# Patient Record
Sex: Female | Born: 1935 | Race: White | Hispanic: No | Smoking: Never smoker
Health system: Southern US, Community
[De-identification: ages and names within clinical notes are randomized; demographics above are authoritative.]

## PROBLEM LIST (undated history)

## (undated) DIAGNOSIS — K5790 Diverticulosis of intestine, part unspecified, without perforation or abscess without bleeding: Secondary | ICD-10-CM

## (undated) DIAGNOSIS — H269 Unspecified cataract: Secondary | ICD-10-CM

## (undated) DIAGNOSIS — E039 Hypothyroidism, unspecified: Secondary | ICD-10-CM

## (undated) DIAGNOSIS — J449 Chronic obstructive pulmonary disease, unspecified: Secondary | ICD-10-CM

## (undated) DIAGNOSIS — J849 Interstitial pulmonary disease, unspecified: Secondary | ICD-10-CM

## (undated) DIAGNOSIS — K219 Gastro-esophageal reflux disease without esophagitis: Secondary | ICD-10-CM

## (undated) DIAGNOSIS — M1990 Unspecified osteoarthritis, unspecified site: Secondary | ICD-10-CM

## (undated) DIAGNOSIS — R05 Cough: Secondary | ICD-10-CM

## (undated) DIAGNOSIS — K449 Diaphragmatic hernia without obstruction or gangrene: Secondary | ICD-10-CM

## (undated) DIAGNOSIS — K5792 Diverticulitis of intestine, part unspecified, without perforation or abscess without bleeding: Secondary | ICD-10-CM

## (undated) DIAGNOSIS — R053 Chronic cough: Secondary | ICD-10-CM

## (undated) DIAGNOSIS — K760 Fatty (change of) liver, not elsewhere classified: Secondary | ICD-10-CM

## (undated) DIAGNOSIS — E119 Type 2 diabetes mellitus without complications: Secondary | ICD-10-CM

## (undated) DIAGNOSIS — J8410 Pulmonary fibrosis, unspecified: Secondary | ICD-10-CM

## (undated) DIAGNOSIS — E079 Disorder of thyroid, unspecified: Secondary | ICD-10-CM

## (undated) DIAGNOSIS — R42 Dizziness and giddiness: Secondary | ICD-10-CM

## (undated) HISTORY — DX: Dizziness and giddiness: R42

## (undated) HISTORY — PX: REPLACEMENT TOTAL KNEE BILATERAL: SUR1225

## (undated) HISTORY — DX: Chronic obstructive pulmonary disease, unspecified: J44.9

## (undated) HISTORY — PX: SHOULDER SURGERY: SHX246

## (undated) HISTORY — PX: APPENDECTOMY: SHX54

## (undated) HISTORY — DX: Diverticulosis of intestine, part unspecified, without perforation or abscess without bleeding: K57.90

## (undated) HISTORY — DX: Fatty (change of) liver, not elsewhere classified: K76.0

## (undated) HISTORY — DX: Diverticulitis of intestine, part unspecified, without perforation or abscess without bleeding: K57.92

## (undated) HISTORY — DX: Diaphragmatic hernia without obstruction or gangrene: K44.9

---

## 1995-02-05 HISTORY — PX: INTRAOCULAR LENS INSERTION: SHX110

## 2004-05-01 ENCOUNTER — Emergency Department (HOSPITAL_COMMUNITY): Admission: EM | Admit: 2004-05-01 | Discharge: 2004-05-02 | Payer: Self-pay | Admitting: Emergency Medicine

## 2005-05-28 IMAGING — CR DG FOREARM 2V*R*
2 series · 2 of 2 positions shown · non-contrast
Comparison: none

CLINICAL DATA: fall
 RIGHT [id number] VIEWS:

[view not recorded (1 of 2)]
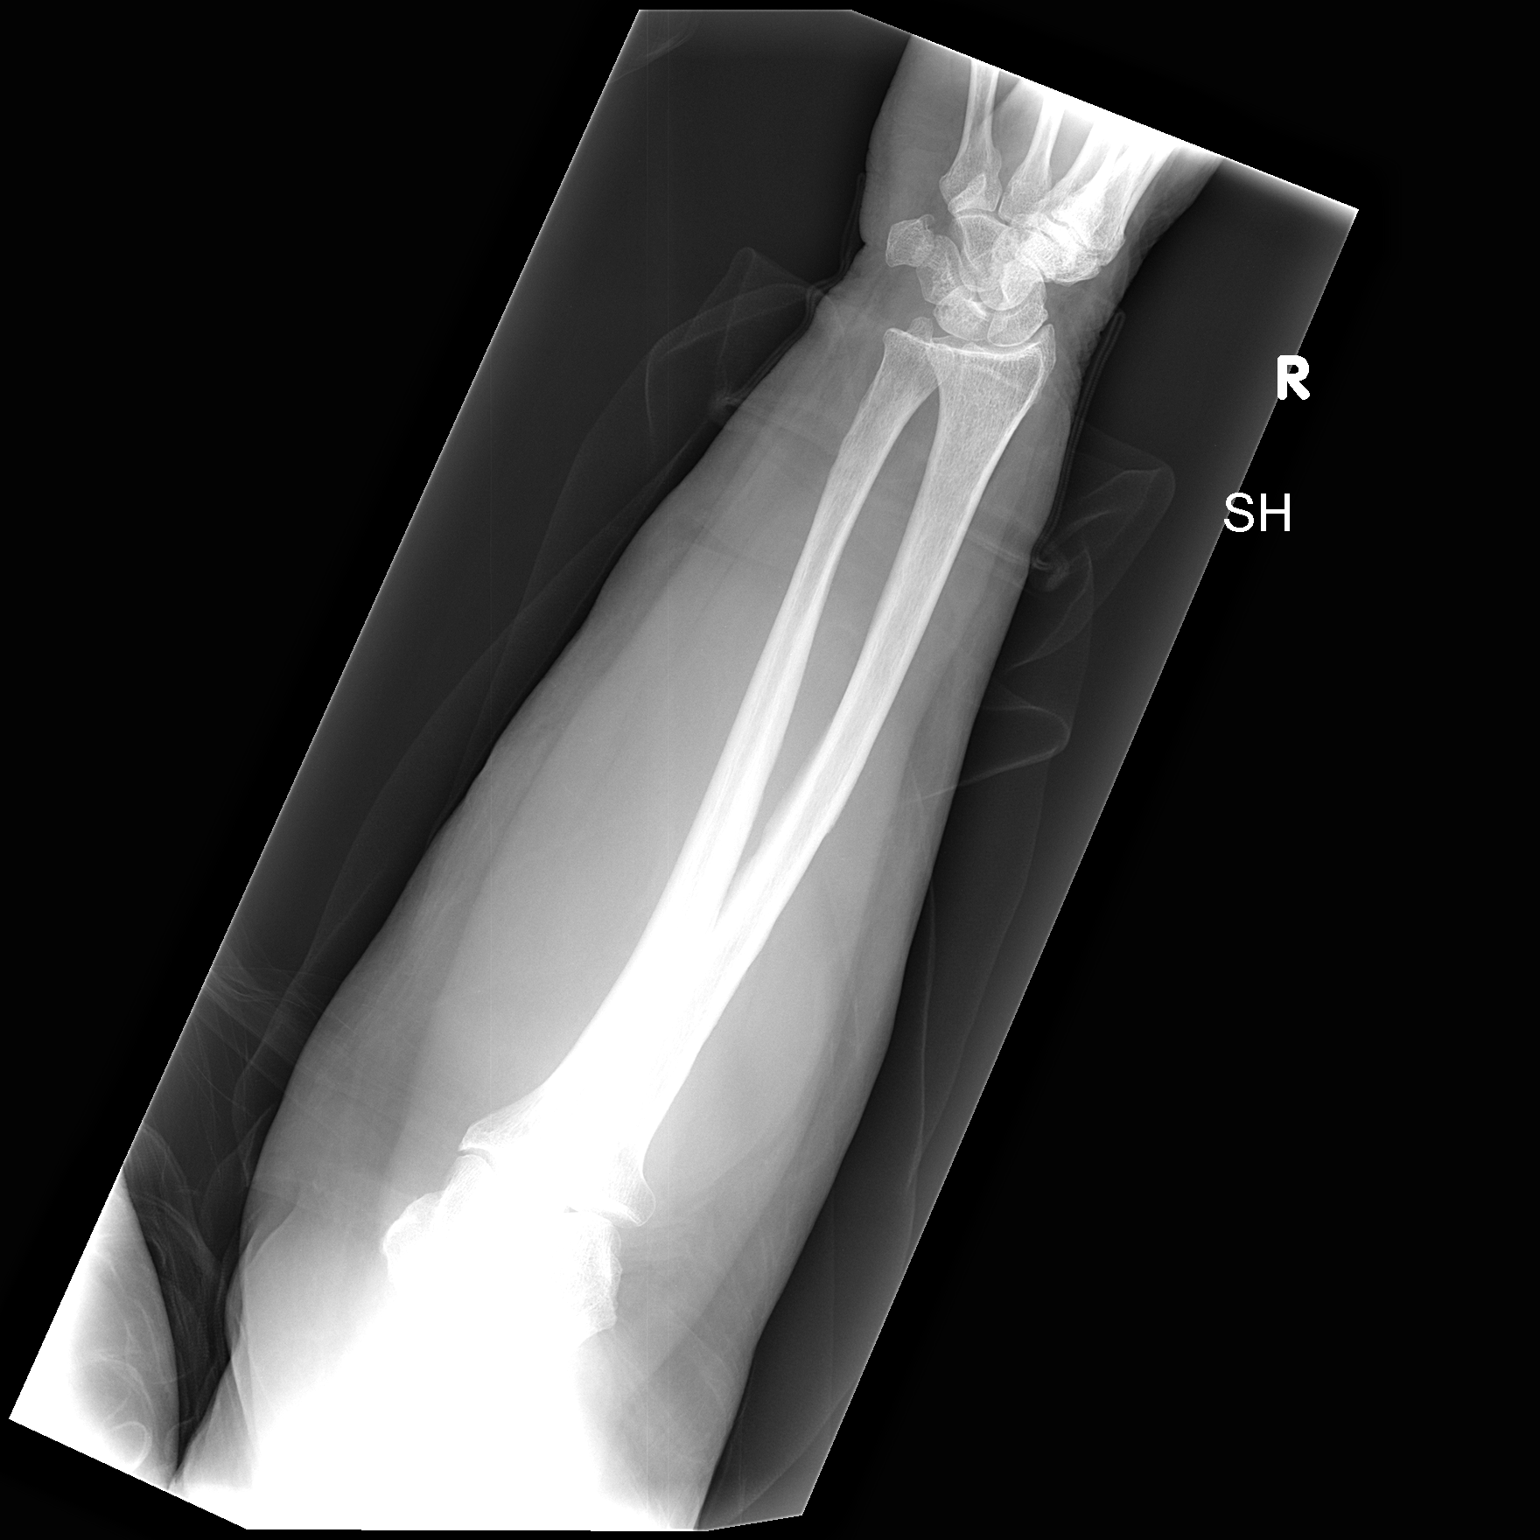

[view not recorded (2 of 2)]
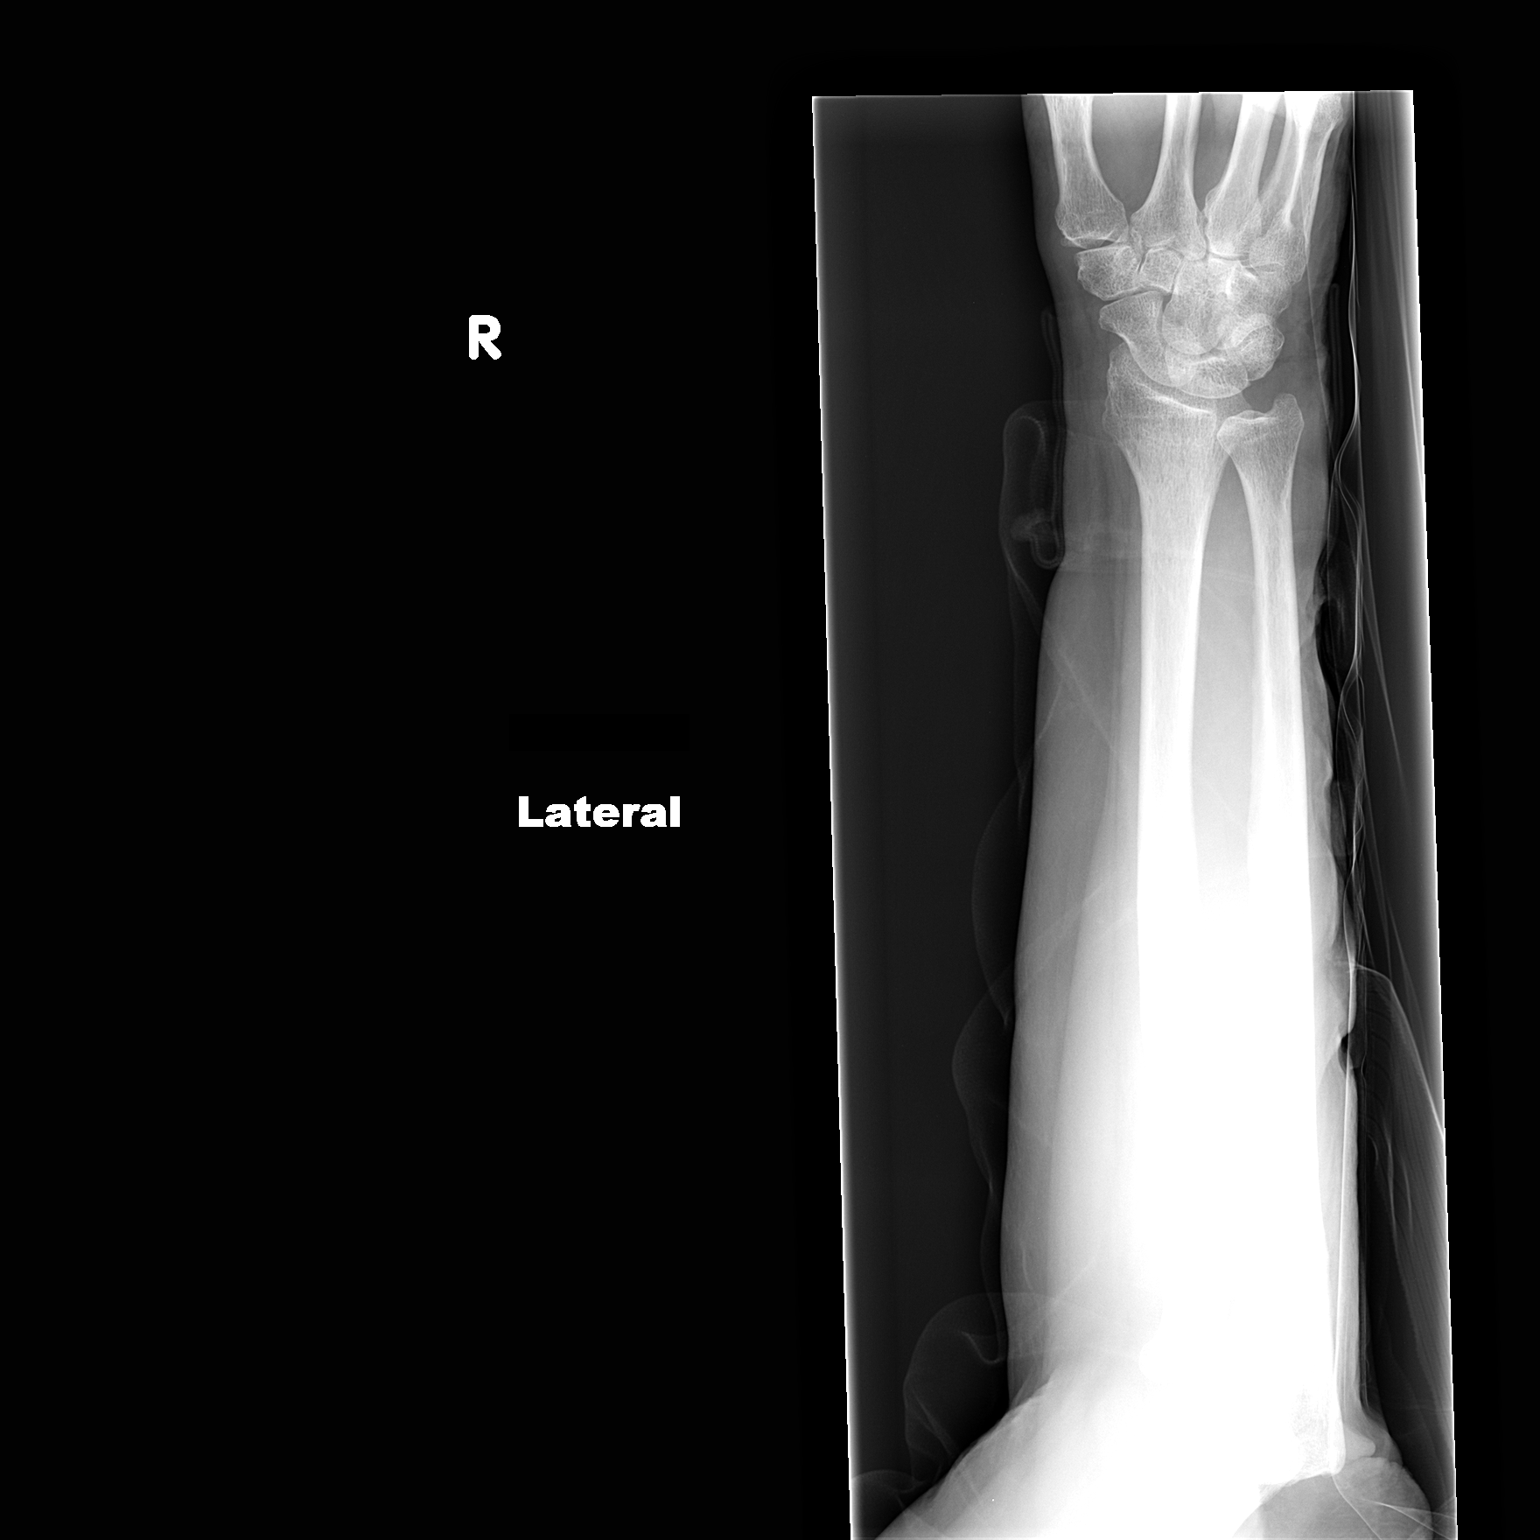

[2 of 2 positions shown; findings below may reference images not displayed]

FINDINGS: No acute fractures or dislocations are seen.
IMPRESSION: No acute fractures or dislocations are seen.

## 2005-05-28 IMAGING — CR DG HUMERUS 2V *R*
2 series · 2 of 2 positions shown · non-contrast
Comparison: none

CLINICAL DATA: Fall.  
 RIGHT HUMERUS ? 2 VIEW ([DATE] HOURS):

[view not recorded (1 of 2)]
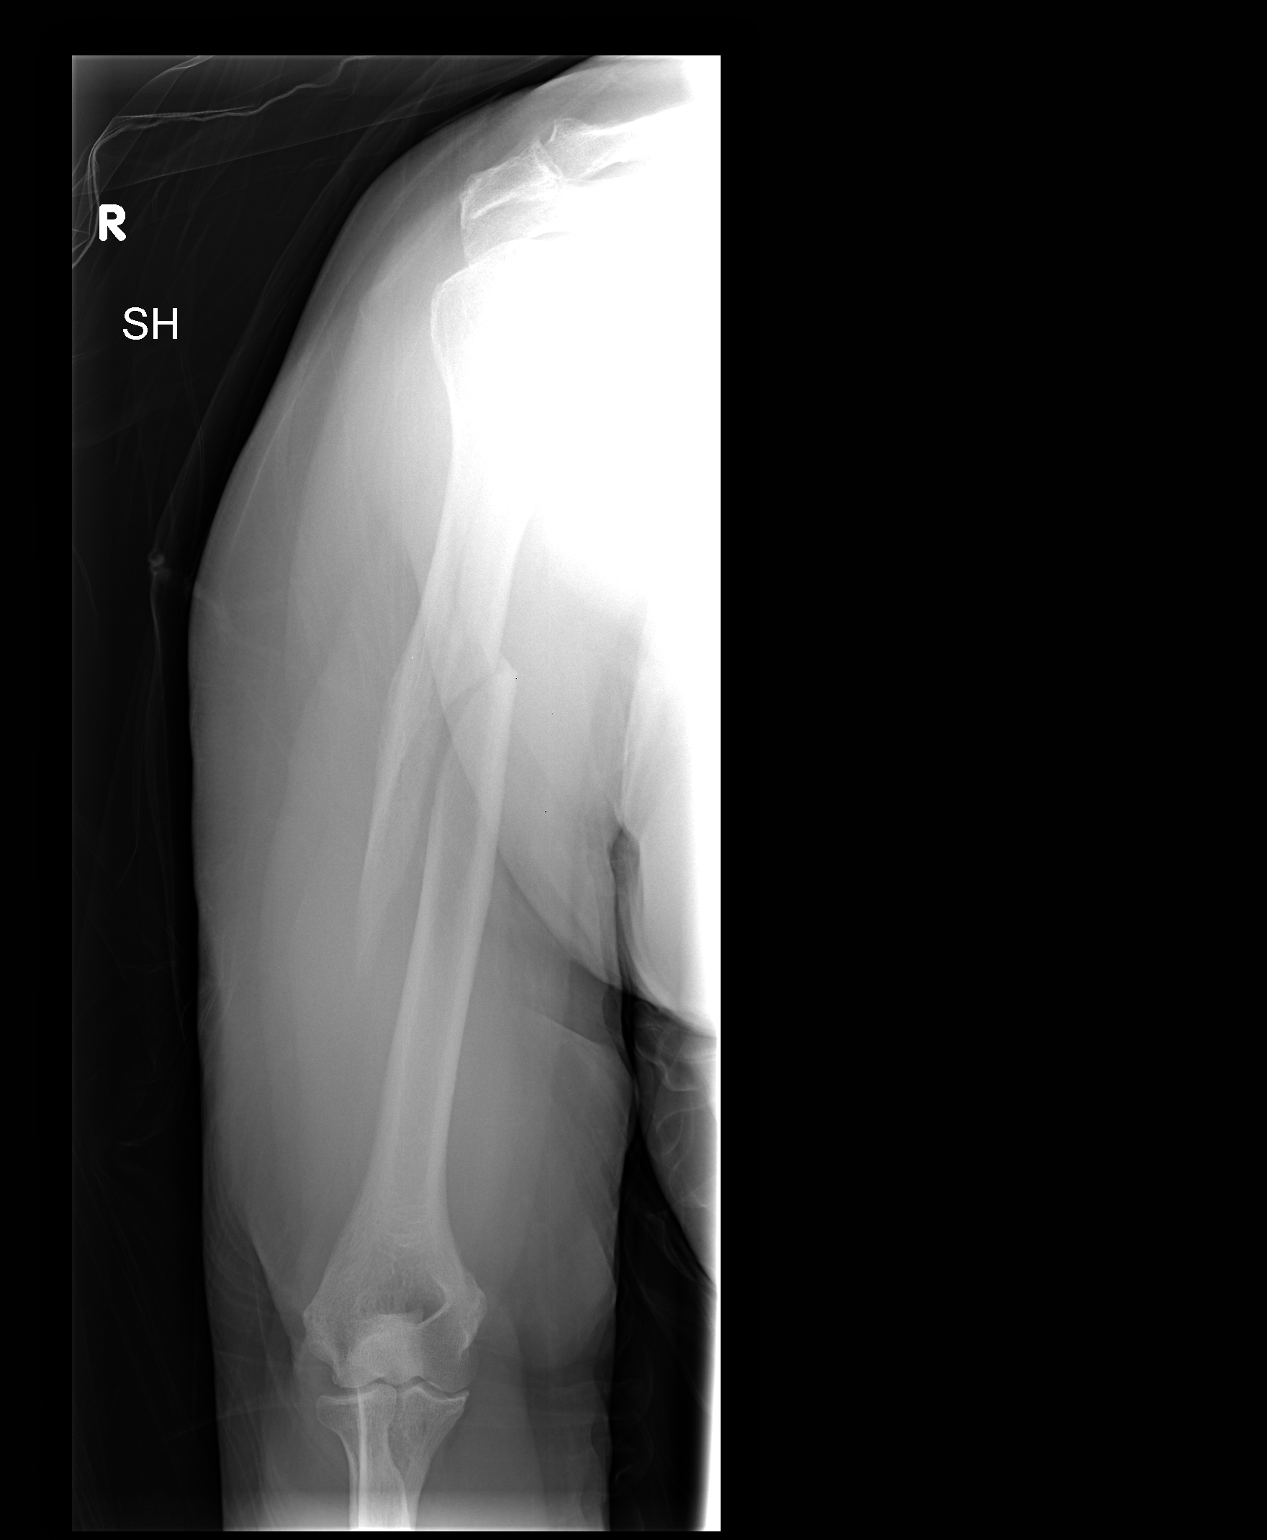

[view not recorded (2 of 2)]
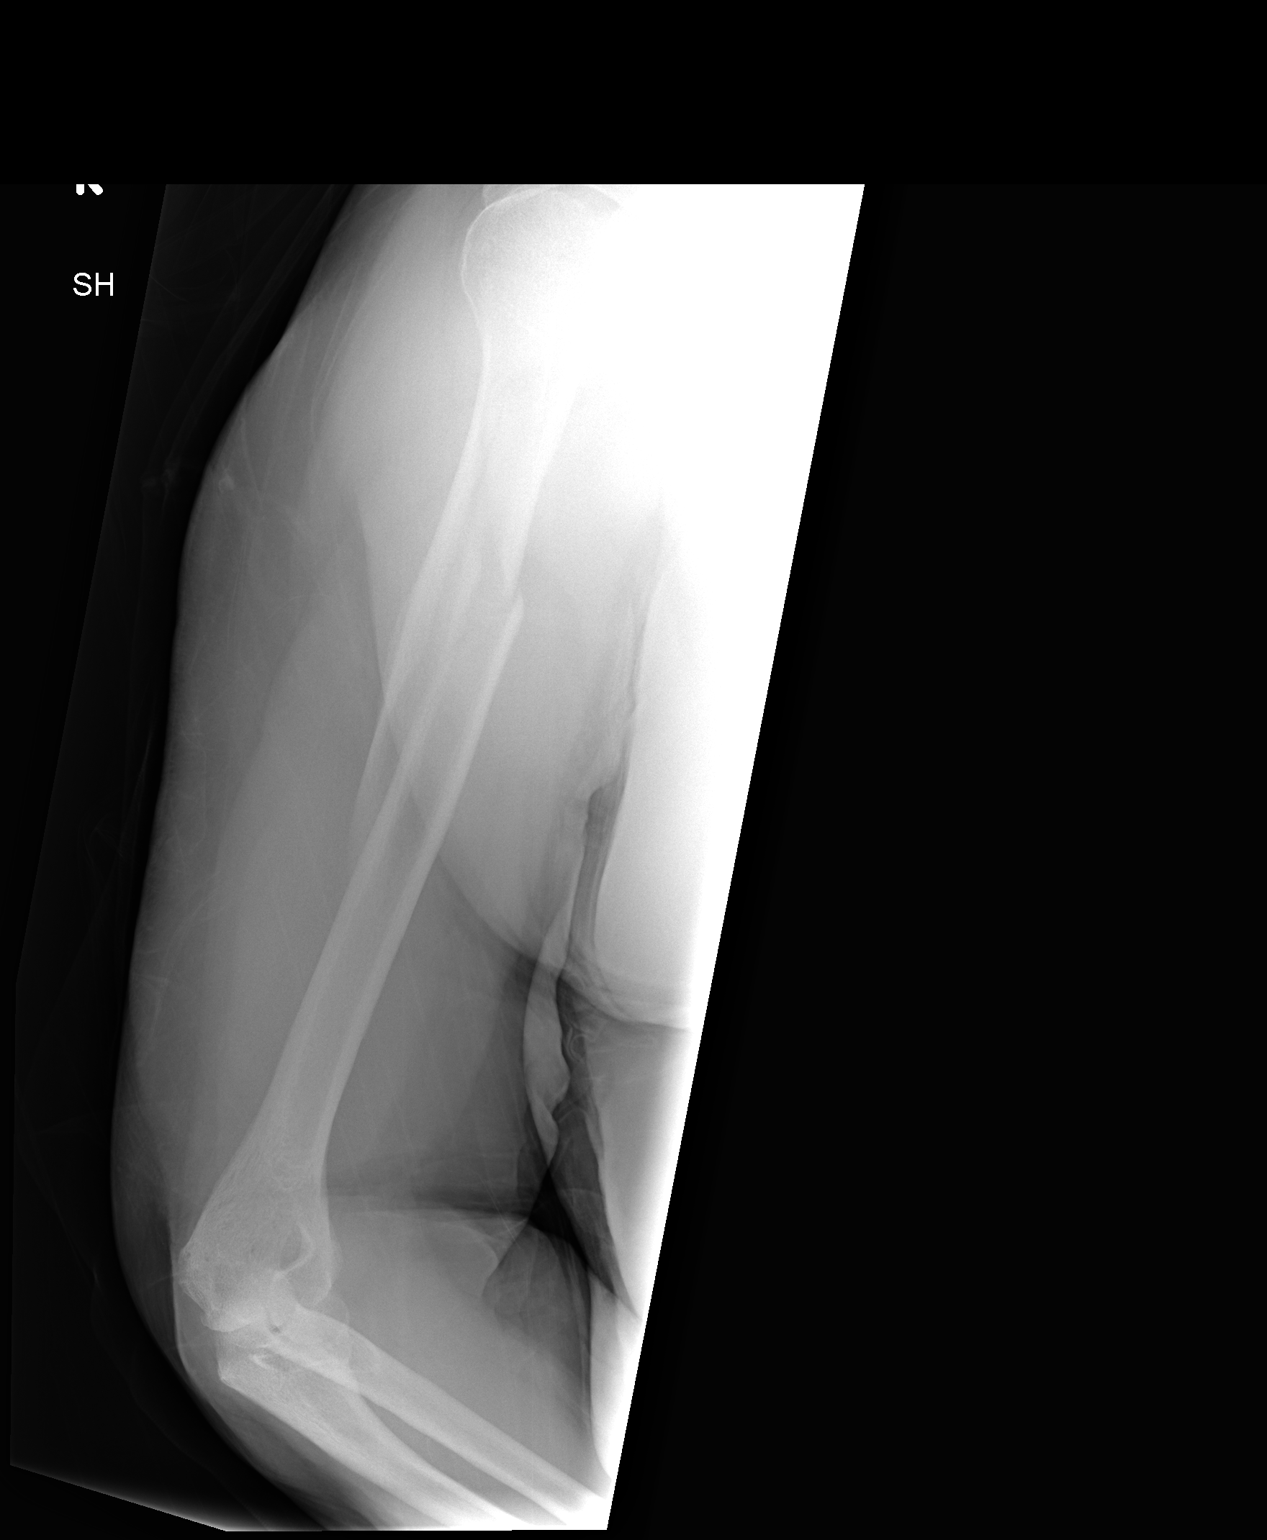

[2 of 2 positions shown; findings below may reference images not displayed]

FINDINGS: There is a spiral fracture of the midshaft of the humerus.  There is marked displacement of the fracture fragments and slight lateral angulation.
IMPRESSION: Spiral fracture of the midshaft of the humerus with displacement.

## 2014-04-29 ENCOUNTER — Emergency Department (HOSPITAL_COMMUNITY): Payer: Medicare Other

## 2014-04-29 ENCOUNTER — Inpatient Hospital Stay (HOSPITAL_COMMUNITY): Payer: Medicare Other

## 2014-04-29 ENCOUNTER — Inpatient Hospital Stay (HOSPITAL_COMMUNITY)
Admission: EM | Admit: 2014-04-29 | Discharge: 2014-05-01 | DRG: 391 | Disposition: A | Discharge status: Home / Self Care | Payer: Medicare Other | Attending: Internal Medicine | Admitting: Internal Medicine

## 2014-04-29 ENCOUNTER — Other Ambulatory Visit (HOSPITAL_COMMUNITY): Payer: Self-pay

## 2014-04-29 ENCOUNTER — Encounter (HOSPITAL_COMMUNITY): Payer: Self-pay | Admitting: Emergency Medicine

## 2014-04-29 DIAGNOSIS — Z9049 Acquired absence of other specified parts of digestive tract: Secondary | ICD-10-CM | POA: Diagnosis present

## 2014-04-29 DIAGNOSIS — E039 Hypothyroidism, unspecified: Secondary | ICD-10-CM | POA: Diagnosis present

## 2014-04-29 DIAGNOSIS — J849 Interstitial pulmonary disease, unspecified: Secondary | ICD-10-CM | POA: Diagnosis present

## 2014-04-29 DIAGNOSIS — E875 Hyperkalemia: Secondary | ICD-10-CM | POA: Diagnosis present

## 2014-04-29 DIAGNOSIS — N390 Urinary tract infection, site not specified: Secondary | ICD-10-CM | POA: Diagnosis present

## 2014-04-29 DIAGNOSIS — E119 Type 2 diabetes mellitus without complications: Secondary | ICD-10-CM | POA: Diagnosis present

## 2014-04-29 DIAGNOSIS — N179 Acute kidney failure, unspecified: Secondary | ICD-10-CM | POA: Diagnosis present

## 2014-04-29 DIAGNOSIS — Z79899 Other long term (current) drug therapy: Secondary | ICD-10-CM

## 2014-04-29 DIAGNOSIS — J9601 Acute respiratory failure with hypoxia: Secondary | ICD-10-CM | POA: Diagnosis present

## 2014-04-29 DIAGNOSIS — K5733 Diverticulitis of large intestine without perforation or abscess with bleeding: Secondary | ICD-10-CM | POA: Diagnosis present

## 2014-04-29 DIAGNOSIS — K5792 Diverticulitis of intestine, part unspecified, without perforation or abscess without bleeding: Secondary | ICD-10-CM

## 2014-04-29 DIAGNOSIS — N3090 Cystitis, unspecified without hematuria: Secondary | ICD-10-CM | POA: Diagnosis not present

## 2014-04-29 DIAGNOSIS — Z96653 Presence of artificial knee joint, bilateral: Secondary | ICD-10-CM | POA: Diagnosis present

## 2014-04-29 DIAGNOSIS — R112 Nausea with vomiting, unspecified: Secondary | ICD-10-CM | POA: Diagnosis present

## 2014-04-29 DIAGNOSIS — K5732 Diverticulitis of large intestine without perforation or abscess without bleeding: Principal | ICD-10-CM | POA: Diagnosis present

## 2014-04-29 DIAGNOSIS — R0902 Hypoxemia: Secondary | ICD-10-CM

## 2014-04-29 DIAGNOSIS — Z8249 Family history of ischemic heart disease and other diseases of the circulatory system: Secondary | ICD-10-CM | POA: Diagnosis not present

## 2014-04-29 HISTORY — DX: Unspecified osteoarthritis, unspecified site: M19.90

## 2014-04-29 HISTORY — DX: Disorder of thyroid, unspecified: E07.9

## 2014-04-29 HISTORY — DX: Chronic cough: R05.3

## 2014-04-29 HISTORY — DX: Unspecified cataract: H26.9

## 2014-04-29 HISTORY — DX: Type 2 diabetes mellitus without complications: E11.9

## 2014-04-29 HISTORY — DX: Gastro-esophageal reflux disease without esophagitis: K21.9

## 2014-04-29 HISTORY — DX: Cough: R05

## 2014-04-29 HISTORY — DX: Hypothyroidism, unspecified: E03.9

## 2014-04-29 LAB — CBC WITH DIFFERENTIAL/PLATELET
Basophils Absolute: 0 10*3/uL (ref 0.0–0.1)
Basophils Relative: 0 % (ref 0–1)
EOS ABS: 0.3 10*3/uL (ref 0.0–0.7)
EOS PCT: 3 % (ref 0–5)
HCT: 40 % (ref 36.0–46.0)
HEMOGLOBIN: 13.6 g/dL (ref 12.0–15.0)
LYMPHS ABS: 1.9 10*3/uL (ref 0.7–4.0)
Lymphocytes Relative: 19 % (ref 12–46)
MCH: 32.3 pg (ref 26.0–34.0)
MCHC: 34 g/dL (ref 30.0–36.0)
MCV: 95 fL (ref 78.0–100.0)
MONOS PCT: 9 % (ref 3–12)
Monocytes Absolute: 0.9 10*3/uL (ref 0.1–1.0)
Neutro Abs: 6.9 10*3/uL (ref 1.7–7.7)
Neutrophils Relative %: 69 % (ref 43–77)
Platelets: 288 10*3/uL (ref 150–400)
RBC: 4.21 MIL/uL (ref 3.87–5.11)
RDW: 12.6 % (ref 11.5–15.5)
WBC: 10 10*3/uL (ref 4.0–10.5)

## 2014-04-29 LAB — COMPREHENSIVE METABOLIC PANEL
ALBUMIN: 3.8 g/dL (ref 3.5–5.2)
ALK PHOS: 57 U/L (ref 39–117)
ALT: 17 U/L (ref 0–35)
ALT: 19 U/L (ref 0–35)
ANION GAP: 12 (ref 5–15)
AST: 35 U/L (ref 0–37)
AST: 24 U/L (ref 0–37)
Albumin: 3.5 g/dL (ref 3.5–5.2)
Alkaline Phosphatase: 54 U/L (ref 39–117)
Anion gap: 10 (ref 5–15)
BILIRUBIN TOTAL: 0.9 mg/dL (ref 0.3–1.2)
BILIRUBIN TOTAL: 0.3 mg/dL (ref 0.3–1.2)
BUN: 18 mg/dL (ref 6–23)
BUN: 16 mg/dL (ref 6–23)
CALCIUM: 8.5 mg/dL (ref 8.4–10.5)
CHLORIDE: 104 mmol/L (ref 96–112)
CHLORIDE: 107 mmol/L (ref 96–112)
CO2: 18 mmol/L — AB (ref 19–32)
CO2: 20 mmol/L (ref 19–32)
CREATININE: 1.12 mg/dL — AB (ref 0.50–1.10)
Calcium: 8.9 mg/dL (ref 8.4–10.5)
Creatinine, Ser: 1.36 mg/dL — ABNORMAL HIGH (ref 0.50–1.10)
GFR calc Af Amer: 38 mL/min — ABNORMAL LOW (ref >90)
GFR calc Af Amer: 49 mL/min — ABNORMAL LOW (ref >90)
GFR calc non Af Amer: 42 mL/min — ABNORMAL LOW (ref >90)
GFR, EST NON AFRICAN AMERICAN: 33 mL/min — AB (ref >90)
GLUCOSE: 156 mg/dL — AB (ref 70–99)
Glucose, Bld: 176 mg/dL — ABNORMAL HIGH (ref 70–99)
POTASSIUM: 5.4 mmol/L — AB (ref 3.5–5.1)
Potassium: 4.6 mmol/L (ref 3.5–5.1)
SODIUM: 134 mmol/L — AB (ref 135–145)
Sodium: 137 mmol/L (ref 135–145)
Total Protein: 7.4 g/dL (ref 6.0–8.3)
Total Protein: 6.7 g/dL (ref 6.0–8.3)

## 2014-04-29 LAB — URINALYSIS, ROUTINE W REFLEX MICROSCOPIC
BILIRUBIN URINE: NEGATIVE
GLUCOSE, UA: NEGATIVE mg/dL
Hgb urine dipstick: NEGATIVE
KETONES UR: NEGATIVE mg/dL
Nitrite: NEGATIVE
PH: 6 (ref 5.0–8.0)
Protein, ur: NEGATIVE mg/dL
SPECIFIC GRAVITY, URINE: 1.017 (ref 1.005–1.030)
Urobilinogen, UA: 0.2 mg/dL (ref 0.0–1.0)

## 2014-04-29 LAB — GLUCOSE, CAPILLARY
Glucose-Capillary: 140 mg/dL — ABNORMAL HIGH (ref 70–99)
Glucose-Capillary: 95 mg/dL (ref 70–99)

## 2014-04-29 LAB — TSH: TSH: 0.031 u[IU]/mL — AB (ref 0.350–4.500)

## 2014-04-29 LAB — URINE MICROSCOPIC-ADD ON

## 2014-04-29 LAB — MAGNESIUM: MAGNESIUM: 1.7 mg/dL (ref 1.5–2.5)

## 2014-04-29 LAB — PHOSPHORUS: PHOSPHORUS: 2.8 mg/dL (ref 2.3–4.6)

## 2014-04-29 LAB — LIPASE, BLOOD: LIPASE: 17 U/L (ref 11–59)

## 2014-04-29 LAB — TROPONIN I: Troponin I: <0.03 ng/mL (ref <0.031)

## 2014-04-29 MED ORDER — FENTANYL CITRATE 0.05 MG/ML IJ SOLN
50.0000 ug | Freq: Once | INTRAMUSCULAR | Status: AC
  Administered 2014-04-29: 50 ug via INTRAVENOUS
  Filled 2014-04-29: qty 2

## 2014-04-29 MED ORDER — PROMETHAZINE HCL 25 MG/ML IJ SOLN
12.5000 mg | Freq: Once | INTRAMUSCULAR | Status: AC
  Administered 2014-04-29: 25 mg via INTRAVENOUS
  Filled 2014-04-29: qty 1

## 2014-04-29 MED ORDER — ONDANSETRON HCL 4 MG/2ML IJ SOLN
4.0000 mg | Freq: Once | INTRAMUSCULAR | Status: AC
  Administered 2014-04-29: 4 mg via INTRAVENOUS
  Filled 2014-04-29: qty 2

## 2014-04-29 MED ORDER — METRONIDAZOLE IN NACL 5-0.79 MG/ML-% IV SOLN
500.0000 mg | Freq: Once | INTRAVENOUS | Status: AC
  Administered 2014-04-29: 500 mg via INTRAVENOUS
  Filled 2014-04-29: qty 100

## 2014-04-29 MED ORDER — SODIUM CHLORIDE 0.9 % IV BOLUS (SEPSIS)
1000.0000 mL | Freq: Once | INTRAVENOUS | Status: AC
  Administered 2014-04-29: 1000 mL via INTRAVENOUS

## 2014-04-29 MED ORDER — MORPHINE SULFATE 2 MG/ML IJ SOLN
2.0000 mg | Freq: Once | INTRAMUSCULAR | Status: AC
  Administered 2014-04-29: 2 mg via INTRAVENOUS
  Filled 2014-04-29: qty 1

## 2014-04-29 MED ORDER — IPRATROPIUM-ALBUTEROL 0.5-2.5 (3) MG/3ML IN SOLN: 3.0000 mL | RESPIRATORY_TRACT | Status: DC | PRN

## 2014-04-29 MED ORDER — ZOLPIDEM TARTRATE 5 MG PO TABS: 2.5000 mg | ORAL_TABLET | Freq: Every evening | ORAL | Status: DC | PRN

## 2014-04-29 MED ORDER — METRONIDAZOLE IN NACL 5-0.79 MG/ML-% IV SOLN
500.0000 mg | Freq: Three times a day (TID) | INTRAVENOUS | Status: DC
  Administered 2014-04-29 – 2014-04-30 (×2): 500 mg via INTRAVENOUS
  Filled 2014-04-29 (×3): qty 100

## 2014-04-29 MED ORDER — PROMETHAZINE HCL 25 MG PO TABS: 12.5000 mg | ORAL_TABLET | Freq: Four times a day (QID) | ORAL | Status: DC | PRN

## 2014-04-29 MED ORDER — SODIUM CHLORIDE 0.9 % IV SOLN
INTRAVENOUS | Status: DC
  Administered 2014-04-29: 19:00:00 via INTRAVENOUS

## 2014-04-29 MED ORDER — FENTANYL CITRATE 0.05 MG/ML IJ SOLN: 50.0000 ug | INTRAMUSCULAR | Status: DC | PRN

## 2014-04-29 MED ORDER — LEVOTHYROXINE SODIUM 125 MCG PO TABS
125.0000 ug | ORAL_TABLET | Freq: Every day | ORAL | Status: DC
  Administered 2014-04-30: 125 ug via ORAL
  Filled 2014-04-29 (×2): qty 1

## 2014-04-29 MED ORDER — CIPROFLOXACIN IN D5W 400 MG/200ML IV SOLN
400.0000 mg | Freq: Once | INTRAVENOUS | Status: AC
  Administered 2014-04-29: 400 mg via INTRAVENOUS
  Filled 2014-04-29: qty 200

## 2014-04-29 MED ORDER — CIPROFLOXACIN IN D5W 400 MG/200ML IV SOLN
400.0000 mg | Freq: Two times a day (BID) | INTRAVENOUS | Status: DC
  Administered 2014-04-29 – 2014-04-30 (×2): 400 mg via INTRAVENOUS
  Filled 2014-04-29 (×2): qty 200

## 2014-04-29 MED ORDER — PROMETHAZINE HCL 25 MG/ML IJ SOLN
12.5000 mg | Freq: Once | INTRAMUSCULAR | Status: DC
  Filled 2014-04-29: qty 1

## 2014-04-29 MED ORDER — MORPHINE SULFATE 2 MG/ML IJ SOLN
2.0000 mg | Freq: Once | INTRAMUSCULAR | Status: DC
  Filled 2014-04-29: qty 1

## 2014-04-29 NOTE — ED Notes (Signed)
Daughter at bedside reports pt intermittent abdominal pain for two weeks.

## 2014-04-29 NOTE — ED Notes (Signed)
Bed: WA20 Expected date:  Expected time:  Means of arrival:  Comments: EMS-N/V 

## 2014-04-29 NOTE — ED Provider Notes (Signed)
CSN: [id number]     Arrival date & time 04/29/14  0819 History   First MD Initiated Contact with Patient 04/29/14 0820     Chief Complaint  Patient presents with  . Emesis     The history is provided by the patient and a relative. No language interpreter was used.   Shelia Black presents for evaluation of abdominal pain and vomiting.  She started bactrim for UTI a few days ago (self prescribed).  She has diet controled DM and hypothyroidism.    History is provided by patient and her daughter. She's had 1 week of intermittent upper abdominal pain described as a discomfort. Around 3 this morning she developed profuse vomiting. Emesis is nonbloody. She denies any diarrhea or constipation last bowel movement was yesterday. She denies any fevers, cough, chest pain, shortness of breath, dysuria. She has a history of appendectomy.  Past Medical History  Diagnosis Date  . Thyroid disease   . Diabetes mellitus without complication    History reviewed. No pertinent past surgical history. No family history on file. History  Substance Use Topics  . Smoking status: Not on file  . Smokeless tobacco: Not on file  . Alcohol Use: No   OB History    No data available     Review of Systems  All other systems reviewed and are negative.     Allergies  Penicillins and Percocet  Home Medications   Prior to Admission medications   Not on File   BP 118/52 mmHg  Pulse 72  Temp(Src) 97.6 F (36.4 C) (Oral)  Resp 20  SpO2 94% Physical Exam  Constitutional: She is oriented to person, place, and time. She appears well-developed and well-nourished.  Mild distress  HENT:  Head: Normocephalic and atraumatic.  Cardiovascular: Normal rate and regular rhythm.   No murmur heard. Pulmonary/Chest: Effort normal and breath sounds normal. No respiratory distress.  Abdominal: Soft. There is no tenderness. There is no rebound and no guarding.  Musculoskeletal: She exhibits no edema or tenderness.   Neurological: She is alert and oriented to person, place, and time.  Skin: Skin is warm and dry.  Psychiatric: She has a normal mood and affect. Her behavior is normal.  Nursing note and vitals reviewed.   ED Course  Procedures (including critical care time) Labs Review Labs Reviewed  COMPREHENSIVE METABOLIC PANEL - Abnormal; Notable for the following:    Sodium 134 (*)    Potassium 5.4 (*)    CO2 18 (*)    Glucose, Bld 156 (*)    Creatinine, Ser 1.36 (*)    GFR calc non Af Amer 33 (*)    GFR calc Af Amer 38 (*)    All other components within normal limits  URINALYSIS, ROUTINE W REFLEX MICROSCOPIC - Abnormal; Notable for the following:    Leukocytes, UA TRACE (*)    All other components within normal limits  URINE MICROSCOPIC-ADD ON - Abnormal; Notable for the following:    Squamous Epithelial / LPF FEW (*)    All other components within normal limits  COMPREHENSIVE METABOLIC PANEL - Abnormal; Notable for the following:    Glucose, Bld 176 (*)    Creatinine, Ser 1.12 (*)    GFR calc non Af Amer 42 (*)    GFR calc Af Amer 49 (*)    All other components within normal limits  GLUCOSE, CAPILLARY - Abnormal; Notable for the following:    Glucose-Capillary 140 (*)    All other components  within normal limits  URINE CULTURE  CBC WITH DIFFERENTIAL/PLATELET  LIPASE, BLOOD  TROPONIN I  MAGNESIUM  PHOSPHORUS  TSH    Imaging Review Ct Abdomen Pelvis Wo Contrast  04/29/2014   CLINICAL DATA:  Abdominal pain for 2 weeks  EXAM: CT ABDOMEN AND PELVIS WITHOUT CONTRAST  TECHNIQUE: Multidetector CT imaging of the abdomen and pelvis was performed following the standard protocol without IV contrast.  COMPARISON:  None.  FINDINGS: Lung bases demonstrate some fibrotic changes in the lower lobes bilaterally. No focal confluent infiltrate or sizable effusion is seen. Tiny 3 mm nodule is noted in the right middle lobe best seen on the first image of series 4.  The liver, gallbladder, spleen,  adrenal glands and pancreas are within normal limits. A sliding-type hiatal hernia is noted. Kidneys are well visualized and demonstrate no renal calculi or obstructive change. The bladder is well distended. A few small hypodensities are noted within the kidneys but incompletely characterized. These may simply represent cysts.  The appendix has been surgically removed. Colonic diverticulosis is noted. There are changes consistent with mild diverticulitis in the mid sigmoid. No perforation or abscess is seen. No free pelvic fluid is noted. Degenerative changes of the lumbar spine  IMPRESSION: Diverticulitis in the mid sigmoid without complicating factors.  Chronic changes as described above.   Electronically Signed   By: Inez Catalina M.D.   On: 04/29/2014 10:52   Dg Chest Port 1 View  04/29/2014   CLINICAL DATA:  Hypoxia.  EXAM: PORTABLE CHEST - 1 VIEW  COMPARISON:  CT scan abdomen dated 04/29/2014  FINDINGS: Heart size and pulmonary vascularity are normal. There is diffuse accentuation of the interstitial markings consistent with chronic interstitial lung disease. The appearance on CT scan suggests interstitial fibrosis.  No infiltrates or effusions.  No acute osseous abnormality.  IMPRESSION: Chronic interstitial lung disease, probably interstitial fibrosis.   Electronically Signed   By: Lorriane Shire M.D.   On: 04/29/2014 13:59     EKG Interpretation None      MDM   Final diagnoses:  Diverticulitis of large intestine without perforation or abscess without bleeding    Patient here for evaluation of a week of abdominal pain, vomiting started today. Patient with recurrent vomiting in the emergency department. CT abdomen is consistent with acute diverticulitis. Providing IV antibiotics with admission to medicine given persistent pain and vomiting.  Quintella Reichert, MD 04/29/14 1504

## 2014-04-29 NOTE — ED Notes (Signed)
Pt continues to be drowsy but alert and oriented x4. Pt denies nausea or pain at present time.

## 2014-04-29 NOTE — H&P (Addendum)
[facility] History and Physical  [name] VOZ:[id number]:[id number] DOB: 04/11/1921 DOA: 04/29/2014  Referring physician: ER physician PCP: No primary care provider on file.   Chief Complaint: lower abdominal pain, nausea and vomiting  HPI:  81 year old female with past medical history of hypothyroidism who [facility] with worsening abdominal pain in lower abdominal area associated with ongoing nausea and non bloody vomiting. Abdominal pain is 7/10 in intensity, non radiating, constant and not relieved with morphine given in ED. She has had this pain for past few days but getting worse in last 24 hours with severe nausea. No fevers or chills. No diarrhea or constipation. No chest pain, shortness of breath, palpitations. No lightheadedness or loss of consciousness.   In ED, BP was 118/52, HR 75, T max 98 F and oxygen saturation was 87% on room air but has improved to 98% with [place] oxygen support. CT abdomen showed diverticulitis in the mid sigmoid region without complicating factor. UA showed trace leukocytes. She was started on cipro and flagyl.    Assessment & Plan    Principal Problem:   Diverticulitis / abdominal pain, nausea and vomiting - Pt admitted with nausea and vomiting, abdominal pain. - CT abdomen showed sigmoid diverticulitis without complicating factors - Started cipro and flagyl - Continue supportive care with IV fluids, analgesia and antiemetics as needed   Active Problems:   UTI (urinary tract infection) - UA showed trace leukocytes - She is already on cipor and flagyl for diverticulitis which would cover for UTI  Hypothyroidism - Continue synthroid - Check TSH    Acute respiratory failure with hypoxia - Unclear etiology - Obtain CXR - Respiratory status stable - Use duoneb every 4 hours as needed for shortness of breath     Acute renal failure - no other labs available for comparison - Creatinine was 1.12 on admission - Continue IV fluids -  Follow up BMP tomorrow am    Hyperkalemia - unclear etiology - repeat BMP revealed normal potassium    DVT prophylaxis:  - SCD's bilaterally   Radiological Exams on Admission: Ct Abdomen Pelvis Wo Contrast 04/29/2014  Diverticulitis in the mid sigmoid without complicating factors.  Chronic changes as described above.     Code Status: Full Family Communication: Plan of care discussed with the patient  Disposition Plan: Admit for further evaluation  Shelia PasseyEVINE, Shelia Helling, MD  [facility] Pager [phone]  Review of Systems:  Constitutional: Negative for fever, chills and malaise/fatigue. Negative for diaphoresis.  HENT: Negative for hearing loss, ear pain, nosebleeds, congestion, sore throat, neck pain, tinnitus and ear discharge.   Eyes: Negative for blurred vision, double vision, photophobia, pain, discharge and redness.  Respiratory: Negative for cough, hemoptysis, sputum production, shortness of breath, wheezing and stridor.   Cardiovascular: Negative for chest pain, palpitations, orthopnea, claudication and leg swelling.  Gastrointestinal: per HPI  Genitourinary: Negative for dysuria, urgency, frequency, hematuria and flank pain.  Musculoskeletal: Negative for myalgias, back pain, joint pain and falls.  Skin: Negative for itching and rash.  Neurological: Negative for dizziness and weakness. Negative for tingling, tremors, sensory change, speech change, focal weakness, loss of consciousness and headaches.  Endo/Heme/Allergies: Negative for environmental allergies and polydipsia. Does not bruise/bleed easily.  Psychiatric/Behavioral: Negative for suicidal ideas. The patient is not nervous/anxious.      Past Medical History  Diagnosis Date  . Thyroid disease   . Diabetes mellitus without complication   . Cataract   . Chronic cough   .  Diverticular disease    Past Surgical History  Procedure Laterality Date  . Appendectomy    . Replacement total knee bilateral     Social  History:  reports that she does not drink alcohol or use illicit drugs. Her tobacco history is not on file.  Allergies  Allergen Reactions  . Penicillins   . Percocet [Oxycodone-Acetaminophen]     Family History: HTN in parents    Prior to Admission medications   Medication Sig Start Date End Date Taking? Authorizing Provider  ibuprofen (ADVIL,MOTRIN) 200 MG tablet Take 800 mg by mouth every 4 (four) hours as needed for moderate pain.   Yes Historical Provider, MD  levothyroxine (SYNTHROID, LEVOTHROID) 125 MCG tablet Take 125 mcg by mouth daily before breakfast.   Yes Historical Provider, MD  Probiotic Product (ALIGN PO) Take 1 tablet by mouth daily.   Yes Historical Provider, MD  sulfamethoxazole-trimethoprim (BACTRIM DS,SEPTRA DS) 800-160 MG per tablet Take 1 tablet by mouth 2 (two) times daily as needed (for UTI).   Yes Historical Provider, MD  zolpidem (AMBIEN) 5 MG tablet Take 2.5 mg by mouth at bedtime as needed for sleep.   Yes Historical Provider, MD   Physical Exam: Filed Vitals:   04/29/14 1053 04/29/14 1200 04/29/14 1201 04/29/14 1254  BP: 125/55  131/52 140/65  Pulse: 67  75 66  Temp:    98 F (36.7 C)  TempSrc:    Oral  Resp: SpO2: 97% 87% 98% 98%    Physical Exam  Constitutional: Appears well-developed and well-nourished. No distress.  HENT: Normocephalic. No tonsillar erythema or exudates Eyes: Conjunctivae and EOM are normal. PERRLA, no scleral icterus.  Neck: Normal ROM. Neck supple. No JVD. No tracheal deviation. No thyromegaly.  CVS: RRR, S1/S2 +, no murmurs, no gallops, no carotid bruit.  Pulmonary: Effort and breath sounds normal, no stridor, rhonchi, wheezes, rales.  Abdominal: Soft. BS +,  no distension, tenderness, rebound or guarding.  Musculoskeletal: Normal range of motion. No edema and no tenderness.  Lymphadenopathy: No lymphadenopathy noted, cervical, inguinal. Neuro: Alert. Normal reflexes, muscle tone coordination. No focal  neurologic deficits. Skin: Skin is warm and dry. No rash noted.  No erythema. No pallor.  Psychiatric: Normal mood and affect. Behavior, judgment, thought content normal.   Labs on Admission:  Basic Metabolic Panel:  Recent Labs Lab 04/29/14 0832  NA 134*  K 5.4*  CL 104  CO2 18*  GLUCOSE 156*  BUN 18  CREATININE 1.36*  CALCIUM 8.9   Liver Function Tests:  Recent Labs Lab 04/29/14 0832  AST 35  ALT 17  ALKPHOS 57  BILITOT 0.9  PROT 7.4  ALBUMIN 3.8    Recent Labs Lab 04/29/14 0832  LIPASE 17   No results for input(s): AMMONIA in the last 168 hours. CBC:  Recent Labs Lab 04/29/14 0832  WBC 10.0  NEUTROABS 6.9  HGB 13.6  HCT 40.0  MCV 95.0  PLT 288   Cardiac Enzymes:  Recent Labs Lab 04/29/14 0832  TROPONINI <0.03   BNP: Invalid input(s): POCBNP CBG: No results for input(s): GLUCAP in the last 168 hours.  If 7PM-7AM, please contact night-coverage [email or web address] Password [facility] 04/29/2014, 1:07 PM

## 2014-04-29 NOTE — Progress Notes (Signed)
Utilization Review completed.  Joandy Burget RN CM  

## 2014-04-29 NOTE — ED Notes (Addendum)
Pt drowsy post medication administration. Pt alert answering all questions approximately. Pt denies pain at present time. Nurse tech handing EKG to EDP at present time.

## 2014-04-29 NOTE — ED Notes (Addendum)
Per EMS pt new onset n/v since 0200 today; denies diarrhea . Pt given 4 mg of zofran en route with EMS. Pt denies pain.

## 2014-04-30 DIAGNOSIS — N3090 Cystitis, unspecified without hematuria: Secondary | ICD-10-CM

## 2014-04-30 LAB — COMPREHENSIVE METABOLIC PANEL
ALK PHOS: 49 U/L (ref 39–117)
ALT: 16 U/L (ref 0–35)
AST: 19 U/L (ref 0–37)
Albumin: 3.2 g/dL — ABNORMAL LOW (ref 3.5–5.2)
Anion gap: 8 (ref 5–15)
BUN: 14 mg/dL (ref 6–23)
CALCIUM: 8.7 mg/dL (ref 8.4–10.5)
CHLORIDE: 108 mmol/L (ref 96–112)
CO2: 23 mmol/L (ref 19–32)
Creatinine, Ser: 1.06 mg/dL (ref 0.50–1.10)
GFR calc Af Amer: 52 mL/min — ABNORMAL LOW (ref >90)
GFR, EST NON AFRICAN AMERICAN: 45 mL/min — AB (ref >90)
GLUCOSE: 104 mg/dL — AB (ref 70–99)
POTASSIUM: 4 mmol/L (ref 3.5–5.1)
Sodium: 139 mmol/L (ref 135–145)
Total Bilirubin: 0.3 mg/dL (ref 0.3–1.2)
Total Protein: 6.2 g/dL (ref 6.0–8.3)

## 2014-04-30 LAB — CBC
HCT: 36.4 % (ref 36.0–46.0)
Hemoglobin: 11.8 g/dL — ABNORMAL LOW (ref 12.0–15.0)
MCH: 31.6 pg (ref 26.0–34.0)
MCHC: 32.4 g/dL (ref 30.0–36.0)
MCV: 97.3 fL (ref 78.0–100.0)
PLATELETS: 265 10*3/uL (ref 150–400)
RBC: 3.74 MIL/uL — ABNORMAL LOW (ref 3.87–5.11)
RDW: 12.9 % (ref 11.5–15.5)
WBC: 7.1 10*3/uL (ref 4.0–10.5)

## 2014-04-30 LAB — URINE CULTURE
CULTURE: NO GROWTH
Colony Count: NO GROWTH

## 2014-04-30 LAB — GLUCOSE, CAPILLARY: GLUCOSE-CAPILLARY: 109 mg/dL — AB (ref 70–99)

## 2014-04-30 MED ORDER — METRONIDAZOLE 500 MG PO TABS: 500.0000 mg | ORAL_TABLET | Freq: Three times a day (TID) | ORAL | Status: DC

## 2014-04-30 MED ORDER — ACETAMINOPHEN 325 MG PO TABS
650.0000 mg | ORAL_TABLET | Freq: Four times a day (QID) | ORAL | Status: DC | PRN
  Administered 2014-04-30: 650 mg via ORAL
  Filled 2014-04-30: qty 2

## 2014-04-30 MED ORDER — METRONIDAZOLE 500 MG PO TABS
500.0000 mg | ORAL_TABLET | Freq: Three times a day (TID) | ORAL | Status: DC
  Administered 2014-04-30 – 2014-05-01 (×3): 500 mg via ORAL
  Filled 2014-04-30 (×6): qty 1

## 2014-04-30 MED ORDER — LEVOTHYROXINE SODIUM 88 MCG PO TABS
88.0000 ug | ORAL_TABLET | Freq: Every day | ORAL | Status: DC
  Administered 2014-05-01: 88 ug via ORAL
  Filled 2014-04-30 (×2): qty 1

## 2014-04-30 MED ORDER — CIPROFLOXACIN HCL 500 MG PO TABS: 500.0000 mg | ORAL_TABLET | Freq: Two times a day (BID) | ORAL | Status: DC

## 2014-04-30 MED ORDER — LEVOTHYROXINE SODIUM 88 MCG PO TABS: 88.0000 ug | ORAL_TABLET | Freq: Every day | ORAL | Status: DC

## 2014-04-30 MED ORDER — CIPROFLOXACIN HCL 500 MG PO TABS
500.0000 mg | ORAL_TABLET | Freq: Two times a day (BID) | ORAL | Status: DC
  Administered 2014-04-30 – 2014-05-01 (×2): 500 mg via ORAL
  Filled 2014-04-30 (×5): qty 1

## 2014-04-30 NOTE — Progress Notes (Signed)
PROGRESS NOTE  Shelia Black JOA:[id number] DOB: [birth year] DOA: 04/29/2014 PCP is Shelia Black at [place]  HPI: 81 year old female with past medical history of hypothyroidism who presented to [facility] ED with worsening abdominal pain in lower abdominal area associated with ongoing nausea and non bloody vomiting, found to have diverticulitis on CT scan. She is from [place] visiting her daughter here in [place].   Subjective / 24 H Interval events - feeling better this morning, nausea improved, she is very hungry and wants to eat  Assessment/Plan: Principal Problem:   Diverticulitis Active Problems:   UTI (urinary tract infection)   Acute respiratory failure with hypoxia   Acute renal failure   Hyperkalemia   Nausea and vomiting  Diverticulitis / abdominal pain, nausea and vomiting  - Pt admitted with nausea and vomiting, abdominal pain.  - CT abdomen showed sigmoid diverticulitis without complicating factors  - Started cipro and flagyl, continue - Continue supportive care with IV fluids, analgesia and antiemetics as needed  - pain and nausea improved, will allow clear liquid diet, if tolerates good for breakfast and lunch will allow full liquids tonight  UTI (urinary tract infection)  - UA showed trace leukocytes  - She is already on cipor and flagyl for diverticulitis which would cover for UTI   Hypothyroidism  - Continue synthroid  - TSH low, decrease synthroid dose  - repeat TSH in 3-5 weeks as outpatient  Acute respiratory failure with hypoxia in the setting of ILD - known problem in [place] per clinic notes.   Acute renal failure  - improved with hydration  Hyperkalemia  - likely in the setting of renal failure, improved   Diet: Diet clear liquid Room service appropriate?: Yes; Fluid consistency:: Thin Fluids: NS DVT Prophylaxis: SCD  Code Status: Full Code Family Communication: none bedside  Disposition Plan: home when ready, likely 1-2  days   Consultants:  None   Procedures:  None    Antibiotics Ciprofloxacin 3/25 >> Metronidazole 3/25 >>   Studies  Ct Abdomen Pelvis Wo Contrast  04/29/2014   CLINICAL DATA:  Abdominal pain for 2 weeks  EXAM: CT ABDOMEN AND PELVIS WITHOUT CONTRAST  TECHNIQUE: Multidetector CT imaging of the abdomen and pelvis was performed following the standard protocol without IV contrast.  COMPARISON:  None.  FINDINGS: Lung bases demonstrate some fibrotic changes in the lower lobes bilaterally. No focal confluent infiltrate or sizable effusion is seen. Tiny 3 mm nodule is noted in the right middle lobe best seen on the first image of series 4.  The liver, gallbladder, spleen, adrenal glands and pancreas are within normal limits. A sliding-type hiatal hernia is noted. Kidneys are well visualized and demonstrate no renal calculi or obstructive change. The bladder is well distended. A few small hypodensities are noted within the kidneys but incompletely characterized. These may simply represent cysts.  The appendix has been surgically removed. Colonic diverticulosis is noted. There are changes consistent with mild diverticulitis in the mid sigmoid. No perforation or abscess is seen. No free pelvic fluid is noted. Degenerative changes of the lumbar spine  IMPRESSION: Diverticulitis in the mid sigmoid without complicating factors.  Chronic changes as described above.   Electronically Signed   By: Shelia Black M.D.   On: 04/29/2014 10:52   Dg Chest Port 1 View  04/29/2014   CLINICAL DATA:  Hypoxia.  EXAM: PORTABLE CHEST - 1 VIEW  COMPARISON:  CT scan abdomen dated 04/29/2014  FINDINGS: Heart size and pulmonary  vascularity are normal. There is diffuse accentuation of the interstitial markings consistent with chronic interstitial lung disease. The appearance on CT scan suggests interstitial fibrosis.  No infiltrates or effusions.  No acute osseous abnormality.  IMPRESSION: Chronic interstitial lung disease,  probably interstitial fibrosis.   Electronically Signed   By: Shelia Black M.D.   On: 04/29/2014 13:59    Objective  Filed Vitals:   04/29/14 2150 04/30/14 0515 04/30/14 0533 04/30/14 0915  BP: 114/51  117/41 106/45  Pulse: 66  76 59  Temp: 98.1 F (36.7 C)  98.1 F (36.7 C) 98.3 F (36.8 C)  TempSrc: Oral  Oral Oral  Resp: 16  16 18   Height:      Weight:  71.169 kg (156 lb 14.4 oz)    SpO2: 90%  96% 95%    Intake/Output Summary (Last 24 hours) at 04/30/14 1013 Last data filed at 04/30/14 0915  Gross per 24 hour  Intake   1225 ml  Output   1400 ml  Net   -175 ml   Filed Weights   04/29/14 1345 04/30/14 0515  Weight: 81.647 kg (180 lb) 71.169 kg (156 lb 14.4 oz)   Exam:  General:  NAD, HOH  HEENT: no scleral icterus  Cardiovascular: RRR without MRG, 2+ peripheral pulses, no edema  Respiratory: CTA biL, good air movement, no wheezing, no crackles, no rales  Abdomen: soft, non tender, BS +, no guarding  MSK/Extremities: no clubbing/cyanosis, no joint swelling  Skin: no rashes  Neuro: non focal  Data Reviewed: Basic Metabolic Panel:  Recent Labs Lab 04/29/14 0832 04/29/14 1222 04/30/14 0455  NA 134* 137 139  K 5.4* 4.6 4.0  CL 104 107 108  CO2 18* 20 23  GLUCOSE 156* 176* 104*  BUN 18 16 14   CREATININE 1.36* 1.12* 1.06  CALCIUM 8.9 8.5 8.7  MG  --  1.7  --   PHOS  --  2.8  --    Liver Function Tests:  Recent Labs Lab 04/29/14 0832 04/29/14 1222 04/30/14 0455  AST 35 24 19  ALT 17 19 16   ALKPHOS 57 54 49  BILITOT 0.9 0.3 0.3  PROT 7.4 6.7 6.2  ALBUMIN 3.8 3.5 3.2*    Recent Labs Lab 04/29/14 0832  LIPASE 17   CBC:  Recent Labs Lab 04/29/14 0832 04/30/14 0455  WBC 10.0 7.1  NEUTROABS 6.9  --   HGB 13.6 11.8*  HCT 40.0 36.4  MCV 95.0 97.3  PLT 288 265   Cardiac Enzymes:  Recent Labs Lab 04/29/14 0832  TROPONINI <0.03   CBG:  Recent Labs Lab 04/29/14 1432 04/29/14 1735 04/30/14 0742  GLUCAP 140* 95 109*    Scheduled Meds: . ciprofloxacin  400 mg Intravenous Q12H  . [START ON 05/01/2014] levothyroxine  88 mcg Oral QAC breakfast  . metronidazole  500 mg Intravenous Q8H  .  morphine injection  2 mg Intravenous Once   Continuous Infusions: . sodium chloride 75 mL/hr at 04/29/14 [place], MD [facility] Pager [phone]. If 7 PM - 7 AM, please contact night-coverage at [email or web address], password [email or web address] 04/30/2014, 10:13 AM  LOS: 1 day

## 2014-05-01 DIAGNOSIS — E039 Hypothyroidism, unspecified: Secondary | ICD-10-CM

## 2014-05-01 DIAGNOSIS — K5732 Diverticulitis of large intestine without perforation or abscess without bleeding: Principal | ICD-10-CM

## 2014-05-01 LAB — GLUCOSE, CAPILLARY: GLUCOSE-CAPILLARY: 106 mg/dL — AB (ref 70–99)

## 2014-05-01 NOTE — Discharge Summary (Signed)
Physician Discharge Summary  Maecy Podgurski DQQ:[id number] DOB: 1921-07-24 DOA: 04/29/2014  PCP: Waymon Budge at [facility] date: 04/29/2014 Discharge date: 05/01/2014  Time spent: > 30 minutes  Recommendations for Outpatient Follow-up:  1. Follow up with outpatient MD in 2-4 weeks. Patient from [place] but she is looking for a local MD while she is here for the next 2 months. Referrals made per case manager.  2. Repeat TSH in 4 weeks  Discharge Diagnoses:  Principal Problem:   Diverticulitis Active Problems:   UTI (urinary tract infection)   Acute respiratory failure with hypoxia   Acute renal failure   Hyperkalemia   Nausea and vomiting  Discharge Condition: stable  Filed Weights   04/29/14 1345 04/30/14 0515 05/01/14 0620  Weight: 81.647 kg (180 lb) 71.169 kg (156 lb 14.4 oz) 71.6 kg (157 lb 13.6 oz)   History of present illness:  81 year old female with past medical history of hypothyroidism who presented to [facility] ED with worsening abdominal pain in lower abdominal area associated with ongoing nausea and non bloody vomiting. Abdominal pain is 7/10 in intensity, non radiating, constant and not relieved with morphine given in ED. She has had this pain for past few days but getting worse in last 24 hours with severe nausea. No fevers or chills. No diarrhea or constipation. No chest pain, shortness of breath, palpitations. No lightheadedness or loss of consciousness. In ED, BP was 118/52, HR 75, T max 98 F and oxygen saturation was 87% on room air but has improved to 98% with [place] oxygen support. CT abdomen showed diverticulitis in the mid sigmoid region without complicating factor. UA showed trace leukocytes. She was started on cipro and flagyl.   Hospital Course:  Diverticulitis / abdominal pain, nausea and vomiting - Pt admitted with nausea and vomiting, abdominal pain in the setting of diverticulitis. CT abdomen showed sigmoid diverticulitis without  complicating factors. She was maintained NPO for 24 hours and IV antibiotics were started, improved, her diet was slowly advanced and was tolerating a soft diet well without nausea/vomiting and abdominal pain. She will be discharged home in stable condition with Ciprofloxacin and Metronidazole to complete a 10 day total course.  UTI (urinary tract infection) - UA showed trace leukocytes, final culture without growth. Hypothyroidism - Continue synthroid, TSH was found to be 0.03, I decreased her synthroid dose, she will need a repeat TSH as an outpatient in ~4 weeks. Acute respiratory failure with hypoxia in the setting of ILD, known problem in [place] per clinic notes.  Acute renal failure - improved with hydration  Procedures:  None    Consultations:  None   Discharge Exam: Filed Vitals:   04/30/14 0533 04/30/14 0915 04/30/14 2136 05/01/14 0620  BP: 117/41 106/45 110/56 108/62  Pulse: 76 59 68 70  Temp: 98.1 F (36.7 C) 98.3 F (36.8 C) 98.8 F (37.1 C) 98 F (36.7 C)  TempSrc: Oral Oral Oral Oral  Resp: 16 18 18 18   Height:      Weight:    71.6 kg (157 lb 13.6 oz)  SpO2: 96% 95% 96% 98%    General: NAD Cardiovascular: RRR Respiratory: no wheezing Abdomen: no tenderness, BS +  Discharge Instructions     Medication List    STOP taking these medications        sulfamethoxazole-trimethoprim 800-160 MG per tablet  Commonly known as:  BACTRIM DS,SEPTRA DS      TAKE these medications  ALIGN PO  Take 1 tablet by mouth daily.     ciprofloxacin 500 MG tablet  Commonly known as:  CIPRO  Take 1 tablet (500 mg total) by mouth 2 (two) times daily.     ibuprofen 200 MG tablet  Commonly known as:  ADVIL,MOTRIN  Take 800 mg by mouth every 4 (four) hours as needed for moderate pain.     levothyroxine 88 MCG tablet  Commonly known as:  SYNTHROID, LEVOTHROID  Take 1 tablet (88 mcg total) by mouth daily before breakfast.     metroNIDAZOLE 500 MG tablet  Commonly  known as:  FLAGYL  Take 1 tablet (500 mg total) by mouth every 8 (eight) hours.     zolpidem 5 MG tablet  Commonly known as:  AMBIEN  Take 2.5 mg by mouth at bedtime as needed for sleep.           Follow-up Information    Follow up with [facility].   Why:  call this number on Monday morning to secure a primary care physician   Contact information:   [phone]      The results of significant diagnostics from this hospitalization (including imaging, microbiology, ancillary and laboratory) are listed below for reference.    Significant Diagnostic Studies: Ct Abdomen Pelvis Wo Contrast  04/29/2014   CLINICAL DATA:  Abdominal pain for 2 weeks  EXAM: CT ABDOMEN AND PELVIS WITHOUT CONTRAST  TECHNIQUE: Multidetector CT imaging of the abdomen and pelvis was performed following the standard protocol without IV contrast.  COMPARISON:  None.  FINDINGS: Lung bases demonstrate some fibrotic changes in the lower lobes bilaterally. No focal confluent infiltrate or sizable effusion is seen. Tiny 3 mm nodule is noted in the right middle lobe best seen on the first image of series 4.  The liver, gallbladder, spleen, adrenal glands and pancreas are within normal limits. A sliding-type hiatal hernia is noted. Kidneys are well visualized and demonstrate no renal calculi or obstructive change. The bladder is well distended. A few small hypodensities are noted within the kidneys but incompletely characterized. These may simply represent cysts.  The appendix has been surgically removed. Colonic diverticulosis is noted. There are changes consistent with mild diverticulitis in the mid sigmoid. No perforation or abscess is seen. No free pelvic fluid is noted. Degenerative changes of the lumbar spine  IMPRESSION: Diverticulitis in the mid sigmoid without complicating factors.  Chronic changes as described above.   Electronically Signed   By: Inez Catalina M.D.   On: 04/29/2014 10:52   Dg Chest Port 1  View  04/29/2014   CLINICAL DATA:  Hypoxia.  EXAM: PORTABLE CHEST - 1 VIEW  COMPARISON:  CT scan abdomen dated 04/29/2014  FINDINGS: Heart size and pulmonary vascularity are normal. There is diffuse accentuation of the interstitial markings consistent with chronic interstitial lung disease. The appearance on CT scan suggests interstitial fibrosis.  No infiltrates or effusions.  No acute osseous abnormality.  IMPRESSION: Chronic interstitial lung disease, probably interstitial fibrosis.   Electronically Signed   By: Lorriane Shire M.D.   On: 04/29/2014 13:59    Microbiology: Recent Results (from the past 240 hour(s))  Urine culture     Status: None   Collection Time: 04/29/14  8:56 AM  Result Value Ref Range Status   Specimen Description URINE, CLEAN CATCH  Final   Special Requests NONE  Final   Colony Count NO GROWTH Performed at [facility]   Final   Culture NO GROWTH  Performed at [facility]   Final   Report Status 04/30/2014 FINAL  Final     Labs: Basic Metabolic Panel:  Recent Labs Lab 04/29/14 0832 04/29/14 1222 04/30/14 0455  NA 134* 137 139  K 5.4* 4.6 4.0  CL 104 107 108  CO2 18* 20 23  GLUCOSE 156* 176* 104*  BUN 18 16 14   CREATININE 1.36* 1.12* 1.06  CALCIUM 8.9 8.5 8.7  MG  --  1.7  --   PHOS  --  2.8  --    Liver Function Tests:  Recent Labs Lab 04/29/14 0832 04/29/14 1222 04/30/14 0455  AST 35 24 19  ALT 17 19 16   ALKPHOS 57 54 49  BILITOT 0.9 0.3 0.3  PROT 7.4 6.7 6.2  ALBUMIN 3.8 3.5 3.2*    Recent Labs Lab 04/29/14 0832  LIPASE 17   CBC:  Recent Labs Lab 04/29/14 0832 04/30/14 0455  WBC 10.0 7.1  NEUTROABS 6.9  --   HGB 13.6 11.8*  HCT 40.0 36.4  MCV 95.0 97.3  PLT 288 265   Cardiac Enzymes:  Recent Labs Lab 04/29/14 0832  TROPONINI <0.03   CBG:  Recent Labs Lab 04/29/14 1432 04/29/14 1735 04/30/14 0742 05/01/14 0806  GLUCAP 140* 95 109* 106*       Signed:  Astou Lada  [facility] 05/01/2014, 2:06 PM

## 2014-05-01 NOTE — Care Management Note (Signed)
    Page 1 of 1   05/01/2014     12:29:03 PM CARE MANAGEMENT NOTE 05/01/2014  Patient:  Shelia Black,Shelia Black   Account Number:  [id number]  Date Initiated:  05/01/2014  Documentation initiated by:  [facility]Wiley Magan  Subjective/Objective Assessment:   adm: diverticulitis     Action/Plan:   discharge planning   Anticipated DC Date:  05/01/2014   Anticipated DC Plan:  HOME/SELF CARE      DC Planning Services  CM consult      Choice offered to / List presented to:             Status of service:  Completed, signed off Medicare Important Message given?   (If response is "NO", the following Medicare IM given date fields will be blank) Date Medicare IM given:   Medicare IM given by:   Date Additional Medicare IM given:   Additional Medicare IM given by:    Discharge Disposition:  HOME/SELF CARE  Per UR Regulation:    If discussed at Long Length of Stay Meetings, dates discussed:    Comments:  05/01/14 12:26 Cm received  call from RN requesting PCP for pt.  CM placed [facility] [phone] number on pt's discharge instructions to call to secure a PCP.  No other CM needs were communicated.  Freddy JakschSarah Shloma Roggenkamp, BSN, CM [phone].

## 2014-05-01 NOTE — Discharge Instructions (Signed)
Follow with PCP in 5-7 days  Please get a complete blood count and chemistry panel checked by your Primary MD at your next visit, and again as instructed by your Primary MD. Please get your medications reviewed and adjusted by your Primary MD.  Please request your Primary MD to go over all Hospital Tests and Procedure/Radiological results at the follow up, please get all Hospital records sent to your Prim MD by signing hospital release before you go home.  If you had Pneumonia of Lung problems at the Hospital: Please get a 2 view Chest X ray done in 6-8 weeks after hospital discharge or sooner if instructed by your Primary MD.  If you have Congestive Heart Failure: Please call your Cardiologist or Primary MD anytime you have any of the following symptoms:  1) 3 pound weight gain in 24 hours or 5 pounds in 1 week  2) shortness of breath, with or without a dry hacking cough  3) swelling in the hands, feet or stomach  4) if you have to sleep on extra pillows at night in order to breathe  Follow cardiac low salt diet and 1.5 lit/day fluid restriction.  If you have diabetes Accuchecks 4 times/day, Once in AM empty stomach and then before each meal. Log in all results and show them to your primary doctor at your next visit. If any glucose reading is under 80 or above 300 call your primary MD immediately.  If you have Seizure/Convulsions/Epilepsy: Please do not drive, operate heavy machinery, participate in activities at heights or participate in high speed sports until you have seen by Primary MD or a Neurologist and advised to do so again.  If you had Gastrointestinal Bleeding: Please ask your Primary MD to check a complete blood count within one week of discharge or at your next visit. Your endoscopic/colonoscopic biopsies that are pending at the time of discharge, will also need to followed by your Primary MD.  Get Medicines reviewed and adjusted. Please take all your medications with you  for your next visit with your Primary MD  Please request your Primary MD to go over all hospital tests and procedure/radiological results at the follow up, please ask your Primary MD to get all Hospital records sent to his/her office.  If you experience worsening of your admission symptoms, develop shortness of breath, life threatening emergency, suicidal or homicidal thoughts you must seek medical attention immediately by calling 911 or calling your MD immediately  if symptoms less severe.  You must read complete instructions/literature along with all the possible adverse reactions/side effects for all the Medicines you take and that have been prescribed to you. Take any new Medicines after you have completely understood and accpet all the possible adverse reactions/side effects.   Do not drive or operate heavy machinery when taking Pain medications.   Do not take more than prescribed Pain, Sleep and Anxiety Medications  Special Instructions: If you have smoked or chewed Tobacco  in the last 2 yrs please stop smoking, stop any regular Alcohol  and or any Recreational drug use.  Wear Seat belts while driving.  Please note You were cared for by a hospitalist during your hospital stay. If you have any questions about your discharge medications or the care you received while you were in the hospital after you are discharged, you can call the unit and asked to speak with the hospitalist on call if the hospitalist that took care of you is not available. Once you are  discharged, your primary care physician will handle any further medical issues. Please note that NO REFILLS for any discharge medications will be authorized once you are discharged, as it is imperative that you return to your primary care physician (or establish a relationship with a primary care physician if you do not have one) for your aftercare needs so that they can reassess your need for medications and monitor your lab values.  You  can reach the hospitalist office at phone [phone] or fax [phone]   If you do not have a primary care physician, you can call [phone] for a physician referral.  Activity: As tolerated with Full fall precautions use walker/cane & assistance as needed  Diet: as tolerated  Disposition Home

## 2015-02-20 ENCOUNTER — Inpatient Hospital Stay (HOSPITAL_COMMUNITY)
Admission: EM | Admit: 2015-02-20 | Discharge: 2015-02-23 | DRG: 378 | Disposition: A | Discharge status: Home / Self Care | Payer: Medicare Other | Attending: Internal Medicine | Admitting: Internal Medicine

## 2015-02-20 ENCOUNTER — Encounter (HOSPITAL_COMMUNITY): Payer: Self-pay | Admitting: Emergency Medicine

## 2015-02-20 ENCOUNTER — Emergency Department (HOSPITAL_COMMUNITY): Payer: Medicare Other

## 2015-02-20 DIAGNOSIS — K921 Melena: Secondary | ICD-10-CM | POA: Diagnosis present

## 2015-02-20 DIAGNOSIS — K5733 Diverticulitis of large intestine without perforation or abscess with bleeding: Secondary | ICD-10-CM | POA: Diagnosis present

## 2015-02-20 DIAGNOSIS — N183 Chronic kidney disease, stage 3 unspecified: Secondary | ICD-10-CM | POA: Diagnosis present

## 2015-02-20 DIAGNOSIS — E11649 Type 2 diabetes mellitus with hypoglycemia without coma: Secondary | ICD-10-CM | POA: Diagnosis present

## 2015-02-20 DIAGNOSIS — K5732 Diverticulitis of large intestine without perforation or abscess without bleeding: Secondary | ICD-10-CM

## 2015-02-20 DIAGNOSIS — Z880 Allergy status to penicillin: Secondary | ICD-10-CM | POA: Diagnosis not present

## 2015-02-20 DIAGNOSIS — E039 Hypothyroidism, unspecified: Secondary | ICD-10-CM | POA: Diagnosis present

## 2015-02-20 DIAGNOSIS — Z8249 Family history of ischemic heart disease and other diseases of the circulatory system: Secondary | ICD-10-CM | POA: Diagnosis not present

## 2015-02-20 DIAGNOSIS — Z885 Allergy status to narcotic agent status: Secondary | ICD-10-CM | POA: Diagnosis not present

## 2015-02-20 DIAGNOSIS — N390 Urinary tract infection, site not specified: Secondary | ICD-10-CM | POA: Diagnosis not present

## 2015-02-20 DIAGNOSIS — E1165 Type 2 diabetes mellitus with hyperglycemia: Secondary | ICD-10-CM | POA: Diagnosis present

## 2015-02-20 DIAGNOSIS — E669 Obesity, unspecified: Secondary | ICD-10-CM | POA: Diagnosis present

## 2015-02-20 DIAGNOSIS — J849 Interstitial pulmonary disease, unspecified: Secondary | ICD-10-CM | POA: Diagnosis present

## 2015-02-20 DIAGNOSIS — R1030 Lower abdominal pain, unspecified: Secondary | ICD-10-CM | POA: Diagnosis present

## 2015-02-20 DIAGNOSIS — E860 Dehydration: Secondary | ICD-10-CM | POA: Diagnosis present

## 2015-02-20 DIAGNOSIS — Z79899 Other long term (current) drug therapy: Secondary | ICD-10-CM

## 2015-02-20 DIAGNOSIS — E1122 Type 2 diabetes mellitus with diabetic chronic kidney disease: Secondary | ICD-10-CM | POA: Diagnosis present

## 2015-02-20 DIAGNOSIS — E871 Hypo-osmolality and hyponatremia: Secondary | ICD-10-CM | POA: Diagnosis present

## 2015-02-20 DIAGNOSIS — E1169 Type 2 diabetes mellitus with other specified complication: Secondary | ICD-10-CM

## 2015-02-20 DIAGNOSIS — E119 Type 2 diabetes mellitus without complications: Secondary | ICD-10-CM | POA: Diagnosis not present

## 2015-02-20 DIAGNOSIS — K219 Gastro-esophageal reflux disease without esophagitis: Secondary | ICD-10-CM | POA: Diagnosis present

## 2015-02-20 DIAGNOSIS — M1990 Unspecified osteoarthritis, unspecified site: Secondary | ICD-10-CM | POA: Diagnosis present

## 2015-02-20 DIAGNOSIS — R0600 Dyspnea, unspecified: Secondary | ICD-10-CM

## 2015-02-20 DIAGNOSIS — R710 Precipitous drop in hematocrit: Secondary | ICD-10-CM | POA: Diagnosis not present

## 2015-02-20 DIAGNOSIS — R739 Hyperglycemia, unspecified: Secondary | ICD-10-CM | POA: Diagnosis not present

## 2015-02-20 LAB — LIPASE, BLOOD: LIPASE: 18 U/L (ref 11–51)

## 2015-02-20 LAB — COMPREHENSIVE METABOLIC PANEL
ALBUMIN: 3.4 g/dL — AB (ref 3.5–5.0)
ALT: 17 U/L (ref 14–54)
ANION GAP: 11 (ref 5–15)
AST: 17 U/L (ref 15–41)
Alkaline Phosphatase: 55 U/L (ref 38–126)
BILIRUBIN TOTAL: 0.9 mg/dL (ref 0.3–1.2)
BUN: 17 mg/dL (ref 6–20)
CHLORIDE: 100 mmol/L — AB (ref 101–111)
CO2: 23 mmol/L (ref 22–32)
Calcium: 9 mg/dL (ref 8.9–10.3)
Creatinine, Ser: 1.1 mg/dL — ABNORMAL HIGH (ref 0.44–1.00)
GFR calc Af Amer: 49 mL/min — ABNORMAL LOW (ref >60)
GFR, EST NON AFRICAN AMERICAN: 43 mL/min — AB (ref >60)
Glucose, Bld: 144 mg/dL — ABNORMAL HIGH (ref 65–99)
POTASSIUM: 4.5 mmol/L (ref 3.5–5.1)
Sodium: 134 mmol/L — ABNORMAL LOW (ref 135–145)
TOTAL PROTEIN: 7.4 g/dL (ref 6.5–8.1)

## 2015-02-20 LAB — CBC WITH DIFFERENTIAL/PLATELET
BASOS PCT: 0 %
Basophils Absolute: 0 10*3/uL (ref 0.0–0.1)
Eosinophils Absolute: 0.2 10*3/uL (ref 0.0–0.7)
Eosinophils Relative: 2 %
HEMATOCRIT: 38.9 % (ref 36.0–46.0)
HEMOGLOBIN: 12.7 g/dL (ref 12.0–15.0)
LYMPHS ABS: 2.6 10*3/uL (ref 0.7–4.0)
LYMPHS PCT: 23 %
MCH: 30.9 pg (ref 26.0–34.0)
MCHC: 32.6 g/dL (ref 30.0–36.0)
MCV: 94.6 fL (ref 78.0–100.0)
MONOS PCT: 12 %
Monocytes Absolute: 1.4 10*3/uL — ABNORMAL HIGH (ref 0.1–1.0)
NEUTROS ABS: 7.1 10*3/uL (ref 1.7–7.7)
NEUTROS PCT: 63 %
Platelets: 226 10*3/uL (ref 150–400)
RBC: 4.11 MIL/uL (ref 3.87–5.11)
RDW: 13.3 % (ref 11.5–15.5)
WBC: 11.3 10*3/uL — ABNORMAL HIGH (ref 4.0–10.5)

## 2015-02-20 LAB — CBC
HEMATOCRIT: 40.6 % (ref 36.0–46.0)
HEMOGLOBIN: 13.3 g/dL (ref 12.0–15.0)
MCH: 31.4 pg (ref 26.0–34.0)
MCHC: 32.8 g/dL (ref 30.0–36.0)
MCV: 95.8 fL (ref 78.0–100.0)
Platelets: 231 10*3/uL (ref 150–400)
RBC: 4.24 MIL/uL (ref 3.87–5.11)
RDW: 13.4 % (ref 11.5–15.5)
WBC: 12.7 10*3/uL — AB (ref 4.0–10.5)

## 2015-02-20 LAB — URINALYSIS, ROUTINE W REFLEX MICROSCOPIC
Glucose, UA: NEGATIVE mg/dL
Hgb urine dipstick: NEGATIVE
KETONES UR: NEGATIVE mg/dL
NITRITE: POSITIVE — AB
PH: 5.5 (ref 5.0–8.0)
Protein, ur: NEGATIVE mg/dL
SPECIFIC GRAVITY, URINE: 1.024 (ref 1.005–1.030)

## 2015-02-20 LAB — I-STAT CG4 LACTIC ACID, ED
Lactic Acid, Venous: 1.05 mmol/L (ref 0.5–2.0)
Lactic Acid, Venous: 1.18 mmol/L (ref 0.5–2.0)

## 2015-02-20 LAB — URINE MICROSCOPIC-ADD ON

## 2015-02-20 LAB — POC OCCULT BLOOD, ED: FECAL OCCULT BLD: NEGATIVE

## 2015-02-20 MED ORDER — FENTANYL CITRATE (PF) 100 MCG/2ML IJ SOLN
12.5000 ug | Freq: Once | INTRAMUSCULAR | Status: AC
  Administered 2015-02-20: 12.5 ug via INTRAVENOUS
  Filled 2015-02-20: qty 2

## 2015-02-20 MED ORDER — HYDROCODONE-ACETAMINOPHEN 5-325 MG PO TABS: 1.0000 | ORAL_TABLET | ORAL | Status: DC | PRN

## 2015-02-20 MED ORDER — METRONIDAZOLE IN NACL 5-0.79 MG/ML-% IV SOLN: 500.0000 mg | Freq: Once | INTRAVENOUS | Status: DC

## 2015-02-20 MED ORDER — ALBUTEROL SULFATE HFA 108 (90 BASE) MCG/ACT IN AERS: 1.0000 | INHALATION_SPRAY | Freq: Four times a day (QID) | RESPIRATORY_TRACT | Status: DC | PRN

## 2015-02-20 MED ORDER — ACETAMINOPHEN 325 MG PO TABS
650.0000 mg | ORAL_TABLET | Freq: Four times a day (QID) | ORAL | Status: DC | PRN
Start: 2015-02-20 — End: 2015-02-23

## 2015-02-20 MED ORDER — SODIUM CHLORIDE 0.9 % IV SOLN
INTRAVENOUS | Status: AC
  Administered 2015-02-21: 01:00:00 via INTRAVENOUS

## 2015-02-20 MED ORDER — HYDROCOD POLST-CPM POLST ER 10-8 MG/5ML PO SUER: 2.5000 mL | Freq: Two times a day (BID) | ORAL | Status: DC | PRN

## 2015-02-20 MED ORDER — IOHEXOL 300 MG/ML  SOLN: 25.0000 mL | Freq: Once | INTRAMUSCULAR | Status: DC | PRN

## 2015-02-20 MED ORDER — MORPHINE SULFATE (PF) 2 MG/ML IV SOLN: 1.0000 mg | INTRAVENOUS | Status: DC | PRN

## 2015-02-20 MED ORDER — ONDANSETRON HCL 4 MG/2ML IJ SOLN
4.0000 mg | Freq: Four times a day (QID) | INTRAMUSCULAR | Status: DC | PRN
Start: 2015-02-20 — End: 2015-02-23

## 2015-02-20 MED ORDER — SODIUM CHLORIDE 0.9 % IV BOLUS (SEPSIS)
500.0000 mL | Freq: Once | INTRAVENOUS | Status: AC
  Administered 2015-02-20: 500 mL via INTRAVENOUS

## 2015-02-20 MED ORDER — CIPROFLOXACIN IN D5W 400 MG/200ML IV SOLN
400.0000 mg | Freq: Once | INTRAVENOUS | Status: AC
  Administered 2015-02-20: 400 mg via INTRAVENOUS
  Filled 2015-02-20: qty 200

## 2015-02-20 MED ORDER — CIPROFLOXACIN IN D5W 400 MG/200ML IV SOLN
400.0000 mg | Freq: Two times a day (BID) | INTRAVENOUS | Status: DC
  Administered 2015-02-21 – 2015-02-22 (×3): 400 mg via INTRAVENOUS
  Filled 2015-02-20 (×4): qty 200

## 2015-02-20 MED ORDER — RISAQUAD PO CAPS
1.0000 | ORAL_CAPSULE | Freq: Every day | ORAL | Status: DC
  Administered 2015-02-21 – 2015-02-23 (×3): 1 via ORAL
  Filled 2015-02-20 (×4): qty 1

## 2015-02-20 MED ORDER — ENOXAPARIN SODIUM 40 MG/0.4ML ~~LOC~~ SOLN: 40.0000 mg | SUBCUTANEOUS | Status: DC

## 2015-02-20 MED ORDER — ACETAMINOPHEN 650 MG RE SUPP: 650.0000 mg | Freq: Four times a day (QID) | RECTAL | Status: DC | PRN

## 2015-02-20 MED ORDER — IOHEXOL 300 MG/ML  SOLN
80.0000 mL | Freq: Once | INTRAMUSCULAR | Status: AC | PRN
  Administered 2015-02-20: 80 mL via INTRAVENOUS

## 2015-02-20 MED ORDER — ONDANSETRON HCL 4 MG PO TABS: 4.0000 mg | ORAL_TABLET | Freq: Four times a day (QID) | ORAL | Status: DC | PRN

## 2015-02-20 MED ORDER — ZOLPIDEM TARTRATE 5 MG PO TABS
2.5000 mg | ORAL_TABLET | Freq: Every evening | ORAL | Status: DC | PRN
Start: 2015-02-20 — End: 2015-02-23

## 2015-02-20 MED ORDER — INSULIN ASPART 100 UNIT/ML ~~LOC~~ SOLN
0.0000 [IU] | Freq: Three times a day (TID) | SUBCUTANEOUS | Status: DC
  Administered 2015-02-21: 2 [IU] via SUBCUTANEOUS
  Administered 2015-02-22: 3 [IU] via SUBCUTANEOUS
  Administered 2015-02-23: 2 [IU] via SUBCUTANEOUS

## 2015-02-20 MED ORDER — LEVOTHYROXINE SODIUM 25 MCG PO TABS
125.0000 ug | ORAL_TABLET | Freq: Every day | ORAL | Status: DC
  Administered 2015-02-21 – 2015-02-23 (×3): 125 ug via ORAL
  Filled 2015-02-20 (×4): qty 1

## 2015-02-20 MED ORDER — PANTOPRAZOLE SODIUM 40 MG PO TBEC
40.0000 mg | DELAYED_RELEASE_TABLET | Freq: Every day | ORAL | Status: DC
  Administered 2015-02-21 – 2015-02-23 (×3): 40 mg via ORAL
  Filled 2015-02-20 (×3): qty 1

## 2015-02-20 MED ORDER — METRONIDAZOLE IN NACL 5-0.79 MG/ML-% IV SOLN
500.0000 mg | Freq: Three times a day (TID) | INTRAVENOUS | Status: DC
  Administered 2015-02-21 – 2015-02-22 (×5): 500 mg via INTRAVENOUS
  Filled 2015-02-20 (×5): qty 100

## 2015-02-20 NOTE — ED Notes (Signed)
[facility]

## 2015-02-20 NOTE — Progress Notes (Signed)
Pt from [place] States she comes to stay with dtr, Gavin Poundeborah during the winters Has a son remaining in [place] PCP in [place] is Dierdre HarnessJonathan Hayden Pt has been seen by [facility], [employer] [facility] Delena ServeWharton (dtr, Gavin Poundeborah Dr she is trying to establish care with while in [place])  Pt is only visiting  EPIC updated

## 2015-02-20 NOTE — ED Notes (Signed)
Pt refusing an IV at this time. Pt requesting medications be changed to PO

## 2015-02-20 NOTE — H&P (Signed)
[facility] History and Physical  Shelia Black EUM:[id number] DOB: [birth year] DOA: 02/20/2015  Referring physician: ED physician PCP: [name], PA-C  Specialists:   Chief Complaint:  Abdominal pain, bloody BM    HPI: Shelia Black is a 82 y.o. female with PMH of type 2 diabetes and hypothyroidism who presents to the ED with 3 days of abdominal pain with 1 bloody stool on the day of her presentation. She had been in her usual state of health up until 02/18/2015 when she developed lower abdominal pain. She described the pain as sharp, constant, severe, and localized to the lower abdomen. Pain is worse with certain movements and no alleviating factors been identified. Patient denies any associated fever, chills, vomiting, or diarrhea. She denies chest pain, palpitations, or headache. She notes that earlier today she had 1 bowel movement that was frankly bloody. She saw her outpatient physician for this earlier today and was directed to the ED.  In ED, patient was found to be afebrile, saturating well on room air, and with vital signs stable. Initial blood work revealed a leukocytosis to 12,700 and mild hyponatremia and mild renal insufficiency. A CT of the abdomen and pelvis was obtained and demonstrated inflammation adjacent to a large sigmoid diverticulum suspicious for diverticulitis. There is no free air or associated abscess on the imaging. Patient was given a 500 cc NS bolus in the emergency department and treated with empiric Flagyl and ciprofloxacin. She'll be admitted to the hospital for ongoing evaluation and management of uncomplicated diverticulitis.   Where does patient live?   At home     Can patient participate in ADLs?  Yes        Review of Systems:   General: no fevers, chills, sweats, weight change, poor appetite, or fatigue HEENT: no blurry vision, hearing changes or sore throat Pulm: no dyspnea, cough, or wheeze CV: no chest pain or palpitations Abd:   Abdominal pain and BRBPR x1, no vomiting, diarrhea, or constipation GU: no dysuria, hematuria, increased urinary frequency, or urgency  Ext: no leg edema Neuro: no focal weakness, numbness, or tingling, no vision change. Chronic hearing loss   Skin: no rash, no wounds MSK: No muscle spasm, no deformity, no red, hot, or swollen joint Heme: No easy bruising or bleeding Travel history: No recent long distant travel. Recently in [place]    Allergy:  Allergies  Allergen Reactions  . Codeine     Nausea and vomiting  . Percocet [Oxycodone-Acetaminophen] Nausea And Vomiting  . Penicillins Hives and Rash    Has patient had a PCN reaction causing immediate rash, facial/tongue/throat swelling, SOB or lightheadedness with hypotension: no Has patient had a PCN reaction causing severe rash involving mucus membranes or skin necrosis: unknown Has patient had a PCN reaction that required hospitalization : unknown Has patient had a PCN reaction occurring within the last 10 years: no If all of the above answers are "NO", then may proceed with Cephalosporin use.     Past Medical History  Diagnosis Date  . Thyroid disease   . Diabetes mellitus without complication ([place])   . Cataract   . Chronic cough   . Diverticular disease   . Hypothyroidism   . GERD (gastroesophageal reflux disease)   . Arthritis     Past Surgical History  Procedure Laterality Date  . Appendectomy    . Replacement total knee bilateral    . Joint replacement      Social History:  reports that she has never smoked.  She has never used smokeless tobacco. She reports that she does not drink alcohol or use illicit drugs.  Family History:  Family History  Problem Relation Age of Onset  . Hypertension Child      Prior to Admission medications   Medication Sig Start Date End Date Taking? Authorizing Provider  albuterol (PROVENTIL HFA;VENTOLIN HFA) 108 (90 Base) MCG/ACT inhaler Inhale 1 puff into the lungs every 6 (six) hours as  needed for wheezing or shortness of breath.   Yes Historical Provider, MD  diphenoxylate-atropine (LOMOTIL) 2.5-0.025 MG tablet Take 1 tablet by mouth 4 (four) times daily as needed for diarrhea or loose stools. diarrhea 11/11/12  Yes Historical Provider, MD  Hydrocodone-Chlorpheniramine 5-4 MG/5ML SOLN Take 5 mLs by mouth 2 (two) times daily as needed (cough).    Yes Historical Provider, MD  ibuprofen (ADVIL,MOTRIN) 200 MG tablet Take 800 mg by mouth every 4 (four) hours as needed for moderate pain.   Yes Historical Provider, MD  levothyroxine (SYNTHROID, LEVOTHROID) 125 MCG tablet Take 125 mcg by mouth daily before breakfast.   Yes Historical Provider, MD  omeprazole (PRILOSEC) 20 MG capsule Take 20 mg by mouth daily.   Yes Historical Provider, MD  Probiotic Product (ALIGN PO) Take 1 tablet by mouth daily.   Yes Historical Provider, MD  zolpidem (AMBIEN) 5 MG tablet Take 2.5 mg by mouth at bedtime as needed for sleep.   Yes Historical Provider, MD    Physical Exam: Filed Vitals:   02/20/15 1235 02/20/15 1437 02/20/15 1813 02/20/15 2046  BP: 101/50 114/95 98/76 111/76  Pulse: 68 61 79 69  Temp: 97.9 F (36.6 C)  97.5 F (36.4 C)   TempSrc: Oral     Resp:   14 21  SpO2: 97% 95% 98% 96%   General: Not in acute distress, but obvious discomfort HEENT:       Eyes: PERRL, EOMI, no scleral icterus or conjunctival pallor.       ENT: No discharge from the ears or nose, no pharyngeal ulcers, petechiae or exudate, no tonsillar enlargement.        Neck: No JVD, no bruit, no appreciable mass Heme: No cervical adenopathy, no pallor Cardiac: S1/S2, RRR, No murmurs, No gallops or rubs. Pulm: Good air movement bilaterally. No rales, wheezing, rhonchi or rubs. Abd: Soft, nondistended, tender throughout, mainly lower quadrants, no rebound pain or gaurding, no mass or organomegaly, BS present. Ext: No LE edema bilaterally. 2+DP/PT pulse bilaterally. Musculoskeletal: No gross deformity, no red, hot,  swollen joints, no limitation in ROM  Skin: No rashes or wounds on exposed surfaces  Neuro: Alert, oriented X3, cranial nerves II-XII grossly intact. No focal findings Psych: Patient is not overtly psychotic, appropriate mood and affect.  Labs on Admission:  Basic Metabolic Panel:  Recent Labs Lab 02/20/15 1328  NA 134*  K 4.5  CL 100*  CO2 23  GLUCOSE 144*  BUN 17  CREATININE 1.10*  CALCIUM 9.0   Liver Function Tests:  Recent Labs Lab 02/20/15 1328  AST 17  ALT 17  ALKPHOS 55  BILITOT 0.9  PROT 7.4  ALBUMIN 3.4*    Recent Labs Lab 02/20/15 1328  LIPASE 18   No results for input(s): AMMONIA in the last 168 hours. CBC:  Recent Labs Lab 02/20/15 1328 02/20/15 1833  WBC 12.7* 11.3*  NEUTROABS  --  7.1  HGB 13.3 12.7  HCT 40.6 38.9  MCV 95.8 94.6  PLT 231 226   Cardiac Enzymes: No results  for input(s): CKTOTAL, CKMB, CKMBINDEX, TROPONINI in the last 168 hours.  BNP (last 3 results) No results for input(s): BNP in the last 8760 hours.  ProBNP (last 3 results) No results for input(s): PROBNP in the last 8760 hours.  CBG: No results for input(s): GLUCAP in the last 168 hours.  Radiological Exams on Admission: Ct Abdomen Pelvis W Contrast  02/20/2015  CLINICAL DATA:  Worsening lower abdominal pain and leukocytosis. Clinical concern for diverticulitis. EXAM: CT ABDOMEN AND PELVIS WITH CONTRAST TECHNIQUE: Multidetector CT imaging of the abdomen and pelvis was performed using the standard protocol following bolus administration of intravenous contrast. CONTRAST:  65m OMNIPAQUE IOHEXOL 300 MG/ML  SOLN COMPARISON:  04/29/2014 FINDINGS: Lower chest: No acute findings. Chronic interstitial lung changes with paraseptal bullous formation are seen. The heart size is mildly enlarged. There is a small hiatal hernia. Hepatobiliary: No masses or other significant abnormality. The gallbladder is normal. Pancreas: No mass, inflammatory changes, or other significant  abnormality. The pancreas is atrophic. Spleen: Within normal limits in size and appearance. Adrenals/Urinary Tract: No masses identified. No evidence of hydronephrosis. 21 mm partially exophytic left renal cyst is noted. Stomach/Bowel: No evidence of obstruction, or abnormal fluid collections. There are multiple sigmoid diverticula with evidence of pericolonic inflammatory changes in the distal sigmoid colon centered around enlarged diverticulum, suggestive of associated diverticulitis, image 73, series 2. There is no evidence of free gas to suggest perforation or enhancing fluid collections to abscess formation. Vascular/Lymphatic: No pathologically enlarged lymph nodes. No evidence of abdominal aortic aneurysm. Atherosclerotic calcifications of the aorta and its main branches are seen. Reproductive: No mass or other significant abnormality. Other: None. Musculoskeletal: No suspicious bone lesions identified. Multilevel osteoarthritic changes worse at L5-S1 noted. IMPRESSION: Extensive sigmoid diverticulosis, with evidence of diverticulitis in the distal sigmoid colon, without perforation or abscess formation. Small hiatal hernia. Chronic interstitial lung changes in the lung bases. Benign-appearing left renal cyst. Electronically Signed   By: DFidela SalisburyM.D.   On: 02/20/2015 20:55    EKG:   Not done in ED, will obtain as appropriate   Assessment/Plan  1. Acute uncomplicated diverticulitis - Sigmoid diverticulitis without free air or associated abscess; no systemic s/s  - Treating empirically with IV cipro 400 mg q12h, and IV Flagyl 500 mg q8h  - Will allow clear liquids as tolerated for now  - Gentle IVF hydration  - Reported a grossly bloody stool PTA; Hgb okay now, will re-check prior to pharmacologic VTE ppx, SCD only for now  2. UTI  - UA with leukocytes and pos nitrite, culture pending  - Cipro empirically as above; f/u culture result and modify accordingly prn   3. Type 2 DM  -  Serum glucose elevated on presentation  - On clear liquid diet currently  - CBGs QID  - Sensitive SSI correctional, no basal for now, adjust prn  - A1c pending   4. Hyponatremia  - Mild, d/t dehydration, anticipate resolution with IVF  - Repeat chem panel tomorrow    5. CKD stage 3 - Appears stable at this time  - Continue gentle IVF hydration, particularly after contrast agent given in ED  - Will avoid nephrotoxins  - Repeat chem panel tomorrow   DVT ppx:  SCD    Code Status: Full code Family Communication:  Yes, patient's daughter at bed side Disposition Plan: Admit to inpatient   Date of Service 02/20/2015    TVianne Bulls MD [facility] Pager [phone] If 7PM-7AM,  please contact night-coverage [email or web address] Password [facility] 02/20/2015, 11:21 PM

## 2015-02-20 NOTE — ED Provider Notes (Signed)
CSN: [id number]     Arrival date & time 02/20/15  1213 History   First MD Initiated Contact with Patient 02/20/15 1738     Chief Complaint  Patient presents with  . Abdominal Pain     (Consider location/radiation/quality/duration/timing/severity/associated sxs/prior Treatment) HPI   Shelia Black is a 82 y.o. female with PMH significant for DM, chronic cough, COPD, diverticulosis, GERD, arthritis who presents from [facility] for concern of diverticulitis with increased WBC.  She complains of 3 day history of gradual onset, constant, moderate, sharp lower abdominal pain.  No aggravating factors.  No relieving factors.  She has had 1 tablet of metronidazole and 2 tablets of SMT-TMP.  Associated symptoms include hematochezia (noticed today) and diarrhea.  Denies fever, CP, SOB, melena, painful BMs, constipation, urinary symptoms, or N/V.  Last BM yesterday.  Previous abdominal surgeries include appendectomy.  She reports recent hospitalization for COPD exacerbation and being discharged 02/05/15.   Past Medical History  Diagnosis Date  . Thyroid disease   . Diabetes mellitus without complication ([facility])   . Cataract   . Chronic cough   . Diverticular disease   . Hypothyroidism   . GERD (gastroesophageal reflux disease)   . Arthritis    Past Surgical History  Procedure Laterality Date  . Appendectomy    . Replacement total knee bilateral    . Joint replacement     No family history on file. Social History  Substance Use Topics  . Smoking status: Never Smoker   . Smokeless tobacco: Never Used  . Alcohol Use: No   OB History    No data available     Review of Systems All other systems negative unless otherwise stated in HPI    Allergies  Codeine; Percocet; and Penicillins  Home Medications   Prior to Admission medications   Medication Sig Start Date End Date Taking? Authorizing Provider  albuterol (PROVENTIL HFA;VENTOLIN HFA) 108 (90 Base) MCG/ACT inhaler  Inhale 1 puff into the lungs every 6 (six) hours as needed for wheezing or shortness of breath.   Yes Historical Provider, MD  diphenoxylate-atropine (LOMOTIL) 2.5-0.025 MG tablet Take 1 tablet by mouth 4 (four) times daily as needed for diarrhea or loose stools. diarrhea 11/11/12  Yes Historical Provider, MD  Hydrocodone-Chlorpheniramine 5-4 MG/5ML SOLN Take 5 mLs by mouth 2 (two) times daily as needed (cough).    Yes Historical Provider, MD  ibuprofen (ADVIL,MOTRIN) 200 MG tablet Take 800 mg by mouth every 4 (four) hours as needed for moderate pain.   Yes Historical Provider, MD  levothyroxine (SYNTHROID, LEVOTHROID) 125 MCG tablet Take 125 mcg by mouth daily before breakfast.   Yes Historical Provider, MD  omeprazole (PRILOSEC) 20 MG capsule Take 20 mg by mouth daily.   Yes Historical Provider, MD  Probiotic Product (ALIGN PO) Take 1 tablet by mouth daily.   Yes Historical Provider, MD  zolpidem (AMBIEN) 5 MG tablet Take 2.5 mg by mouth at bedtime as needed for sleep.   Yes Historical Provider, MD  levothyroxine (SYNTHROID, LEVOTHROID) 88 MCG tablet Take 1 tablet (88 mcg total) by mouth daily before breakfast. Patient not taking: Reported on 02/20/2015 04/30/14   Leatha Gilding, MD  predniSONE (DELTASONE) 10 MG tablet Take 10-30 mg by mouth as directed. Reported on 02/20/2015    Historical Provider, MD   BP 111/76 mmHg  Pulse 69  Temp(Src) 97.5 F (36.4 C) (Oral)  Resp 21  SpO2 96%  LMP  (LMP Unknown) Physical Exam  Constitutional:  She is oriented to person, place, and time. She appears well-developed and well-nourished.  HENT:  Head: Normocephalic and atraumatic.  Mouth/Throat: Oropharynx is clear and moist.  Eyes: Conjunctivae are normal. Pupils are equal, round, and reactive to light.  Neck: Normal range of motion. Neck supple.  Cardiovascular: Normal rate, regular rhythm and normal heart sounds.   No murmur heard. Pulmonary/Chest: Effort normal and breath sounds normal. No accessory  muscle usage or stridor. No respiratory distress. She has no wheezes. She has no rhonchi. She has no rales.  Abdominal: Soft. Bowel sounds are normal. She exhibits no distension. There is generalized tenderness (Generalized tenderness with more tenderness in RLQ/suprapubic/LLQ). There is no rigidity, no rebound and no guarding (voluntary).  Musculoskeletal: Normal range of motion.  Lymphadenopathy:    She has no cervical adenopathy.  Neurological: She is alert and oriented to person, place, and time.  Speech clear without dysarthria.  Skin: Skin is warm and dry.  Psychiatric: She has a normal mood and affect. Her behavior is normal.    ED Course  Procedures (including critical care time) Labs Review Labs Reviewed  COMPREHENSIVE METABOLIC PANEL - Abnormal; Notable for the following:    Sodium 134 (*)    Chloride 100 (*)    Glucose, Bld 144 (*)    Creatinine, Ser 1.10 (*)    Albumin 3.4 (*)    GFR calc non Af Amer 43 (*)    GFR calc Af Amer 49 (*)    All other components within normal limits  CBC - Abnormal; Notable for the following:    WBC 12.7 (*)    All other components within normal limits  URINALYSIS, ROUTINE W REFLEX MICROSCOPIC (NOT AT [facility]) - Abnormal; Notable for the following:    Color, Urine AMBER (*)    APPearance CLOUDY (*)    Bilirubin Urine SMALL (*)    Nitrite POSITIVE (*)    Leukocytes, UA SMALL (*)    All other components within normal limits  CBC WITH DIFFERENTIAL/PLATELET - Abnormal; Notable for the following:    WBC 11.3 (*)    Monocytes Absolute 1.4 (*)    All other components within normal limits  URINE MICROSCOPIC-ADD ON - Abnormal; Notable for the following:    Squamous Epithelial / LPF 6-30 (*)    Bacteria, UA RARE (*)    All other components within normal limits  URINE CULTURE  LIPASE, BLOOD  OCCULT BLOOD X 1 CARD TO LAB, STOOL  I-STAT CG4 LACTIC ACID, ED  POC OCCULT BLOOD, ED  I-STAT CG4 LACTIC ACID, ED    Imaging Review Ct Abdomen Pelvis  W Contrast  02/20/2015  CLINICAL DATA:  Worsening lower abdominal pain and leukocytosis. Clinical concern for diverticulitis. EXAM: CT ABDOMEN AND PELVIS WITH CONTRAST TECHNIQUE: Multidetector CT imaging of the abdomen and pelvis was performed using the standard protocol following bolus administration of intravenous contrast. CONTRAST:  80mL OMNIPAQUE IOHEXOL 300 MG/ML  SOLN COMPARISON:  04/29/2014 FINDINGS: Lower chest: No acute findings. Chronic interstitial lung changes with paraseptal bullous formation are seen. The heart size is mildly enlarged. There is a small hiatal hernia. Hepatobiliary: No masses or other significant abnormality. The gallbladder is normal. Pancreas: No mass, inflammatory changes, or other significant abnormality. The pancreas is atrophic. Spleen: Within normal limits in size and appearance. Adrenals/Urinary Tract: No masses identified. No evidence of hydronephrosis. 21 mm partially exophytic left renal cyst is noted. Stomach/Bowel: No evidence of obstruction, or abnormal fluid collections. There are multiple sigmoid diverticula  with evidence of pericolonic inflammatory changes in the distal sigmoid colon centered around enlarged diverticulum, suggestive of associated diverticulitis, image 73, series 2. There is no evidence of free gas to suggest perforation or enhancing fluid collections to abscess formation. Vascular/Lymphatic: No pathologically enlarged lymph nodes. No evidence of abdominal aortic aneurysm. Atherosclerotic calcifications of the aorta and its main branches are seen. Reproductive: No mass or other significant abnormality. Other: None. Musculoskeletal: No suspicious bone lesions identified. Multilevel osteoarthritic changes worse at L5-S1 noted. IMPRESSION: Extensive sigmoid diverticulosis, with evidence of diverticulitis in the distal sigmoid colon, without perforation or abscess formation. Small hiatal hernia. Chronic interstitial lung changes in the lung bases.  Benign-appearing left renal cyst. Electronically Signed   By: Ted Mcalpine M.D.   On: 02/20/2015 20:55   I have personally reviewed and evaluated these images and lab results as part of my medical decision-making.   EKG Interpretation   Date/Time:  Monday February 20 2015 18:20:01 EST Ventricular Rate:  66 PR Interval:  122 QRS Duration: 95 QT Interval:  405 QTC Calculation: 424 R Axis:   9 Text Interpretation:  Sinus rhythm Multiple ventricular premature  complexes besides PVCs, no significant change since Mar 2016 Confirmed by  GOLDSTON  MD, SCOTT [phone]) on 02/20/2015 7:40:03 PM      MDM   Final diagnoses:  Diverticulitis of large intestine without perforation or abscess without bleeding  UTI (lower urinary tract infection)    Patient presents with 3 day history of lower abdominal pain.  Hx of diverticulitis.  Assoc bloody stools.  VSS, NAD.  On exam, heart RRR, lungs CTAB, abdomen soft with generalized tenderness.  Will obtain labs, give fluids, and manage pain.    CBC remarkable for HGB 12.7.  Repeat WBC 11.3. CMP remarkable for Cr 1.1, baseline Lactic 1.05.  Repeat lactic acid 1.18. Fecal occult negative.  CT remarkable for extensive diverticulosis, with evidence of diverticulitis in the distal sigmoid colon, without perforation or abscess formation.  Small hiatal hernia.  Chronic interstitial lung changes in lung bases.  Benign appearing left renal cyst. Will give IV metronidazole and cipro.  Admit to medicine for IV Cipro and Flagyl for diverticulitis and UTI.  Cheri Fowler, PA-C 02/20/15 9604  Pricilla Loveless, MD 02/22/15 [phone]

## 2015-02-20 NOTE — ED Notes (Signed)
Pt reports worsening lower abdominal pain onset Saturday; [facility] for concern of diverticulitis with increased WBC.

## 2015-02-21 ENCOUNTER — Inpatient Hospital Stay (HOSPITAL_COMMUNITY): Payer: Medicare Other

## 2015-02-21 DIAGNOSIS — N390 Urinary tract infection, site not specified: Secondary | ICD-10-CM | POA: Insufficient documentation

## 2015-02-21 LAB — GLUCOSE, CAPILLARY
GLUCOSE-CAPILLARY: 79 mg/dL (ref 65–99)
GLUCOSE-CAPILLARY: 194 mg/dL — AB (ref 65–99)
Glucose-Capillary: 164 mg/dL — ABNORMAL HIGH (ref 65–99)

## 2015-02-21 LAB — CBC WITH DIFFERENTIAL/PLATELET
BASOS ABS: 0 10*3/uL (ref 0.0–0.1)
Basophils Relative: 0 %
EOS ABS: 0.3 10*3/uL (ref 0.0–0.7)
EOS PCT: 3 %
HCT: 36.3 % (ref 36.0–46.0)
Hemoglobin: 11.7 g/dL — ABNORMAL LOW (ref 12.0–15.0)
Lymphocytes Relative: 24 %
Lymphs Abs: 2.4 10*3/uL (ref 0.7–4.0)
MCH: 31.1 pg (ref 26.0–34.0)
MCHC: 32.2 g/dL (ref 30.0–36.0)
MCV: 96.5 fL (ref 78.0–100.0)
Monocytes Absolute: 1.2 10*3/uL — ABNORMAL HIGH (ref 0.1–1.0)
Monocytes Relative: 12 %
Neutro Abs: 6 10*3/uL (ref 1.7–7.7)
Neutrophils Relative %: 61 %
PLATELETS: 222 10*3/uL (ref 150–400)
RBC: 3.76 MIL/uL — AB (ref 3.87–5.11)
RDW: 13.3 % (ref 11.5–15.5)
WBC: 9.9 10*3/uL (ref 4.0–10.5)

## 2015-02-21 LAB — COMPREHENSIVE METABOLIC PANEL
ALBUMIN: 2.9 g/dL — AB (ref 3.5–5.0)
ALT: 17 U/L (ref 14–54)
AST: 20 U/L (ref 15–41)
Alkaline Phosphatase: 48 U/L (ref 38–126)
Anion gap: 7 (ref 5–15)
BILIRUBIN TOTAL: 0.9 mg/dL (ref 0.3–1.2)
BUN: 16 mg/dL (ref 6–20)
CO2: 21 mmol/L — ABNORMAL LOW (ref 22–32)
CREATININE: 0.97 mg/dL (ref 0.44–1.00)
Calcium: 8.1 mg/dL — ABNORMAL LOW (ref 8.9–10.3)
Chloride: 107 mmol/L (ref 101–111)
GFR calc Af Amer: 57 mL/min — ABNORMAL LOW (ref >60)
GFR, EST NON AFRICAN AMERICAN: 50 mL/min — AB (ref >60)
Glucose, Bld: 119 mg/dL — ABNORMAL HIGH (ref 65–99)
Potassium: 3.7 mmol/L (ref 3.5–5.1)
SODIUM: 135 mmol/L (ref 135–145)
TOTAL PROTEIN: 6.3 g/dL — AB (ref 6.5–8.1)

## 2015-02-21 LAB — LACTIC ACID, PLASMA
LACTIC ACID, VENOUS: 1.1 mmol/L (ref 0.5–2.0)
LACTIC ACID, VENOUS: 1.1 mmol/L (ref 0.5–2.0)

## 2015-02-21 LAB — HEMOGLOBIN AND HEMATOCRIT, BLOOD
HCT: 35.6 % — ABNORMAL LOW (ref 36.0–46.0)
HEMOGLOBIN: 11.9 g/dL — AB (ref 12.0–15.0)

## 2015-02-21 MED ORDER — ALBUTEROL SULFATE (2.5 MG/3ML) 0.083% IN NEBU
2.5000 mg | INHALATION_SOLUTION | Freq: Four times a day (QID) | RESPIRATORY_TRACT | Status: DC | PRN
Start: 2015-02-21 — End: 2015-02-23

## 2015-02-21 NOTE — Progress Notes (Signed)
[facility] PROGRESS NOTE  Shelia Black [id number]:[id number] DOB: 1922-01-24 DOA: 02/20/2015  PCP: [name], PA-C  Brief HPI: 82 year old Caucasian female with a past medical history of type 2 diabetes, hypothyroidism, presented with complaints of abdominal pain. CT scan of the abdomen and pelvis revealed findings suspicious for diverticulitis. No free air or abscess was noted. Patient was hospitalized for further management.  Past medical history:  Past Medical History  Diagnosis Date  . Thyroid disease   . Diabetes mellitus without complication ([place])   . Cataract   . Chronic cough   . Diverticular disease   . Hypothyroidism   . GERD (gastroesophageal reflux disease)   . Arthritis     Consultants: None  Procedures: None  Antibiotics: Ciprofloxacin and Flagyl  Subjective: Patient had a bowel movement this morning with which she had a lot of cramping pain. No feels better. She is noted to be dyspneic. She mentions that she gets short of breath with exertion even at home. Denies any chest pain. No nausea, vomiting.  Objective: Vital Signs  Filed Vitals:   02/20/15 2046 02/21/15 0007 02/21/15 0017 02/21/15 0417  BP: 111/76 112/61 104/44 128/30  Pulse: 69 65 70 76  Temp:   97.5 F (36.4 C) 98 F (36.7 C)  TempSrc:   Oral Oral  Resp: 21 17 18 19   Height:   5' 4"  (1.626 m)   Weight:   69.99 kg (154 lb 4.8 oz)   SpO2: 96% 95% 97% 95%    Intake/Output Summary (Last 24 hours) at 02/21/15 1304 Last data filed at 02/21/15 0600  Gross per 24 hour  Intake 803.33 ml  Output      0 ml  Net 803.33 ml   Filed Weights   02/21/15 0017  Weight: 69.99 kg (154 lb 4.8 oz)    General appearance: alert, cooperative, appears stated age and no distress Resp: Crackles noted bilateral lung fields at the bases. No wheezing. No rhonchi. Cardio: regular rate and rhythm, S1, S2 normal, no murmur, click, rub or gallop GI: Abdomen is soft, nondistended. No tenderness is  elicited at this time. No masses or organomegaly. Bowel sounds are present. Extremities: extremities normal, atraumatic, no cyanosis or edema Neurologic: Alert and oriented 3. No focal neurological deficits are noted.  Lab Results:  Basic Metabolic Panel:  Recent Labs Lab 02/20/15 1328 02/21/15 0156  NA 134* 135  K 4.5 3.7  CL 100* 107  CO2 23 21*  GLUCOSE 144* 119*  BUN 17 16  CREATININE 1.10* 0.97  CALCIUM 9.0 8.1*   Liver Function Tests:  Recent Labs Lab 02/20/15 1328 02/21/15 0156  AST 17 20  ALT 17 17  ALKPHOS 55 48  BILITOT 0.9 0.9  PROT 7.4 6.3*  ALBUMIN 3.4* 2.9*    Recent Labs Lab 02/20/15 1328  LIPASE 18   CBC:  Recent Labs Lab 02/20/15 1328 02/20/15 1833 02/21/15 0001 02/21/15 0156  WBC 12.7* 11.3*  --  9.9  NEUTROABS  --  7.1  --  6.0  HGB 13.3 12.7 11.9* 11.7*  HCT 40.6 38.9 35.6* 36.3  MCV 95.8 94.6  --  96.5  PLT 231 226  --  222   CBG:  Recent Labs Lab 02/21/15 1150  GLUCAP 164*    No results found for this or any previous visit (from the past 240 hour(s)).    Studies/Results: Dg Chest 2 View  02/21/2015  CLINICAL DATA:  Chronic cough. EXAM: CHEST  2 VIEW COMPARISON:  04/29/2014. FINDINGS: The cardiac silhouette remains mildly enlarged. Mildly prominent pulmonary vasculature and interstitial markings. Stable scarring at the lung bases. Mild central peribronchial thickening. Mild thoracic spine degenerative changes. IMPRESSION: 1. Interval mild chronic bronchitic changes. 2. Interval mild pulmonary vascular congestion. 3. Stable interstitial fibrosis and bibasilar scarring. Electronically Signed   By: Claudie Revering M.D.   On: 02/21/2015 12:19   Ct Abdomen Pelvis W Contrast  02/20/2015  CLINICAL DATA:  Worsening lower abdominal pain and leukocytosis. Clinical concern for diverticulitis. EXAM: CT ABDOMEN AND PELVIS WITH CONTRAST TECHNIQUE: Multidetector CT imaging of the abdomen and pelvis was performed using the standard protocol  following bolus administration of intravenous contrast. CONTRAST:  77m OMNIPAQUE IOHEXOL 300 MG/ML  SOLN COMPARISON:  04/29/2014 FINDINGS: Lower chest: No acute findings. Chronic interstitial lung changes with paraseptal bullous formation are seen. The heart size is mildly enlarged. There is a small hiatal hernia. Hepatobiliary: No masses or other significant abnormality. The gallbladder is normal. Pancreas: No mass, inflammatory changes, or other significant abnormality. The pancreas is atrophic. Spleen: Within normal limits in size and appearance. Adrenals/Urinary Tract: No masses identified. No evidence of hydronephrosis. 21 mm partially exophytic left renal cyst is noted. Stomach/Bowel: No evidence of obstruction, or abnormal fluid collections. There are multiple sigmoid diverticula with evidence of pericolonic inflammatory changes in the distal sigmoid colon centered around enlarged diverticulum, suggestive of associated diverticulitis, image 73, series 2. There is no evidence of free gas to suggest perforation or enhancing fluid collections to abscess formation. Vascular/Lymphatic: No pathologically enlarged lymph nodes. No evidence of abdominal aortic aneurysm. Atherosclerotic calcifications of the aorta and its main branches are seen. Reproductive: No mass or other significant abnormality. Other: None. Musculoskeletal: No suspicious bone lesions identified. Multilevel osteoarthritic changes worse at L5-S1 noted. IMPRESSION: Extensive sigmoid diverticulosis, with evidence of diverticulitis in the distal sigmoid colon, without perforation or abscess formation. Small hiatal hernia. Chronic interstitial lung changes in the lung bases. Benign-appearing left renal cyst. Electronically Signed   By: DFidela SalisburyM.D.   On: 02/20/2015 20:55    Medications:  Scheduled: . acidophilus  1 capsule Oral Daily  . ciprofloxacin  400 mg Intravenous Q12H  . insulin aspart  0-9 Units Subcutaneous TID WC  .  levothyroxine  125 mcg Oral QAC breakfast  . metronidazole  500 mg Intravenous Once  . metronidazole  500 mg Intravenous Q8H  . pantoprazole  40 mg Oral Daily   Continuous:  PDGL:OVFIEPPIRJJOA**OR** acetaminophen, albuterol, chlorpheniramine-HYDROcodone, HYDROcodone-acetaminophen, morphine injection, ondansetron **OR** ondansetron (ZOFRAN) IV, zolpidem  Assessment/Plan:  Principal Problem:   Diverticulitis Active Problems:   UTI (urinary tract infection)   Hyponatremia   Hyperglycemia   Diabetes mellitus type 2 in obese (HCC)   CKD (chronic kidney disease) stage 3, GFR 30-59 ml/min   UTI (lower urinary tract infection)    Acute uncomplicated diverticulitis Sigmoid diverticulitis without free air or associated abscess. Continue ciprofloxacin and Flagyl. Continue clear liquids. Cut back on IV fluids. Apparently she did have an episode of hematochezia. None in the hospital. Hemoglobin is stable.  Dyspnea Crackles noted on examination. CT scan report was reviewed. Lung images do show evidence for interstitial lung disease. Chest x-ray was done, which suggest the same. This is the most likely reason for her dyspnea. In any case, we'll cut back on IV fluid. Hold off on Lasix for now.  UTI  UA with leukocytes and pos nitrite, culture pending. Cipro empirically as above; f/u culture result and modify accordingly prn  Hyperglycemia Serum glucose elevated on presentation. It doesn't appear that she has known history of diabetes. We will check HbA1c. Continue to monitor CBGs.  Hyponatremia  Improved.  CKD stage 3 Appears stable at this time. Monitor urine output. Avoid nephrotoxins.  DVT Prophylaxis: SCDs    Code Status: Full code  Family Communication: Discussed with the patient  Disposition Plan: Continue management as outlined above.    LOS: 1 day   [facility] Pager [phone] 02/21/2015, 1:04 PM  If 7PM-7AM, please contact night-coverage at  [email or web address], password [email or web address]

## 2015-02-21 NOTE — Evaluation (Signed)
Physical Therapy Evaluation Patient Details Name: Shelia Black MRN: [id number] DOB: [birth year] Today's Date: 02/21/2015   History of Present Illness  82 yo patient admitted 02/20/15 with bloody stool and abdominal pain./ admitted for w/u of diverticulitis.  Clinical Impression  Very pleasant  Female who is mobilizing well in the room. Patient will benefit from PT to address problems listed in the note below.  May not need HHPT by DC.    Follow Up Recommendations Home health PT;Supervision - Intermittent (may not require)    Equipment Recommendations  None recommended by PT    Recommendations for Other Services       Precautions / Restrictions Precautions Precautions: Fall      Mobility  Bed Mobility Overal bed mobility: Needs Assistance Bed Mobility: Supine to Sit     Supine to sit: Supervision        Transfers Overall transfer level: Needs assistance Equipment used: 1 person hand held assist Transfers: Sit to/from Stand;Stand Pivot Transfers Sit to Stand: Min assist         General transfer comment: steady assist to stand up.   Ambulation/Gait Ambulation/Gait assistance: Min assist Ambulation Distance (Feet): 5 Feet Assistive device: 1 person hand held assist Gait Pattern/deviations: Step-through pattern     General Gait Details: mildly unsteady gait, lunch present .did not ambulate any further.  Stairs            Wheelchair Mobility    Modified Rankin (Stroke Patients Only)       Balance Overall balance assessment: Needs assistance Sitting-balance support: No upper extremity supported;Feet supported Sitting balance-Leahy Scale: Good     Standing balance support: No upper extremity supported;During functional activity Standing balance-Leahy Scale: Fair                               Pertinent Vitals/Pain Pain Assessment: No/denies pain    Home Living Family/patient expects to be discharged to:: Private  residence Living Arrangements: Children Available Help at Discharge: Family;Available 24 hours/day Type of Home: House Home Access: Stairs to enter Entrance Stairs-Rails: Doctor, general practice of Steps: 3 Home Layout: One level Home Equipment: None      Prior Function Level of Independence: Independent               Hand Dominance        Extremity/Trunk Assessment   Upper Extremity Assessment: Generalized weakness           Lower Extremity Assessment: Generalized weakness      Cervical / Trunk Assessment: Normal  Communication   Communication: HOH  Cognition Arousal/Alertness: Awake/alert Behavior During Therapy: WFL for tasks assessed/performed Overall Cognitive Status: Within Functional Limits for tasks assessed                      General Comments      Exercises        Assessment/Plan    PT Assessment Patient needs continued PT services  PT Diagnosis Generalized weakness;Difficulty walking   PT Problem List Decreased strength;Decreased activity tolerance;Decreased mobility  PT Treatment Interventions DME instruction;Gait training;Functional mobility training;Therapeutic activities;Therapeutic exercise;Patient/family education   PT Goals (Current goals can be found in the Care Plan section) Acute Rehab PT Goals Patient Stated Goal: to go home soon. PT Goal Formulation: With patient Time For Goal Achievement: 03/07/15 Potential to Achieve Goals: Good    Frequency Min 3X/week   Barriers to discharge  Co-evaluation               End of Session   Activity Tolerance: Patient tolerated treatment well Patient left: in chair;with call bell/phone within reach;with chair alarm set Nurse Communication: Mobility status         Time: 1478-2956 PT Time Calculation (min) (ACUTE ONLY): 10 min   Charges:   PT Evaluation $PT Eval Low Complexity: 1 Procedure     PT G CodesRada Hay 02/21/2015, 12:36 PM  Blanchard Kelch PT [phone]

## 2015-02-22 DIAGNOSIS — N183 Chronic kidney disease, stage 3 (moderate): Secondary | ICD-10-CM

## 2015-02-22 DIAGNOSIS — E119 Type 2 diabetes mellitus without complications: Secondary | ICD-10-CM

## 2015-02-22 DIAGNOSIS — E669 Obesity, unspecified: Secondary | ICD-10-CM

## 2015-02-22 DIAGNOSIS — R739 Hyperglycemia, unspecified: Secondary | ICD-10-CM

## 2015-02-22 DIAGNOSIS — E871 Hypo-osmolality and hyponatremia: Secondary | ICD-10-CM

## 2015-02-22 DIAGNOSIS — K5733 Diverticulitis of large intestine without perforation or abscess with bleeding: Principal | ICD-10-CM

## 2015-02-22 LAB — CBC WITH DIFFERENTIAL/PLATELET
Basophils Absolute: 0 10*3/uL (ref 0.0–0.1)
Basophils Relative: 0 %
EOS ABS: 0.4 10*3/uL (ref 0.0–0.7)
Eosinophils Relative: 7 %
HEMATOCRIT: 32.6 % — AB (ref 36.0–46.0)
HEMOGLOBIN: 10.6 g/dL — AB (ref 12.0–15.0)
LYMPHS ABS: 2 10*3/uL (ref 0.7–4.0)
LYMPHS PCT: 30 %
MCH: 31.5 pg (ref 26.0–34.0)
MCHC: 32.5 g/dL (ref 30.0–36.0)
MCV: 97 fL (ref 78.0–100.0)
MONOS PCT: 11 %
Monocytes Absolute: 0.7 10*3/uL (ref 0.1–1.0)
NEUTROS PCT: 52 %
Neutro Abs: 3.3 10*3/uL (ref 1.7–7.7)
Platelets: 228 10*3/uL (ref 150–400)
RBC: 3.36 MIL/uL — AB (ref 3.87–5.11)
RDW: 13.6 % (ref 11.5–15.5)
WBC: 6.5 10*3/uL (ref 4.0–10.5)

## 2015-02-22 LAB — GLUCOSE, CAPILLARY
GLUCOSE-CAPILLARY: 120 mg/dL — AB (ref 65–99)
GLUCOSE-CAPILLARY: 223 mg/dL — AB (ref 65–99)
GLUCOSE-CAPILLARY: 113 mg/dL — AB (ref 65–99)
GLUCOSE-CAPILLARY: 112 mg/dL — AB (ref 65–99)

## 2015-02-22 LAB — BASIC METABOLIC PANEL
Anion gap: 10 (ref 5–15)
BUN: 8 mg/dL (ref 6–20)
CHLORIDE: 107 mmol/L (ref 101–111)
CO2: 23 mmol/L (ref 22–32)
CREATININE: 0.86 mg/dL (ref 0.44–1.00)
Calcium: 8.3 mg/dL — ABNORMAL LOW (ref 8.9–10.3)
GFR calc Af Amer: >60 mL/min (ref >60)
GFR calc non Af Amer: 57 mL/min — ABNORMAL LOW (ref >60)
GLUCOSE: 121 mg/dL — AB (ref 65–99)
POTASSIUM: 3.7 mmol/L (ref 3.5–5.1)
SODIUM: 140 mmol/L (ref 135–145)

## 2015-02-22 LAB — HEMOGLOBIN A1C
Hgb A1c MFr Bld: 7.7 % — ABNORMAL HIGH (ref 4.8–5.6)
Mean Plasma Glucose: 174 mg/dL

## 2015-02-22 MED ORDER — METRONIDAZOLE 500 MG PO TABS
500.0000 mg | ORAL_TABLET | Freq: Three times a day (TID) | ORAL | Status: DC
  Administered 2015-02-22 – 2015-02-23 (×3): 500 mg via ORAL
  Filled 2015-02-22 (×3): qty 1

## 2015-02-22 MED ORDER — CIPROFLOXACIN HCL 500 MG PO TABS
500.0000 mg | ORAL_TABLET | Freq: Two times a day (BID) | ORAL | Status: DC
  Filled 2015-02-22: qty 1

## 2015-02-22 MED ORDER — CIPROFLOXACIN HCL 500 MG PO TABS
500.0000 mg | ORAL_TABLET | Freq: Two times a day (BID) | ORAL | Status: DC
  Administered 2015-02-22 – 2015-02-23 (×2): 500 mg via ORAL
  Filled 2015-02-22 (×2): qty 1

## 2015-02-22 NOTE — Progress Notes (Signed)
[facility] PROGRESS NOTE    Progress Note   Lillie Portner RAX:[id number] DOB: Jul 13, 1921 DOA: 02/20/2015 PCP: [name], PA-C   Brief Narrative:   Shelia Black is an 82 y.o. female past medical history of type 2 diabetes presents with abdominal pain CT scan of the abdomen pelvis revealed suspicion for diverticulitis  Assessment/Plan:   Acute uncomplicated  Diverticulitis: Will change antibiotic regimen to oral ciprofloxacin and Flagyl. She had a bowel movement this morning there was no blood. There was a mild drop in hemoglobin likely hemodilution we'll recheck CBC in the morning.  Dyspnea: No resolved KVO fluids.  UTI: Cipro should cover any bacteria in the urine.  Hyponatremia: Resolved with IV hydration.  CKD stage III: Appeared to be stable at this time.   DVT Prophylaxis - Lovenox ordered.  Family Communication: none Disposition Plan: Home when stable. Code Status:     Code Status Orders        Start     Ordered   02/20/15 2315  Full code   Continuous     02/20/15 2320    Code Status History    Date Active Date Inactive Code Status Order ID Comments User Context   04/29/2014 11:57 AM 05/01/2014  5:00 PM Full Code [id number]  Robbie Lis, MD ED        IV Access:    Peripheral IV   Procedures and diagnostic studies:   Dg Chest 2 View  02/21/2015  CLINICAL DATA:  Chronic cough. EXAM: CHEST  2 VIEW COMPARISON:  04/29/2014. FINDINGS: The cardiac silhouette remains mildly enlarged. Mildly prominent pulmonary vasculature and interstitial markings. Stable scarring at the lung bases. Mild central peribronchial thickening. Mild thoracic spine degenerative changes. IMPRESSION: 1. Interval mild chronic bronchitic changes. 2. Interval mild pulmonary vascular congestion. 3. Stable interstitial fibrosis and bibasilar scarring. Electronically Signed   By: Claudie Revering M.D.   On: 02/21/2015 12:19   Ct Abdomen Pelvis W  Contrast  02/20/2015  CLINICAL DATA:  Worsening lower abdominal pain and leukocytosis. Clinical concern for diverticulitis. EXAM: CT ABDOMEN AND PELVIS WITH CONTRAST TECHNIQUE: Multidetector CT imaging of the abdomen and pelvis was performed using the standard protocol following bolus administration of intravenous contrast. CONTRAST:  64m OMNIPAQUE IOHEXOL 300 MG/ML  SOLN COMPARISON:  04/29/2014 FINDINGS: Lower chest: No acute findings. Chronic interstitial lung changes with paraseptal bullous formation are seen. The heart size is mildly enlarged. There is a small hiatal hernia. Hepatobiliary: No masses or other significant abnormality. The gallbladder is normal. Pancreas: No mass, inflammatory changes, or other significant abnormality. The pancreas is atrophic. Spleen: Within normal limits in size and appearance. Adrenals/Urinary Tract: No masses identified. No evidence of hydronephrosis. 21 mm partially exophytic left renal cyst is noted. Stomach/Bowel: No evidence of obstruction, or abnormal fluid collections. There are multiple sigmoid diverticula with evidence of pericolonic inflammatory changes in the distal sigmoid colon centered around enlarged diverticulum, suggestive of associated diverticulitis, image 73, series 2. There is no evidence of free gas to suggest perforation or enhancing fluid collections to abscess formation. Vascular/Lymphatic: No pathologically enlarged lymph nodes. No evidence of abdominal aortic aneurysm. Atherosclerotic calcifications of the aorta and its main branches are seen. Reproductive: No mass or other significant abnormality. Other: None. Musculoskeletal: No suspicious bone lesions identified. Multilevel osteoarthritic changes worse at L5-S1 noted. IMPRESSION: Extensive sigmoid diverticulosis, with evidence of diverticulitis in the distal sigmoid colon, without perforation or abscess formation. Small hiatal hernia. Chronic interstitial lung changes in the lung bases.  Benign-appearing left renal cyst. Electronically Signed   By: Fidela Salisbury M.D.   On: 02/20/2015 20:55     Medical Consultants:    None.  Anti-Infectives:   Anti-infectives    Start     Dose/Rate Route Frequency Ordered Stop   02/20/15 2330  ciprofloxacin (CIPRO) IVPB 400 mg     400 mg 200 mL/hr over 60 Minutes Intravenous Every 12 hours 02/20/15 2320     02/20/15 2330  metroNIDAZOLE (FLAGYL) IVPB 500 mg     500 mg 100 mL/hr over 60 Minutes Intravenous Every 8 hours 02/20/15 2320     02/20/15 2145  metroNIDAZOLE (FLAGYL) IVPB 500 mg     500 mg 100 mL/hr over 60 Minutes Intravenous  Once 02/20/15 2132     02/20/15 2145  ciprofloxacin (CIPRO) IVPB 400 mg     400 mg 200 mL/hr over 60 Minutes Intravenous  Once 02/20/15 2132 02/20/15 2323      Subjective:    [name] no complaints tolerating diet will like to advance to solid diet.  Objective:    Filed Vitals:   02/21/15 0417 02/21/15 1454 02/21/15 2108 02/22/15 0602  BP: 128/30 103/46 93/42 108/52  Pulse: 76 72 69 69  Temp: 98 F (36.7 C) 98 F (36.7 C) 98.3 F (36.8 C) 98.1 F (36.7 C)  TempSrc: Oral Oral Oral Oral  Resp: 19 18 20 16   Height:      Weight:      SpO2: 95% 100% 97% 94%    Intake/Output Summary (Last 24 hours) at 02/22/15 1148 Last data filed at 02/22/15 0928  Gross per 24 hour  Intake    960 ml  Output    675 ml  Net    285 ml   Filed Weights   02/21/15 0017  Weight: 69.99 kg (154 lb 4.8 oz)    Exam: Gen:  NAD Cardiovascular:  RRR, No M/R/G Chest and lungs:   CTAB Abdomen:  Abdomen soft, NT/ND, + BS Extremities:  No C/E/C   Data Reviewed:    Labs: Basic Metabolic Panel:  Recent Labs Lab 02/20/15 1328 02/21/15 0156 02/22/15 0413  NA 134* 135 140  K 4.5 3.7 3.7  CL 100* 107 107  CO2 23 21* 23  GLUCOSE 144* 119* 121*  BUN 17 16 8   CREATININE 1.10* 0.97 0.86  CALCIUM 9.0 8.1* 8.3*   GFR Estimated Creatinine Clearance: 40.9 mL/min (by C-G formula  based on Cr of 0.86). Liver Function Tests:  Recent Labs Lab 02/20/15 1328 02/21/15 0156  AST 17 20  ALT 17 17  ALKPHOS 55 48  BILITOT 0.9 0.9  PROT 7.4 6.3*  ALBUMIN 3.4* 2.9*    Recent Labs Lab 02/20/15 1328  LIPASE 18   No results for input(s): AMMONIA in the last 168 hours. Coagulation profile No results for input(s): INR, PROTIME in the last 168 hours.  CBC:  Recent Labs Lab 02/20/15 1328 02/20/15 1833 02/21/15 0001 02/21/15 0156 02/22/15 0413  WBC 12.7* 11.3*  --  9.9 6.5  NEUTROABS  --  7.1  --  6.0 3.3  HGB 13.3 12.7 11.9* 11.7* 10.6*  HCT 40.6 38.9 35.6* 36.3 32.6*  MCV 95.8 94.6  --  96.5 97.0  PLT 231 226  --  222 228   Cardiac Enzymes: No results for input(s): CKTOTAL, CKMB, CKMBINDEX, TROPONINI in the last 168 hours. BNP (last 3 results) No results for input(s): PROBNP in the last 8760 hours. CBG:  Recent Labs Lab  02/21/15 1150 02/21/15 1756 02/21/15 2102  GLUCAP 164* 79 194*   D-Dimer: No results for input(s): DDIMER in the last 72 hours. Hgb A1c:  Recent Labs  02/20/15 2359  HGBA1C 7.7*   Lipid Profile: No results for input(s): CHOL, HDL, LDLCALC, TRIG, CHOLHDL, LDLDIRECT in the last 72 hours. Thyroid function studies: No results for input(s): TSH, T4TOTAL, T3FREE, THYROIDAB in the last 72 hours.  Invalid input(s): FREET3 Anemia work up: No results for input(s): VITAMINB12, FOLATE, FERRITIN, TIBC, IRON, RETICCTPCT in the last 72 hours. Sepsis Labs:  Recent Labs Lab 02/20/15 1328 02/20/15 1833 02/20/15 1846 02/20/15 2125 02/21/15 0001 02/21/15 0156 02/22/15 0413  WBC 12.7* 11.3*  --   --   --  9.9 6.5  LATICACIDVEN  --   --  1.05 1.18 1.1 1.1  --    Microbiology Recent Results (from the past 240 hour(s))  Urine culture     Status: None (Preliminary result)   Collection Time: 02/20/15  8:26 PM  Result Value Ref Range Status   Specimen Description URINE, RANDOM  Final   Special Requests NONE  Final   Culture    Final    40,000 COLONIES/ml GRAM NEGATIVE RODS 50,000 COLONIES/mL GRAM POSITIVE COCCI Performed at [facility]    Report Status PENDING  Incomplete     Medications:   . acidophilus  1 capsule Oral Daily  . ciprofloxacin  400 mg Intravenous Q12H  . insulin aspart  0-9 Units Subcutaneous TID WC  . levothyroxine  125 mcg Oral QAC breakfast  . metronidazole  500 mg Intravenous Once  . metronidazole  500 mg Intravenous Q8H  . pantoprazole  40 mg Oral Daily   Continuous Infusions:   Time spent: 15 min   LOS: 2 days   Charlynne Cousins  [facility] Pager [phone]  *Please refer to [email or web address], password [email or web address] to get updated schedule on who will round on this patient, as hospitalists switch teams weekly. If 7PM-7AM, please contact night-coverage at [email or web address], password [email or web address] for any overnight needs.  02/22/2015, 11:49 AM

## 2015-02-22 NOTE — Care Management Note (Signed)
Case Management Note  Patient Details  Name: Tashera Montalvo MRN: [id number] Date of Birth: 1922/01/07  Subjective/Objective:         82 yo admitted with Diverticulitis.           Action/Plan: Pt is from [place] but is here staying with her daughter until May. Plan is to go back to daughter's home.  Expected Discharge Date:  02/24/15               Expected Discharge Plan:  Home/Self Care  In-House Referral:     Discharge planning Services  CM Consult  Post Acute Care Choice:    Choice offered to:     DME Arranged:    DME Agency:     HH Arranged:    HH Agency:     Status of Service:  In process, will continue to follow  Medicare Important Message Given:    Date Medicare IM Given:    Medicare IM give by:    Date Additional Medicare IM Given:    Additional Medicare Important Message give by:     If discussed at [place] of Stay Meetings, dates discussed:    Additional Comments: This CM met with pt at bedside to discuss DC needs. Pt given PT recommendations and asked if she would like to receive HHPT at home when she discharges. Pt politely declines [facility] services at this time. [facility] provider list left with pt and pt encouraged to have RN contact CM if she changes her mind. No other DC needs communicated. CM will continue to follow. Lynnell Catalan, RN 02/22/2015, 11:52 AM

## 2015-02-23 LAB — CBC WITH DIFFERENTIAL/PLATELET
BASOS ABS: 0 10*3/uL (ref 0.0–0.1)
Basophils Relative: 0 %
Eosinophils Absolute: 0.6 10*3/uL (ref 0.0–0.7)
Eosinophils Relative: 7 %
HCT: 35.1 % — ABNORMAL LOW (ref 36.0–46.0)
HEMOGLOBIN: 11.5 g/dL — AB (ref 12.0–15.0)
LYMPHS ABS: 2.3 10*3/uL (ref 0.7–4.0)
LYMPHS PCT: 31 %
MCH: 31.3 pg (ref 26.0–34.0)
MCHC: 32.8 g/dL (ref 30.0–36.0)
MCV: 95.6 fL (ref 78.0–100.0)
Monocytes Absolute: 0.9 10*3/uL (ref 0.1–1.0)
Monocytes Relative: 12 %
NEUTROS ABS: 3.8 10*3/uL (ref 1.7–7.7)
NEUTROS PCT: 50 %
Platelets: 236 10*3/uL (ref 150–400)
RBC: 3.67 MIL/uL — AB (ref 3.87–5.11)
RDW: 13.3 % (ref 11.5–15.5)
WBC: 7.6 10*3/uL (ref 4.0–10.5)

## 2015-02-23 LAB — GLUCOSE, CAPILLARY: GLUCOSE-CAPILLARY: 162 mg/dL — AB (ref 65–99)

## 2015-02-23 MED ORDER — METRONIDAZOLE 500 MG PO TABS: 500.0000 mg | ORAL_TABLET | Freq: Three times a day (TID) | ORAL | Status: DC

## 2015-02-23 MED ORDER — CIPROFLOXACIN HCL 500 MG PO TABS: 500.0000 mg | ORAL_TABLET | Freq: Two times a day (BID) | ORAL | Status: DC

## 2015-02-23 NOTE — Discharge Summary (Signed)
Physician Discharge Summary  Shelia Black PPI:[id number] DOB: [birth year] DOA: 02/20/2015  PCP: [name], PA-C  Admit date: 02/20/2015 Discharge date: 02/23/2015  Time spent: 35 minutes  Recommendations for Outpatient Follow-up:  1. Primary care doctor in 2 weeks. 2. She will go home with physical therapy. 3. Follow-up with PCP will need a recheck in her A1c and fasting blood glucoses an outpatient. They need to be started on oral hypoglycemic agents as an outpatient.   Discharge Diagnoses:  Principal Problem:   Diverticulitis of colon with bleeding Active Problems:   UTI (urinary tract infection)   Hyponatremia   Hyperglycemia   Diabetes mellitus type 2 in obese (HCC)   CKD (chronic kidney disease) stage 3, GFR 30-59 ml/min   UTI (lower urinary tract infection)   Discharge Condition: stable  Diet recommendation: regular  Filed Weights   02/21/15 0017  Weight: 69.99 kg (154 lb 4.8 oz)    History of present illness:  82 y.o. female with PMH of type 2 diabetes and hypothyroidism who presents to the ED with 3 days of abdominal pain with 1 bloody stool on the day of her presentation. She had been in her usual state of health up until 02/18/2015 when she developed lower abdominal pain. She described the pain as sharp, constant, severe, and localized to the lower abdomen. Pain is worse with certain movements and no alleviating factors been identified. Patient denies any associated fever, chills, vomiting, or diarrhea. She denies chest pain, palpitations, or headache. She notes that earlier today she had 1 bowel movement that was frankly bloody. She saw her outpatient physician for this earlier today and was directed to the ED.   Hospital Course:  Acute uncomplicated diverticulitis: She started empirically on ciprofloxacin and Flagyl place nothing by mouth.  Once her pain improved she was allowed clear liquid diet and ciprofloxacin were changed to orally she will  continue for a total of 10 days.  Dyspnea: Crackles were noted on exam, chest x-ray dated showed mild interstitial infiltrate. IV fluids were held she was given a single dose of Lasix and chest x-ray was repeated which showed some improvement.  Possible UTI: Her UA was positive for leukocytes and nitrates, urine culture grew listed 100,000 colonies.  Hypoglycemia: Her A1c was 7.7. She was on prednisone at home. This was DC'd on admission.   Hyponatremia: Likely prerenal does resolve with IV fluid hydration.  CKD stage III: Remained stable.  Procedures:  CT abd and pelvis CXR Consultations:  none  Discharge Exam: Filed Vitals:   02/22/15 2219 02/23/15 0522  BP: 92/72 109/51  Pulse: 62 62  Temp: 98 F (36.7 C) 98 F (36.7 C)  Resp: 18 20    General: A&O x3 Cardiovascular: RRR Respiratory: good air movement CTA B/L  Discharge Instructions   Discharge Instructions    Diet - low sodium heart healthy    Complete by:  As directed      Increase activity slowly    Complete by:  As directed           Current Discharge Medication List    START taking these medications   Details  ciprofloxacin (CIPRO) 500 MG tablet Take 1 tablet (500 mg total) by mouth 2 (two) times daily. Qty: 22 tablet, Refills: 0    metroNIDAZOLE (FLAGYL) 500 MG tablet Take 1 tablet (500 mg total) by mouth every 8 (eight) hours. Qty: 33 tablet, Refills: 0      CONTINUE these medications which have NOT CHANGED  Details  albuterol (PROVENTIL HFA;VENTOLIN HFA) 108 (90 Base) MCG/ACT inhaler Inhale 1 puff into the lungs every 6 (six) hours as needed for wheezing or shortness of breath.    diphenoxylate-atropine (LOMOTIL) 2.5-0.025 MG tablet Take 1 tablet by mouth 4 (four) times daily as needed for diarrhea or loose stools. diarrhea    Hydrocodone-Chlorpheniramine 5-4 MG/5ML SOLN Take 5 mLs by mouth 2 (two) times daily as needed (cough).     ibuprofen (ADVIL,MOTRIN) 200 MG tablet Take 800 mg  by mouth every 4 (four) hours as needed for moderate pain.    levothyroxine (SYNTHROID, LEVOTHROID) 125 MCG tablet Take 125 mcg by mouth daily before breakfast.    omeprazole (PRILOSEC) 20 MG capsule Take 20 mg by mouth daily.    Probiotic Product (ALIGN PO) Take 1 tablet by mouth daily.    zolpidem (AMBIEN) 5 MG tablet Take 2.5 mg by mouth at bedtime as needed for sleep.       Allergies  Allergen Reactions  . Codeine     Nausea and vomiting  . Percocet [Oxycodone-Acetaminophen] Nausea And Vomiting  . Penicillins Hives and Rash    Has patient had a PCN reaction causing immediate rash, facial/tongue/throat swelling, SOB or lightheadedness with hypotension: no Has patient had a PCN reaction causing severe rash involving mucus membranes or skin necrosis: unknown Has patient had a PCN reaction that required hospitalization : unknown Has patient had a PCN reaction occurring within the last 10 years: no If all of the above answers are "NO", then may proceed with Cephalosporin use.    Follow-up Information    Schedule an appointment as soon as possible for a visit with [name], PA-C.   Specialty:  Family Medicine   Why:  As needed   Contact information:   [place] [address] [phone]       Schedule an appointment as soon as possible for a visit with Dr Waymon Budge in [place] .   Why:  As needed       The results of significant diagnostics from this hospitalization (including imaging, microbiology, ancillary and laboratory) are listed below for reference.    Significant Diagnostic Studies: Dg Chest 2 View  02/21/2015  CLINICAL DATA:  Chronic cough. EXAM: CHEST  2 VIEW COMPARISON:  04/29/2014. FINDINGS: The cardiac silhouette remains mildly enlarged. Mildly prominent pulmonary vasculature and interstitial markings. Stable scarring at the lung bases. Mild central peribronchial thickening. Mild thoracic spine degenerative changes. IMPRESSION:  1. Interval mild chronic bronchitic changes. 2. Interval mild pulmonary vascular congestion. 3. Stable interstitial fibrosis and bibasilar scarring. Electronically Signed   By: Claudie Revering M.D.   On: 02/21/2015 12:19   Ct Abdomen Pelvis W Contrast  02/20/2015  CLINICAL DATA:  Worsening lower abdominal pain and leukocytosis. Clinical concern for diverticulitis. EXAM: CT ABDOMEN AND PELVIS WITH CONTRAST TECHNIQUE: Multidetector CT imaging of the abdomen and pelvis was performed using the standard protocol following bolus administration of intravenous contrast. CONTRAST:  63m OMNIPAQUE IOHEXOL 300 MG/ML  SOLN COMPARISON:  04/29/2014 FINDINGS: Lower chest: No acute findings. Chronic interstitial lung changes with paraseptal bullous formation are seen. The heart size is mildly enlarged. There is a small hiatal hernia. Hepatobiliary: No masses or other significant abnormality. The gallbladder is normal. Pancreas: No mass, inflammatory changes, or other significant abnormality. The pancreas is atrophic. Spleen: Within normal limits in size and appearance. Adrenals/Urinary Tract: No masses identified. No evidence of hydronephrosis. 21 mm partially exophytic left renal cyst is noted.  Stomach/Bowel: No evidence of obstruction, or abnormal fluid collections. There are multiple sigmoid diverticula with evidence of pericolonic inflammatory changes in the distal sigmoid colon centered around enlarged diverticulum, suggestive of associated diverticulitis, image 73, series 2. There is no evidence of free gas to suggest perforation or enhancing fluid collections to abscess formation. Vascular/Lymphatic: No pathologically enlarged lymph nodes. No evidence of abdominal aortic aneurysm. Atherosclerotic calcifications of the aorta and its main branches are seen. Reproductive: No mass or other significant abnormality. Other: None. Musculoskeletal: No suspicious bone lesions identified. Multilevel osteoarthritic changes worse at  L5-S1 noted. IMPRESSION: Extensive sigmoid diverticulosis, with evidence of diverticulitis in the distal sigmoid colon, without perforation or abscess formation. Small hiatal hernia. Chronic interstitial lung changes in the lung bases. Benign-appearing left renal cyst. Electronically Signed   By: Fidela Salisbury M.D.   On: 02/20/2015 20:55    Microbiology: Recent Results (from the past 240 hour(s))  Urine culture     Status: None (Preliminary result)   Collection Time: 02/20/15  8:26 PM  Result Value Ref Range Status   Specimen Description URINE, RANDOM  Final   Special Requests NONE  Final   Culture   Final    40,000 COLONIES/ml GRAM NEGATIVE RODS 50,000 COLONIES/mL GRAM POSITIVE COCCI Performed at [facility]    Report Status PENDING  Incomplete     Labs: Basic Metabolic Panel:  Recent Labs Lab 02/20/15 1328 02/21/15 0156 02/22/15 0413  NA 134* 135 140  K 4.5 3.7 3.7  CL 100* 107 107  CO2 23 21* 23  GLUCOSE 144* 119* 121*  BUN 17 16 8   CREATININE 1.10* 0.97 0.86  CALCIUM 9.0 8.1* 8.3*   Liver Function Tests:  Recent Labs Lab 02/20/15 1328 02/21/15 0156  AST 17 20  ALT 17 17  ALKPHOS 55 48  BILITOT 0.9 0.9  PROT 7.4 6.3*  ALBUMIN 3.4* 2.9*    Recent Labs Lab 02/20/15 1328  LIPASE 18   No results for input(s): AMMONIA in the last 168 hours. CBC:  Recent Labs Lab 02/20/15 1328 02/20/15 1833 02/21/15 0001 02/21/15 0156 02/22/15 0413 02/23/15 0405  WBC 12.7* 11.3*  --  9.9 6.5 7.6  NEUTROABS  --  7.1  --  6.0 3.3 3.8  HGB 13.3 12.7 11.9* 11.7* 10.6* 11.5*  HCT 40.6 38.9 35.6* 36.3 32.6* 35.1*  MCV 95.8 94.6  --  96.5 97.0 95.6  PLT 231 226  --  222 228 236   Cardiac Enzymes: No results for input(s): CKTOTAL, CKMB, CKMBINDEX, TROPONINI in the last 168 hours. BNP: BNP (last 3 results) No results for input(s): BNP in the last 8760 hours.  ProBNP (last 3 results) No results for input(s): PROBNP in the last 8760  hours.  CBG:  Recent Labs Lab 02/21/15 2102 02/22/15 0814 02/22/15 1201 02/22/15 1744 02/22/15 2218  GLUCAP 194* 120* 223* 113* 112*     Signed:  Charlynne Cousins MD.  [facility] 02/23/2015, 8:27 AM

## 2015-02-23 NOTE — Care Management Important Message (Signed)
Important Message  Patient Details  Name: Shelia Black MRN: [id number] Date of Birth: 02-13-1921   Medicare Important Message Given:  Yes    Haskell Flirt 02/23/2015, 10:32 AMImportant Message  Patient Details  Name: Shelia Black MRN: [id number] Date of Birth: [birth year]   Medicare Important Message Given:  Yes    Haskell Flirt 02/23/2015, 10:32 AM

## 2015-02-24 LAB — URINE CULTURE: Culture: 40000

## 2015-03-24 ENCOUNTER — Emergency Department (HOSPITAL_COMMUNITY): Payer: Medicare Other

## 2015-03-24 ENCOUNTER — Encounter (HOSPITAL_COMMUNITY): Payer: Self-pay

## 2015-03-24 ENCOUNTER — Emergency Department (HOSPITAL_COMMUNITY)
Admission: EM | Admit: 2015-03-24 | Discharge: 2015-03-24 | Disposition: A | Discharge status: Home / Self Care | Payer: Medicare Other | Attending: Emergency Medicine | Admitting: Emergency Medicine

## 2015-03-24 DIAGNOSIS — H269 Unspecified cataract: Secondary | ICD-10-CM | POA: Insufficient documentation

## 2015-03-24 DIAGNOSIS — M461 Sacroiliitis, not elsewhere classified: Secondary | ICD-10-CM | POA: Insufficient documentation

## 2015-03-24 DIAGNOSIS — E039 Hypothyroidism, unspecified: Secondary | ICD-10-CM | POA: Diagnosis not present

## 2015-03-24 DIAGNOSIS — Z79899 Other long term (current) drug therapy: Secondary | ICD-10-CM | POA: Diagnosis not present

## 2015-03-24 DIAGNOSIS — E119 Type 2 diabetes mellitus without complications: Secondary | ICD-10-CM | POA: Insufficient documentation

## 2015-03-24 DIAGNOSIS — Z792 Long term (current) use of antibiotics: Secondary | ICD-10-CM | POA: Diagnosis not present

## 2015-03-24 DIAGNOSIS — M1990 Unspecified osteoarthritis, unspecified site: Secondary | ICD-10-CM | POA: Insufficient documentation

## 2015-03-24 DIAGNOSIS — M545 Low back pain: Secondary | ICD-10-CM | POA: Diagnosis present

## 2015-03-24 DIAGNOSIS — K219 Gastro-esophageal reflux disease without esophagitis: Secondary | ICD-10-CM | POA: Diagnosis not present

## 2015-03-24 DIAGNOSIS — Z880 Allergy status to penicillin: Secondary | ICD-10-CM | POA: Diagnosis not present

## 2015-03-24 MED ORDER — KETOROLAC TROMETHAMINE 60 MG/2ML IM SOLN
30.0000 mg | Freq: Once | INTRAMUSCULAR | Status: AC
  Administered 2015-03-24: 30 mg via INTRAMUSCULAR
  Filled 2015-03-24: qty 2

## 2015-03-24 MED ORDER — TRAMADOL HCL 50 MG PO TABS
100.0000 mg | ORAL_TABLET | Freq: Once | ORAL | Status: AC
  Administered 2015-03-24: 100 mg via ORAL
  Filled 2015-03-24: qty 2

## 2015-03-24 MED ORDER — TRAMADOL-ACETAMINOPHEN 37.5-325 MG PO TABS: 2.0000 | ORAL_TABLET | Freq: Four times a day (QID) | ORAL | Status: DC | PRN

## 2015-03-24 MED ORDER — NAPROXEN 500 MG PO TABS: 500.0000 mg | ORAL_TABLET | Freq: Two times a day (BID) | ORAL | Status: DC

## 2015-03-24 MED ORDER — ORPHENADRINE CITRATE ER 100 MG PO TB12: 100.0000 mg | ORAL_TABLET | Freq: Two times a day (BID) | ORAL | Status: DC

## 2015-03-24 NOTE — ED Provider Notes (Signed)
CSN: [id number]     Arrival date & time 03/24/15  9379 History   First MD Initiated Contact with Patient 03/24/15 [phone]     Chief Complaint  Patient presents with  . Back Pain     (Consider location/radiation/quality/duration/timing/severity/associated sxs/prior Treatment) HPI Patient has prior history of back pain and problems. She has a history of spinal stenosis and degenerative joint disease. Patient lives part time in [place] with her daughter. She has had prior lower back injections which was successfully treated pain for her. This pain started approximately 3 weeks ago. Has been getting progressively worse. He concentrates in her lower right back. She reports is worse with twisting or bending movements. Sometimes it radiates into her buttock and the back of her leg. There is no weakness and numbness to the legs. No bowel or bladder dysfunction. Walking is limited by pain in her back but not by weakness. Patient had been treated for diverticulitis approximately a month ago. She reports the symptoms have completely resolved. She is not having any abdominal pain. No nausea vomiting or diarrhea. Her physician gave her some tizanidine to try for pain control. She reports it has done nothing to improve her pain. Past Medical History  Diagnosis Date  . Thyroid disease   . Diabetes mellitus without complication ([place])   . Cataract   . Chronic cough   . Diverticular disease   . Hypothyroidism   . GERD (gastroesophageal reflux disease)   . Arthritis    Past Surgical History  Procedure Laterality Date  . Appendectomy    . Replacement total knee bilateral    . Joint replacement     Family History  Problem Relation Age of Onset  . Hypertension Child    Social History  Substance Use Topics  . Smoking status: Never Smoker   . Smokeless tobacco: Never Used  . Alcohol Use: No   OB History    No data available     Review of Systems 10 Systems reviewed and are negative for acute  change except as noted in the HPI.    Allergies  Codeine; Percocet; and Penicillins  Home Medications   Prior to Admission medications   Medication Sig Start Date End Date Taking? Authorizing Provider  albuterol (PROVENTIL HFA;VENTOLIN HFA) 108 (90 Base) MCG/ACT inhaler Inhale 1 puff into the lungs every 6 (six) hours as needed for wheezing or shortness of breath.    Historical Provider, MD  ciprofloxacin (CIPRO) 500 MG tablet Take 1 tablet (500 mg total) by mouth 2 (two) times daily. 02/23/15   Charlynne Cousins, MD  diphenoxylate-atropine (LOMOTIL) 2.5-0.025 MG tablet Take 1 tablet by mouth 4 (four) times daily as needed for diarrhea or loose stools. diarrhea 11/11/12   Historical Provider, MD  Hydrocodone-Chlorpheniramine 5-4 MG/5ML SOLN Take 5 mLs by mouth 2 (two) times daily as needed (cough).     Historical Provider, MD  ibuprofen (ADVIL,MOTRIN) 200 MG tablet Take 800 mg by mouth every 4 (four) hours as needed for moderate pain.    Historical Provider, MD  levothyroxine (SYNTHROID, LEVOTHROID) 125 MCG tablet Take 125 mcg by mouth daily before breakfast.    Historical Provider, MD  metroNIDAZOLE (FLAGYL) 500 MG tablet Take 1 tablet (500 mg total) by mouth every 8 (eight) hours. 02/23/15   Charlynne Cousins, MD  naproxen (NAPROSYN) 500 MG tablet Take 1 tablet (500 mg total) by mouth 2 (two) times daily. 03/24/15   Charlesetta Shanks, MD  omeprazole (PRILOSEC) 20 MG capsule Take  20 mg by mouth daily.    Historical Provider, MD  orphenadrine (NORFLEX) 100 MG tablet Take 1 tablet (100 mg total) by mouth 2 (two) times daily. 03/24/15   Charlesetta Shanks, MD  Probiotic Product (ALIGN PO) Take 1 tablet by mouth daily.    Historical Provider, MD  traMADol-acetaminophen (ULTRACET) 37.5-325 MG tablet Take 2 tablets by mouth every 6 (six) hours as needed. 03/24/15   Charlesetta Shanks, MD  zolpidem (AMBIEN) 5 MG tablet Take 2.5 mg by mouth at bedtime as needed for sleep.    Historical Provider, MD   BP 99/87  mmHg  Pulse 62  Temp(Src) 97.6 F (36.4 C) (Oral)  Resp 18  SpO2 95%  LMP  (LMP Unknown) Physical Exam  Constitutional: She is oriented to person, place, and time.  Patient is alert and well-nourished and well-developed. Well appearance for age. Patient does appear to have pain with movements.  HENT:  Head: Normocephalic and atraumatic.  Eyes: EOM are normal.  Cardiovascular: Normal rate, regular rhythm, normal heart sounds and intact distal pulses.   Pulmonary/Chest: Effort normal and breath sounds normal. No respiratory distress. She exhibits no tenderness.  Abdominal: Soft. Bowel sounds are normal. She exhibits no distension. There is no tenderness.  Musculoskeletal:  Pain is exacerbated by forward flexion. Pain localizes to the SI joint on the right. There is also some reproducible pain to palpation over this region. No lower extremity edema or calf tenderness.  Neurological: She is alert and oriented to person, place, and time. No cranial nerve deficit. She exhibits normal muscle tone. Coordination normal.  Strength testing patient has 5 out of 5 flexion and extension with plantar and dorsiflexion. 5 out of 5 extension and flexion at the knee. Sensation intact to light touch bilateral lower extremity is.  Skin: Skin is warm and dry.  Psychiatric: She has a normal mood and affect.    ED Course  Procedures (including critical care time) Labs Review Labs Reviewed - No data to display  Imaging Review Dg Lumbar Spine 2-3 Views  03/24/2015  CLINICAL DATA:  Worsening mid and low back pain for 3 weeks. No known injury. EXAM: LUMBAR SPINE - 2-3 VIEW COMPARISON:  CT 02/20/2015 FINDINGS: Mild levoscoliosis of the lower lumbar spine at L4-L5. The vertebral body heights are maintained. Again noted is disc space narrowing at L4-L5 and L5-S1. Atherosclerotic calcifications in the abdominal aorta. IMPRESSION: Mild scoliosis with degenerative changes in the lumbar spine. No acute abnormality.  Electronically Signed   By: Markus Daft M.D.   On: 03/24/2015 08:46   I have personally reviewed and evaluated these images and lab results as part of my medical decision-making.   EKG Interpretation None      MDM   Final diagnoses:  Sacroiliitis ([place])   Patient does have significant degenerative disease of the lower back. This was seen on CT scan done last month and also x-rays today. No acute findings of acute fracture identified. Patient does not have neurologic dysfunction. Pain is localized over the SI joint with some radiation consistent with radiculopathy. At this time there is no neurologic dysfunction. Symptoms from her diverticulitis appear to be resolved with no complaints of abdominal pain and nontender examination. The patient's only medication for pain control have been the tizanidine. He reports severe nausea and vomiting with Percocet. At this time, patient will be given naproxen, Norflex and Ultracet to try over the weekend for pain control. She is counseled to follow up with her family physician  and [facility]. She was hoping to get low back injections done today however advised for initiating oral medications and the scheduling her follow-up with [facility].    Charlesetta Shanks, MD 03/24/15 1002

## 2015-03-24 NOTE — ED Notes (Signed)
Patient transported to X-ray 

## 2015-03-24 NOTE — ED Notes (Signed)
Went to update patient's vitals before discharge, patient visitor realized this was the first time vitals were done. I [facility]) asked triage tech who brought patient back if vitals were done when they brought them back and they did not obtain vitals when they brought patient back to room 13. However, the triage tech never made myself or the nurse in this section aware that vitals were not completed.

## 2015-03-24 NOTE — Discharge Instructions (Signed)
Sacroiliac Joint Dysfunction Sacroiliac joint dysfunction is a condition that causes inflammation on one or both sides of the sacroiliac (SI) joint. The SI joint connects the lower part of the spine (sacrum) with the two upper portions of the pelvis (ilium). This condition causes deep aching or burning pain in the low back. In some cases, the pain may also spread into one or both buttocks or hips or spread down the legs. CAUSES This condition may be caused by:  Pregnancy. During pregnancy, extra stress is put on the SI joints because the pelvis widens.  Injury, such as:  Car accidents.  Sport-related injuries.  Work-related injuries.  Having one leg that is shorter than the other.  Conditions that affect the joints, such as:  Rheumatoid arthritis.  Gout.  Psoriatic arthritis.  Joint infection (septic arthritis). Sometimes, the cause of SI joint dysfunction is not known. SYMPTOMS Symptoms of this condition include:  Aching or burning pain in the lower back. The pain may also spread to other areas, such as:  Buttocks.  Groin.  Thighs and legs.  Muscle spasms in or around the painful areas.  Increased pain when standing, walking, running, stair climbing, bending, or lifting. DIAGNOSIS Your health care provider will do a physical exam and take your medical history. During the exam, the health care provider may move one or both of your legs to different positions to check for pain. Various tests may be done to help verify the diagnosis, including:  Imaging tests to look for other causes of pain. These may include:  MRI.  CT scan.  Bone scan.  Diagnostic injection. A numbing medicine is injected into the SI joint using a needle. If the pain is temporarily improved or stopped after the injection, this can indicate that SI joint dysfunction is the problem. TREATMENT Treatment may vary depending on the cause and severity of your condition. Treatment options may  include:  Applying ice or heat to the lower back area. This can help to reduce pain and muscle spasms.  Medicines to relieve pain or inflammation or to relax the muscles.  Wearing a back brace (sacroiliac brace) to help support the joint while your back is healing.  Physical therapy to increase muscle strength around the joint and flexibility at the joint. This may also involve learning proper body positions and ways of moving to relieve stress on the joint.  Direct manipulation of the SI joint.  Injections of steroid medicine into the joint in order to reduce pain and swelling.  Radiofrequency ablation to burn away nerves that are carrying pain messages from the joint.  Use of a device that provides electrical stimulation in order to reduce pain at the joint.  Surgery to put in screws and plates that limit or prevent joint motion. This is rare. HOME CARE INSTRUCTIONS  Rest as needed. Limit your activities as directed by your health care provider.  Take medicines only as directed by your health care provider.  If directed, apply ice to the affected area:  Put ice in a plastic bag.  Place a towel between your skin and the bag.  Leave the ice on for 20 minutes, 2-3 times per day.  Use a heating pad or a moist heat pack as directed by your health care provider.  Exercise as directed by your health care provider or physical therapist.  Keep all follow-up visits as directed by your health care provider. This is important. SEEK MEDICAL CARE IF:  Your pain is not controlled   with medicine.  You have a fever.  You have increasingly severe pain. SEEK IMMEDIATE MEDICAL CARE IF:  You have weakness, numbness, or tingling in your legs or feet.  You lose control of your bladder or bowel.   This information is not intended to replace advice given to you by your health care provider. Make sure you discuss any questions you have with your health care provider.   Document Released:  04/19/2008 Document Revised: 06/07/2014 Document Reviewed: 09/28/2013 Elsevier Interactive Patient Education 2016 [employer]  

## 2015-03-24 NOTE — ED Notes (Signed)
Patient c/o lower back pain x 3 weeks that has gotten progressively worse. Patient denies any injury

## 2015-05-26 IMAGING — DX DG CHEST 1V PORT
1 series · 1 of 1 positions shown · non-contrast
Comparison: CT scan abdomen dated 04/29/2014

CLINICAL DATA: Hypoxia.

EXAM:
PORTABLE CHEST - 1 VIEW

[chest ap]
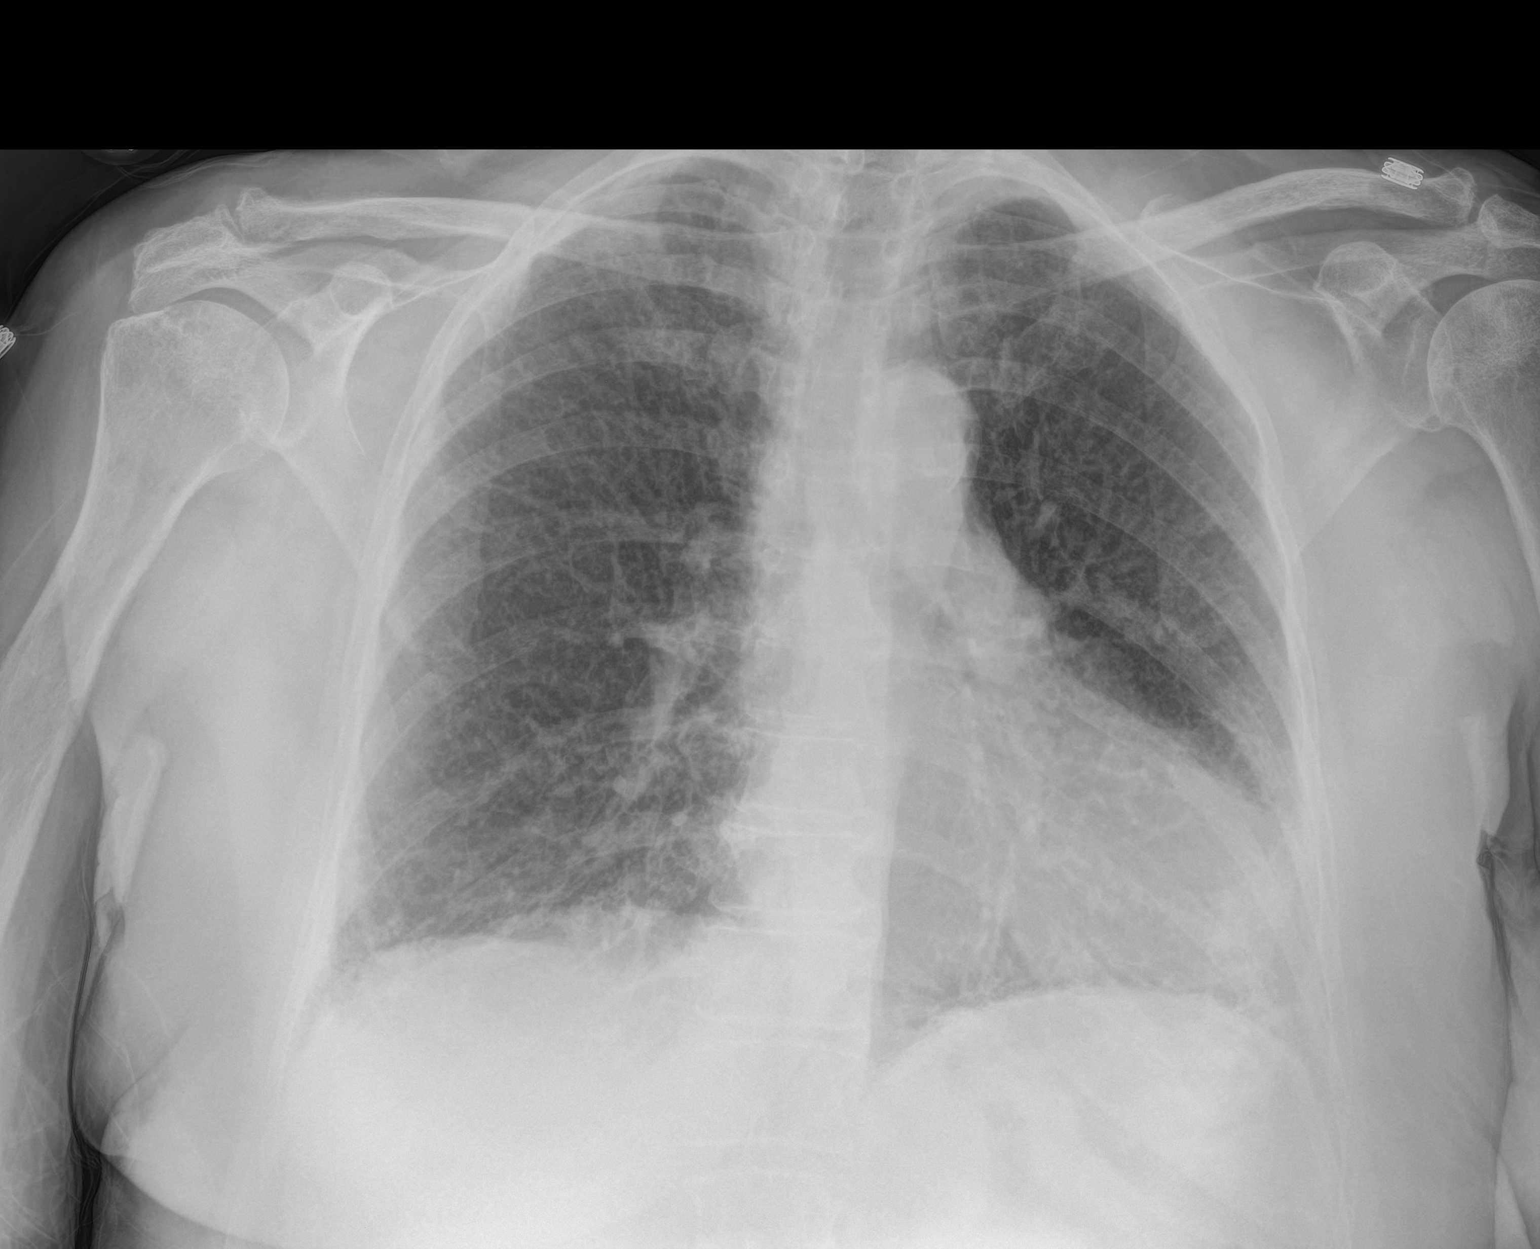

[1 of 1 positions shown; findings below may reference images not displayed]

FINDINGS: Heart size and pulmonary vascularity are normal. There is diffuse
accentuation of the interstitial markings consistent with chronic
interstitial lung disease. The appearance on CT scan suggests
interstitial fibrosis.

No infiltrates or effusions.  No acute osseous abnormality.
IMPRESSION: Chronic interstitial lung disease, probably interstitial fibrosis.

## 2015-05-26 IMAGING — CT CT ABD-PELV W/O CM
1 of 2 series · 15 of 32 positions shown, 19 images · non-contrast
Comparison: None.

CLINICAL DATA: Abdominal pain for 2 weeks

EXAM:
CT ABDOMEN AND PELVIS WITHOUT CONTRAST
TECHNIQUE: Multidetector CT imaging of the abdomen and pelvis was performed
following the standard protocol without IV contrast.

[Series 2: abd/pel w/o · axial · non-contrast · 0.79mm/px · z∈[+1032,+1472]mm · 15 of 96 slices shown, 19 images]
[im 4/96  soft-tissue]
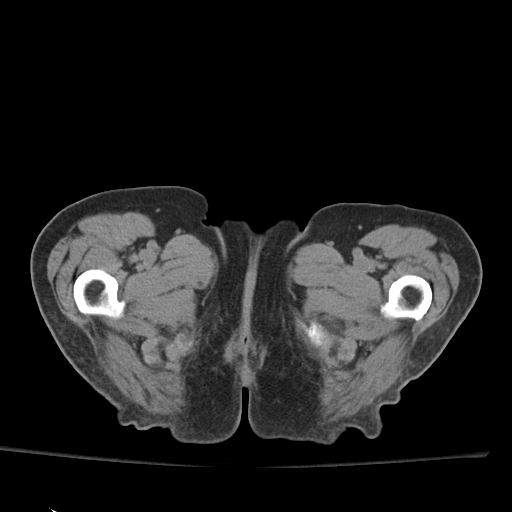
[im 4/96  bone]
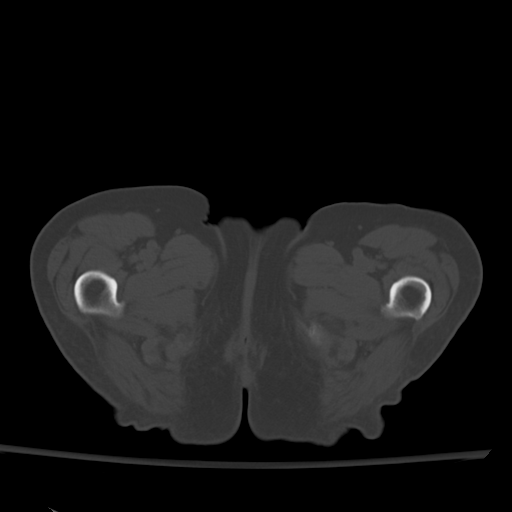
[im 12/96  soft-tissue]
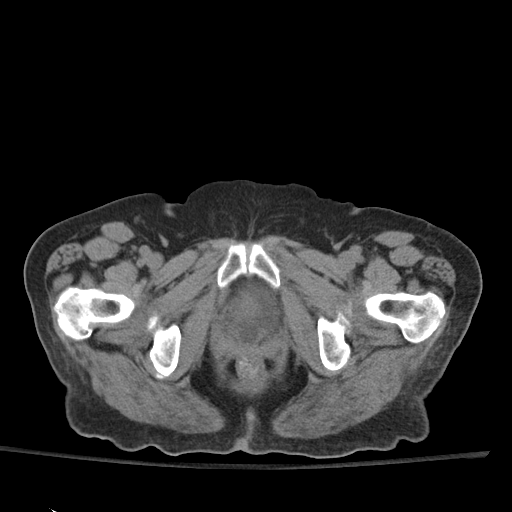
[im 20/96  soft-tissue]
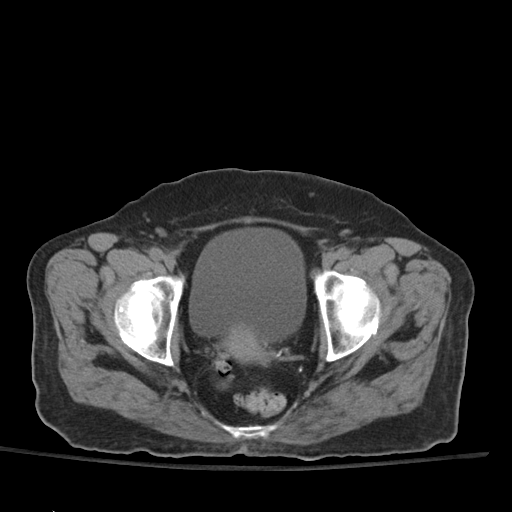
[im 27/96  soft-tissue]
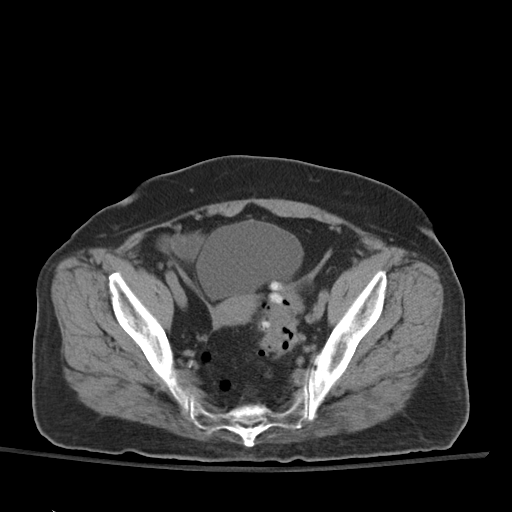
[im 35/96  soft-tissue]
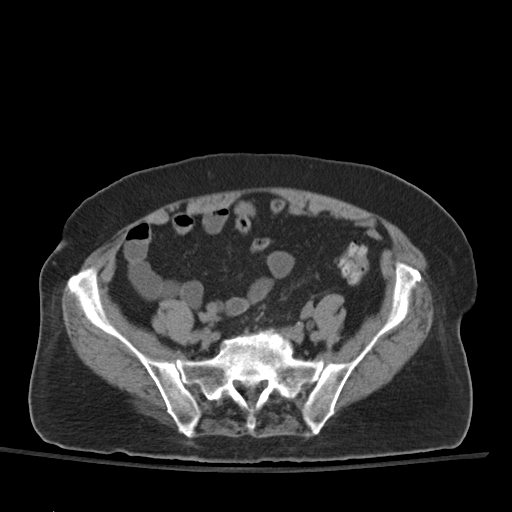
[im 42/96  soft-tissue]
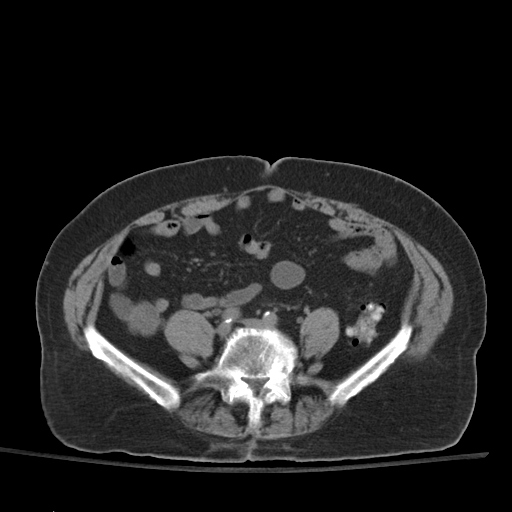
[im 50/96  soft-tissue]
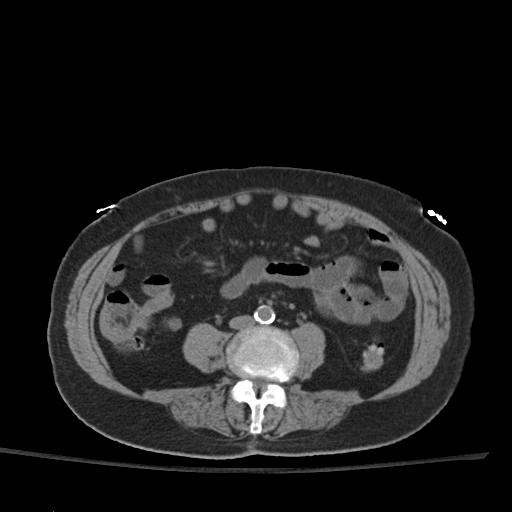
[im 54/96  soft-tissue]
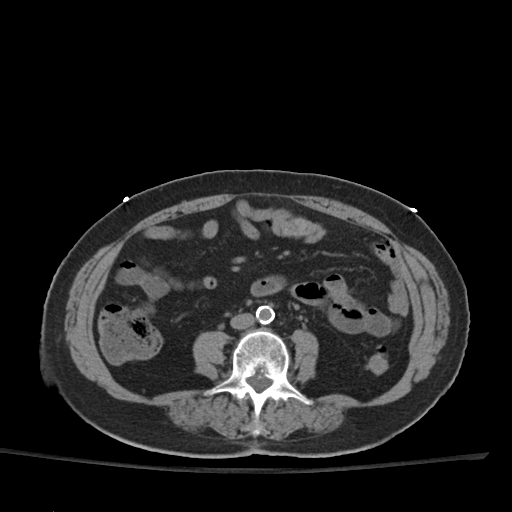
[im 61/96  soft-tissue]
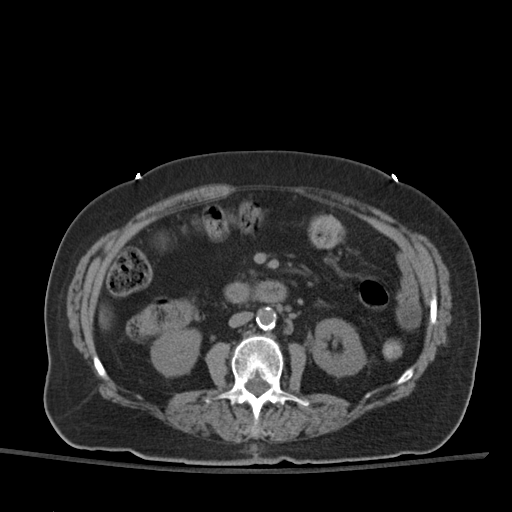
[im 61/96  bone]
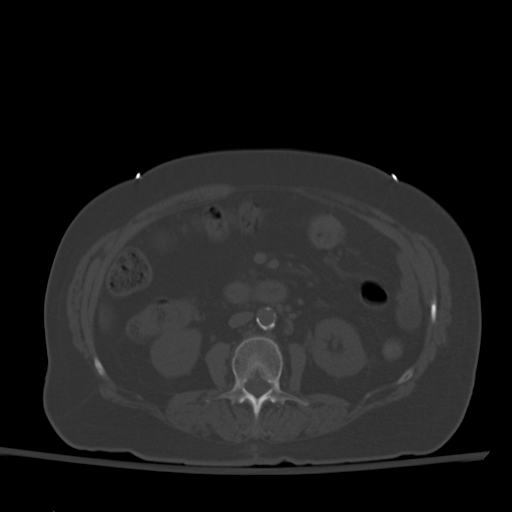
[im 69/96  soft-tissue]
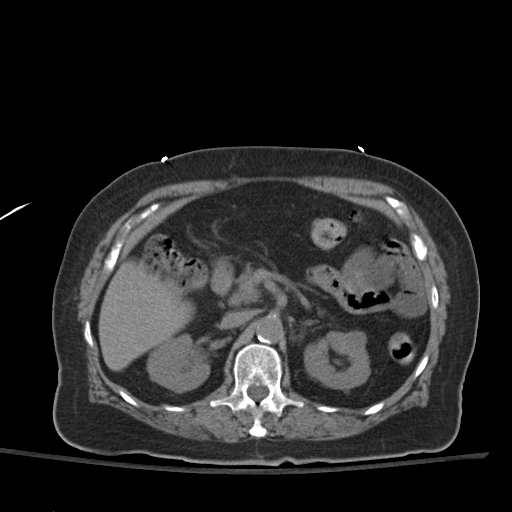
[im 77/96  soft-tissue]
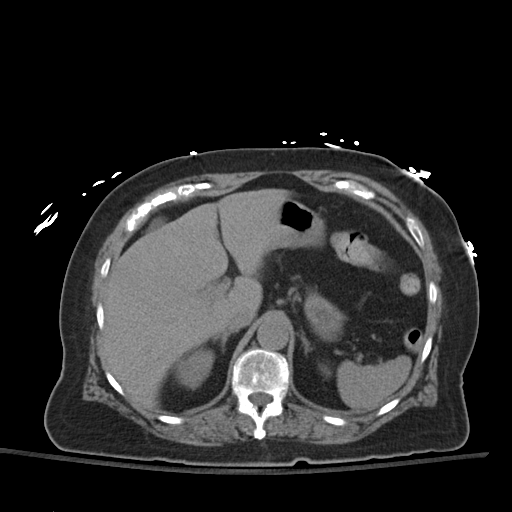
[im 80/96  lung]
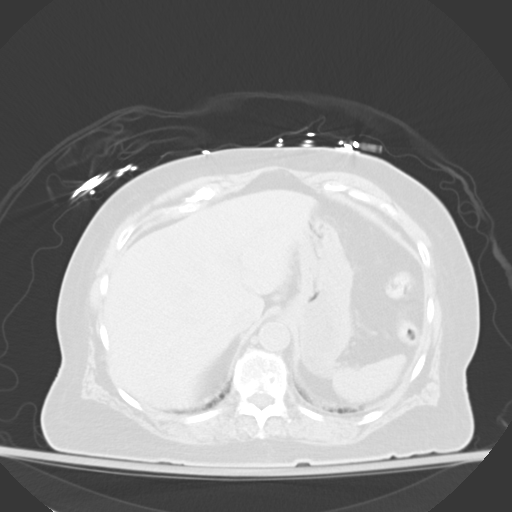
[im 84/96  soft-tissue]
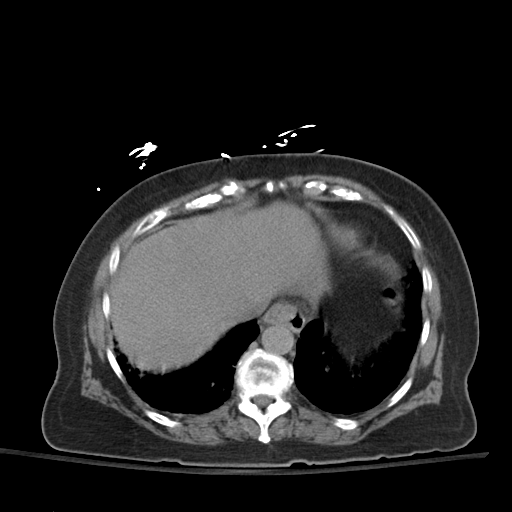
[im 84/96  lung]
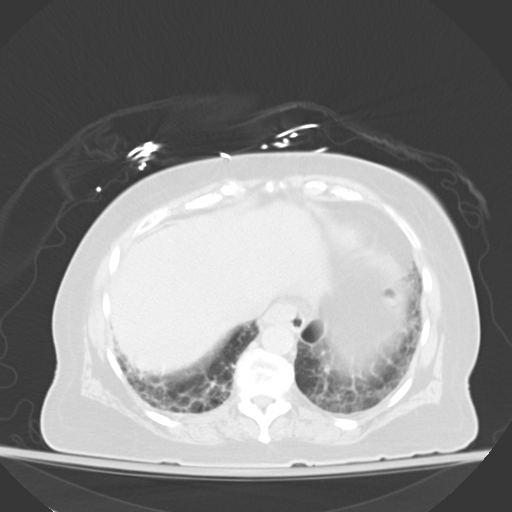
[im 88/96  lung]
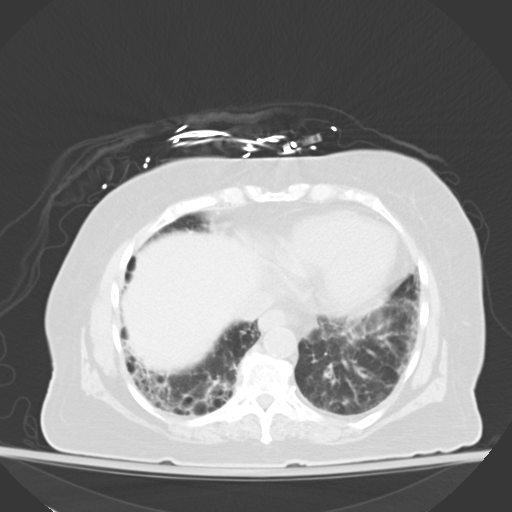
[im 92/96  soft-tissue]
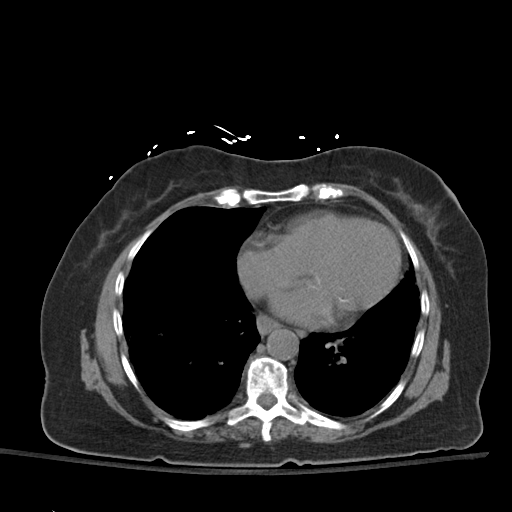
[im 92/96  lung]
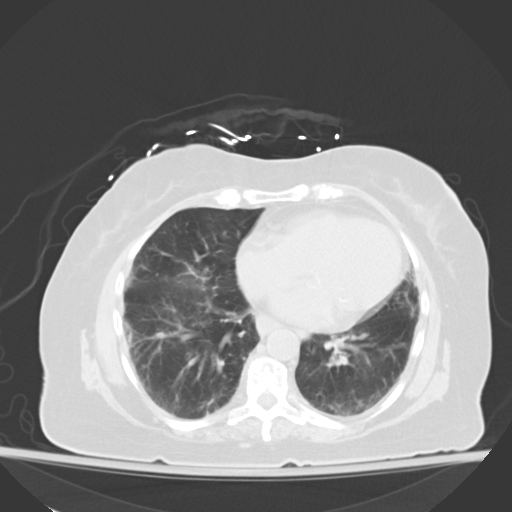

[15 of 32 positions shown; findings below may reference images not displayed]

FINDINGS: Lung bases demonstrate some fibrotic changes in the lower lobes
bilaterally. No focal confluent infiltrate or sizable effusion is
seen. Tiny 3 mm nodule is noted in the right middle lobe best seen
on the first image of series 4.

The liver, gallbladder, spleen, adrenal glands and pancreas are
within normal limits. A sliding-type hiatal hernia is noted. Kidneys
are well visualized and demonstrate no renal calculi or obstructive
change. The bladder is well distended. A few small hypodensities are
noted within the kidneys but incompletely characterized. These may
simply represent cysts.

The appendix has been surgically removed. Colonic diverticulosis is
noted. There are changes consistent with mild diverticulitis in the
mid sigmoid. No perforation or abscess is seen. No free pelvic fluid
is noted. Degenerative changes of the lumbar spine
IMPRESSION: Diverticulitis in the mid sigmoid without complicating factors.

Chronic changes as described above.

## 2016-03-18 IMAGING — CT CT ABD-PELV W/ CM
2 of 5 series · 16 of 46 positions shown, 18 images · IV contrast (omnipaque)
Comparison: 04/29/2014

CLINICAL DATA: Worsening lower abdominal pain and leukocytosis.
Clinical concern for diverticulitis.

EXAM:
CT ABDOMEN AND PELVIS WITH CONTRAST
TECHNIQUE: Multidetector CT imaging of the abdomen and pelvis was performed
using the standard protocol following bolus administration of
intravenous contrast.
CONTRAST:  80mL OMNIPAQUE IOHEXOL 300 MG/ML  SOLN

[Series 2: abd/pel with · axial · 0.77mm/px · z∈[+1342,+1752]mm · 13 of 92 slices shown, 15 images]
[im 5/92  soft-tissue]
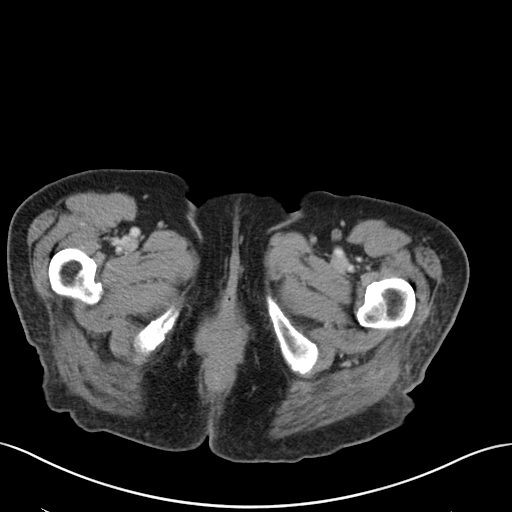
[im 5/92  bone]
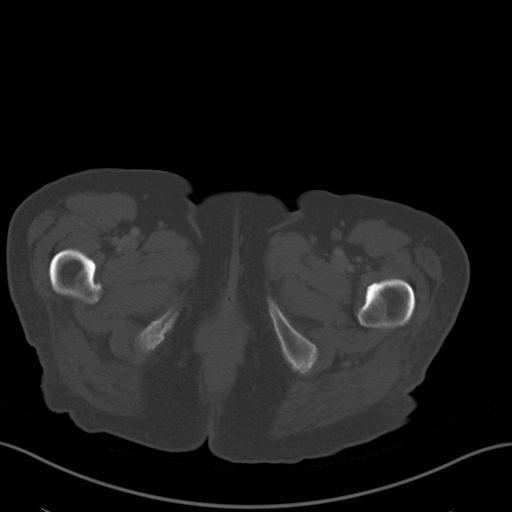
[im 14/92  soft-tissue]
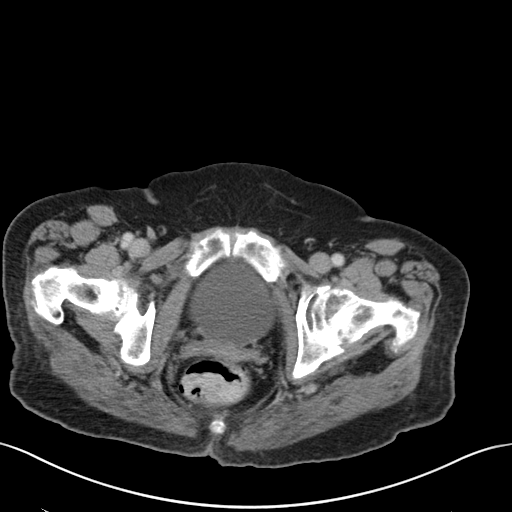
[im 19/92  soft-tissue]
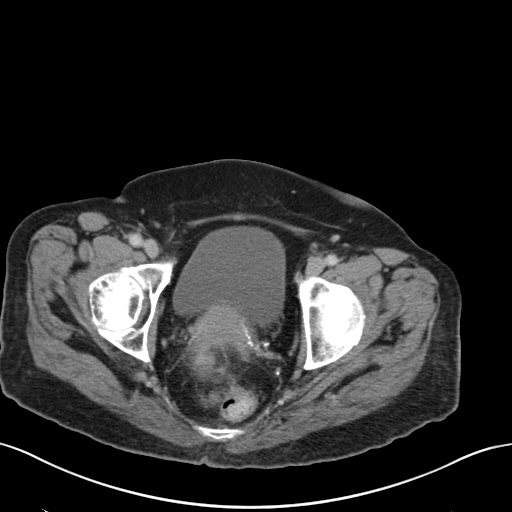
[im 28/92  soft-tissue]
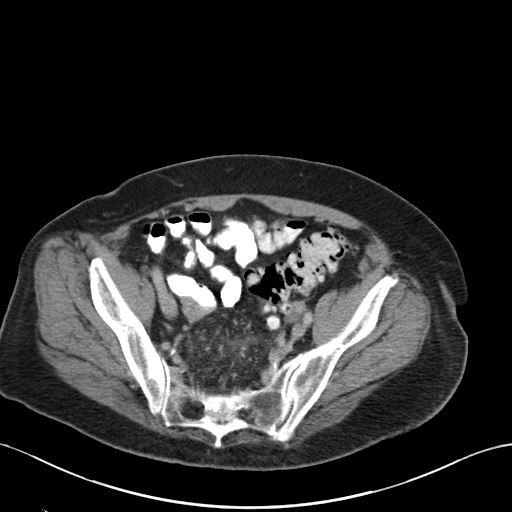
[im 32/92  soft-tissue]
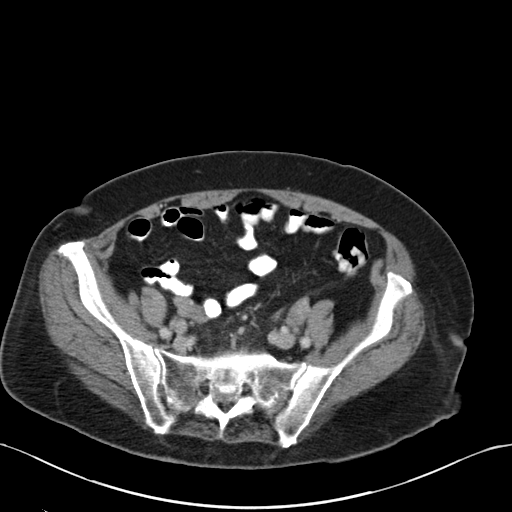
[im 41/92  soft-tissue]
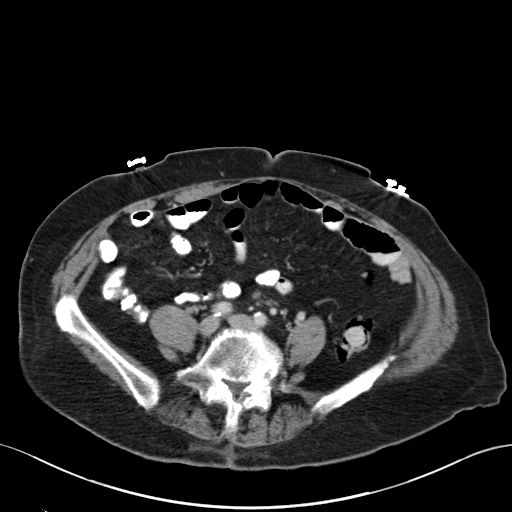
[im 46/92  soft-tissue]
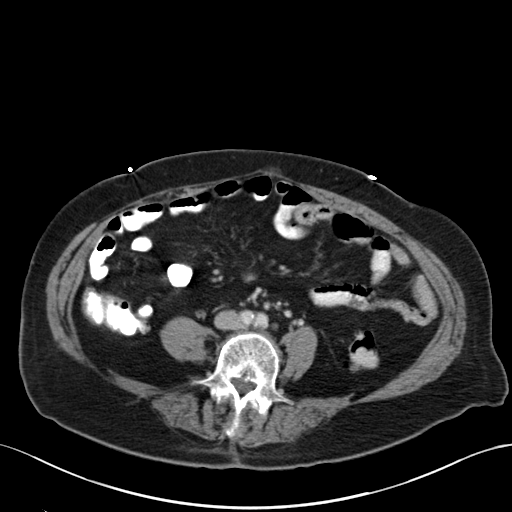
[im 51/92  soft-tissue]
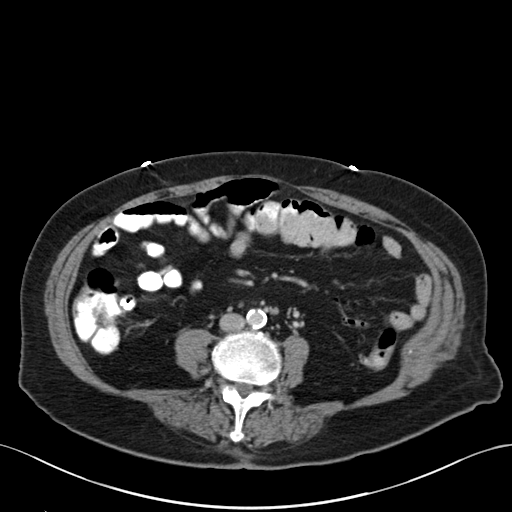
[im 60/92  soft-tissue]
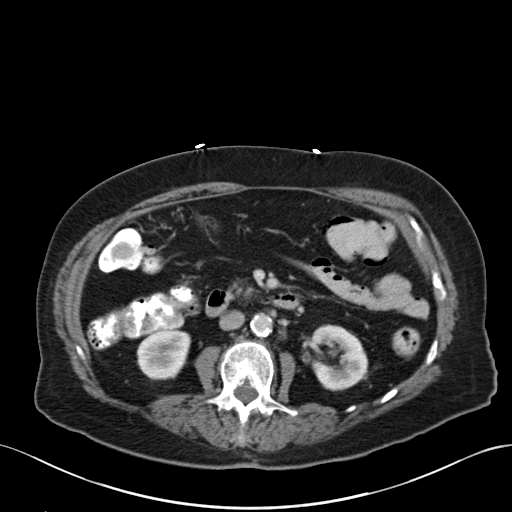
[im 60/92  bone]
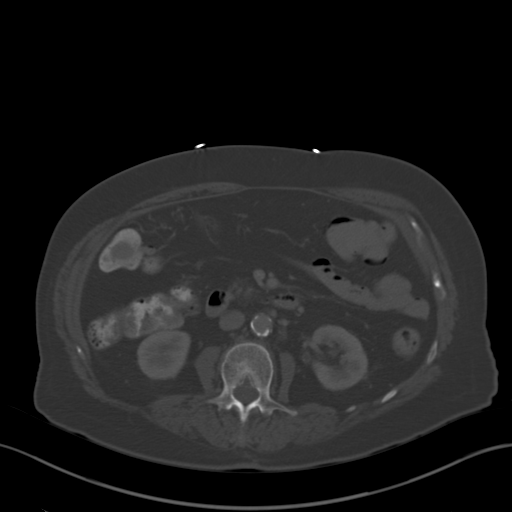
[im 64/92  soft-tissue]
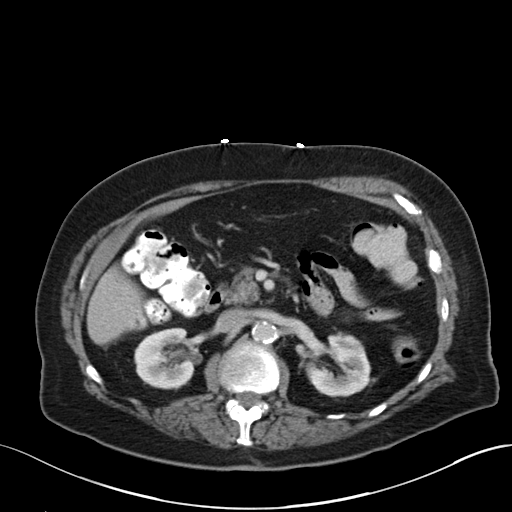
[im 73/92  soft-tissue]
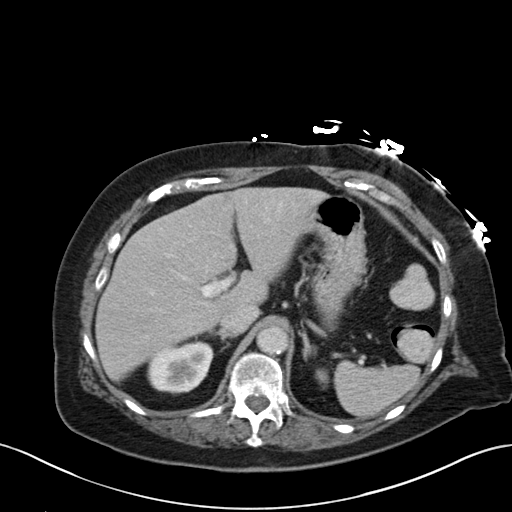
[im 78/92  soft-tissue]
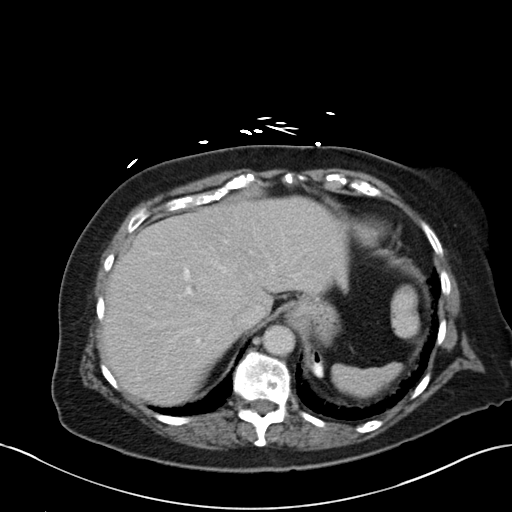
[im 87/92  soft-tissue]
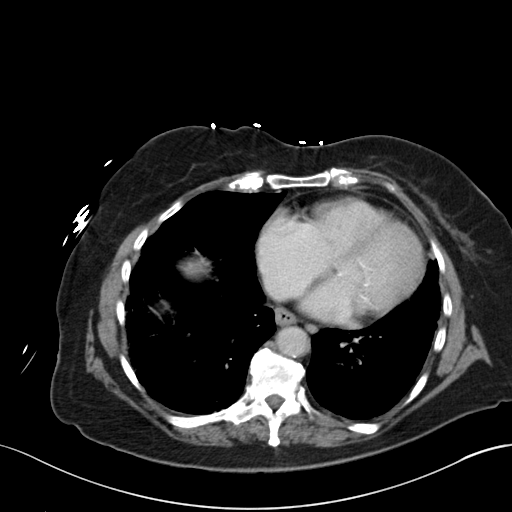

[Series 4: coronal a/|p · coronal · 0.74mm/px · 3 of 94 slices shown]
[im 32/94  soft-tissue]
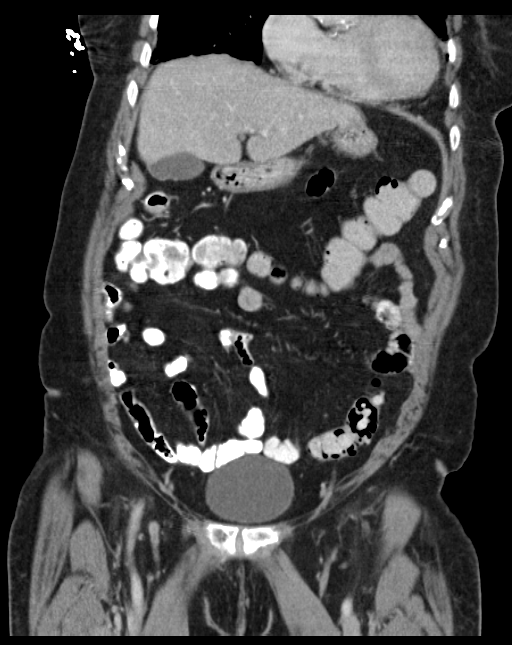
[im 42/94  soft-tissue]
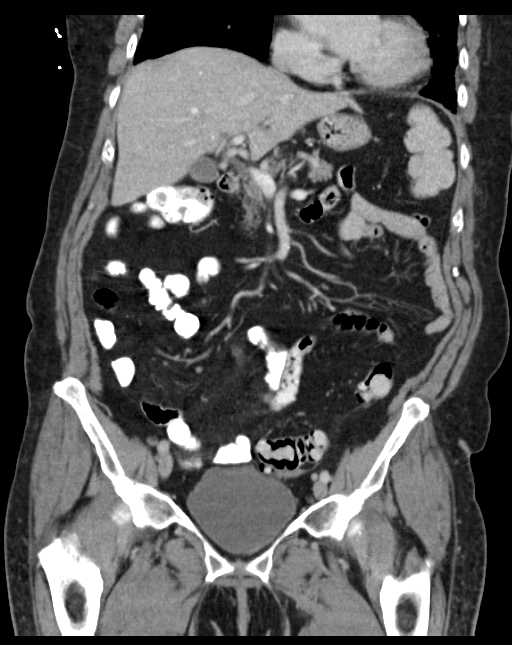
[im 52/94  soft-tissue]
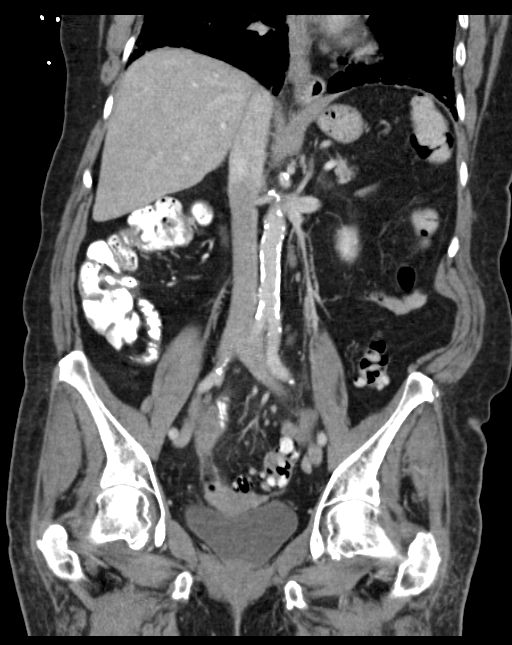

[16 of 46 positions shown; findings below may reference images not displayed]

FINDINGS: Lower chest: No acute findings. Chronic interstitial lung changes
with paraseptal bullous formation are seen. The heart size is mildly
enlarged. There is a small hiatal hernia.

Hepatobiliary: No masses or other significant abnormality. The
gallbladder is normal.

Pancreas: No mass, inflammatory changes, or other significant
abnormality. The pancreas is atrophic.

Spleen: Within normal limits in size and appearance.

Adrenals/Urinary Tract: No masses identified. No evidence of
hydronephrosis. 21 mm partially exophytic left renal cyst is noted.

Stomach/Bowel: No evidence of obstruction, or abnormal fluid
collections. There are multiple sigmoid diverticula with evidence of
pericolonic inflammatory changes in the distal sigmoid colon
centered around enlarged diverticulum, suggestive of associated
diverticulitis, image 73, series 2. There is no evidence of free gas
to suggest perforation or enhancing fluid collections to abscess
formation.

Vascular/Lymphatic: No pathologically enlarged lymph nodes. No
evidence of abdominal aortic aneurysm. Atherosclerotic
calcifications of the aorta and its main branches are seen.

Reproductive: No mass or other significant abnormality.

Other: None.

Musculoskeletal: No suspicious bone lesions identified. Multilevel
osteoarthritic changes worse at L5-S1 noted.
IMPRESSION: Extensive sigmoid diverticulosis, with evidence of diverticulitis in
the distal sigmoid colon, without perforation or abscess formation.

Small hiatal hernia.

Chronic interstitial lung changes in the lung bases.

Benign-appearing left renal cyst.

## 2016-03-19 IMAGING — CR DG CHEST 2V
1 series · 1 of 1 positions shown · non-contrast
Comparison: 04/29/2014.

CLINICAL DATA: Chronic cough.

EXAM:
CHEST  2 VIEW

[w chest lat]
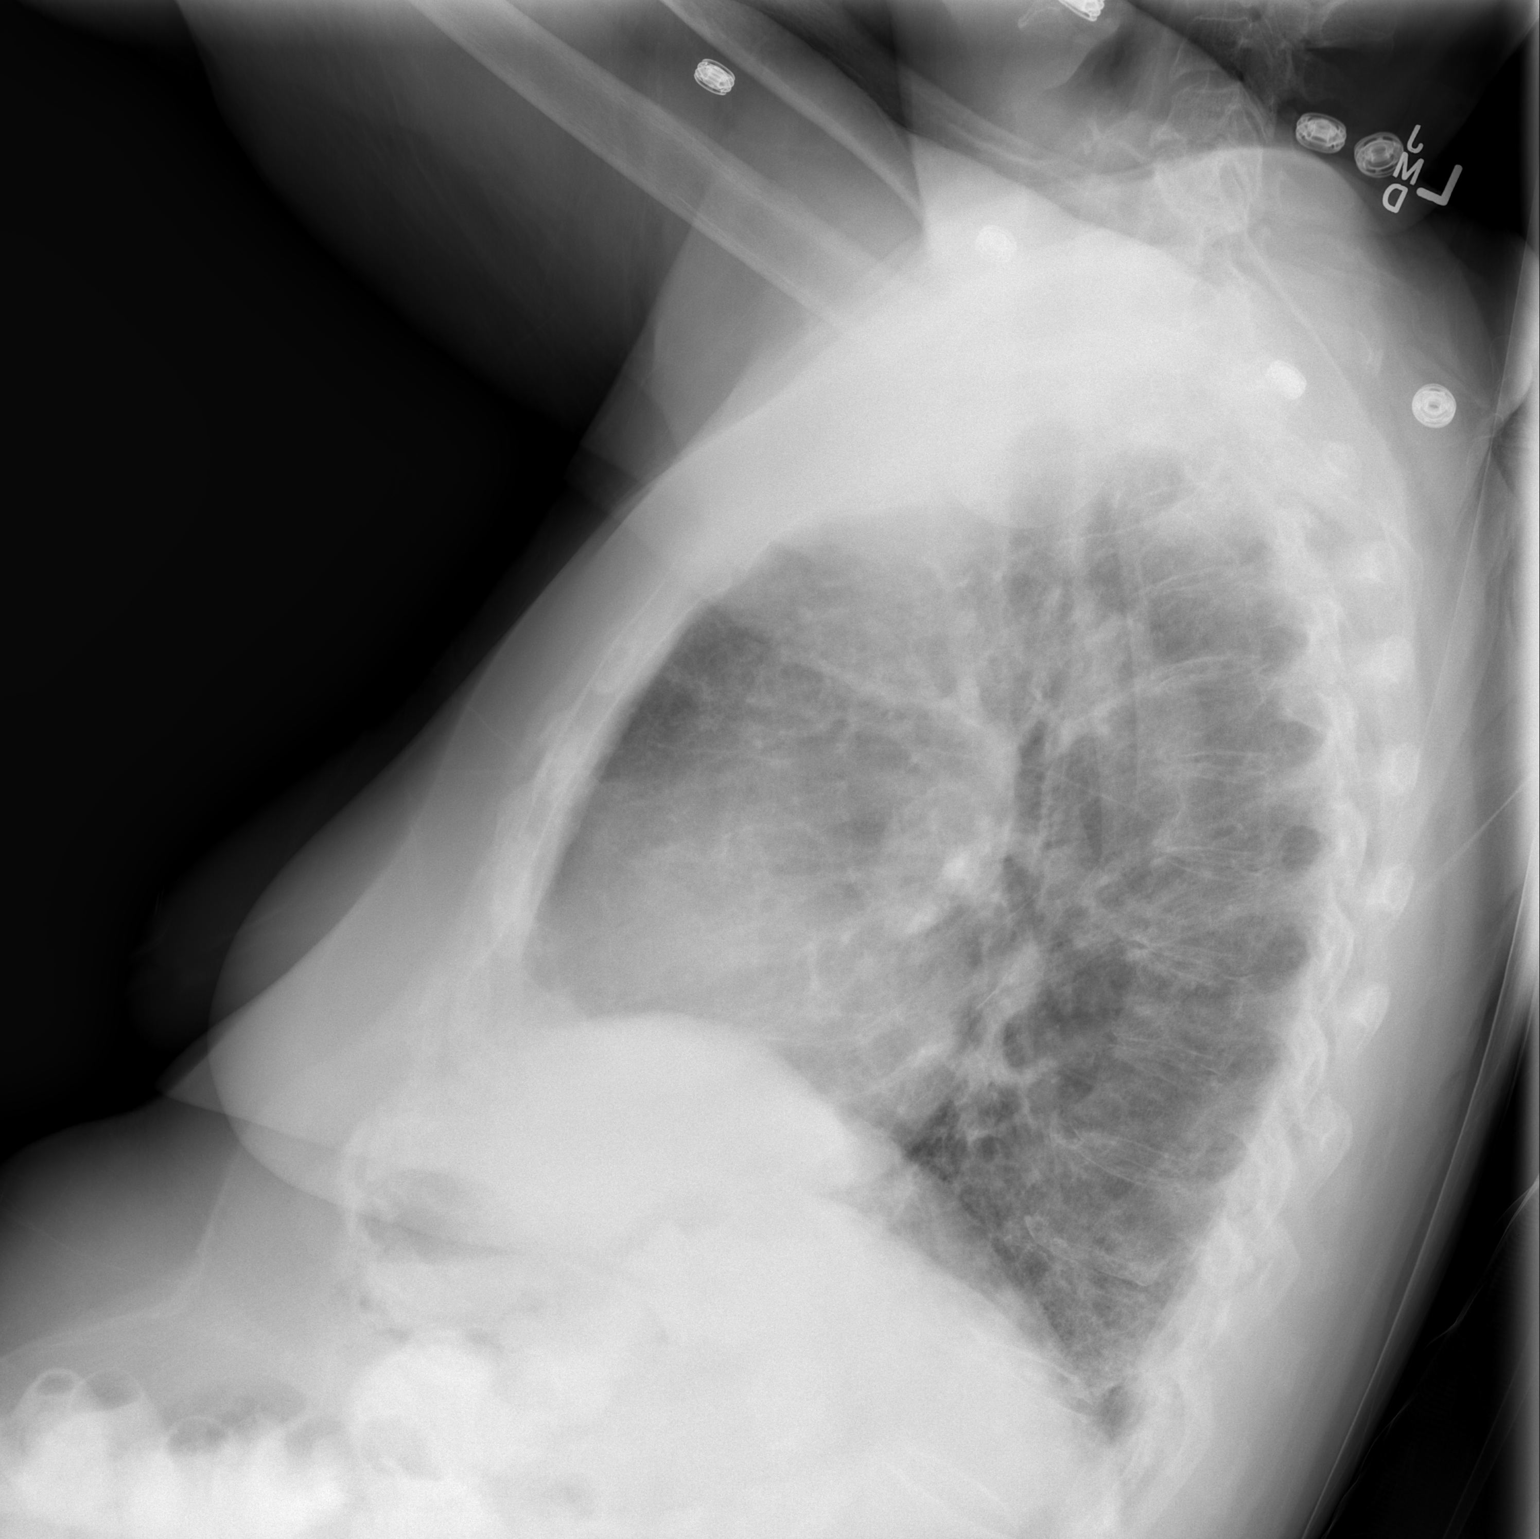

[1 of 1 positions shown; findings below may reference images not displayed]

FINDINGS: The cardiac silhouette remains mildly enlarged. Mildly prominent
pulmonary vasculature and interstitial markings. Stable scarring at
the lung bases. Mild central peribronchial thickening. Mild thoracic
spine degenerative changes.
IMPRESSION: 1. Interval mild chronic bronchitic changes.
2. Interval mild pulmonary vascular congestion.
3. Stable interstitial fibrosis and bibasilar scarring.

## 2016-05-22 ENCOUNTER — Encounter (HOSPITAL_COMMUNITY): Payer: Self-pay

## 2016-05-22 ENCOUNTER — Inpatient Hospital Stay (HOSPITAL_COMMUNITY)
Admission: EM | Admit: 2016-05-22 | Discharge: 2016-05-27 | DRG: 871 | Disposition: A | Discharge status: Home Health | Payer: Medicare Other | Attending: Family Medicine | Admitting: Family Medicine

## 2016-05-22 ENCOUNTER — Emergency Department (HOSPITAL_COMMUNITY): Payer: Medicare Other

## 2016-05-22 DIAGNOSIS — E669 Obesity, unspecified: Secondary | ICD-10-CM | POA: Diagnosis present

## 2016-05-22 DIAGNOSIS — K5732 Diverticulitis of large intestine without perforation or abscess without bleeding: Secondary | ICD-10-CM

## 2016-05-22 DIAGNOSIS — B965 Pseudomonas (aeruginosa) (mallei) (pseudomallei) as the cause of diseases classified elsewhere: Secondary | ICD-10-CM | POA: Diagnosis present

## 2016-05-22 DIAGNOSIS — E119 Type 2 diabetes mellitus without complications: Secondary | ICD-10-CM | POA: Diagnosis present

## 2016-05-22 DIAGNOSIS — R519 Headache, unspecified: Secondary | ICD-10-CM

## 2016-05-22 DIAGNOSIS — E039 Hypothyroidism, unspecified: Secondary | ICD-10-CM | POA: Diagnosis present

## 2016-05-22 DIAGNOSIS — R0602 Shortness of breath: Secondary | ICD-10-CM

## 2016-05-22 DIAGNOSIS — F32A Depression, unspecified: Secondary | ICD-10-CM | POA: Diagnosis present

## 2016-05-22 DIAGNOSIS — G43909 Migraine, unspecified, not intractable, without status migrainosus: Secondary | ICD-10-CM | POA: Diagnosis present

## 2016-05-22 DIAGNOSIS — K5733 Diverticulitis of large intestine without perforation or abscess with bleeding: Secondary | ICD-10-CM | POA: Diagnosis present

## 2016-05-22 DIAGNOSIS — N390 Urinary tract infection, site not specified: Secondary | ICD-10-CM | POA: Diagnosis present

## 2016-05-22 DIAGNOSIS — F329 Major depressive disorder, single episode, unspecified: Secondary | ICD-10-CM | POA: Diagnosis present

## 2016-05-22 DIAGNOSIS — K219 Gastro-esophageal reflux disease without esophagitis: Secondary | ICD-10-CM | POA: Diagnosis present

## 2016-05-22 DIAGNOSIS — J449 Chronic obstructive pulmonary disease, unspecified: Secondary | ICD-10-CM | POA: Diagnosis present

## 2016-05-22 DIAGNOSIS — J849 Interstitial pulmonary disease, unspecified: Secondary | ICD-10-CM

## 2016-05-22 DIAGNOSIS — E1169 Type 2 diabetes mellitus with other specified complication: Secondary | ICD-10-CM | POA: Diagnosis present

## 2016-05-22 DIAGNOSIS — Z96653 Presence of artificial knee joint, bilateral: Secondary | ICD-10-CM | POA: Diagnosis present

## 2016-05-22 DIAGNOSIS — A419 Sepsis, unspecified organism: Secondary | ICD-10-CM | POA: Diagnosis present

## 2016-05-22 DIAGNOSIS — R300 Dysuria: Secondary | ICD-10-CM | POA: Diagnosis not present

## 2016-05-22 DIAGNOSIS — E8770 Fluid overload, unspecified: Secondary | ICD-10-CM | POA: Diagnosis present

## 2016-05-22 DIAGNOSIS — B9620 Unspecified Escherichia coli [E. coli] as the cause of diseases classified elsewhere: Secondary | ICD-10-CM | POA: Diagnosis present

## 2016-05-22 DIAGNOSIS — K5792 Diverticulitis of intestine, part unspecified, without perforation or abscess without bleeding: Secondary | ICD-10-CM | POA: Diagnosis present

## 2016-05-22 DIAGNOSIS — J8410 Pulmonary fibrosis, unspecified: Secondary | ICD-10-CM | POA: Diagnosis present

## 2016-05-22 DIAGNOSIS — R51 Headache: Secondary | ICD-10-CM

## 2016-05-22 DIAGNOSIS — Z8249 Family history of ischemic heart disease and other diseases of the circulatory system: Secondary | ICD-10-CM

## 2016-05-22 HISTORY — DX: Pulmonary fibrosis, unspecified: J84.10

## 2016-05-22 HISTORY — DX: Interstitial pulmonary disease, unspecified: J84.9

## 2016-05-22 LAB — COMPREHENSIVE METABOLIC PANEL
ALK PHOS: 61 U/L (ref 38–126)
ALT: 10 U/L — AB (ref 14–54)
AST: 18 U/L (ref 15–41)
Albumin: 3.9 g/dL (ref 3.5–5.0)
Anion gap: 8 (ref 5–15)
BILIRUBIN TOTAL: 1.1 mg/dL (ref 0.3–1.2)
BUN: 11 mg/dL (ref 6–20)
CALCIUM: 9.2 mg/dL (ref 8.9–10.3)
CO2: 26 mmol/L (ref 22–32)
CREATININE: 0.97 mg/dL (ref 0.44–1.00)
Chloride: 104 mmol/L (ref 101–111)
GFR calc Af Amer: 57 mL/min — ABNORMAL LOW (ref >60)
GFR, EST NON AFRICAN AMERICAN: 49 mL/min — AB (ref >60)
GLUCOSE: 152 mg/dL — AB (ref 65–99)
Potassium: 3.8 mmol/L (ref 3.5–5.1)
Sodium: 138 mmol/L (ref 135–145)
TOTAL PROTEIN: 7.6 g/dL (ref 6.5–8.1)

## 2016-05-22 LAB — CBC
HCT: 42.4 % (ref 36.0–46.0)
Hemoglobin: 14.4 g/dL (ref 12.0–15.0)
MCH: 33.4 pg (ref 26.0–34.0)
MCHC: 34 g/dL (ref 30.0–36.0)
MCV: 98.4 fL (ref 78.0–100.0)
PLATELETS: 218 10*3/uL (ref 150–400)
RBC: 4.31 MIL/uL (ref 3.87–5.11)
RDW: 12.8 % (ref 11.5–15.5)
WBC: 11.8 10*3/uL — AB (ref 4.0–10.5)

## 2016-05-22 LAB — URINALYSIS, ROUTINE W REFLEX MICROSCOPIC
BILIRUBIN URINE: NEGATIVE
Glucose, UA: NEGATIVE mg/dL
Ketones, ur: 5 mg/dL — AB
NITRITE: NEGATIVE
PH: 6 (ref 5.0–8.0)
Protein, ur: 30 mg/dL — AB
SPECIFIC GRAVITY, URINE: 1.015 (ref 1.005–1.030)

## 2016-05-22 LAB — LIPASE, BLOOD: Lipase: 20 U/L (ref 11–51)

## 2016-05-22 MED ORDER — FENTANYL CITRATE (PF) 100 MCG/2ML IJ SOLN: 50.0000 ug | Freq: Once | INTRAMUSCULAR | Status: DC

## 2016-05-22 MED ORDER — IOPAMIDOL (ISOVUE-300) INJECTION 61%
INTRAVENOUS | Status: AC
  Filled 2016-05-22: qty 100

## 2016-05-22 MED ORDER — IOPAMIDOL (ISOVUE-300) INJECTION 61%
100.0000 mL | Freq: Once | INTRAVENOUS | Status: AC | PRN
  Administered 2016-05-23: 100 mL via INTRAVENOUS

## 2016-05-22 MED ORDER — SODIUM CHLORIDE 0.9 % IV BOLUS (SEPSIS)
1000.0000 mL | Freq: Once | INTRAVENOUS | Status: AC
  Administered 2016-05-22: 1000 mL via INTRAVENOUS

## 2016-05-22 MED ORDER — ONDANSETRON HCL 4 MG/2ML IJ SOLN: 4.0000 mg | Freq: Once | INTRAMUSCULAR | Status: DC

## 2016-05-22 MED ORDER — ACETAMINOPHEN 650 MG RE SUPP
650.0000 mg | Freq: Once | RECTAL | Status: AC
  Administered 2016-05-23: 650 mg via RECTAL
  Filled 2016-05-22: qty 1

## 2016-05-22 MED ORDER — CIPROFLOXACIN IN D5W 400 MG/200ML IV SOLN
400.0000 mg | Freq: Once | INTRAVENOUS | Status: AC
  Administered 2016-05-23: 400 mg via INTRAVENOUS
  Filled 2016-05-22: qty 200

## 2016-05-22 MED ORDER — IOPAMIDOL (ISOVUE-300) INJECTION 61%
INTRAVENOUS | Status: AC
  Administered 2016-05-23: 100 mL via INTRAVENOUS
  Filled 2016-05-22: qty 30

## 2016-05-22 NOTE — ED Provider Notes (Signed)
[facility]-EMERGENCY DEPT Provider Note   CSN: [id number] Arrival date & time: 05/22/16  2126 By signing my name below, I, Shelia Black, attest that this documentation has been prepared under the direction and in the presence of non-physician practitioner, Antony Madura, PA-C.Marland Kitchen Electronically Signed: Levon Black, Scribe. 05/22/2016. 11:13 PM.    History   Chief Complaint Chief Complaint  Patient presents with  . Abdominal Pain   HPI Shelia Black is a 83 y.o. female with a history of CKD, DM, diverticulitis, hypothyroidism, who presents to the Emergency Department complaining of diffuse, gradually improving abdominal pain onset this morning. Pt reports associated nausea and hematochezia. Per pt, she had bright red blood in her stool today. Pt is febrile upon exam, but is unsure of any fever at home today.  She expresses generalized malaise yesterday and states "I just didn't feel good". She was seen at [facility] today where she has a WBC of 12,300 and was sent to the ED for further evaluation. No medications taken PTA. No alleviating or modifying factors noted. She reports abdominal SHx of appendectomy, and denies any history of any other surgeries. She denies any vomiting, dysuria, or diarrhea.   The history is provided by the patient and medical records. No language interpreter was used.    Past Medical History:  Diagnosis Date  . Arthritis   . Cataract   . Chronic cough   . Diabetes mellitus without complication (HCC)   . Diverticular disease   . GERD (gastroesophageal reflux disease)   . Hypothyroidism   . Thyroid disease     Patient Active Problem List   Diagnosis Date Noted  . Sepsis (HCC) 05/23/2016  . Diverticulitis 05/23/2016  . UTI (lower urinary tract infection)   . Hyponatremia 02/20/2015  . Hyperglycemia 02/20/2015  . Diabetes mellitus type 2 in obese (HCC) 02/20/2015  . CKD (chronic kidney disease) stage 3, GFR 30-59 ml/min 02/20/2015  . UTI (urinary  tract infection) 04/29/2014  . Diverticulitis of colon with bleeding 04/29/2014  . Acute respiratory failure with hypoxia (HCC) 04/29/2014  . Acute renal failure (HCC) 04/29/2014  . Hyperkalemia 04/29/2014  . Nausea and vomiting 04/29/2014    Past Surgical History:  Procedure Laterality Date  . APPENDECTOMY    . INTRAOCULAR LENS INSERTION  1997  . JOINT REPLACEMENT    . REPLACEMENT TOTAL KNEE BILATERAL      OB History    No data available       Home Medications    Prior to Admission medications   Medication Sig Start Date End Date Taking? Authorizing Provider  acetaminophen (TYLENOL) 500 MG tablet Take 1,000 mg by mouth 2 (two) times daily.   Yes Historical Provider, MD  albuterol (PROVENTIL HFA;VENTOLIN HFA) 108 (90 Base) MCG/ACT inhaler Inhale 1 puff into the lungs every 6 (six) hours as needed for wheezing or shortness of breath.   Yes Historical Provider, MD  cholecalciferol (VITAMIN D) 1000 units tablet Take 1,000 Units by mouth daily.   Yes Historical Provider, MD  diphenoxylate-atropine (LOMOTIL) 2.5-0.025 MG tablet Take 1 tablet by mouth 4 (four) times daily as needed for diarrhea or loose stools. diarrhea 11/11/12  Yes Historical Provider, MD  Hydrocodone-Chlorpheniramine 5-4 MG/5ML SOLN Take 5 mLs by mouth 2 (two) times daily as needed (cough).    Yes Historical Provider, MD  ibuprofen (ADVIL,MOTRIN) 200 MG tablet Take 800 mg by mouth every 4 (four) hours as needed for moderate pain.   Yes Historical Provider, MD  levothyroxine (SYNTHROID, LEVOTHROID)  125 MCG tablet Take 125 mcg by mouth daily before breakfast.   Yes Historical Provider, MD  mirtazapine (REMERON) 15 MG tablet Take 15 mg by mouth at bedtime.   Yes Historical Provider, MD  omeprazole (PRILOSEC) 20 MG capsule Take 20 mg by mouth daily.   Yes Historical Provider, MD  Probiotic Product (ALIGN PO) Take 1 tablet by mouth daily.   Yes Historical Provider, MD  vitamin C (ASCORBIC ACID) 500 MG tablet Take 500 mg by  mouth daily.   Yes Historical Provider, MD  ciprofloxacin (CIPRO) 500 MG tablet Take 1 tablet (500 mg total) by mouth 2 (two) times daily. Patient not taking: Reported on 05/22/2016 02/23/15   Marinda Elk, MD  metroNIDAZOLE (FLAGYL) 500 MG tablet Take 1 tablet (500 mg total) by mouth every 8 (eight) hours. Patient not taking: Reported on 05/22/2016 02/23/15   Marinda Elk, MD  naproxen (NAPROSYN) 500 MG tablet Take 1 tablet (500 mg total) by mouth 2 (two) times daily. Patient not taking: Reported on 05/22/2016 03/24/15   Arby Barrette, MD  orphenadrine (NORFLEX) 100 MG tablet Take 1 tablet (100 mg total) by mouth 2 (two) times daily. Patient not taking: Reported on 05/22/2016 03/24/15   Arby Barrette, MD  traMADol-acetaminophen (ULTRACET) 37.5-325 MG tablet Take 2 tablets by mouth every 6 (six) hours as needed. Patient not taking: Reported on 05/22/2016 03/24/15   Arby Barrette, MD    Family History Family History  Problem Relation Age of Onset  . Hypertension Child     Social History Social History  Substance Use Topics  . Smoking status: Never Smoker  . Smokeless tobacco: Never Used  . Alcohol use No     Allergies   Codeine; Percocet [oxycodone-acetaminophen]; and Penicillins   Review of Systems Review of Systems All systems reviewed and are negative for acute change except as noted in the HPI.   Physical Exam Updated Vital Signs BP (!) 151/94   Pulse 90   Temp (!) 100.8 F (38.2 C) (Rectal)   Resp 17   Ht  (1.626 m)   Wt 77.1 kg   LMP  (LMP Unknown)   SpO2 91%   BMI 29.18 kg/m   Physical Exam  Constitutional: She is oriented to person, place, and time. She appears well-developed and well-nourished. No distress.  Appears fatigued, but nontoxic. Pleasant.  HENT:  Head: Normocephalic and atraumatic.  Eyes: Conjunctivae and EOM are normal. No scleral icterus.  Neck: Normal range of motion.  Cardiovascular: Normal rate, regular rhythm and intact  distal pulses.   Pulmonary/Chest: Effort normal. No respiratory distress. She has no wheezes.  Respirations even and unlabored  Abdominal: Soft. She exhibits no mass. There is tenderness. There is guarding.  Soft abdomen with focal tenderness in the left lower quadrant and right lower quadrant. There is mild voluntary guarding in the right lower quadrant. No rigidity, distention, or peritoneal signs.  Musculoskeletal: Normal range of motion.  Neurological: She is alert and oriented to person, place, and time. She exhibits normal muscle tone. Coordination normal.  Skin: Skin is warm and dry. No rash noted. She is not diaphoretic. No erythema. No pallor.  Psychiatric: She has a normal mood and affect. Her behavior is normal.  Nursing note and vitals reviewed.    ED Treatments / Results  DIAGNOSTIC STUDIES:  Oxygen Saturation is 92% on RA, low by my interpretation.    COORDINATION OF CARE:  11:11 PM Discussed treatment plan which includes CT with pt  at bedside and pt agreed to plan.   Labs (all labs ordered are listed, but only abnormal results are displayed) Labs Reviewed  COMPREHENSIVE METABOLIC PANEL - Abnormal; Notable for the following:       Result Value   Glucose, Bld 152 (*)    ALT 10 (*)    GFR calc non Af Amer 49 (*)    GFR calc Af Amer 57 (*)    All other components within normal limits  CBC - Abnormal; Notable for the following:    WBC 11.8 (*)    All other components within normal limits  URINALYSIS, ROUTINE W REFLEX MICROSCOPIC - Abnormal; Notable for the following:    Color, Urine AMBER (*)    APPearance CLOUDY (*)    Hgb urine dipstick LARGE (*)    Ketones, ur 5 (*)    Protein, ur 30 (*)    Leukocytes, UA LARGE (*)    Bacteria, UA MANY (*)    Squamous Epithelial / LPF 0-5 (*)    All other components within normal limits  CULTURE, BLOOD (ROUTINE X 2)  CULTURE, BLOOD (ROUTINE X 2)  URINE CULTURE  LIPASE, BLOOD    EKG  EKG Interpretation None        Radiology Ct Abdomen Pelvis W Contrast  Result Date: 05/23/2016 CLINICAL DATA:  Initial evaluation for acute abdominal pain. Blood in stool. History of diverticulitis. EXAM: CT ABDOMEN AND PELVIS WITH CONTRAST TECHNIQUE: Multidetector CT imaging of the abdomen and pelvis was performed using the standard protocol following bolus administration of intravenous contrast. CONTRAST:  100 cc of Isovue-300. COMPARISON:  None available. FINDINGS: Lower chest: Scattered fibrotic lung changes present within the visualized lung bases. Visualized lung bases are otherwise clear. Hepatobiliary: Mild diffuse hepatic steatosis. Liver otherwise unremarkable. Gallbladder within normal limits. No biliary dilatation. Pancreas: Pancreas somewhat atrophic but without acute inflammatory changes or mass lesion. Spleen: Spleen within normal limits. Adrenals/Urinary Tract: Adrenal glands are normal. Kidneys equal in size with symmetric enhancement. 2.3 cm cysts noted within left kidney. Additional subcentimeter hypodensities too small the characterize, but statistically likely reflects small cysts as well. No nephrolithiasis, hydronephrosis, or focal enhancing renal mass. No hydroureter. Bladder within normal limits. Stomach/Bowel: Small hiatal hernia noted. Diverticulum present at the gastric fundus without associated inflammation. Stomach otherwise unremarkable. No evidence for bowel obstruction. Extensive diverticulosis seen involving the descending and sigmoid colon. Hazy inflammatory stranding about a few diverticula within the sigmoid colon at the lower mid pelvis, compatible with acute sigmoid diverticulitis (series 2, image 65). No evidence for perforation or other complication. No other acute inflammatory changes seen about the bowels. Vascular/Lymphatic: Advanced atheromatous plaque within the intra-abdominal aorta. No aneurysm. No adenopathy. Reproductive: Uterus and ovaries within normal limits. Other: No free air or  fluid. Small fat containing paraumbilical hernia noted. Musculoskeletal: No acute osseous abnormality. No worrisome lytic or blastic osseous lesions. IMPRESSION: 1. Findings consistent with acute sigmoid diverticulitis. No complication identified. 2. Additional chronic findings as above. No other acute abnormality within the abdomen and pelvis. Electronically Signed   By: Rise Mu M.D.   On: 05/23/2016 01:47    Procedures Procedures (including critical care time)  Medications Ordered in ED Medications  fentaNYL (SUBLIMAZE) injection 50 mcg (0 mcg Intravenous Hold 05/22/16 2319)  ondansetron (ZOFRAN) injection 4 mg (0 mg Intravenous Hold 05/22/16 2319)  metroNIDAZOLE (FLAGYL) IVPB 500 mg (not administered)  sodium chloride 0.9 % bolus 1,000 mL (0 mLs Intravenous Stopped 05/23/16 0142)  ciprofloxacin (CIPRO) IVPB  400 mg (0 mg Intravenous Stopped 05/23/16 0142)  acetaminophen (TYLENOL) suppository 650 mg (650 mg Rectal Given 05/23/16 0015)  iopamidol (ISOVUE-300) 61 % injection 100 mL (100 mLs Intravenous Contrast Given 05/23/16 0115)     Initial Impression / Assessment and Plan / ED Course  I have reviewed the triage vital signs and the nursing notes.  Pertinent labs & imaging results that were available during my care of the patient were reviewed by me and considered in my medical decision making (see chart for details).    83 year old female presents to the emergency department for evaluation of abdominal pain. Patient noted to be febrile on arrival. She was given Tylenol as well as fluids. Initially, abdominal discomfort thought secondary to urinary tract infection given results of urinalysis. Urine sent for culture. Patient did, however, reveal onset of hematochezia today. CT scan was performed which shows evidence of acute sigmoid diverticulitis. No evidence of perforation or abscess.  Patient has been covered with ciprofloxacin and Flagyl for management of UTI and diverticulitis.  I have discussed further management with family. Daughter states that she is not comfortable with outpatient management at this time. Case discussed with Dr. Clyde Lundborg for overnight observation and additional antibiotics. Dr. Clyde Lundborg to evaluate in the ED for admission.   Final Clinical Impressions(s) / ED Diagnoses   Final diagnoses:  Diverticulitis of sigmoid colon  Acute UTI    New Prescriptions New Prescriptions   No medications on file    I personally performed the services described in this documentation, which was scribed in my presence. The recorded information has been reviewed and is accurate.      Antony Madura, PA-C 05/23/16 0250    Mancel Bale, MD 05/24/16 [phone]

## 2016-05-22 NOTE — ED Triage Notes (Signed)
Since yesterday abdominal pain with fever and nausea and went to [facility] today and sent here for wbc 12,300 sent her for more testing.

## 2016-05-22 NOTE — ED Provider Notes (Signed)
  Face-to-face evaluation   History: Elderly female presents for evaluation of vague abdominal discomfort, with decreased oral intake.  She has had low-grade fever and nausea.  She went to her PCP and was diagnosed with leukocytosis.  Physical exam: Alert elderly female.  Abdomen mild right mid abdominal tenderness without rebound tenderness.  Medical screening examination/treatment/procedure(s) were conducted as a shared visit with non-physician practitioner(s) and myself.  I personally evaluated the patient during the encounter    Mancel Bale, MD 05/24/16 [phone]

## 2016-05-23 ENCOUNTER — Emergency Department (HOSPITAL_COMMUNITY): Payer: Medicare Other

## 2016-05-23 ENCOUNTER — Encounter (HOSPITAL_COMMUNITY): Payer: Self-pay | Admitting: Emergency Medicine

## 2016-05-23 DIAGNOSIS — F329 Major depressive disorder, single episode, unspecified: Secondary | ICD-10-CM | POA: Diagnosis present

## 2016-05-23 DIAGNOSIS — J8410 Pulmonary fibrosis, unspecified: Secondary | ICD-10-CM | POA: Diagnosis present

## 2016-05-23 DIAGNOSIS — J849 Interstitial pulmonary disease, unspecified: Secondary | ICD-10-CM

## 2016-05-23 DIAGNOSIS — A419 Sepsis, unspecified organism: Secondary | ICD-10-CM | POA: Diagnosis present

## 2016-05-23 DIAGNOSIS — R51 Headache: Secondary | ICD-10-CM | POA: Diagnosis not present

## 2016-05-23 DIAGNOSIS — K5733 Diverticulitis of large intestine without perforation or abscess with bleeding: Secondary | ICD-10-CM | POA: Diagnosis present

## 2016-05-23 DIAGNOSIS — B9620 Unspecified Escherichia coli [E. coli] as the cause of diseases classified elsewhere: Secondary | ICD-10-CM | POA: Diagnosis present

## 2016-05-23 DIAGNOSIS — J449 Chronic obstructive pulmonary disease, unspecified: Secondary | ICD-10-CM | POA: Diagnosis present

## 2016-05-23 DIAGNOSIS — K219 Gastro-esophageal reflux disease without esophagitis: Secondary | ICD-10-CM | POA: Diagnosis present

## 2016-05-23 DIAGNOSIS — K5792 Diverticulitis of intestine, part unspecified, without perforation or abscess without bleeding: Secondary | ICD-10-CM | POA: Diagnosis not present

## 2016-05-23 DIAGNOSIS — N390 Urinary tract infection, site not specified: Secondary | ICD-10-CM | POA: Insufficient documentation

## 2016-05-23 DIAGNOSIS — G43809 Other migraine, not intractable, without status migrainosus: Secondary | ICD-10-CM | POA: Diagnosis not present

## 2016-05-23 DIAGNOSIS — R300 Dysuria: Secondary | ICD-10-CM | POA: Diagnosis present

## 2016-05-23 DIAGNOSIS — E669 Obesity, unspecified: Secondary | ICD-10-CM | POA: Diagnosis present

## 2016-05-23 DIAGNOSIS — E039 Hypothyroidism, unspecified: Secondary | ICD-10-CM | POA: Diagnosis present

## 2016-05-23 DIAGNOSIS — K5732 Diverticulitis of large intestine without perforation or abscess without bleeding: Secondary | ICD-10-CM | POA: Diagnosis not present

## 2016-05-23 DIAGNOSIS — E1169 Type 2 diabetes mellitus with other specified complication: Secondary | ICD-10-CM | POA: Diagnosis not present

## 2016-05-23 DIAGNOSIS — R0602 Shortness of breath: Secondary | ICD-10-CM | POA: Diagnosis not present

## 2016-05-23 DIAGNOSIS — E119 Type 2 diabetes mellitus without complications: Secondary | ICD-10-CM | POA: Diagnosis present

## 2016-05-23 DIAGNOSIS — Z8249 Family history of ischemic heart disease and other diseases of the circulatory system: Secondary | ICD-10-CM | POA: Diagnosis not present

## 2016-05-23 DIAGNOSIS — F32A Depression, unspecified: Secondary | ICD-10-CM | POA: Diagnosis present

## 2016-05-23 DIAGNOSIS — Z96653 Presence of artificial knee joint, bilateral: Secondary | ICD-10-CM | POA: Diagnosis present

## 2016-05-23 DIAGNOSIS — G43909 Migraine, unspecified, not intractable, without status migrainosus: Secondary | ICD-10-CM | POA: Diagnosis present

## 2016-05-23 DIAGNOSIS — E8770 Fluid overload, unspecified: Secondary | ICD-10-CM | POA: Diagnosis present

## 2016-05-23 DIAGNOSIS — B965 Pseudomonas (aeruginosa) (mallei) (pseudomallei) as the cause of diseases classified elsewhere: Secondary | ICD-10-CM | POA: Diagnosis present

## 2016-05-23 LAB — CBC
HCT: 38.5 % (ref 36.0–46.0)
Hemoglobin: 12.7 g/dL (ref 12.0–15.0)
MCH: 32.7 pg (ref 26.0–34.0)
MCHC: 33 g/dL (ref 30.0–36.0)
MCV: 99.2 fL (ref 78.0–100.0)
PLATELETS: 193 10*3/uL (ref 150–400)
RBC: 3.88 MIL/uL (ref 3.87–5.11)
RDW: 12.8 % (ref 11.5–15.5)
WBC: 12.6 10*3/uL — ABNORMAL HIGH (ref 4.0–10.5)

## 2016-05-23 LAB — T4, FREE: Free T4: 1.85 ng/dL — ABNORMAL HIGH (ref 0.61–1.12)

## 2016-05-23 LAB — APTT: aPTT: 32 seconds (ref 24–36)

## 2016-05-23 LAB — LACTIC ACID, PLASMA: LACTIC ACID, VENOUS: 1.5 mmol/L (ref 0.5–1.9)

## 2016-05-23 LAB — BASIC METABOLIC PANEL
Anion gap: 11 (ref 5–15)
BUN: 9 mg/dL (ref 6–20)
CALCIUM: 8.4 mg/dL — AB (ref 8.9–10.3)
CO2: 21 mmol/L — AB (ref 22–32)
CREATININE: 0.81 mg/dL (ref 0.44–1.00)
Chloride: 108 mmol/L (ref 101–111)
GFR calc Af Amer: >60 mL/min (ref >60)
GFR calc non Af Amer: >60 mL/min (ref >60)
GLUCOSE: 120 mg/dL — AB (ref 65–99)
Potassium: 3.6 mmol/L (ref 3.5–5.1)
Sodium: 140 mmol/L (ref 135–145)

## 2016-05-23 LAB — GLUCOSE, CAPILLARY
Glucose-Capillary: 138 mg/dL — ABNORMAL HIGH (ref 65–99)
Glucose-Capillary: 150 mg/dL — ABNORMAL HIGH (ref 65–99)
Glucose-Capillary: 120 mg/dL — ABNORMAL HIGH (ref 65–99)
Glucose-Capillary: 103 mg/dL — ABNORMAL HIGH (ref 65–99)

## 2016-05-23 LAB — TSH: TSH: <0.01 u[IU]/mL — ABNORMAL LOW (ref 0.350–4.500)

## 2016-05-23 LAB — PROTIME-INR
INR: 1.16
PROTHROMBIN TIME: 14.8 s (ref 11.4–15.2)

## 2016-05-23 LAB — PROCALCITONIN: Procalcitonin: 0.2 ng/mL

## 2016-05-23 MED ORDER — RISAQUAD PO CAPS
1.0000 | ORAL_CAPSULE | Freq: Every day | ORAL | Status: DC
  Administered 2016-05-23 – 2016-05-27 (×5): 1 via ORAL
  Filled 2016-05-23 (×5): qty 1

## 2016-05-23 MED ORDER — ONDANSETRON HCL 4 MG/2ML IJ SOLN
4.0000 mg | Freq: Three times a day (TID) | INTRAMUSCULAR | Status: DC | PRN
  Administered 2016-05-24: 4 mg via INTRAVENOUS
  Filled 2016-05-23: qty 2

## 2016-05-23 MED ORDER — PANTOPRAZOLE SODIUM 40 MG PO TBEC
40.0000 mg | DELAYED_RELEASE_TABLET | Freq: Every day | ORAL | Status: DC
Start: 2016-05-23 — End: 2016-05-27
  Administered 2016-05-23 – 2016-05-27 (×5): 40 mg via ORAL
  Filled 2016-05-23 (×5): qty 1

## 2016-05-23 MED ORDER — PHENAZOPYRIDINE HCL 200 MG PO TABS
200.0000 mg | ORAL_TABLET | Freq: Three times a day (TID) | ORAL | Status: AC
  Administered 2016-05-23 – 2016-05-25 (×6): 200 mg via ORAL
  Filled 2016-05-23 (×6): qty 1

## 2016-05-23 MED ORDER — SODIUM CHLORIDE 0.9 % IV BOLUS (SEPSIS)
1500.0000 mL | Freq: Once | INTRAVENOUS | Status: AC
  Administered 2016-05-23: 1500 mL via INTRAVENOUS

## 2016-05-23 MED ORDER — DIPHENOXYLATE-ATROPINE 2.5-0.025 MG PO TABS
1.0000 | ORAL_TABLET | Freq: Four times a day (QID) | ORAL | Status: DC | PRN
  Administered 2016-05-23: 1 via ORAL
  Filled 2016-05-23: qty 1

## 2016-05-23 MED ORDER — ZOLPIDEM TARTRATE 5 MG PO TABS: 5.0000 mg | ORAL_TABLET | Freq: Every evening | ORAL | Status: DC | PRN

## 2016-05-23 MED ORDER — MORPHINE SULFATE (PF) 4 MG/ML IV SOLN: 1.0000 mg | INTRAVENOUS | Status: DC | PRN

## 2016-05-23 MED ORDER — INSULIN ASPART 100 UNIT/ML ~~LOC~~ SOLN
0.0000 [IU] | Freq: Three times a day (TID) | SUBCUTANEOUS | Status: DC
  Administered 2016-05-23 – 2016-05-24 (×3): 1 [IU] via SUBCUTANEOUS
  Administered 2016-05-25: 2 [IU] via SUBCUTANEOUS
  Administered 2016-05-26 (×2): 1 [IU] via SUBCUTANEOUS
  Administered 2016-05-27: 2 [IU] via SUBCUTANEOUS

## 2016-05-23 MED ORDER — LEVOTHYROXINE SODIUM 125 MCG PO TABS
125.0000 ug | ORAL_TABLET | Freq: Every day | ORAL | Status: DC
Start: 2016-05-23 — End: 2016-05-24
  Administered 2016-05-23 – 2016-05-24 (×2): 125 ug via ORAL
  Filled 2016-05-23 (×5): qty 1

## 2016-05-23 MED ORDER — METRONIDAZOLE IN NACL 5-0.79 MG/ML-% IV SOLN
500.0000 mg | Freq: Three times a day (TID) | INTRAVENOUS | Status: DC
  Administered 2016-05-23 – 2016-05-25 (×7): 500 mg via INTRAVENOUS
  Filled 2016-05-23 (×8): qty 100

## 2016-05-23 MED ORDER — SODIUM CHLORIDE 0.9% FLUSH
3.0000 mL | Freq: Two times a day (BID) | INTRAVENOUS | Status: DC
Start: 2016-05-23 — End: 2016-05-27
  Administered 2016-05-23 – 2016-05-27 (×6): 3 mL via INTRAVENOUS

## 2016-05-23 MED ORDER — MIRTAZAPINE 30 MG PO TABS
15.0000 mg | ORAL_TABLET | Freq: Every day | ORAL | Status: DC
  Administered 2016-05-23 – 2016-05-26 (×4): 15 mg via ORAL
  Filled 2016-05-23: qty 1
  Filled 2016-05-23: qty 0.5
  Filled 2016-05-23: qty 1
  Filled 2016-05-23 (×2): qty 0.5
  Filled 2016-05-23: qty 1
  Filled 2016-05-23: qty 0.5
  Filled 2016-05-23: qty 1

## 2016-05-23 MED ORDER — ALBUTEROL SULFATE (2.5 MG/3ML) 0.083% IN NEBU: 2.5000 mg | INHALATION_SOLUTION | RESPIRATORY_TRACT | Status: DC | PRN

## 2016-05-23 MED ORDER — CIPROFLOXACIN IN D5W 400 MG/200ML IV SOLN
400.0000 mg | Freq: Two times a day (BID) | INTRAVENOUS | Status: DC
  Administered 2016-05-23 – 2016-05-25 (×5): 400 mg via INTRAVENOUS
  Filled 2016-05-23 (×5): qty 200

## 2016-05-23 MED ORDER — METRONIDAZOLE IN NACL 5-0.79 MG/ML-% IV SOLN
500.0000 mg | Freq: Once | INTRAVENOUS | Status: AC
  Administered 2016-05-23: 500 mg via INTRAVENOUS
  Filled 2016-05-23: qty 100

## 2016-05-23 MED ORDER — ACETAMINOPHEN 325 MG PO TABS
650.0000 mg | ORAL_TABLET | Freq: Four times a day (QID) | ORAL | Status: DC | PRN
  Administered 2016-05-24 – 2016-05-25 (×5): 650 mg via ORAL
  Filled 2016-05-23 (×5): qty 2

## 2016-05-23 MED ORDER — SODIUM CHLORIDE 0.9 % IV SOLN
INTRAVENOUS | Status: DC
  Administered 2016-05-23 (×2): via INTRAVENOUS

## 2016-05-23 MED ORDER — VITAMIN C 500 MG PO TABS
500.0000 mg | ORAL_TABLET | Freq: Every day | ORAL | Status: DC
  Administered 2016-05-23 – 2016-05-27 (×5): 500 mg via ORAL
  Filled 2016-05-23 (×5): qty 1

## 2016-05-23 MED ORDER — HYDROCOD POLST-CPM POLST ER 10-8 MG/5ML PO SUER
5.0000 mL | Freq: Two times a day (BID) | ORAL | Status: DC | PRN
Start: 2016-05-23 — End: 2016-05-26

## 2016-05-23 MED ORDER — INSULIN ASPART 100 UNIT/ML ~~LOC~~ SOLN: 0.0000 [IU] | Freq: Every day | SUBCUTANEOUS | Status: DC

## 2016-05-23 MED ORDER — VITAMIN D3 25 MCG (1000 UNIT) PO TABS
1000.0000 [IU] | ORAL_TABLET | Freq: Every day | ORAL | Status: DC
  Administered 2016-05-23 – 2016-05-27 (×5): 1000 [IU] via ORAL
  Filled 2016-05-23 (×5): qty 1

## 2016-05-23 NOTE — ED Notes (Signed)
ED Provider at bedside. 

## 2016-05-23 NOTE — H&P (Signed)
History and Physical    [name] JME:[id number] DOB: [birth year] DOA: 05/22/2016  Referring MD/NP/PA:   PCP: [name], PA-C   Patient coming from:  The patient is coming from home.  At baseline, pt is independent for most of ADL.   Chief Complaint: Abdominal pain, dysuria  HPI: Shelia Black is a 83 y.o. female with medical history significant of diet-controlled diabetes, COPD, poor hearing. diverticulitis, pulmonary fibrosis, interstitial lung disease, GERD, hypothyroidism, depression, who presents with abdominal pain and dysuria.  Patient states that she started having abdominal pain since this morning. It is located in the lower abdomen, constant, sharp, 8 out of 10 in severity, nonradiating. It is associated with nausea, but no vomiting or diarrhea. He has had several episodes of hematochezia with streak amount of bright red  Blood. Patient also has fever and chills. She complains of dysuria, burning on urination, but no urinary frequency. Patient has chronic cough due to pulmonary fibrosis, which is at baseline. No chest pain or shortness of breath. No wheezing. Patient does not have unilateral weakness.  ED Course: pt was found to have WBC 11.8, positive urinalysis with large amount of leukocyte, electrolytes renal function okay, temperature 100.8, oxygen saturation 83-92% on room air, CT abdomen/pelvis showed sigmoid diverticulitis. Patient is placed on telemetry bed for observation.  Review of Systems:   General: has fevers, chills, no changes in body weight, has poor appetite, has fatigue HEENT: no blurry vision, hearing changes or sore throat Respiratory: no dyspnea, has coughing, no wheezing CV: no chest pain, no palpitations GI: has nausea, abdominal pain, no vomiting, diarrhea, constipation GU: has dysuria, burning on urination Ext: no leg edema Neuro: no unilateral weakness, numbness, or tingling, no vision change or hearing loss Skin: no rash, no  skin tear. MSK: No muscle spasm, no deformity, no limitation of range of movement in spin Heme: No easy bruising.  Travel history: No recent long distant travel.  Allergy:  Allergies  Allergen Reactions  . Codeine     Nausea and vomiting  . Percocet [Oxycodone-Acetaminophen] Nausea And Vomiting  . Penicillins Hives and Rash    Has patient had a PCN reaction causing immediate rash, facial/tongue/throat swelling, SOB or lightheadedness with hypotension: no Has patient had a PCN reaction causing severe rash involving mucus membranes or skin necrosis: unknown Has patient had a PCN reaction that required hospitalization : unknown Has patient had a PCN reaction occurring within the last 10 years: no If all of the above answers are "NO", then may proceed with Cephalosporin use.     Past Medical History:  Diagnosis Date  . Arthritis   . Cataract   . Chronic cough   . Diabetes mellitus without complication ([place])   . Diverticular disease   . GERD (gastroesophageal reflux disease)   . Hypothyroidism   . ILD (interstitial lung disease) ([place])   . Pulmonary fibrosis ([place])   . Thyroid disease     Past Surgical History:  Procedure Laterality Date  . APPENDECTOMY    . INTRAOCULAR LENS INSERTION  1997  . JOINT REPLACEMENT    . REPLACEMENT TOTAL KNEE BILATERAL      Social History:  reports that she has never smoked. She has never used smokeless tobacco. She reports that she does not drink alcohol or use drugs.  Family History:  Family History  Problem Relation Age of Onset  . Hypertension Child      Prior to Admission medications   Medication Sig Start Date End Date  Taking? Authorizing Provider  acetaminophen (TYLENOL) 500 MG tablet Take 1,000 mg by mouth 2 (two) times daily.   Yes Historical Provider, MD  albuterol (PROVENTIL HFA;VENTOLIN HFA) 108 (90 Base) MCG/ACT inhaler Inhale 1 puff into the lungs every 6 (six) hours as needed for wheezing or shortness of breath.   Yes  Historical Provider, MD  cholecalciferol (VITAMIN D) 1000 units tablet Take 1,000 Units by mouth daily.   Yes Historical Provider, MD  diphenoxylate-atropine (LOMOTIL) 2.5-0.025 MG tablet Take 1 tablet by mouth 4 (four) times daily as needed for diarrhea or loose stools. diarrhea 11/11/12  Yes Historical Provider, MD  Hydrocodone-Chlorpheniramine 5-4 MG/5ML SOLN Take 5 mLs by mouth 2 (two) times daily as needed (cough).    Yes Historical Provider, MD  ibuprofen (ADVIL,MOTRIN) 200 MG tablet Take 800 mg by mouth every 4 (four) hours as needed for moderate pain.   Yes Historical Provider, MD  levothyroxine (SYNTHROID, LEVOTHROID) 125 MCG tablet Take 125 mcg by mouth daily before breakfast.   Yes Historical Provider, MD  mirtazapine (REMERON) 15 MG tablet Take 15 mg by mouth at bedtime.   Yes Historical Provider, MD  omeprazole (PRILOSEC) 20 MG capsule Take 20 mg by mouth daily.   Yes Historical Provider, MD  Probiotic Product (ALIGN PO) Take 1 tablet by mouth daily.   Yes Historical Provider, MD  vitamin C (ASCORBIC ACID) 500 MG tablet Take 500 mg by mouth daily.   Yes Historical Provider, MD  ciprofloxacin (CIPRO) 500 MG tablet Take 1 tablet (500 mg total) by mouth 2 (two) times daily. Patient not taking: Reported on 05/22/2016 02/23/15   Charlynne Cousins, MD  metroNIDAZOLE (FLAGYL) 500 MG tablet Take 1 tablet (500 mg total) by mouth every 8 (eight) hours. Patient not taking: Reported on 05/22/2016 02/23/15   Charlynne Cousins, MD  naproxen (NAPROSYN) 500 MG tablet Take 1 tablet (500 mg total) by mouth 2 (two) times daily. Patient not taking: Reported on 05/22/2016 03/24/15   Charlesetta Shanks, MD  orphenadrine (NORFLEX) 100 MG tablet Take 1 tablet (100 mg total) by mouth 2 (two) times daily. Patient not taking: Reported on 05/22/2016 03/24/15   Charlesetta Shanks, MD  traMADol-acetaminophen (ULTRACET) 37.5-325 MG tablet Take 2 tablets by mouth every 6 (six) hours as needed. Patient not taking: Reported on  05/22/2016 03/24/15   Charlesetta Shanks, MD    Physical Exam: Vitals:   05/23/16 0006 05/23/16 0140 05/23/16 0252 05/23/16 0346  BP: 103/70 (!) 151/94  (!) 145/54  Pulse: 83 90  79  Resp: 16 17  16   Temp:   98.1 F (36.7 C) 98.3 F (36.8 C)  TempSrc:   Oral Oral  SpO2: 97% 91%  94%  Weight:    72.6 kg (160 lb 0.9 oz)  Height:    5' 4"  (1.626 m)   General: Not in acute distress HEENT:       Eyes: PERRL, EOMI, no scleral icterus.       ENT: No discharge from the ears and nose, no pharynx injection, no tonsillar enlargement.        Neck: No JVD, no bruit, no mass felt. Heme: No neck lymph node enlargement. Cardiac: S1/S2, RRR, No murmurs, No gallops or rubs. Respiratory: No rales, wheezing, rhonchi or rubs. GI: Soft, nondistended, has tenderness in lower abdomen, no rebound pain, no organomegaly, BS present. GU: No hematuria Ext: No pitting leg edema bilaterally. 2+DP/PT pulse bilaterally. Musculoskeletal: No joint deformities, No joint redness or warmth, no limitation of  ROM in spin. Skin: No rashes.  Neuro: Alert, oriented X3, cranial nerves II-XII grossly intact, moves all extremities normally.  Psych: Patient is not psychotic, no suicidal or hemocidal ideation.  Labs on Admission: I have personally reviewed following labs and imaging studies  CBC:  Recent Labs Lab 05/22/16 2218  WBC 11.8*  HGB 14.4  HCT 42.4  MCV 98.4  PLT 150   Basic Metabolic Panel:  Recent Labs Lab 05/22/16 2218  NA 138  K 3.8  CL 104  CO2 26  GLUCOSE 152*  BUN 11  CREATININE 0.97  CALCIUM 9.2   GFR: Estimated Creatinine Clearance: 36.2 mL/min (by C-G formula based on SCr of 0.97 mg/dL). Liver Function Tests:  Recent Labs Lab 05/22/16 2218  AST 18  ALT 10*  ALKPHOS 61  BILITOT 1.1  PROT 7.6  ALBUMIN 3.9    Recent Labs Lab 05/22/16 2218  LIPASE 20   No results for input(s): AMMONIA in the last 168 hours. Coagulation Profile: No results for input(s): INR, PROTIME in the  last 168 hours. Cardiac Enzymes: No results for input(s): CKTOTAL, CKMB, CKMBINDEX, TROPONINI in the last 168 hours. BNP (last 3 results) No results for input(s): PROBNP in the last 8760 hours. HbA1C: No results for input(s): HGBA1C in the last 72 hours. CBG: No results for input(s): GLUCAP in the last 168 hours. Lipid Profile: No results for input(s): CHOL, HDL, LDLCALC, TRIG, CHOLHDL, LDLDIRECT in the last 72 hours. Thyroid Function Tests: No results for input(s): TSH, T4TOTAL, FREET4, T3FREE, THYROIDAB in the last 72 hours. Anemia Panel: No results for input(s): VITAMINB12, FOLATE, FERRITIN, TIBC, IRON, RETICCTPCT in the last 72 hours. Urine analysis:    Component Value Date/Time   COLORURINE AMBER (A) 05/22/2016 2204   APPEARANCEUR CLOUDY (A) 05/22/2016 2204   LABSPEC 1.015 05/22/2016 2204   PHURINE 6.0 05/22/2016 2204   GLUCOSEU NEGATIVE 05/22/2016 2204   HGBUR LARGE (A) 05/22/2016 2204   BILIRUBINUR NEGATIVE 05/22/2016 2204   KETONESUR 5 (A) 05/22/2016 2204   PROTEINUR 30 (A) 05/22/2016 2204   UROBILINOGEN 0.2 04/29/2014 0856   NITRITE NEGATIVE 05/22/2016 2204   LEUKOCYTESUR LARGE (A) 05/22/2016 2204   Sepsis Labs: @LABRCNTIP (procalcitonin:4,lacticidven:4) )No results found for this or any previous visit (from the past 240 hour(s)).   Radiological Exams on Admission: Ct Abdomen Pelvis W Contrast  Result Date: 05/23/2016 CLINICAL DATA:  Initial evaluation for acute abdominal pain. Blood in stool. History of diverticulitis. EXAM: CT ABDOMEN AND PELVIS WITH CONTRAST TECHNIQUE: Multidetector CT imaging of the abdomen and pelvis was performed using the standard protocol following bolus administration of intravenous contrast. CONTRAST:  100 cc of Isovue-300. COMPARISON:  None available. FINDINGS: Lower chest: Scattered fibrotic lung changes present within the visualized lung bases. Visualized lung bases are otherwise clear. Hepatobiliary: Mild diffuse hepatic steatosis. Liver  otherwise unremarkable. Gallbladder within normal limits. No biliary dilatation. Pancreas: Pancreas somewhat atrophic but without acute inflammatory changes or mass lesion. Spleen: Spleen within normal limits. Adrenals/Urinary Tract: Adrenal glands are normal. Kidneys equal in size with symmetric enhancement. 2.3 cm cysts noted within left kidney. Additional subcentimeter hypodensities too small the characterize, but statistically likely reflects small cysts as well. No nephrolithiasis, hydronephrosis, or focal enhancing renal mass. No hydroureter. Bladder within normal limits. Stomach/Bowel: Small hiatal hernia noted. Diverticulum present at the gastric fundus without associated inflammation. Stomach otherwise unremarkable. No evidence for bowel obstruction. Extensive diverticulosis seen involving the descending and sigmoid colon. Hazy inflammatory stranding about a few diverticula within the sigmoid  colon at the lower mid pelvis, compatible with acute sigmoid diverticulitis (series 2, image 65). No evidence for perforation or other complication. No other acute inflammatory changes seen about the bowels. Vascular/Lymphatic: Advanced atheromatous plaque within the intra-abdominal aorta. No aneurysm. No adenopathy. Reproductive: Uterus and ovaries within normal limits. Other: No free air or fluid. Small fat containing paraumbilical hernia noted. Musculoskeletal: No acute osseous abnormality. No worrisome lytic or blastic osseous lesions. IMPRESSION: 1. Findings consistent with acute sigmoid diverticulitis. No complication identified. 2. Additional chronic findings as above. No other acute abnormality within the abdomen and pelvis. Electronically Signed   By: Jeannine Boga M.D.   On: 05/23/2016 01:47     EKG: Independently reviewed. Sinus rhythm, QTC 472, anteroseptal infarction pattern, nonspecific T-wave change.  Assessment/Plan Principal Problem:   Diverticulitis of colon with bleeding Active  Problems:   UTI (urinary tract infection)   Diabetes mellitus type 2 in obese ([facility])   Sepsis ([place])   ILD (interstitial lung disease) ([place])   Pulmonary fibrosis ([facility])   GERD (gastroesophageal reflux disease)   Hypothyroidism   Depression   Diverticulitis of colon with bleeding and sepsis: CT scan showed acute sigmoid diverticulitis. No abscess. Pt meets criteria for sepsis with leukocytosis, fever and heart rate>90. Lactate level pending. Hemodynamically stable currently.  -will place on tele bed for obs -start Cipro and Flagyl -When necessary Zofran for nausea and morphine for pain -will get Procalcitonin and trend lactic acid levels per sepsis protocol. -IVF: 2.5L of NS bolus in ED, followed by 100 cc/h  -f/u blood culture  UTI (urinary tract infection): -On Cipro and Flagyl as above -Follow-up urine culture  Diet controlled DM-II: Last A1c 7.7 02/20/15, not well controled. Patient is not taking meds at home -SSI  ILD and Pulmonary fibrosis: stable.  -When necessary albuterol nebulizers  GERD: -Protonix  Hypothyroidism: Last TSH was 0.031 on 04/30/14 -Continue home Synthroid -Check TSH  Depression: Stable, no suicidal or homicidal ideations. -Continue home medications: Remeron   DVT ppx: SCD Code Status: Full code Family Communication: Yes, patient's daughter   at bed side Disposition Plan:  Anticipate discharge back to previous home environment Consults called:  none Admission status: Obs / tele   Date of Service 05/23/2016    Ivor Costa [facility] Pager [phone]  If 7PM-7AM, please contact night-coverage [email or web address] Password [email or web address] 05/23/2016, 5:05 AM

## 2016-05-23 NOTE — Plan of Care (Signed)
Problem: Pain Managment: Goal: General experience of comfort will improve Outcome: Progressing c/o of urinary tract pain w/ urination. MD aware. Pyridium ordered.

## 2016-05-23 NOTE — Plan of Care (Signed)
Problem: Physical Regulation: Goal: Will remain free from infection Outcome: Progressing On IV abx for UTI/diverticulitis  Problem: Nutrition: Goal: Adequate nutrition will be maintained Outcome: Not Progressing Pt currently NPO w/ ice chips d/t diverticulitis.

## 2016-05-23 NOTE — ED Notes (Signed)
Patient transported to CT 

## 2016-05-23 NOTE — Evaluation (Signed)
Physical Therapy Evaluation Patient Details Name: Shelia Black MRN: [id number] DOB: 1921-05-31 Today's Date: 05/23/2016   History of Present Illness  83 yo female admitted with diverticulitis, sepsis. Hx of COPD, DM, pulm fibrosis.   Clinical Impression  On eval, pt required Min assist for mobility. She walked ~15 feet in room with a RW. Pt did not wish to ambulate in the hallway on today. Pt presents with general weakness, decreased activity tolerance, and impaired gait and balance. Spoke with daughter over phone-plan is for pt to return home with family. Recommend HHPT.     Follow Up Recommendations Home health PT;Supervision/Assistance - 24 hour    Equipment Recommendations  None recommended by PT (daughter (over phone) stated pt has a walker at home already)    Recommendations for Other Services       Precautions / Restrictions Precautions Precautions: Fall Restrictions Weight Bearing Restrictions: No      Mobility  Bed Mobility Overal bed mobility: Needs Assistance Bed Mobility: Sit to Supine       Sit to supine: Min guard;HOB elevated   General bed mobility comments: increased time.   Transfers Overall transfer level: Needs assistance Equipment used: Rolling walker (2 wheeled) Transfers: Sit to/from Stand Sit to Stand: Min assist         General transfer comment: Assist to rise, stabilize, control descent. VCs safety, hand placement.   Ambulation/Gait Ambulation/Gait assistance: Min assist Ambulation Distance (Feet): 15 Feet Assistive device: Rolling walker (2 wheeled) Gait Pattern/deviations: Step-through pattern;Decreased stride length     General Gait Details: Assist to stabilize pt and maneuver with RW (pt does not use a walker at baseline). Pt fatigues fairly quickly.   Stairs            Wheelchair Mobility    Modified Rankin (Stroke Patients Only)       Balance Overall balance assessment: Needs assistance         Standing  balance support: Bilateral upper extremity supported Standing balance-Leahy Scale: Poor                               Pertinent Vitals/Pain Pain Assessment: No/denies pain    Home Living Family/patient expects to be discharged to:: Private residence Living Arrangements: Children Available Help at Discharge: Family Type of Home: House   Entrance Stairs-Rails: Left Entrance Stairs-Number of Steps: 3 Home Layout: One level Home Equipment: Environmental consultant - 2 wheels      Prior Function Level of Independence: Independent               Hand Dominance        Extremity/Trunk Assessment   Upper Extremity Assessment Upper Extremity Assessment: Generalized weakness    Lower Extremity Assessment Lower Extremity Assessment: Generalized weakness    Cervical / Trunk Assessment Cervical / Trunk Assessment: Normal  Communication   Communication: HOH  Cognition Arousal/Alertness: Awake/alert Behavior During Therapy: WFL for tasks assessed/performed Overall Cognitive Status: Within Functional Limits for tasks assessed                                        General Comments      Exercises     Assessment/Plan    PT Assessment Patient needs continued PT services  PT Problem List Decreased strength;Decreased mobility;Decreased balance;Decreased activity tolerance;Decreased knowledge of use of DME  PT Treatment Interventions DME instruction;Gait training;Therapeutic activities;Therapeutic exercise;Patient/family education;Balance training;Functional mobility training    PT Goals (Current goals can be found in the Care Plan section)  Acute Rehab PT Goals Patient Stated Goal: none stated PT Goal Formulation: With patient Time For Goal Achievement: 06/06/16 Potential to Achieve Goals: Good    Frequency Min 3X/week   Barriers to discharge        Co-evaluation               End of Session   Activity Tolerance: Patient limited by  fatigue Patient left: in bed;with call bell/phone within reach;with bed alarm set   PT Visit Diagnosis: Muscle weakness (generalized) (M62.81);Difficulty in walking, not elsewhere classified (R26.2)    Time: 1610-9604 PT Time Calculation (min) (ACUTE ONLY): 25 min   Charges:   PT Evaluation $PT Eval Low Complexity: 1 Procedure PT Treatments $Gait Training: 8-22 mins   PT G Codes:   PT G-Codes **NOT FOR INPATIENT CLASS** Functional Assessment Tool Used: AM-PAC 6 Clicks Basic Mobility;Clinical judgement Functional Limitation: Mobility: Walking and moving around Mobility: Walking and Moving Around Current Status (V4098): At least 20 percent but less than 40 percent impaired, limited or restricted Mobility: Walking and Moving Around Goal Status [phone]): At least 1 percent but less than 20 percent impaired, limited or restricted      Rebeca Alert, MPT Pager: [phone]

## 2016-05-23 NOTE — Progress Notes (Signed)
Pharmacy Antibiotic Note  Shelia Black is a 83 y.o. female c/o abdominal pain admitted on 05/22/2016 with diverticultis and UTI.  Pharmacy has been consulted for cipro dosing.  Plan: Cipro 400 mg IV q12h Flagy 500 mg IV q8h (MD)  Height:  (162.6 cm) Weight: 170 lb (77.1 kg) IBW/kg (Calculated) : 54.7  Temp (24hrs), Avg:99.2 F (37.3 C), Min:98.1 F (36.7 C), Max:100.8 F (38.2 C)   Recent Labs Lab 05/22/16 2218  WBC 11.8*  CREATININE 0.97    Estimated Creatinine Clearance: 37.2 mL/min (by C-G formula based on SCr of 0.97 mg/dL).    Allergies  Allergen Reactions  . Codeine     Nausea and vomiting  . Percocet [Oxycodone-Acetaminophen] Nausea And Vomiting  . Penicillins Hives and Rash    Has patient had a PCN reaction causing immediate rash, facial/tongue/throat swelling, SOB or lightheadedness with hypotension: no Has patient had a PCN reaction causing severe rash involving mucus membranes or skin necrosis: unknown Has patient had a PCN reaction that required hospitalization : unknown Has patient had a PCN reaction occurring within the last 10 years: no If all of the above answers are "NO", then may proceed with Cephalosporin use.     Antimicrobials this admission: 4/19 cipro >>  4/19 flagyl >>   Dose adjustments this admission:   Microbiology results:  BCx:   UCx:    Sputum:    MRSA PCR:   Thank you for allowing pharmacy to be a part of this patient's care.  Lorenza Evangelist 05/23/2016 3:11 AM

## 2016-05-23 NOTE — Progress Notes (Signed)
[facility]   Patient admitted after midnight see H&P for full details  83 year old female admitted with diverticulitis and UTI, on IV antibiotic. She is seen in the morning with rounding team. Patient complaining of some abdominal pain.  Exam Abdomen: Soft nondistended Left lower quadrant tenderness, positive bowel sounds  Acute diverticulitis Continue IV antibiotics Keep nothing by mouth for now Supportive treatment and pain medication as needed  UTI Already on IV antibiotics Follow-up urine culture Pyridium for dysuria added   Chipper Oman, MD

## 2016-05-24 ENCOUNTER — Inpatient Hospital Stay (HOSPITAL_COMMUNITY): Payer: Medicare Other

## 2016-05-24 DIAGNOSIS — E1169 Type 2 diabetes mellitus with other specified complication: Secondary | ICD-10-CM

## 2016-05-24 DIAGNOSIS — R0602 Shortness of breath: Secondary | ICD-10-CM

## 2016-05-24 DIAGNOSIS — E039 Hypothyroidism, unspecified: Secondary | ICD-10-CM

## 2016-05-24 DIAGNOSIS — N390 Urinary tract infection, site not specified: Secondary | ICD-10-CM

## 2016-05-24 DIAGNOSIS — E669 Obesity, unspecified: Secondary | ICD-10-CM

## 2016-05-24 LAB — BASIC METABOLIC PANEL
ANION GAP: 7 (ref 5–15)
BUN: 8 mg/dL (ref 6–20)
CALCIUM: 8 mg/dL — AB (ref 8.9–10.3)
CO2: 21 mmol/L — AB (ref 22–32)
Chloride: 111 mmol/L (ref 101–111)
Creatinine, Ser: 0.8 mg/dL (ref 0.44–1.00)
GLUCOSE: 100 mg/dL — AB (ref 65–99)
POTASSIUM: 3.2 mmol/L — AB (ref 3.5–5.1)
Sodium: 139 mmol/L (ref 135–145)

## 2016-05-24 LAB — CBC
HCT: 34.2 % — ABNORMAL LOW (ref 36.0–46.0)
HEMOGLOBIN: 11.5 g/dL — AB (ref 12.0–15.0)
MCH: 33.2 pg (ref 26.0–34.0)
MCHC: 33.6 g/dL (ref 30.0–36.0)
MCV: 98.8 fL (ref 78.0–100.0)
Platelets: 185 10*3/uL (ref 150–400)
RBC: 3.46 MIL/uL — ABNORMAL LOW (ref 3.87–5.11)
RDW: 13 % (ref 11.5–15.5)
WBC: 10.9 10*3/uL — AB (ref 4.0–10.5)

## 2016-05-24 LAB — GLUCOSE, CAPILLARY
GLUCOSE-CAPILLARY: 105 mg/dL — AB (ref 65–99)
GLUCOSE-CAPILLARY: 100 mg/dL — AB (ref 65–99)
Glucose-Capillary: 146 mg/dL — ABNORMAL HIGH (ref 65–99)
Glucose-Capillary: 131 mg/dL — ABNORMAL HIGH (ref 65–99)

## 2016-05-24 MED ORDER — POTASSIUM CHLORIDE 10 MEQ/100ML IV SOLN
10.0000 meq | INTRAVENOUS | Status: AC
  Administered 2016-05-24 (×3): 10 meq via INTRAVENOUS
  Filled 2016-05-24 (×3): qty 100

## 2016-05-24 MED ORDER — LEVOTHYROXINE SODIUM 100 MCG PO TABS
100.0000 ug | ORAL_TABLET | Freq: Every day | ORAL | Status: DC
  Administered 2016-05-25 – 2016-05-27 (×3): 100 ug via ORAL
  Filled 2016-05-24 (×3): qty 1

## 2016-05-24 MED ORDER — FUROSEMIDE 10 MG/ML IJ SOLN
40.0000 mg | Freq: Once | INTRAMUSCULAR | Status: AC
  Administered 2016-05-24: 40 mg via INTRAVENOUS
  Filled 2016-05-24: qty 4

## 2016-05-24 NOTE — Progress Notes (Signed)
PROGRESS NOTE [facility]   Shelia Black   HMC:[id number] DOB: [birth year]  DOA: 05/22/2016 PCP: [name], PA-C   Brief Narrative:  83 y/o F with medical hx of DM, COPD, ILS/Pulm fibrosis, hypothyroidism, diverticulosis and GERD, presented to the ED c/o abdominal pain. Patient was found to have diverticulitis on CT and UTI. Patient admitted for further management.   Subjective: Patient seen and examined. Patient having nausea and vomiting this am. Abdominal pain and dysuria  has improved. No BM since admission. Patient denies chest pain and SOB.   Assessment & Plan: Acute diverticulitis - abdominal pain improving  Continue IV antibiotics - cipro and flagyl  Continue NPO, if patient feels better can give trials of clears  Supportive treatment and pain medication as needed  UTI Already on IV antibiotics Follow-up urine culture Pyridium for 2 days   SOB - ? Fluid overload, patient receiving high rate of IVF, UOP ok  Will obtain CXR D/c IVF for now  Will give 40 mg IV lasix   O2 as needed   DM type II - diet controlled  Monitor CBG's  Patient NPO  SSI when able to eat   GERD  PPI   Hypothyroidism  TSH on admission 0.01 T4 elevated 1.85 Synthroid dose decrease  Check TSH level in 6 weeks   DVT prophylaxis: SCD's  Code Status: FULL  Family Communication: Spoke with daughter via phone  Disposition Plan: Home when medically stable   Consultants:   None   Procedures:   None   Antimicrobials: Anti-infectives    Start     Dose/Rate Route Frequency Ordered Stop   05/23/16 1000  metroNIDAZOLE (FLAGYL) IVPB 500 mg     500 mg 100 mL/hr over 60 Minutes Intravenous Every 8 hours 05/23/16 0301     05/23/16 1000  ciprofloxacin (CIPRO) IVPB 400 mg     400 mg 200 mL/hr over 60 Minutes Intravenous Every 12 hours 05/23/16 0313     05/23/16 0230  metroNIDAZOLE (FLAGYL) IVPB 500 mg     500 mg 100 mL/hr over 60 Minutes Intravenous  Once 05/23/16 0223  05/23/16 0356   05/22/16 2345  ciprofloxacin (CIPRO) IVPB 400 mg     400 mg 200 mL/hr over 60 Minutes Intravenous  Once 05/22/16 2342 05/23/16 0142        Objective: Vitals:   05/24/16 0537 05/24/16 1017 05/24/16 1021 05/24/16 1320  BP: 134/62 (!) 142/55  (!) 119/53  Pulse: 88 95  72  Resp:  16  14  Temp: 98.7 F (37.1 C) 98.8 F (37.1 C)  98.1 F (36.7 C)  TempSrc: Oral Oral  Oral  SpO2: 92% (!) 86% 92% 96%  Weight:      Height:        Intake/Output Summary (Last 24 hours) at 05/24/16 1542 Last data filed at 05/24/16 1500  Gross per 24 hour  Intake             3670 ml  Output              800 ml  Net             2870 ml   Filed Weights   05/22/16 2135 05/23/16 0346  Weight: 77.1 kg (170 lb) 72.6 kg (160 lb 0.9 oz)    Examination:  General exam: Mild distress  Respiratory system: Good air entry, crackles at the bases R > L  Cardiovascular system: S1 & S2 heard, RRR. No JVD, murmurs, rubs  or gallops Gastrointestinal system: Abdomen is nondistended, soft and nontender.  Central nervous system: Alert and oriented. Extremities: Trace pedal edema.  Skin: No rashes, lesions or ulcers Psychiatry: Judgement and insight appear normal. Mood & affect appropriate.    Data Reviewed: I have personally reviewed following labs and imaging studies  CBC:  Recent Labs Lab 05/22/16 2218 05/23/16 0531 05/24/16 0515  WBC 11.8* 12.6* 10.9*  HGB 14.4 12.7 11.5*  HCT 42.4 38.5 34.2*  MCV 98.4 99.2 98.8  PLT 218 193 809   Basic Metabolic Panel:  Recent Labs Lab 05/22/16 2218 05/23/16 0531 05/24/16 0515  NA 138 140 139  K 3.8 3.6 3.2*  CL 104 108 111  CO2 26 21* 21*  GLUCOSE 152* 120* 100*  BUN 11 9 8   CREATININE 0.97 0.81 0.80  CALCIUM 9.2 8.4* 8.0*   GFR: Estimated Creatinine Clearance: 43.8 mL/min (by C-G formula based on SCr of 0.8 mg/dL). Liver Function Tests:  Recent Labs Lab 05/22/16 2218  AST 18  ALT 10*  ALKPHOS 61  BILITOT 1.1  PROT 7.6    ALBUMIN 3.9    Recent Labs Lab 05/22/16 2218  LIPASE 20   No results for input(s): AMMONIA in the last 168 hours. Coagulation Profile:  Recent Labs Lab 05/23/16 0531  INR 1.16   Cardiac Enzymes: No results for input(s): CKTOTAL, CKMB, CKMBINDEX, TROPONINI in the last 168 hours. BNP (last 3 results) No results for input(s): PROBNP in the last 8760 hours. HbA1C: No results for input(s): HGBA1C in the last 72 hours. CBG:  Recent Labs Lab 05/23/16 1158 05/23/16 1702 05/23/16 2112 05/24/16 0800 05/24/16 1151  GLUCAP 150* 120* 103* 105* 146*   Lipid Profile: No results for input(s): CHOL, HDL, LDLCALC, TRIG, CHOLHDL, LDLDIRECT in the last 72 hours. Thyroid Function Tests:  Recent Labs  05/23/16 0531 05/23/16 0556  TSH <0.010*  --   FREET4  --  1.85*   Anemia Panel: No results for input(s): VITAMINB12, FOLATE, FERRITIN, TIBC, IRON, RETICCTPCT in the last 72 hours. Sepsis Labs:  Recent Labs Lab 05/23/16 0531  PROCALCITON 0.20  LATICACIDVEN 1.5    Recent Results (from the past 240 hour(s))  Urine culture     Status: Abnormal (Preliminary result)   Collection Time: 05/22/16 10:04 PM  Result Value Ref Range Status   Specimen Description URINE, RANDOM  Final   Special Requests NONE  Final   Culture (A)  Final    >=100,000 COLONIES/mL ESCHERICHIA COLI SUSCEPTIBILITIES TO FOLLOW Performed at [facility], [address] [place], [place] [address]    Report Status PENDING  Incomplete     Radiology Studies: Ct Abdomen Pelvis W Contrast  Result Date: 05/23/2016 CLINICAL DATA:  Initial evaluation for acute abdominal pain. Blood in stool. History of diverticulitis. EXAM: CT ABDOMEN AND PELVIS WITH CONTRAST TECHNIQUE: Multidetector CT imaging of the abdomen and pelvis was performed using the standard protocol following bolus administration of intravenous contrast. CONTRAST:  100 cc of Isovue-300. COMPARISON:  None available. FINDINGS: Lower chest:  Scattered fibrotic lung changes present within the visualized lung bases. Visualized lung bases are otherwise clear. Hepatobiliary: Mild diffuse hepatic steatosis. Liver otherwise unremarkable. Gallbladder within normal limits. No biliary dilatation. Pancreas: Pancreas somewhat atrophic but without acute inflammatory changes or mass lesion. Spleen: Spleen within normal limits. Adrenals/Urinary Tract: Adrenal glands are normal. Kidneys equal in size with symmetric enhancement. 2.3 cm cysts noted within left kidney. Additional subcentimeter hypodensities too small the characterize, but statistically likely reflects  small cysts as well. No nephrolithiasis, hydronephrosis, or focal enhancing renal mass. No hydroureter. Bladder within normal limits. Stomach/Bowel: Small hiatal hernia noted. Diverticulum present at the gastric fundus without associated inflammation. Stomach otherwise unremarkable. No evidence for bowel obstruction. Extensive diverticulosis seen involving the descending and sigmoid colon. Hazy inflammatory stranding about a few diverticula within the sigmoid colon at the lower mid pelvis, compatible with acute sigmoid diverticulitis (series 2, image 65). No evidence for perforation or other complication. No other acute inflammatory changes seen about the bowels. Vascular/Lymphatic: Advanced atheromatous plaque within the intra-abdominal aorta. No aneurysm. No adenopathy. Reproductive: Uterus and ovaries within normal limits. Other: No free air or fluid. Small fat containing paraumbilical hernia noted. Musculoskeletal: No acute osseous abnormality. No worrisome lytic or blastic osseous lesions. IMPRESSION: 1. Findings consistent with acute sigmoid diverticulitis. No complication identified. 2. Additional chronic findings as above. No other acute abnormality within the abdomen and pelvis. Electronically Signed   By: Jeannine Boga M.D.   On: 05/23/2016 01:47    Scheduled Meds: . acidophilus  1  capsule Oral Daily  . cholecalciferol  1,000 Units Oral Daily  . insulin aspart  0-5 Units Subcutaneous QHS  . insulin aspart  0-9 Units Subcutaneous TID WC  . [START ON 05/25/2016] levothyroxine  100 mcg Oral QAC breakfast  . mirtazapine  15 mg Oral QHS  . pantoprazole  40 mg Oral Daily  . phenazopyridine  200 mg Oral TID WC  . sodium chloride flush  3 mL Intravenous Q12H  . vitamin C  500 mg Oral Daily   Continuous Infusions: . sodium chloride 100 mL/hr at 05/23/16 1846  . ciprofloxacin Stopped (05/24/16 1050)  . metronidazole Stopped (05/24/16 0944)     LOS: 1 day   Chipper Oman, MD Pager: Text Page via [email or web address]  [phone]  If 7PM-7AM, please contact night-coverage [email or web address] Password [facility] 05/24/2016, 3:42 PM

## 2016-05-24 NOTE — Progress Notes (Signed)
PT Cancellation Note  Patient Details Name: Marlana Mckowen MRN: [id number] DOB: [birth year]   Cancelled Treatment:     Pt not feeling well this morning.  RN reported same.  Will check back later as schedule permits.  Pt has been evaluated with rec for [facility] with 24/7 care   Felecia Shelling  PTA [facility]  Acute  Rehab Pager      [phone]

## 2016-05-24 NOTE — Care Management Note (Signed)
Case Management Note  Patient Details  Name: Shelia Black MRN: [id number] Date of Birth: [birth year]  Subjective/Objective:  83 y/o f admitted w/diverticulitis. From out of town but patient will stay w daughter-Shelia Black:19 Engineer, building [place]. [place] [id number], c#[phone]. pcp-Elizabeth Barnes-[address]-spoke to dtr about d/c plans-HHC-recc HHPT/RN-she will discuss w/patient whether they will accept HHC, & choose. Left HHC agency list in rm-await choice. Patient already has a RW. Noted on 02-will monitor.                  Action/Plan:d/c home w/HHC.   Expected Discharge Date:                  Expected Discharge Plan:  Home w Home Health Services  In-House Referral:     Discharge planning Services  CM Consult  Post Acute Care Choice:    Choice offered to:  Adult Children  DME Arranged:    DME Agency:     HH Arranged:    HH Agency:     Status of Service:  In process, will continue to follow  If discussed at Long Length of Stay Meetings, dates discussed:    Additional Comments:  Lanier Clam, RN 05/24/2016, 2:27 PM

## 2016-05-25 DIAGNOSIS — B9620 Unspecified Escherichia coli [E. coli] as the cause of diseases classified elsewhere: Secondary | ICD-10-CM

## 2016-05-25 LAB — BASIC METABOLIC PANEL
Anion gap: 8 (ref 5–15)
BUN: 7 mg/dL (ref 6–20)
CALCIUM: 8.3 mg/dL — AB (ref 8.9–10.3)
CHLORIDE: 107 mmol/L (ref 101–111)
CO2: 24 mmol/L (ref 22–32)
CREATININE: 0.84 mg/dL (ref 0.44–1.00)
GFR calc Af Amer: >60 mL/min (ref >60)
GFR calc non Af Amer: 59 mL/min — ABNORMAL LOW (ref >60)
GLUCOSE: 116 mg/dL — AB (ref 65–99)
Potassium: 3.2 mmol/L — ABNORMAL LOW (ref 3.5–5.1)
Sodium: 139 mmol/L (ref 135–145)

## 2016-05-25 LAB — GLUCOSE, CAPILLARY
GLUCOSE-CAPILLARY: 129 mg/dL — AB (ref 65–99)
GLUCOSE-CAPILLARY: 139 mg/dL — AB (ref 65–99)
Glucose-Capillary: 159 mg/dL — ABNORMAL HIGH (ref 65–99)
Glucose-Capillary: 111 mg/dL — ABNORMAL HIGH (ref 65–99)

## 2016-05-25 LAB — CBC
HCT: 35.6 % — ABNORMAL LOW (ref 36.0–46.0)
Hemoglobin: 11.6 g/dL — ABNORMAL LOW (ref 12.0–15.0)
MCH: 32.4 pg (ref 26.0–34.0)
MCHC: 32.6 g/dL (ref 30.0–36.0)
MCV: 99.4 fL (ref 78.0–100.0)
PLATELETS: 195 10*3/uL (ref 150–400)
RBC: 3.58 MIL/uL — AB (ref 3.87–5.11)
RDW: 12.8 % (ref 11.5–15.5)
WBC: 9.3 10*3/uL (ref 4.0–10.5)

## 2016-05-25 MED ORDER — METRONIDAZOLE 500 MG PO TABS
500.0000 mg | ORAL_TABLET | Freq: Three times a day (TID) | ORAL | Status: DC
  Administered 2016-05-25 – 2016-05-27 (×7): 500 mg via ORAL
  Filled 2016-05-25 (×7): qty 1

## 2016-05-25 MED ORDER — POTASSIUM CHLORIDE CRYS ER 20 MEQ PO TBCR
40.0000 meq | EXTENDED_RELEASE_TABLET | ORAL | Status: AC
  Administered 2016-05-25 (×2): 40 meq via ORAL
  Filled 2016-05-25 (×2): qty 2

## 2016-05-25 MED ORDER — CIPROFLOXACIN HCL 500 MG PO TABS
500.0000 mg | ORAL_TABLET | Freq: Two times a day (BID) | ORAL | Status: DC
  Administered 2016-05-25 – 2016-05-27 (×4): 500 mg via ORAL
  Filled 2016-05-25 (×4): qty 1

## 2016-05-25 MED ORDER — ONDANSETRON 4 MG PO TBDP
8.0000 mg | ORAL_TABLET | Freq: Three times a day (TID) | ORAL | Status: DC | PRN
  Administered 2016-05-26: 8 mg via ORAL
  Filled 2016-05-25: qty 2

## 2016-05-25 MED ORDER — POLYETHYLENE GLYCOL 3350 17 G PO PACK: 17.0000 g | PACK | Freq: Every day | ORAL | Status: DC | PRN

## 2016-05-25 MED ORDER — NAPROXEN SODIUM 550 MG PO TABS
550.0000 mg | ORAL_TABLET | Freq: Two times a day (BID) | ORAL | Status: DC | PRN
Start: 2016-05-25 — End: 2016-05-26
  Filled 2016-05-25 (×2): qty 1

## 2016-05-25 NOTE — Progress Notes (Signed)
PROGRESS NOTE [facility]   Shelia Black   DJS:[id number] DOB: 27-Apr-1921  DOA: 05/22/2016 PCP: [name], PA-C   Brief Narrative:  83 y/o F with medical hx of DM, COPD, ILS/Pulm fibrosis, hypothyroidism, diverticulosis and GERD, presented to the ED c/o abdominal pain. Patient was found to have diverticulitis on CT and UTI. Patient admitted for further management.   Subjective: Patient seen and examined, have no complaints this am. Patient haven't vomited since yesterday morning, tolerated clear liquids this AM. Dysuria has improved. SOB has resolved and patient is off O2, after a dose of Lasix.   Assessment & Plan: Acute diverticulitis - Improved, Continue IV antibiotics - cipro and flagyl  Tolerated clear liquids this AM, advance as tolerated if continues to improve will d/c in AM  Supportive treatment and pain medication as needed  UTI - E. Coli  E coli sensitive to cipro - will continue  Follow-up urine culture Pyridium for 2 days   SOB  Due to fluid over load from aggressive hydration, UOP ok - resolved  IVF D/ced   Lasix 40 mg OTO  O2 as needed   DM type II - diet controlled  Monitor CBG's  Patient NPO  SSI when able to eat   GERD  PPI   Hypothyroidism  TSH on admission 0.01 T4 elevated 1.85 Synthroid dose decrease  Check TSH level in 6 weeks   DVT prophylaxis: SCD's  Code Status: FULL  Family Communication: Spoke with daughter via phone  Disposition Plan: Home when medically stable   Consultants:   None   Procedures:   None   Antimicrobials: Anti-infectives    Start     Dose/Rate Route Frequency Ordered Stop   05/23/16 1000  metroNIDAZOLE (FLAGYL) IVPB 500 mg     500 mg 100 mL/hr over 60 Minutes Intravenous Every 8 hours 05/23/16 0301     05/23/16 1000  ciprofloxacin (CIPRO) IVPB 400 mg     400 mg 200 mL/hr over 60 Minutes Intravenous Every 12 hours 05/23/16 0313     05/23/16 0230  metroNIDAZOLE (FLAGYL) IVPB 500 mg     500 mg 100 mL/hr over 60 Minutes Intravenous  Once 05/23/16 0223 05/23/16 0356   05/22/16 2345  ciprofloxacin (CIPRO) IVPB 400 mg     400 mg 200 mL/hr over 60 Minutes Intravenous  Once 05/22/16 2342 05/23/16 0142      Objective: Vitals:   05/24/16 2100 05/25/16 0533 05/25/16 0941 05/25/16 1429  BP: 120/65 122/67 (!) 125/51 108/87  Pulse: 75 76  71  Resp: 18 18  18   Temp: 98.7 F (37.1 C) 97 F (36.1 C)  98 F (36.7 C)  TempSrc: Oral Oral  Oral  SpO2: 97% 96% 91% 96%  Weight:      Height:        Intake/Output Summary (Last 24 hours) at 05/25/16 1450 Last data filed at 05/25/16 0700  Gross per 24 hour  Intake             2260 ml  Output             2100 ml  Net              160 ml   Filed Weights   05/22/16 2135 05/23/16 0346  Weight: 77.1 kg (170 lb) 72.6 kg (160 lb 0.9 oz)    Examination:  General exam: NAD, calm  Respiratory system: CTA  Cardiovascular system: S1S2 no murmurs Gastrointestinal system: Abdomen soft   Central  nervous system: Non focal  Extremities: No pedal edema Skin: No lesions  Psychiatry: Mood appropriate   Data Reviewed: I have personally reviewed following labs and imaging studies  CBC:  Recent Labs Lab 05/22/16 2218 05/23/16 0531 05/24/16 0515 05/25/16 0515  WBC 11.8* 12.6* 10.9* 9.3  HGB 14.4 12.7 11.5* 11.6*  HCT 42.4 38.5 34.2* 35.6*  MCV 98.4 99.2 98.8 99.4  PLT 218 193 185 235   Basic Metabolic Panel:  Recent Labs Lab 05/22/16 2218 05/23/16 0531 05/24/16 0515 05/25/16 0515  NA 138 140 139 139  K 3.8 3.6 3.2* 3.2*  CL 104 108 111 107  CO2 26 21* 21* 24  GLUCOSE 152* 120* 100* 116*  BUN 11 9 8 7   CREATININE 0.97 0.81 0.80 0.84  CALCIUM 9.2 8.4* 8.0* 8.3*   GFR: Estimated Creatinine Clearance: 41.8 mL/min (by C-G formula based on SCr of 0.84 mg/dL). Liver Function Tests:  Recent Labs Lab 05/22/16 2218  AST 18  ALT 10*  ALKPHOS 61  BILITOT 1.1  PROT 7.6  ALBUMIN 3.9    Recent Labs Lab 05/22/16 2218   LIPASE 20   Coagulation Profile:  Recent Labs Lab 05/23/16 0531  INR 1.16   CBG:  Recent Labs Lab 05/24/16 1151 05/24/16 1647 05/24/16 2039 05/25/16 0735 05/25/16 1220  GLUCAP 146* 100* 131* 129* 159*   Thyroid Function Tests:  Recent Labs  05/23/16 0531 05/23/16 0556  TSH <0.010*  --   FREET4  --  1.85*   Sepsis Labs:  Recent Labs Lab 05/23/16 0531  PROCALCITON 0.20  LATICACIDVEN 1.5    Recent Results (from the past 240 hour(s))  Urine culture     Status: Abnormal (Preliminary result)   Collection Time: 05/22/16 10:04 PM  Result Value Ref Range Status   Specimen Description URINE, RANDOM  Final   Special Requests NONE  Final   Culture (A)  Final    >=100,000 COLONIES/mL ESCHERICHIA COLI CULTURE REINCUBATED FOR BETTER GROWTH Performed at [facility], [place] [address] [place], [place] [address]    Report Status PENDING  Incomplete   Organism ID, Bacteria ESCHERICHIA COLI (A)  Final      Susceptibility   Escherichia coli - MIC*    AMPICILLIN >=32 RESISTANT Resistant     CEFAZOLIN <=4 SENSITIVE Sensitive     CEFTRIAXONE <=1 SENSITIVE Sensitive     CIPROFLOXACIN 0.5 SENSITIVE Sensitive     GENTAMICIN <=1 SENSITIVE Sensitive     IMIPENEM <=0.25 SENSITIVE Sensitive     NITROFURANTOIN <=16 SENSITIVE Sensitive     TRIMETH/SULFA <=20 SENSITIVE Sensitive     AMPICILLIN/SULBACTAM >=32 RESISTANT Resistant     PIP/TAZO <=4 SENSITIVE Sensitive     Extended ESBL NEGATIVE Sensitive     * >=100,000 COLONIES/mL ESCHERICHIA COLI  Blood culture (routine x 2)     Status: None (Preliminary result)   Collection Time: 05/22/16 11:36 PM  Result Value Ref Range Status   Specimen Description BLOOD RIGHT ANTECUBITAL  Final   Special Requests   Final    BOTTLES DRAWN AEROBIC AND ANAEROBIC Blood Culture adequate volume   Culture   Final    NO GROWTH 2 DAYS Performed at [facility], [place] [address] [place], [place] [address]    Report Status PENDING   Incomplete  Blood culture (routine x 2)     Status: None (Preliminary result)   Collection Time: 05/22/16 11:53 PM  Result Value Ref Range Status   Specimen Description BLOOD  BLOOD RIGHT FOREARM  Final   Special Requests   Final    BOTTLES DRAWN AEROBIC AND ANAEROBIC Blood Culture adequate volume   Culture   Final    NO GROWTH 2 DAYS Performed at [facility], [address], [place], [place] [address]    Report Status PENDING  Incomplete     Radiology Studies: Dg Chest Port 1 View  Result Date: 05/24/2016 CLINICAL DATA:  Shortness of breath.  Chronic cough. EXAM: PORTABLE CHEST 1 VIEW COMPARISON:  02/21/2015. FINDINGS: Chronic cardiomegaly. Aortic atherosclerosis. Chronic abnormal interstitial lung markings consistent with fibrosis. No evidence of infiltrate, collapse or effusion presently. No acute bone finding. IMPRESSION: Cardiomegaly. Aortic atherosclerosis. Chronic fibrotic lung density. No acute process evident. Electronically Signed   By: Nelson Chimes M.D.   On: 05/24/2016 16:27    Scheduled Meds: . acidophilus  1 capsule Oral Daily  . cholecalciferol  1,000 Units Oral Daily  . insulin aspart  0-5 Units Subcutaneous QHS  . insulin aspart  0-9 Units Subcutaneous TID WC  . levothyroxine  100 mcg Oral QAC breakfast  . mirtazapine  15 mg Oral QHS  . pantoprazole  40 mg Oral Daily  . sodium chloride flush  3 mL Intravenous Q12H  . vitamin C  500 mg Oral Daily   Continuous Infusions: . ciprofloxacin Stopped (05/25/16 1038)  . metronidazole Stopped (05/25/16 1038)     LOS: 2 days   Chipper Oman, MD Pager: Text Page via [email or web address]  [phone]  If 7PM-7AM, please contact night-coverage [email or web address] Password [email or web address] 05/25/2016, 2:50 PM

## 2016-05-26 ENCOUNTER — Inpatient Hospital Stay (HOSPITAL_COMMUNITY): Payer: Medicare Other

## 2016-05-26 DIAGNOSIS — K5732 Diverticulitis of large intestine without perforation or abscess without bleeding: Secondary | ICD-10-CM

## 2016-05-26 DIAGNOSIS — R51 Headache: Secondary | ICD-10-CM

## 2016-05-26 DIAGNOSIS — K5792 Diverticulitis of intestine, part unspecified, without perforation or abscess without bleeding: Secondary | ICD-10-CM

## 2016-05-26 LAB — GLUCOSE, CAPILLARY
GLUCOSE-CAPILLARY: 130 mg/dL — AB (ref 65–99)
GLUCOSE-CAPILLARY: 150 mg/dL — AB (ref 65–99)
GLUCOSE-CAPILLARY: 142 mg/dL — AB (ref 65–99)
Glucose-Capillary: 134 mg/dL — ABNORMAL HIGH (ref 65–99)

## 2016-05-26 LAB — BASIC METABOLIC PANEL
ANION GAP: 7 (ref 5–15)
BUN: 8 mg/dL (ref 6–20)
CALCIUM: 8.3 mg/dL — AB (ref 8.9–10.3)
CO2: 25 mmol/L (ref 22–32)
Chloride: 107 mmol/L (ref 101–111)
Creatinine, Ser: 0.86 mg/dL (ref 0.44–1.00)
GFR, EST NON AFRICAN AMERICAN: 57 mL/min — AB (ref >60)
Glucose, Bld: 142 mg/dL — ABNORMAL HIGH (ref 65–99)
POTASSIUM: 4 mmol/L (ref 3.5–5.1)
SODIUM: 139 mmol/L (ref 135–145)

## 2016-05-26 MED ORDER — IBUPROFEN 200 MG PO TABS
600.0000 mg | ORAL_TABLET | Freq: Four times a day (QID) | ORAL | Status: DC | PRN
  Administered 2016-05-26: 600 mg via ORAL
  Filled 2016-05-26 (×2): qty 3

## 2016-05-26 MED ORDER — AMMONIUM LACTATE 5 % EX LOTN
TOPICAL_LOTION | CUTANEOUS | Status: DC | PRN
  Filled 2016-05-26: qty 222

## 2016-05-26 MED ORDER — AMMONIUM LACTATE 12 % EX LOTN
TOPICAL_LOTION | CUTANEOUS | Status: DC | PRN
  Administered 2016-05-26: 13:00:00 via TOPICAL
  Filled 2016-05-26: qty 400

## 2016-05-26 MED ORDER — TRAMADOL HCL 50 MG PO TABS
50.0000 mg | ORAL_TABLET | Freq: Four times a day (QID) | ORAL | Status: DC | PRN
  Administered 2016-05-26: 50 mg via ORAL
  Filled 2016-05-26: qty 1

## 2016-05-26 NOTE — Progress Notes (Signed)
PROGRESS NOTE [facility]   Virna Livengood   SKA:[id number] DOB: 1921/10/16  DOA: 05/22/2016 PCP: [name], PA-C   Brief Narrative:  83 y/o F with medical hx of DM, COPD, ILD/Pulm fibrosis, hypothyroidism, diverticulosis and GERD, presented to the ED c/o abdominal pain. Patient was found to have diverticulitis on CT and UTI. Patient admitted for further management.   Subjective: Patient seen and examined, patient today c/o of intense headache pn and off since admission, one sided radiates to the left eye and travels to the ear, associated with nausea, improve with rest, pain med give mild relief. Patient denies blurry vision and dizziness. No other complaints. No acute events overnight   Assessment & Plan: Acute diverticulitis - Improved, Continue IV antibiotics - cipro and flagyl - switched to PO abx given patient loss IV access  Tolerating diet well  Supportive treatment and pain medication as needed Diet education given and discussed with daughter   UTI - E. Coli  E coli sensitive to cipro - will continue  Follow-up urine culture Treated Pyridium x 2 days  Headaches - seem to be migraine, unilateral throbbing pain, associated with nausea, and improve with rest  Ibuprofen and tylenol PRN  CT head was performed - no acute findings   SOB  Due to fluid over load from aggressive hydration, UOP ok - resolved Patient with know hx of ILD/Pulm fibrosis  IVF D/ced   Lasix 40 mg OTO  O2 as needed   DM type II - diet controlled at home  Monitor CBG's  Continue ISS Monitor CBG's  GERD  PPI   Hypothyroidism  TSH on admission 0.01 T4 elevated 1.85 Synthroid dose decrease  Check TSH level in 6 weeks   DVT prophylaxis: SCD's  Code Status: FULL  Family Communication: Spoke with daughter via phone  Disposition Plan: Home when medically stable   Consultants:   None   Procedures:   None   Antimicrobials: Anti-infectives    Start     Dose/Rate Route  Frequency Ordered Stop   05/25/16 2000  ciprofloxacin (CIPRO) tablet 500 mg     500 mg Oral 2 times daily 05/25/16 1617     05/25/16 1615  metroNIDAZOLE (FLAGYL) tablet 500 mg     500 mg Oral 3 times daily 05/25/16 1610     05/23/16 1000  metroNIDAZOLE (FLAGYL) IVPB 500 mg  Status:  Discontinued     500 mg 100 mL/hr over 60 Minutes Intravenous Every 8 hours 05/23/16 0301 05/25/16 1610   05/23/16 1000  ciprofloxacin (CIPRO) IVPB 400 mg  Status:  Discontinued     400 mg 200 mL/hr over 60 Minutes Intravenous Every 12 hours 05/23/16 0313 05/25/16 1617   05/23/16 0230  metroNIDAZOLE (FLAGYL) IVPB 500 mg     500 mg 100 mL/hr over 60 Minutes Intravenous  Once 05/23/16 0223 05/23/16 0356   05/22/16 2345  ciprofloxacin (CIPRO) IVPB 400 mg     400 mg 200 mL/hr over 60 Minutes Intravenous  Once 05/22/16 2342 05/23/16 0142      Objective: Vitals:   05/25/16 1429 05/25/16 2218 05/26/16 0613 05/26/16 1340  BP: 108/87 (!) 124/50 (!) 114/41 127/63  Pulse: 71 86 80 83  Resp: 18 20 18 17   Temp: 98 F (36.7 C) 97.9 F (36.6 C) 98 F (36.7 C) 98.5 F (36.9 C)  TempSrc: Oral Oral Axillary Oral  SpO2: 96% 95% 99% 92%  Weight:      Height:  Intake/Output Summary (Last 24 hours) at 05/26/16 1511 Last data filed at 05/26/16 1100  Gross per 24 hour  Intake              600 ml  Output              100 ml  Net              500 ml   Filed Weights   05/22/16 2135 05/23/16 0346  Weight: 77.1 kg (170 lb) 72.6 kg (160 lb 0.9 oz)    Examination:  General exam: Mild distress due to headaches   HEENT: TM intact, non erythematous. Auditory canal with mild wax.  Respiratory system: Clear to auscultation  Cardiovascular system: RRR no m/r/g Gastrointestinal system: Abdomen soft non tender   Central nervous system: Alert orient, non focal  Extremities: No LE edema  Skin: Multiple ecchymosis, dry skin  Psychiatry: Mood sad  Data Reviewed: I have personally reviewed following labs and  imaging studies  CBC:  Recent Labs Lab 05/22/16 2218 05/23/16 0531 05/24/16 0515 05/25/16 0515  WBC 11.8* 12.6* 10.9* 9.3  HGB 14.4 12.7 11.5* 11.6*  HCT 42.4 38.5 34.2* 35.6*  MCV 98.4 99.2 98.8 99.4  PLT 218 193 185 824   Basic Metabolic Panel:  Recent Labs Lab 05/22/16 2218 05/23/16 0531 05/24/16 0515 05/25/16 0515 05/26/16 0829  NA 138 140 139 139 139  K 3.8 3.6 3.2* 3.2* 4.0  CL 104 108 111 107 107  CO2 26 21* 21* 24 25  GLUCOSE 152* 120* 100* 116* 142*  BUN 11 9 8 7 8   CREATININE 0.97 0.81 0.80 0.84 0.86  CALCIUM 9.2 8.4* 8.0* 8.3* 8.3*   GFR: Estimated Creatinine Clearance: 40.8 mL/min (by C-G formula based on SCr of 0.86 mg/dL). Liver Function Tests:  Recent Labs Lab 05/22/16 2218  AST 18  ALT 10*  ALKPHOS 61  BILITOT 1.1  PROT 7.6  ALBUMIN 3.9    Recent Labs Lab 05/22/16 2218  LIPASE 20   Coagulation Profile:  Recent Labs Lab 05/23/16 0531  INR 1.16   CBG:  Recent Labs Lab 05/25/16 1220 05/25/16 1633 05/25/16 2152 05/26/16 0738 05/26/16 1158  GLUCAP 159* 111* 139* 130* 150*   Thyroid Function Tests: No results for input(s): TSH, T4TOTAL, FREET4, T3FREE, THYROIDAB in the last 72 hours. Sepsis Labs:  Recent Labs Lab 05/23/16 0531  PROCALCITON 0.20  LATICACIDVEN 1.5    Recent Results (from the past 240 hour(s))  Urine culture     Status: Abnormal (Preliminary result)   Collection Time: 05/22/16 10:04 PM  Result Value Ref Range Status   Specimen Description URINE, RANDOM  Final   Special Requests NONE  Final   Culture (A)  Final    >=100,000 COLONIES/mL ESCHERICHIA COLI 60,000 COLONIES/mL PSEUDOMONAS AERUGINOSA SUSCEPTIBILITIES TO FOLLOW Performed at [facility], [address] [place], [place] [address]    Report Status PENDING  Incomplete   Organism ID, Bacteria ESCHERICHIA COLI (A)  Final      Susceptibility   Escherichia coli - MIC*    AMPICILLIN >=32 RESISTANT Resistant     CEFAZOLIN <=4 SENSITIVE  Sensitive     CEFTRIAXONE <=1 SENSITIVE Sensitive     CIPROFLOXACIN 0.5 SENSITIVE Sensitive     GENTAMICIN <=1 SENSITIVE Sensitive     IMIPENEM <=0.25 SENSITIVE Sensitive     NITROFURANTOIN <=16 SENSITIVE Sensitive     TRIMETH/SULFA <=20 SENSITIVE Sensitive     AMPICILLIN/SULBACTAM >=32 RESISTANT Resistant  PIP/TAZO <=4 SENSITIVE Sensitive     Extended ESBL NEGATIVE Sensitive     * >=100,000 COLONIES/mL ESCHERICHIA COLI  Blood culture (routine x 2)     Status: None (Preliminary result)   Collection Time: 05/22/16 11:36 PM  Result Value Ref Range Status   Specimen Description BLOOD RIGHT ANTECUBITAL  Final   Special Requests   Final    BOTTLES DRAWN AEROBIC AND ANAEROBIC Blood Culture adequate volume   Culture   Final    NO GROWTH 3 DAYS Performed at [facility], [place] [address] [place], [place] [address]    Report Status PENDING  Incomplete  Blood culture (routine x 2)     Status: None (Preliminary result)   Collection Time: 05/22/16 11:53 PM  Result Value Ref Range Status   Specimen Description BLOOD BLOOD RIGHT FOREARM  Final   Special Requests   Final    BOTTLES DRAWN AEROBIC AND ANAEROBIC Blood Culture adequate volume   Culture   Final    NO GROWTH 3 DAYS Performed at [facility], [place] [address], [place], [place] [address]    Report Status PENDING  Incomplete     Radiology Studies: Ct Head Wo Contrast  Result Date: 05/26/2016 CLINICAL DATA:  Headache and photophobia EXAM: CT HEAD WITHOUT CONTRAST TECHNIQUE: Contiguous axial images were obtained from the base of the skull through the vertex without intravenous contrast. COMPARISON:  None. FINDINGS: Brain: No mass lesion, intraparenchymal hemorrhage or extra-axial collection. No evidence of acute cortical infarct. Brain parenchyma and CSF-containing spaces are normal for age. Vascular: No hyperdense vessel or unexpected calcification. Skull: Normal visualized skull base, calvarium and extracranial soft  tissues. Sinuses/Orbits: No sinus fluid levels or advanced mucosal thickening. No mastoid effusion. Normal orbits. IMPRESSION: Normal head CT. Electronically Signed   By: Ulyses Jarred M.D.   On: 05/26/2016 14:46   Dg Chest Port 1 View  Result Date: 05/24/2016 CLINICAL DATA:  Shortness of breath.  Chronic cough. EXAM: PORTABLE CHEST 1 VIEW COMPARISON:  02/21/2015. FINDINGS: Chronic cardiomegaly. Aortic atherosclerosis. Chronic abnormal interstitial lung markings consistent with fibrosis. No evidence of infiltrate, collapse or effusion presently. No acute bone finding. IMPRESSION: Cardiomegaly. Aortic atherosclerosis. Chronic fibrotic lung density. No acute process evident. Electronically Signed   By: Nelson Chimes M.D.   On: 05/24/2016 16:27    Scheduled Meds: . acidophilus  1 capsule Oral Daily  . cholecalciferol  1,000 Units Oral Daily  . ciprofloxacin  500 mg Oral BID  . insulin aspart  0-5 Units Subcutaneous QHS  . insulin aspart  0-9 Units Subcutaneous TID WC  . levothyroxine  100 mcg Oral QAC breakfast  . metroNIDAZOLE  500 mg Oral TID  . mirtazapine  15 mg Oral QHS  . pantoprazole  40 mg Oral Daily  . sodium chloride flush  3 mL Intravenous Q12H  . vitamin C  500 mg Oral Daily   Continuous Infusions:    LOS: 3 days   Chipper Oman, MD Pager: Text Page via [email or web address]  [phone]  If 7PM-7AM, please contact night-coverage [email or web address] Password [email or web address] 05/26/2016, 3:11 PM

## 2016-05-27 DIAGNOSIS — A4151 Sepsis due to Escherichia coli [E. coli]: Secondary | ICD-10-CM

## 2016-05-27 DIAGNOSIS — B965 Pseudomonas (aeruginosa) (mallei) (pseudomallei) as the cause of diseases classified elsewhere: Secondary | ICD-10-CM

## 2016-05-27 DIAGNOSIS — A419 Sepsis, unspecified organism: Principal | ICD-10-CM

## 2016-05-27 DIAGNOSIS — G43809 Other migraine, not intractable, without status migrainosus: Secondary | ICD-10-CM

## 2016-05-27 DIAGNOSIS — J8410 Pulmonary fibrosis, unspecified: Secondary | ICD-10-CM

## 2016-05-27 LAB — GLUCOSE, CAPILLARY
GLUCOSE-CAPILLARY: 118 mg/dL — AB (ref 65–99)
Glucose-Capillary: 171 mg/dL — ABNORMAL HIGH (ref 65–99)

## 2016-05-27 LAB — URINE CULTURE: Culture: >=100000 — AB

## 2016-05-27 MED ORDER — MIRTAZAPINE 15 MG PO TABS: 30.0000 mg | ORAL_TABLET | Freq: Every day | ORAL | Status: DC

## 2016-05-27 MED ORDER — CIPROFLOXACIN HCL 500 MG PO TABS: 500.0000 mg | ORAL_TABLET | Freq: Two times a day (BID) | ORAL | 0 refills | Status: AC

## 2016-05-27 MED ORDER — MIRTAZAPINE 30 MG PO TABS
30.0000 mg | ORAL_TABLET | Freq: Every day | ORAL | 0 refills | Status: DC
Start: 2016-05-27 — End: 2018-03-15

## 2016-05-27 MED ORDER — METRONIDAZOLE 500 MG PO TABS
500.0000 mg | ORAL_TABLET | Freq: Three times a day (TID) | ORAL | 0 refills | Status: AC
Start: 2016-05-27 — End: 2016-05-31

## 2016-05-27 MED ORDER — IBUPROFEN 600 MG PO TABS: 600.0000 mg | ORAL_TABLET | Freq: Four times a day (QID) | ORAL | 0 refills | Status: DC | PRN

## 2016-05-27 MED ORDER — ALBUTEROL SULFATE HFA 108 (90 BASE) MCG/ACT IN AERS: 1.0000 | INHALATION_SPRAY | Freq: Four times a day (QID) | RESPIRATORY_TRACT | 0 refills | Status: DC | PRN

## 2016-05-27 NOTE — Discharge Summary (Signed)
Physician Discharge Summary  Shelia Black  KPV:[id number]  DOB: 25-Jun-1921  DOA: 05/22/2016 PCP: Shelia Stalker, PA-C  Admit date: 05/22/2016 Discharge date: 05/27/2016  Admitted From: home Disposition:  home  Recommendations for Outpatient Follow-up:  1. Follow up with PCP in 1-2 weeks 2. Please obtain BMP/CBC in one week 3. Please follow up on the following pending results: final blood cultures, so far NGTD 4. Recommending pulmonology evaluation for ILD/pulmonary fibrosis.  Home Health: Black/RN  Discharge Condition: Stable  CODE STATUS: Full  Diet recommendation: Heart Healthy    Brief/Interim Summary: 83 y/o F with medical hx of DM, COPD, ILD/Pulm fibrosis, hypothyroidism, diverticulosis and GERD, presented to the ED c/o abdominal pain. Patient was found to have diverticulitis on CT and UTI. Patient admitted fof IV abx, she was started on Cipro and Flagyl. Urine cultures showed E coli sensitive to cipro. Patient subsequently improved and was started on a diet. Patient tolerating diet well although with lack of appetite for which Remeron dose was increased. Patient developed a severe headaches during hospital stay that were associated with nausea and vomiting. Head CT was done with negative results. Patient was evaluated by Black whom recommended home Black. Patient will be discharge home to complete total of 10 of oral antibiotics. She will follow up with her PCP in 1 week   Subjective: Patient seen and examined, c/o not sleeping well last night. Headaches has resolved. Tolerating diet well. Ambulating with Black with no issues.   Discharge Diagnoses/Hospital Course:  Acute diverticulitis - Improved, Continue IV antibiotics - cipro and flagyl - switched to PO abx given patient loss IV access  Complete Cipro and Flagyl for total of 10 days Tolerating diet well  Supportive treatment and pain medication as needed Diet education given and discussed with daughter  Follow-up with  PCP in 1 week  Sepsis 2/2 UTI - E. Coli, Pseudomonas - sepsis physiology resolved.  E coli sensitive to cipro - been treated with Cipro for diverticulitis, will cover UTI as well Urine culture shows Escherichia coli and Pseudomonas Treated Pyridium x 2 days  Migraine headache, unilateral throbbing pain, associated with nausea, and improve with rest  Ibuprofen and tylenol PRN  CT head was performed - no acute findings   SOB Due to fluid over load from aggressive hydration, UOP ok - resolved Patient with know hx of ILD/Pulm fibrosis   IVF D/ced  Lasix 40 mg OTO   DM type II -diet controlled at home  Monitor CBG's goal less than 140 fasting Follow up with PCP   GERD  PPI   Hypothyroidism  TSH on admission 0.01 T4 elevated 1.85 Synthroid dose decrease  Check TSH level in 6 weeks   ILD/Pulmonary fibrosis  Stable  Albuterol refilled  Patient may benefit from pulmonologist evaluation as outpatient given daughter report wheezing and SOB on and off. Patient not on medications for Pulm fibrosis   All other chronic medical condition were stable during the hospitalization.  Patient was seen by physical therapy recommending Shelia Black  On the day of the discharge the patient's vitals were stable, and no other acute medical condition were reported by patient. Patient was felt safe to be discharge to Home   Discharge Instructions  You were cared for by a hospitalist during your hospital stay. If you have any questions about your discharge medications or the care you received while you were in the hospital after you are discharged, you can call the unit and asked to speak with the  hospitalist on call if the hospitalist that took care of you is not available. Once you are discharged, your primary care physician will handle any further medical issues. Please note that NO REFILLS for any discharge medications will be authorized once you are discharged, as it is imperative that you return  to your primary care physician (or establish a relationship with a primary care physician if you do not have one) for your aftercare needs so that they can reassess your need for medications and monitor your lab values.  Discharge Instructions    Call MD for:  difficulty breathing, headache or visual disturbances    Complete by:  As directed    Call MD for:  extreme fatigue    Complete by:  As directed    Call MD for:  hives    Complete by:  As directed    Call MD for:  persistant dizziness or light-headedness    Complete by:  As directed    Call MD for:  persistant nausea and vomiting    Complete by:  As directed    Call MD for:  redness, tenderness, or signs of infection (pain, swelling, redness, odor or green/yellow discharge around incision site)    Complete by:  As directed    Call MD for:  severe uncontrolled pain    Complete by:  As directed    Call MD for:  temperature >100.4    Complete by:  As directed    Diet - low sodium heart healthy    Complete by:  As directed    Increase activity slowly    Complete by:  As directed      Allergies as of 05/27/2016      Reactions   Codeine    Nausea and vomiting   Percocet [oxycodone-acetaminophen] Nausea And Vomiting   Penicillins Hives, Rash   Has patient had a PCN reaction causing immediate rash, facial/tongue/throat swelling, SOB or lightheadedness with hypotension: no Has patient had a PCN reaction causing severe rash involving mucus membranes or skin necrosis: unknown Has patient had a PCN reaction that required hospitalization : unknown Has patient had a PCN reaction occurring within the last 10 years: no If all of the above answers are "NO", then may proceed with Cephalosporin use.      Medication List    STOP taking these medications   naproxen 500 MG tablet Commonly known as:  NAPROSYN   orphenadrine 100 MG tablet Commonly known as:  NORFLEX   traMADol-acetaminophen 37.5-325 MG tablet Commonly known as:   ULTRACET     TAKE these medications   acetaminophen 500 MG tablet Commonly known as:  TYLENOL Take 1,000 mg by mouth 2 (two) times daily.   albuterol 108 (90 Base) MCG/ACT inhaler Commonly known as:  PROVENTIL HFA;VENTOLIN HFA Inhale 1 puff into the lungs every 6 (six) hours as needed for wheezing or shortness of breath.   ALIGN PO Take 1 tablet by mouth daily.   cholecalciferol 1000 units tablet Commonly known as:  VITAMIN D Take 1,000 Units by mouth daily.   ciprofloxacin 500 MG tablet Commonly known as:  CIPRO Take 1 tablet (500 mg total) by mouth 2 (two) times daily.   diphenoxylate-atropine 2.5-0.025 MG tablet Commonly known as:  LOMOTIL Take 1 tablet by mouth 4 (four) times daily as needed for diarrhea or loose stools. diarrhea   Hydrocodone-Chlorpheniramine 5-4 MG/5ML Soln Take 5 mLs by mouth 2 (two) times daily as needed (cough).   ibuprofen 600  MG tablet Commonly known as:  ADVIL,MOTRIN Take 1 tablet (600 mg total) by mouth every 6 (six) hours as needed for headache. What changed:  medication strength  how much to take  when to take this  reasons to take this   levothyroxine 125 MCG tablet Commonly known as:  SYNTHROID, LEVOTHROID Take 125 mcg by mouth daily before breakfast.   metroNIDAZOLE 500 MG tablet Commonly known as:  FLAGYL Take 1 tablet (500 mg total) by mouth 3 (three) times daily. What changed:  when to take this   mirtazapine 30 MG tablet Commonly known as:  REMERON Take 1 tablet (30 mg total) by mouth at bedtime. What changed:  medication strength  how much to take   omeprazole 20 MG capsule Commonly known as:  PRILOSEC Take 20 mg by mouth daily.   vitamin C 500 MG tablet Commonly known as:  ASCORBIC ACID Take 500 mg by mouth daily.      Follow-up Information    Shelia Stalker, PA-C. Schedule an appointment as soon as possible for a visit in 1 week(s).   Specialty:  Family Medicine Contact information: [place] [address] [facility].   Why:  [place], physical therapy Contact information: [place] [address] [phone]          Allergies  Allergen Reactions  . Codeine     Nausea and vomiting  . Percocet [Oxycodone-Acetaminophen] Nausea And Vomiting  . Penicillins Hives and Rash    Has patient had a PCN reaction causing immediate rash, facial/tongue/throat swelling, SOB or lightheadedness with hypotension: no Has patient had a PCN reaction causing severe rash involving mucus membranes or skin necrosis: unknown Has patient had a PCN reaction that required hospitalization : unknown Has patient had a PCN reaction occurring within the last 10 years: no If all of the above answers are "NO", then may proceed with Cephalosporin use.     Procedures/Studies: Ct Head Wo Contrast  Result Date: 05/26/2016 CLINICAL DATA:  Headache and photophobia EXAM: CT HEAD WITHOUT CONTRAST TECHNIQUE: Contiguous axial images were obtained from the base of the skull through the vertex without intravenous contrast. COMPARISON:  None. FINDINGS: Brain: No mass lesion, intraparenchymal hemorrhage or extra-axial collection. No evidence of acute cortical infarct. Brain parenchyma and CSF-containing spaces are normal for age. Vascular: No hyperdense vessel or unexpected calcification. Skull: Normal visualized skull base, calvarium and extracranial soft tissues. Sinuses/Orbits: No sinus fluid levels or advanced mucosal thickening. No mastoid effusion. Normal orbits. IMPRESSION: Normal head CT. Electronically Signed   By: Ulyses Jarred M.D.   On: 05/26/2016 14:46   Ct Abdomen Pelvis W Contrast  Result Date: 05/23/2016 CLINICAL DATA:  Initial evaluation for acute abdominal pain. Blood in stool. History of diverticulitis. EXAM: CT ABDOMEN AND PELVIS WITH CONTRAST TECHNIQUE: Multidetector CT imaging of the abdomen and pelvis was  performed using the standard protocol following bolus administration of intravenous contrast. CONTRAST:  100 cc of Isovue-300. COMPARISON:  None available. FINDINGS: Lower chest: Scattered fibrotic lung changes present within the visualized lung bases. Visualized lung bases are otherwise clear. Hepatobiliary: Mild diffuse hepatic steatosis. Liver otherwise unremarkable. Gallbladder within normal limits. No biliary dilatation. Pancreas: Pancreas somewhat atrophic but without acute inflammatory changes or mass lesion. Spleen: Spleen within normal limits. Adrenals/Urinary Tract: Adrenal glands are normal. Kidneys equal in size with symmetric enhancement. 2.3 cm cysts noted within left kidney. Additional  subcentimeter hypodensities too small the characterize, but statistically likely reflects small cysts as well. No nephrolithiasis, hydronephrosis, or focal enhancing renal mass. No hydroureter. Bladder within normal limits. Stomach/Bowel: Small hiatal hernia noted. Diverticulum present at the gastric fundus without associated inflammation. Stomach otherwise unremarkable. No evidence for bowel obstruction. Extensive diverticulosis seen involving the descending and sigmoid colon. Hazy inflammatory stranding about a few diverticula within the sigmoid colon at the lower mid pelvis, compatible with acute sigmoid diverticulitis (series 2, image 65). No evidence for perforation or other complication. No other acute inflammatory changes seen about the bowels. Vascular/Lymphatic: Advanced atheromatous plaque within the intra-abdominal aorta. No aneurysm. No adenopathy. Reproductive: Uterus and ovaries within normal limits. Other: No free air or fluid. Small fat containing paraumbilical hernia noted. Musculoskeletal: No acute osseous abnormality. No worrisome lytic or blastic osseous lesions. IMPRESSION: 1. Findings consistent with acute sigmoid diverticulitis. No complication identified. 2. Additional chronic findings as above.  No other acute abnormality within the abdomen and pelvis. Electronically Signed   By: Jeannine Boga M.D.   On: 05/23/2016 01:47   Dg Chest Port 1 View  Result Date: 05/24/2016 CLINICAL DATA:  Shortness of breath.  Chronic cough. EXAM: PORTABLE CHEST 1 VIEW COMPARISON:  02/21/2015. FINDINGS: Chronic cardiomegaly. Aortic atherosclerosis. Chronic abnormal interstitial lung markings consistent with fibrosis. No evidence of infiltrate, collapse or effusion presently. No acute bone finding. IMPRESSION: Cardiomegaly. Aortic atherosclerosis. Chronic fibrotic lung density. No acute process evident. Electronically Signed   By: Nelson Chimes M.D.   On: 05/24/2016 16:27     Discharge Exam: Vitals:   05/26/16 2114 05/27/16 0455  BP: 106/66 99/68  Pulse: 72 81  Resp: 18 16  Temp: 97.9 F (36.6 C) 98 F (36.7 C)   Vitals:   05/26/16 0613 05/26/16 1340 05/26/16 2114 05/27/16 0455  BP: (!) 114/41 127/63 106/66 99/68  Pulse: 80 83 72 81  Resp: 18 17 18 16   Temp: 98 F (36.7 C) 98.5 F (36.9 C) 97.9 F (36.6 C) 98 F (36.7 C)  TempSrc: Axillary Oral Oral Oral  SpO2: 99% 92% 91% 92%  Weight:      Height:        General: Black is alert, awake, not in acute distress Cardiovascular: RRR, S1/S2 +, no rubs, no gallops Respiratory: CTA bilaterally, no wheezing, no rhonchi Abdominal: Soft, NT, ND, bowel sounds + Extremities: no edema, no cyanosis   The results of significant diagnostics from this hospitalization (including imaging, microbiology, ancillary and laboratory) are listed below for reference.     Microbiology: Recent Results (from the past 240 hour(s))  Urine culture     Status: Abnormal   Collection Time: 05/22/16 10:04 PM  Result Value Ref Range Status   Specimen Description URINE, RANDOM  Final   Special Requests NONE  Final   Culture (A)  Final    >=100,000 COLONIES/mL ESCHERICHIA COLI 60,000 COLONIES/mL PSEUDOMONAS AERUGINOSA    Report Status 05/27/2016 FINAL  Final    Organism ID, Bacteria ESCHERICHIA COLI (A)  Final   Organism ID, Bacteria PSEUDOMONAS AERUGINOSA (A)  Final      Susceptibility   Escherichia coli - MIC*    AMPICILLIN >=32 RESISTANT Resistant     CEFAZOLIN <=4 SENSITIVE Sensitive     CEFTRIAXONE <=1 SENSITIVE Sensitive     CIPROFLOXACIN 0.5 SENSITIVE Sensitive     GENTAMICIN <=1 SENSITIVE Sensitive     IMIPENEM <=0.25 SENSITIVE Sensitive     NITROFURANTOIN <=16 SENSITIVE Sensitive     TRIMETH/SULFA <=  20 SENSITIVE Sensitive     AMPICILLIN/SULBACTAM >=32 RESISTANT Resistant     PIP/TAZO <=4 SENSITIVE Sensitive     Extended ESBL NEGATIVE Sensitive     * >=100,000 COLONIES/mL ESCHERICHIA COLI   Pseudomonas aeruginosa - MIC*    CEFTAZIDIME <=1 SENSITIVE Sensitive     CIPROFLOXACIN <=0.25 SENSITIVE Sensitive     GENTAMICIN <=1 SENSITIVE Sensitive     IMIPENEM <=0.25 SENSITIVE Sensitive     PIP/TAZO <=4 SENSITIVE Sensitive     CEFEPIME <=1 SENSITIVE Sensitive     * 60,000 COLONIES/mL PSEUDOMONAS AERUGINOSA  Blood culture (routine x 2)     Status: None (Preliminary result)   Collection Time: 05/22/16 11:36 PM  Result Value Ref Range Status   Specimen Description BLOOD RIGHT ANTECUBITAL  Final   Special Requests   Final    BOTTLES DRAWN AEROBIC AND ANAEROBIC Blood Culture adequate volume   Culture   Final    NO GROWTH 3 DAYS Performed at [facility], [place] [address] [place], [place] [address]    Report Status PENDING  Incomplete  Blood culture (routine x 2)     Status: None (Preliminary result)   Collection Time: 05/22/16 11:53 PM  Result Value Ref Range Status   Specimen Description BLOOD BLOOD RIGHT FOREARM  Final   Special Requests   Final    BOTTLES DRAWN AEROBIC AND ANAEROBIC Blood Culture adequate volume   Culture   Final    NO GROWTH 3 DAYS Performed at [facility], [place] [address] [place], [place] [address]    Report Status PENDING  Incomplete     Labs: BNP (last 3 results) No results for input(s): BNP  in the last 8760 hours. Basic Metabolic Panel:  Recent Labs Lab 05/22/16 2218 05/23/16 0531 05/24/16 0515 05/25/16 0515 05/26/16 0829  NA 138 140 139 139 139  K 3.8 3.6 3.2* 3.2* 4.0  CL 104 108 111 107 107  CO2 26 21* 21* 24 25  GLUCOSE 152* 120* 100* 116* 142*  BUN 11 9 8 7 8   CREATININE 0.97 0.81 0.80 0.84 0.86  CALCIUM 9.2 8.4* 8.0* 8.3* 8.3*   Liver Function Tests:  Recent Labs Lab 05/22/16 2218  AST 18  ALT 10*  ALKPHOS 61  BILITOT 1.1  PROT 7.6  ALBUMIN 3.9    Recent Labs Lab 05/22/16 2218  LIPASE 20   No results for input(s): AMMONIA in the last 168 hours. CBC:  Recent Labs Lab 05/22/16 2218 05/23/16 0531 05/24/16 0515 05/25/16 0515  WBC 11.8* 12.6* 10.9* 9.3  HGB 14.4 12.7 11.5* 11.6*  HCT 42.4 38.5 34.2* 35.6*  MCV 98.4 99.2 98.8 99.4  PLT 218 193 185 195   Cardiac Enzymes: No results for input(s): CKTOTAL, CKMB, CKMBINDEX, TROPONINI in the last 168 hours. BNP: Invalid input(s): POCBNP CBG:  Recent Labs Lab 05/26/16 1158 05/26/16 1627 05/26/16 2122 05/27/16 0800 05/27/16 1201  GLUCAP 150* 142* 134* 118* 171*   D-Dimer No results for input(s): DDIMER in the last 72 hours. Hgb A1c No results for input(s): HGBA1C in the last 72 hours. Lipid Profile No results for input(s): CHOL, HDL, LDLCALC, TRIG, CHOLHDL, LDLDIRECT in the last 72 hours. Thyroid function studies No results for input(s): TSH, T4TOTAL, T3FREE, THYROIDAB in the last 72 hours.  Invalid input(s): FREET3 Anemia work up No results for input(s): VITAMINB12, FOLATE, FERRITIN, TIBC, IRON, RETICCTPCT in the last 72 hours. Urinalysis    Component Value Date/Time   COLORURINE AMBER (A)  05/22/2016 2204   APPEARANCEUR CLOUDY (A) 05/22/2016 2204   LABSPEC 1.015 05/22/2016 2204   PHURINE 6.0 05/22/2016 2204   GLUCOSEU NEGATIVE 05/22/2016 2204   HGBUR LARGE (A) 05/22/2016 2204   BILIRUBINUR NEGATIVE 05/22/2016 2204   KETONESUR 5 (A) 05/22/2016 2204   PROTEINUR 30 (A)  05/22/2016 2204   UROBILINOGEN 0.2 04/29/2014 0856   NITRITE NEGATIVE 05/22/2016 2204   LEUKOCYTESUR LARGE (A) 05/22/2016 2204   Sepsis Labs Invalid input(s): PROCALCITONIN,  WBC,  LACTICIDVEN Microbiology Recent Results (from the past 240 hour(s))  Urine culture     Status: Abnormal   Collection Time: 05/22/16 10:04 PM  Result Value Ref Range Status   Specimen Description URINE, RANDOM  Final   Special Requests NONE  Final   Culture (A)  Final    >=100,000 COLONIES/mL ESCHERICHIA COLI 60,000 COLONIES/mL PSEUDOMONAS AERUGINOSA    Report Status 05/27/2016 FINAL  Final   Organism ID, Bacteria ESCHERICHIA COLI (A)  Final   Organism ID, Bacteria PSEUDOMONAS AERUGINOSA (A)  Final      Susceptibility   Escherichia coli - MIC*    AMPICILLIN >=32 RESISTANT Resistant     CEFAZOLIN <=4 SENSITIVE Sensitive     CEFTRIAXONE <=1 SENSITIVE Sensitive     CIPROFLOXACIN 0.5 SENSITIVE Sensitive     GENTAMICIN <=1 SENSITIVE Sensitive     IMIPENEM <=0.25 SENSITIVE Sensitive     NITROFURANTOIN <=16 SENSITIVE Sensitive     TRIMETH/SULFA <=20 SENSITIVE Sensitive     AMPICILLIN/SULBACTAM >=32 RESISTANT Resistant     PIP/TAZO <=4 SENSITIVE Sensitive     Extended ESBL NEGATIVE Sensitive     * >=100,000 COLONIES/mL ESCHERICHIA COLI   Pseudomonas aeruginosa - MIC*    CEFTAZIDIME <=1 SENSITIVE Sensitive     CIPROFLOXACIN <=0.25 SENSITIVE Sensitive     GENTAMICIN <=1 SENSITIVE Sensitive     IMIPENEM <=0.25 SENSITIVE Sensitive     PIP/TAZO <=4 SENSITIVE Sensitive     CEFEPIME <=1 SENSITIVE Sensitive     * 60,000 COLONIES/mL PSEUDOMONAS AERUGINOSA  Blood culture (routine x 2)     Status: None (Preliminary result)   Collection Time: 05/22/16 11:36 PM  Result Value Ref Range Status   Specimen Description BLOOD RIGHT ANTECUBITAL  Final   Special Requests   Final    BOTTLES DRAWN AEROBIC AND ANAEROBIC Blood Culture adequate volume   Culture   Final    NO GROWTH 3 DAYS Performed at [facility]  Lab, [address], [place], [place] [address]    Report Status PENDING  Incomplete  Blood culture (routine x 2)     Status: None (Preliminary result)   Collection Time: 05/22/16 11:53 PM  Result Value Ref Range Status   Specimen Description BLOOD BLOOD RIGHT FOREARM  Final   Special Requests   Final    BOTTLES DRAWN AEROBIC AND ANAEROBIC Blood Culture adequate volume   Culture   Final    NO GROWTH 3 DAYS Performed at [facility], [place] [address] [place], [place] [address]    Report Status PENDING  Incomplete     Time coordinating discharge: 40 minutes  SIGNED:  Chipper Oman, MD  [facility] 05/27/2016, 1:18 PM  Pager please text page via  [email or web address] Password [email or web address]

## 2016-05-27 NOTE — Care Management Note (Signed)
Case Management Note  Patient Details  Name: Shelia Black MRN: [id number] Date of Birth: 1921-08-17  Subjective/Objective: Spoke to dtr [name]-chose [facility] HHRN/PT-ordered-rep Kim aware of d/c in am. Patient will stay w/dtr in [place]-see prior note for home address.Already has rw. No further CM needs.                   Action/Plan:d/c home w/HHC.   Expected Discharge Date:                  Expected Discharge Plan:  Home w Home Health Services  In-House Referral:     Discharge planning Services  CM Consult  Post Acute Care Choice:    Choice offered to:  Adult Children  DME Arranged:    DME Agency:     HH Arranged:  RN, PT HH Agency:  [facility]  Status of Service:  Completed, signed off  If discussed at Long Length of Stay Meetings, dates discussed:    Additional Comments:  Lanier Clam, RN 05/27/2016, 12:48 PM

## 2016-05-27 NOTE — Progress Notes (Deleted)
Physician Discharge Summary  Shelia Black  TIR:[id number]  DOB: [birth year]  DOA: 05/22/2016 PCP: [name], PA-C  Admit date: 05/22/2016 Discharge date: 05/27/2016  Admitted From: home Disposition:  home  Recommendations for Outpatient Follow-up:  1. Follow up with PCP in 1-2 weeks 2. Please obtain BMP/CBC in one week 3. Please follow up on the following pending results: final blood cultures, so far NGTD 4. Recommending pulmonology evaluation for ILD/pulmonary fibrosis.  Home Health: PT/RN  Discharge Condition: Stable  CODE STATUS: Full  Diet recommendation: Heart Healthy    Brief/Interim Summary: 83 y/o F with medical hx of DM, COPD, ILD/Pulm fibrosis, hypothyroidism, diverticulosis and GERD, presented to the ED c/o abdominal pain. Patient was found to have diverticulitis on CT and UTI. Patient admitted fof IV abx, she was started on Cipro and Flagyl. Urine cultures showed E coli sensitive to cipro. Patient subsequently improved and was started on a diet. Patient tolerating diet well although with lack of appetite for which Remeron dose was increased. Patient developed a severe headaches during hospital stay that were associated with nausea and vomiting. Head CT was done with negative results. Patient was evaluated by PT whom recommended home PT. Patient will be discharge home to complete total of 10 of oral antibiotics. She will follow up with her PCP in 1 week   Subjective: Patient seen and examined, c/o not sleeping well last night. Headaches has resolved. Tolerating diet well. Ambulating with PT with no issues.   Discharge Diagnoses/Hospital Course:  Acute diverticulitis - Improved, Continue IV antibiotics - cipro and flagyl - switched to PO abx given patient loss IV access  Complete Cipro and Flagyl for total of 10 days Tolerating diet well  Supportive treatment and pain medication as needed Diet education given and discussed with daughter  Follow-up with PCP in  1 week  Sepsis 2/2 UTI - E. Coli, Pseudomonas - sepsis physiology resolved.  E coli sensitive to cipro - been treated with Cipro for diverticulitis, will cover UTI as well Urine culture shows Escherichia coli and Pseudomonas Treated Pyridium x 2 days  Migraine headache, unilateral throbbing pain, associated with nausea, and improve with rest  Ibuprofen and tylenol PRN  CT head was performed - no acute findings   SOB  Due to fluid over load from aggressive hydration, UOP ok - resolved Patient with know hx of ILD/Pulm fibrosis   IVF D/ced   Lasix 40 mg OTO   DM type II - diet controlled at home  Monitor CBG's goal less than 140 fasting Follow up with PCP   GERD  PPI   Hypothyroidism  TSH on admission 0.01 T4 elevated 1.85 Synthroid dose decrease  Check TSH level in 6 weeks   ILD/Pulmonary fibrosis  Stable  Albuterol refilled  Patient may benefit from pulmonologist evaluation as outpatient given daughter report wheezing and SOB on and off. Patient not on medications for Pulm fibrosis   All other chronic medical condition were stable during the hospitalization.  Patient was seen by physical therapy recommending [facility]  On the day of the discharge the patient's vitals were stable, and no other acute medical condition were reported by patient. Patient was felt safe to be discharge to Home   Discharge Instructions  You were cared for by a hospitalist during your hospital stay. If you have any questions about your discharge medications or the care you received while you were in the hospital after you are discharged, you can call the unit and asked to  speak with the hospitalist on call if the hospitalist that took care of you is not available. Once you are discharged, your primary care physician will handle any further medical issues. Please note that NO REFILLS for any discharge medications will be authorized once you are discharged, as it is imperative that you return to your  primary care physician (or establish a relationship with a primary care physician if you do not have one) for your aftercare needs so that they can reassess your need for medications and monitor your lab values.  Discharge Instructions    Call MD for:  difficulty breathing, headache or visual disturbances    Complete by:  As directed    Call MD for:  extreme fatigue    Complete by:  As directed    Call MD for:  hives    Complete by:  As directed    Call MD for:  persistant dizziness or light-headedness    Complete by:  As directed    Call MD for:  persistant nausea and vomiting    Complete by:  As directed    Call MD for:  redness, tenderness, or signs of infection (pain, swelling, redness, odor or green/yellow discharge around incision site)    Complete by:  As directed    Call MD for:  severe uncontrolled pain    Complete by:  As directed    Call MD for:  temperature >100.4    Complete by:  As directed    Diet - low sodium heart healthy    Complete by:  As directed    Increase activity slowly    Complete by:  As directed      Allergies as of 05/27/2016      Reactions   Codeine    Nausea and vomiting   Percocet [oxycodone-acetaminophen] Nausea And Vomiting   Penicillins Hives, Rash   Has patient had a PCN reaction causing immediate rash, facial/tongue/throat swelling, SOB or lightheadedness with hypotension: no Has patient had a PCN reaction causing severe rash involving mucus membranes or skin necrosis: unknown Has patient had a PCN reaction that required hospitalization : unknown Has patient had a PCN reaction occurring within the last 10 years: no If all of the above answers are "NO", then may proceed with Cephalosporin use.      Medication List    STOP taking these medications   naproxen 500 MG tablet Commonly known as:  NAPROSYN   orphenadrine 100 MG tablet Commonly known as:  NORFLEX   traMADol-acetaminophen 37.5-325 MG tablet Commonly known as:  ULTRACET      TAKE these medications   acetaminophen 500 MG tablet Commonly known as:  TYLENOL Take 1,000 mg by mouth 2 (two) times daily.   albuterol 108 (90 Base) MCG/ACT inhaler Commonly known as:  PROVENTIL HFA;VENTOLIN HFA Inhale 1 puff into the lungs every 6 (six) hours as needed for wheezing or shortness of breath.   ALIGN PO Take 1 tablet by mouth daily.   cholecalciferol 1000 units tablet Commonly known as:  VITAMIN D Take 1,000 Units by mouth daily.   ciprofloxacin 500 MG tablet Commonly known as:  CIPRO Take 1 tablet (500 mg total) by mouth 2 (two) times daily.   diphenoxylate-atropine 2.5-0.025 MG tablet Commonly known as:  LOMOTIL Take 1 tablet by mouth 4 (four) times daily as needed for diarrhea or loose stools. diarrhea   Hydrocodone-Chlorpheniramine 5-4 MG/5ML Soln Take 5 mLs by mouth 2 (two) times daily as needed (cough).  ibuprofen 600 MG tablet Commonly known as:  ADVIL,MOTRIN Take 1 tablet (600 mg total) by mouth every 6 (six) hours as needed for headache. What changed:  medication strength  how much to take  when to take this  reasons to take this   levothyroxine 125 MCG tablet Commonly known as:  SYNTHROID, LEVOTHROID Take 125 mcg by mouth daily before breakfast.   metroNIDAZOLE 500 MG tablet Commonly known as:  FLAGYL Take 1 tablet (500 mg total) by mouth 3 (three) times daily. What changed:  when to take this   mirtazapine 30 MG tablet Commonly known as:  REMERON Take 1 tablet (30 mg total) by mouth at bedtime. What changed:  medication strength  how much to take   omeprazole 20 MG capsule Commonly known as:  PRILOSEC Take 20 mg by mouth daily.   vitamin C 500 MG tablet Commonly known as:  ASCORBIC ACID Take 500 mg by mouth daily.      Follow-up Information    [name], PA-C. Schedule an appointment as soon as possible for a visit in 1 week(s).   Specialty:  Family Medicine Contact information: [place] [address] [place] Follow up.   Why:  [place], physical therapy Contact information: [place] [address] [phone]          Allergies  Allergen Reactions  . Codeine     Nausea and vomiting  . Percocet [Oxycodone-Acetaminophen] Nausea And Vomiting  . Penicillins Hives and Rash    Has patient had a PCN reaction causing immediate rash, facial/tongue/throat swelling, SOB or lightheadedness with hypotension: no Has patient had a PCN reaction causing severe rash involving mucus membranes or skin necrosis: unknown Has patient had a PCN reaction that required hospitalization : unknown Has patient had a PCN reaction occurring within the last 10 years: no If all of the above answers are "NO", then may proceed with Cephalosporin use.     Procedures/Studies: Ct Head Wo Contrast  Result Date: 05/26/2016 CLINICAL DATA:  Headache and photophobia EXAM: CT HEAD WITHOUT CONTRAST TECHNIQUE: Contiguous axial images were obtained from the base of the skull through the vertex without intravenous contrast. COMPARISON:  None. FINDINGS: Brain: No mass lesion, intraparenchymal hemorrhage or extra-axial collection. No evidence of acute cortical infarct. Brain parenchyma and CSF-containing spaces are normal for age. Vascular: No hyperdense vessel or unexpected calcification. Skull: Normal visualized skull base, calvarium and extracranial soft tissues. Sinuses/Orbits: No sinus fluid levels or advanced mucosal thickening. No mastoid effusion. Normal orbits. IMPRESSION: Normal head CT. Electronically Signed   By: Ulyses Jarred M.D.   On: 05/26/2016 14:46   Ct Abdomen Pelvis W Contrast  Result Date: 05/23/2016 CLINICAL DATA:  Initial evaluation for acute abdominal pain. Blood in stool. History of diverticulitis. EXAM: CT ABDOMEN AND PELVIS WITH CONTRAST TECHNIQUE: Multidetector CT imaging of the abdomen and pelvis was  performed using the standard protocol following bolus administration of intravenous contrast. CONTRAST:  100 cc of Isovue-300. COMPARISON:  None available. FINDINGS: Lower chest: Scattered fibrotic lung changes present within the visualized lung bases. Visualized lung bases are otherwise clear. Hepatobiliary: Mild diffuse hepatic steatosis. Liver otherwise unremarkable. Gallbladder within normal limits. No biliary dilatation. Pancreas: Pancreas somewhat atrophic but without acute inflammatory changes or mass lesion. Spleen: Spleen within normal limits. Adrenals/Urinary Tract: Adrenal glands are normal. Kidneys equal in size with symmetric enhancement. 2.3 cm cysts noted within left  kidney. Additional subcentimeter hypodensities too small the characterize, but statistically likely reflects small cysts as well. No nephrolithiasis, hydronephrosis, or focal enhancing renal mass. No hydroureter. Bladder within normal limits. Stomach/Bowel: Small hiatal hernia noted. Diverticulum present at the gastric fundus without associated inflammation. Stomach otherwise unremarkable. No evidence for bowel obstruction. Extensive diverticulosis seen involving the descending and sigmoid colon. Hazy inflammatory stranding about a few diverticula within the sigmoid colon at the lower mid pelvis, compatible with acute sigmoid diverticulitis (series 2, image 65). No evidence for perforation or other complication. No other acute inflammatory changes seen about the bowels. Vascular/Lymphatic: Advanced atheromatous plaque within the intra-abdominal aorta. No aneurysm. No adenopathy. Reproductive: Uterus and ovaries within normal limits. Other: No free air or fluid. Small fat containing paraumbilical hernia noted. Musculoskeletal: No acute osseous abnormality. No worrisome lytic or blastic osseous lesions. IMPRESSION: 1. Findings consistent with acute sigmoid diverticulitis. No complication identified. 2. Additional chronic findings as above.  No other acute abnormality within the abdomen and pelvis. Electronically Signed   By: Jeannine Boga M.D.   On: 05/23/2016 01:47   Dg Chest Port 1 View  Result Date: 05/24/2016 CLINICAL DATA:  Shortness of breath.  Chronic cough. EXAM: PORTABLE CHEST 1 VIEW COMPARISON:  02/21/2015. FINDINGS: Chronic cardiomegaly. Aortic atherosclerosis. Chronic abnormal interstitial lung markings consistent with fibrosis. No evidence of infiltrate, collapse or effusion presently. No acute bone finding. IMPRESSION: Cardiomegaly. Aortic atherosclerosis. Chronic fibrotic lung density. No acute process evident. Electronically Signed   By: Nelson Chimes M.D.   On: 05/24/2016 16:27     Discharge Exam: Vitals:   05/26/16 2114 05/27/16 0455  BP: 106/66 99/68  Pulse: 72 81  Resp: 18 16  Temp: 97.9 F (36.6 C) 98 F (36.7 C)   Vitals:   05/26/16 0613 05/26/16 1340 05/26/16 2114 05/27/16 0455  BP: (!) 114/41 127/63 106/66 99/68  Pulse: 80 83 72 81  Resp: 18 17 18 16   Temp: 98 F (36.7 C) 98.5 F (36.9 C) 97.9 F (36.6 C) 98 F (36.7 C)  TempSrc: Axillary Oral Oral Oral  SpO2: 99% 92% 91% 92%  Weight:      Height:        General: Pt is alert, awake, not in acute distress Cardiovascular: RRR, S1/S2 +, no rubs, no gallops Respiratory: CTA bilaterally, no wheezing, no rhonchi Abdominal: Soft, NT, ND, bowel sounds + Extremities: no edema, no cyanosis   The results of significant diagnostics from this hospitalization (including imaging, microbiology, ancillary and laboratory) are listed below for reference.     Microbiology: Recent Results (from the past 240 hour(s))  Urine culture     Status: Abnormal   Collection Time: 05/22/16 10:04 PM  Result Value Ref Range Status   Specimen Description URINE, RANDOM  Final   Special Requests NONE  Final   Culture (A)  Final    >=100,000 COLONIES/mL ESCHERICHIA COLI 60,000 COLONIES/mL PSEUDOMONAS AERUGINOSA    Report Status 05/27/2016 FINAL  Final    Organism ID, Bacteria ESCHERICHIA COLI (A)  Final   Organism ID, Bacteria PSEUDOMONAS AERUGINOSA (A)  Final      Susceptibility   Escherichia coli - MIC*    AMPICILLIN >=32 RESISTANT Resistant     CEFAZOLIN <=4 SENSITIVE Sensitive     CEFTRIAXONE <=1 SENSITIVE Sensitive     CIPROFLOXACIN 0.5 SENSITIVE Sensitive     GENTAMICIN <=1 SENSITIVE Sensitive     IMIPENEM <=0.25 SENSITIVE Sensitive     NITROFURANTOIN <=16 SENSITIVE Sensitive  TRIMETH/SULFA <=20 SENSITIVE Sensitive     AMPICILLIN/SULBACTAM >=32 RESISTANT Resistant     PIP/TAZO <=4 SENSITIVE Sensitive     Extended ESBL NEGATIVE Sensitive     * >=100,000 COLONIES/mL ESCHERICHIA COLI   Pseudomonas aeruginosa - MIC*    CEFTAZIDIME <=1 SENSITIVE Sensitive     CIPROFLOXACIN <=0.25 SENSITIVE Sensitive     GENTAMICIN <=1 SENSITIVE Sensitive     IMIPENEM <=0.25 SENSITIVE Sensitive     PIP/TAZO <=4 SENSITIVE Sensitive     CEFEPIME <=1 SENSITIVE Sensitive     * 60,000 COLONIES/mL PSEUDOMONAS AERUGINOSA  Blood culture (routine x 2)     Status: None (Preliminary result)   Collection Time: 05/22/16 11:36 PM  Result Value Ref Range Status   Specimen Description BLOOD RIGHT ANTECUBITAL  Final   Special Requests   Final    BOTTLES DRAWN AEROBIC AND ANAEROBIC Blood Culture adequate volume   Culture   Final    NO GROWTH 3 DAYS Performed at [facility], [address] [place], [place] [address]    Report Status PENDING  Incomplete  Blood culture (routine x 2)     Status: None (Preliminary result)   Collection Time: 05/22/16 11:53 PM  Result Value Ref Range Status   Specimen Description BLOOD BLOOD RIGHT FOREARM  Final   Special Requests   Final    BOTTLES DRAWN AEROBIC AND ANAEROBIC Blood Culture adequate volume   Culture   Final    NO GROWTH 3 DAYS Performed at [facility], [place] [address], [place], [place] [address]    Report Status PENDING  Incomplete     Labs: Basic Metabolic Panel:  Recent Labs Lab  05/22/16 2218 05/23/16 0531 05/24/16 0515 05/25/16 0515 05/26/16 0829  NA 138 140 139 139 139  K 3.8 3.6 3.2* 3.2* 4.0  CL 104 108 111 107 107  CO2 26 21* 21* 24 25  GLUCOSE 152* 120* 100* 116* 142*  BUN 11 9 8 7 8   CREATININE 0.97 0.81 0.80 0.84 0.86  CALCIUM 9.2 8.4* 8.0* 8.3* 8.3*   Liver Function Tests:  Recent Labs Lab 05/22/16 2218  AST 18  ALT 10*  ALKPHOS 61  BILITOT 1.1  PROT 7.6  ALBUMIN 3.9    Recent Labs Lab 05/22/16 2218  LIPASE 20   CBC:  Recent Labs Lab 05/22/16 2218 05/23/16 0531 05/24/16 0515 05/25/16 0515  WBC 11.8* 12.6* 10.9* 9.3  HGB 14.4 12.7 11.5* 11.6*  HCT 42.4 38.5 34.2* 35.6*  MCV 98.4 99.2 98.8 99.4  PLT 218 193 185 195   CBG:  Recent Labs Lab 05/26/16 1158 05/26/16 1627 05/26/16 2122 05/27/16 0800 05/27/16 1201  GLUCAP 150* 142* 134* 118* 171*   Urinalysis    Component Value Date/Time   COLORURINE AMBER (A) 05/22/2016 2204   APPEARANCEUR CLOUDY (A) 05/22/2016 2204   LABSPEC 1.015 05/22/2016 2204   PHURINE 6.0 05/22/2016 2204   GLUCOSEU NEGATIVE 05/22/2016 2204   HGBUR LARGE (A) 05/22/2016 2204   BILIRUBINUR NEGATIVE 05/22/2016 2204   KETONESUR 5 (A) 05/22/2016 2204   PROTEINUR 30 (A) 05/22/2016 2204   UROBILINOGEN 0.2 04/29/2014 0856   NITRITE NEGATIVE 05/22/2016 2204   LEUKOCYTESUR LARGE (A) 05/22/2016 2204   Microbiology Recent Results (from the past 240 hour(s))  Urine culture     Status: Abnormal   Collection Time: 05/22/16 10:04 PM  Result Value Ref Range Status   Specimen Description URINE, RANDOM  Final   Special Requests NONE  Final  Culture (A)  Final    >=100,000 COLONIES/mL ESCHERICHIA COLI 60,000 COLONIES/mL PSEUDOMONAS AERUGINOSA    Report Status 05/27/2016 FINAL  Final   Organism ID, Bacteria ESCHERICHIA COLI (A)  Final   Organism ID, Bacteria PSEUDOMONAS AERUGINOSA (A)  Final      Susceptibility   Escherichia coli - MIC*    AMPICILLIN >=32 RESISTANT Resistant     CEFAZOLIN <=4  SENSITIVE Sensitive     CEFTRIAXONE <=1 SENSITIVE Sensitive     CIPROFLOXACIN 0.5 SENSITIVE Sensitive     GENTAMICIN <=1 SENSITIVE Sensitive     IMIPENEM <=0.25 SENSITIVE Sensitive     NITROFURANTOIN <=16 SENSITIVE Sensitive     TRIMETH/SULFA <=20 SENSITIVE Sensitive     AMPICILLIN/SULBACTAM >=32 RESISTANT Resistant     PIP/TAZO <=4 SENSITIVE Sensitive     Extended ESBL NEGATIVE Sensitive     * >=100,000 COLONIES/mL ESCHERICHIA COLI   Pseudomonas aeruginosa - MIC*    CEFTAZIDIME <=1 SENSITIVE Sensitive     CIPROFLOXACIN <=0.25 SENSITIVE Sensitive     GENTAMICIN <=1 SENSITIVE Sensitive     IMIPENEM <=0.25 SENSITIVE Sensitive     PIP/TAZO <=4 SENSITIVE Sensitive     CEFEPIME <=1 SENSITIVE Sensitive     * 60,000 COLONIES/mL PSEUDOMONAS AERUGINOSA  Blood culture (routine x 2)     Status: None (Preliminary result)   Collection Time: 05/22/16 11:36 PM  Result Value Ref Range Status   Specimen Description BLOOD RIGHT ANTECUBITAL  Final   Special Requests   Final    BOTTLES DRAWN AEROBIC AND ANAEROBIC Blood Culture adequate volume   Culture   Final    NO GROWTH 3 DAYS Performed at [facility], [address] [place], [place] [address]    Report Status PENDING  Incomplete  Blood culture (routine x 2)     Status: None (Preliminary result)   Collection Time: 05/22/16 11:53 PM  Result Value Ref Range Status   Specimen Description BLOOD BLOOD RIGHT FOREARM  Final   Special Requests   Final    BOTTLES DRAWN AEROBIC AND ANAEROBIC Blood Culture adequate volume   Culture   Final    NO GROWTH 3 DAYS Performed at [facility], [place] [address] [place], [place] [address]    Report Status PENDING  Incomplete    Time coordinating discharge: 40 minutes  SIGNED:  Chipper Oman, MD  [facility] 05/27/2016, 12:54 PM  Pager please text page via  [email or web address] Password [email or web address]

## 2016-05-27 NOTE — Progress Notes (Signed)
Patient discharged home with daughter, discharge instructions/prescriptions given and explained to patient and she verbalized understanding, denies any distress. Accompanied home by daughter. No wound noted, skin intact.

## 2016-05-27 NOTE — Care Management Important Message (Signed)
Important Message  Patient Details  Name: Shelia Black MRN: [id number] Date of Birth: [birth year]   Medicare Important Message Given:  Yes    [name] 05/27/2016, 11:15 AMImportant Message  Patient Details  Name: Shelia Black MRN: [id number] Date of Birth: 04/26/1921   Medicare Important Message Given:  Yes    [name] 05/27/2016, 11:15 AM

## 2016-05-28 LAB — CULTURE, BLOOD (ROUTINE X 2)
CULTURE: NO GROWTH
CULTURE: NO GROWTH
SPECIAL REQUESTS: ADEQUATE
Special Requests: ADEQUATE

## 2017-05-05 ENCOUNTER — Encounter: Payer: Self-pay | Admitting: Physician Assistant

## 2017-05-30 ENCOUNTER — Ambulatory Visit (INDEPENDENT_AMBULATORY_CARE_PROVIDER_SITE_OTHER): Payer: Medicare Other | Admitting: Physician Assistant

## 2017-05-30 ENCOUNTER — Encounter: Payer: Self-pay | Admitting: Physician Assistant

## 2017-05-30 ENCOUNTER — Encounter (INDEPENDENT_AMBULATORY_CARE_PROVIDER_SITE_OTHER): Payer: Self-pay

## 2017-05-30 VITALS — BP 124/60 | HR 68 | Ht 63.5 in | Wt 151.0 lb

## 2017-05-30 DIAGNOSIS — K5732 Diverticulitis of large intestine without perforation or abscess without bleeding: Secondary | ICD-10-CM | POA: Diagnosis not present

## 2017-05-30 MED ORDER — CIPROFLOXACIN HCL 500 MG PO TABS: ORAL_TABLET | ORAL | 0 refills | Status: DC

## 2017-05-30 MED ORDER — METRONIDAZOLE 500 MG PO TABS: ORAL_TABLET | ORAL | 0 refills | Status: DC

## 2017-05-30 NOTE — Patient Instructions (Signed)
We have sent the following medications to your pharmacy for you to pick up at your convenience: [facility] [address]  1. Cipro 500 mg Take with food. 2. Metronidazole ( Flagy l) 500 mg , Take with food.   Call for any recurrent problems and ask for Amy's nurse, or if not better when you finish the antibiotics.   If you are age 82 or older, your body mass index should be between 23-30. Your Body mass index is 26.33 kg/m. If this is out of the aforementioned range listed, please consider follow up with your Primary Care Provider.

## 2017-05-30 NOTE — Progress Notes (Signed)
Subjective:    Patient ID: Shelia Black, female    DOB: Oct 13, 1921, 84 y.o.   MRN: [id number]  HPI Shelia Black is a pleasant 84 year old white female, new to GI today, self-referred with concerns about diverticulosis and diverticulitis. Patient has history of pulmonary fibrosis, interstitial lung disease, chronic kidney disease and adult onset diabetes mellitus.  She is from [place] and currently spends part of the year here with her daughter and the other half of the year in [place] with her son. Patient and daughter state that she has had previous episodes of diverticulitis and a couple of hospitalizations with diverticulitis.  She was actually admitted in April 2018 here with acute sigmoid diverticulitis.  She was treated by the hospitalist and was not seen by GI.  She responded to a course of Cipro and Flagyl.  She also had a sepsis at that same time secondary to multi-organism UTI. CT scan was done April 2018 during that episode which showed mild steatosis, small hiatal hernia, a gastric diverticulum in the fundus, she has extensive diverticulosis and had some hazy inflammatory stranding around the sigmoid colon consistent with diverticulitis. Patient has had previous colonoscopies done in [place] and believes the last one was greater than 10 years ago.  She thinks she has had polyps in the past. Patient had an episode onset a couple of weeks ago with lower abdominal pain and cramping.  Her daughter says she spent a lot of time in the bathroom but was not particularly having diarrhea she just had an urge for bowel movement.  No associated fever or chills that they are aware of no nausea or vomiting.  Over the past couple of days she seems to be doing a little bit better with less abdominal discomfort.  She says her bowel movements are fairly normal and she has not noted any blood.  Review of Systems Pertinent positive and negative review of systems were noted in the above HPI section.  All  other review of systems was otherwise negative.  Outpatient Encounter Medications as of 05/30/2017  Medication Sig  . acetaminophen (TYLENOL) 500 MG tablet Take by mouth. 1 in the morning and 2 at night  . albuterol (PROVENTIL HFA;VENTOLIN HFA) 108 (90 Base) MCG/ACT inhaler Inhale 1 puff into the lungs every 6 (six) hours as needed for wheezing or shortness of breath.  Marland Kitchen ammonium lactate (LAC-HYDRIN) 12 % lotion Apply 1 application topically as needed for dry skin.  . benzonatate (TESSALON) 100 MG capsule Take 100 mg by mouth 3 (three) times daily as needed for cough.  . Calcium Carbonate (CALCIUM 600 PO) Take 1 tablet by mouth 2 (two) times daily.  . Cholecalciferol (VITAMIN D-3) 5000 units TABS Take 1 tablet by mouth daily.  . diphenoxylate-atropine (LOMOTIL) 2.5-0.025 MG tablet Take 1 tablet by mouth 4 (four) times daily as needed for diarrhea or loose stools. diarrhea  . Hydrocodone-Chlorpheniramine 5-4 MG/5ML SOLN Take 5 mLs by mouth 2 (two) times daily as needed (cough).   Marland Kitchen ibuprofen (ADVIL,MOTRIN) 600 MG tablet Take 1 tablet (600 mg total) by mouth every 6 (six) hours as needed for headache.  . ipratropium (ATROVENT) 0.03 % nasal spray Place 2 sprays into both nostrils 2 (two) times daily.  Marland Kitchen levothyroxine (SYNTHROID, LEVOTHROID) 125 MCG tablet Take 125 mcg by mouth daily before breakfast.  . mirtazapine (REMERON) 30 MG tablet Take 1 tablet (30 mg total) by mouth at bedtime.  Marland Kitchen omeprazole (PRILOSEC) 20 MG capsule Take 20 mg by mouth daily.  Marland Kitchen  Probiotic Product (ALIGN PO) Take 1 tablet by mouth daily.  . vitamin C (ASCORBIC ACID) 500 MG tablet Take 500 mg by mouth daily.  . ciprofloxacin (CIPRO) 500 MG tablet Take 1 tab twice daily with food.  . metroNIDAZOLE (FLAGYL) 500 MG tablet Take 1 tab by mouth with food for 7 days.  . [DISCONTINUED] cholecalciferol (VITAMIN D) 1000 units tablet Take 1,000 Units by mouth daily.   No facility-administered encounter medications on file as of  05/30/2017.    Allergies  Allergen Reactions  . Codeine     Nausea and vomiting  . Percocet [Oxycodone-Acetaminophen] Nausea And Vomiting  . Penicillins Hives and Rash    Has patient had a PCN reaction causing immediate rash, facial/tongue/throat swelling, SOB or lightheadedness with hypotension: no Has patient had a PCN reaction causing severe rash involving mucus membranes or skin necrosis: unknown Has patient had a PCN reaction that required hospitalization : unknown Has patient had a PCN reaction occurring within the last 10 years: no If all of the above answers are "NO", then may proceed with Cephalosporin use.    Patient Active Problem List   Diagnosis Date Noted  . Sepsis (HCC) 05/23/2016  . Diverticulitis 05/23/2016  . GERD (gastroesophageal reflux disease) 05/23/2016  . Hypothyroidism 05/23/2016  . Depression 05/23/2016  . ILD (interstitial lung disease) (HCC)   . Pulmonary fibrosis (HCC)   . Acute UTI   . UTI (lower urinary tract infection)   . Hyperglycemia 02/20/2015  . Diabetes mellitus type 2 in obese (HCC) 02/20/2015  . CKD (chronic kidney disease) stage 3, GFR 30-59 ml/min (HCC) 02/20/2015  . UTI (urinary tract infection) 04/29/2014  . Diverticulitis of colon with bleeding 04/29/2014  . Acute respiratory failure with hypoxia (HCC) 04/29/2014  . Acute renal failure (HCC) 04/29/2014  . Hyperkalemia 04/29/2014  . Nausea and vomiting 04/29/2014   Social History   Socioeconomic History  . Marital status: Widowed    Spouse name: Not on file  . Number of children: 2  . Years of education: Not on file  . Highest education level: Not on file  Occupational History  . Occupation: retired  Engineer, production  . Financial resource strain: Not on file  . Food insecurity:    Worry: Not on file    Inability: Not on file  . Transportation needs:    Medical: Not on file    Non-medical: Not on file  Tobacco Use  . Smoking status: Never Smoker  . Smokeless tobacco: Never  Used  Substance and Sexual Activity  . Alcohol use: No  . Drug use: No  . Sexual activity: Not Currently    Birth control/protection: None  Lifestyle  . Physical activity:    Days per week: Not on file    Minutes per session: Not on file  . Stress: Not on file  Relationships  . Social connections:    Talks on phone: Not on file    Gets together: Not on file    Attends religious service: Not on file    Active member of club or organization: Not on file    Attends meetings of clubs or organizations: Not on file    Relationship status: Not on file  . Intimate partner violence:    Fear of current or ex partner: Not on file    Emotionally abused: Not on file    Physically abused: Not on file    Forced sexual activity: Not on file  Other Topics Concern  .  Not on file  Social History Narrative  . Not on file    Ms. Blazejewski's family history includes Diabetes in her mother and sister; Hypertension in her father; Kidney cancer in her mother; Skin cancer in her father; Supraventricular tachycardia in her father.      Objective:    Vitals:   05/30/17 1352  BP: 124/60  Pulse: 68    Physical Exam; well-developed elderly white female in no acute distress, accompanied by her daughter.  Patient is hard of hearing.  Blood pressure 124/60 pulse 68, height 5 foot 3, weight 151, BMI 26.3.  HEENT ;nontraumatic normocephalic EOMI PERRLA sclera anicteric, Cardiovascular; regular rate and rhythm with S1-S2 no murmur rub or gallop, Pulmonary ;clear bilaterally, Abdomen; soft, bowel sounds are present she has tenderness in the left lower quadrant which is mild no guarding or rebound no palpable mass or hepatosplenomegaly Rectal ;exam not done, Extremities; no clubbing cyanosis or edema skin warm dry, Neuro psych; mood and affect appropriate       Assessment & Plan:   #601 84 year old white female with previous history of diverticulitis and sigmoid diverticulitis documented on CT scan 1 year  ago at which time she was hospitalized. Patient presents now after onset about 2 weeks ago with left lower abdominal pain and cramping which is actually mildly improved over the past couple of days. She is tender in the left lower quadrant, exam is consistent with mild diverticulitis.  #2 chronic kidney disease #3.  Adult onset diabetes mellitus #4.  Pulmonary fibrosis/interstitial lung disease #5 remote history of polyps  Plan; Discussed general management of diverticulosis and diverticulitis.  We discussed high-fiber diet, liberal water intake and avoidance of popcorn and nuts, And avoidance of obstipation.  Daughter had several questions about specific foods. Will treat with a 7-day course of Cipro 500 mg p.o. twice daily and metronidazole 500 mg p.o. twice daily both to be taken with food. I do not think she needs any imaging at present. Patient's daughter is asked to call back if her symptoms have not completely resolved after she finishes the antibiotics and/or if she has any worsening of symptoms in the interim. Happy to see her back in follow-up for any problems in the future.     Mattea Seger S Marli Diego PA-C 05/30/2017   Cc: Jarrett SohoWharton, Courtney, PA-C

## 2017-06-02 NOTE — Progress Notes (Signed)
Reviewed and agree with initial management plan.  Zaineb Nowaczyk T. Haakon Titsworth, MD FACG 

## 2017-06-19 IMAGING — CT CT ABD-PELV W/ CM
2 of 5 series · 16 of 46 positions shown, 18 images · IV contrast (ISOVUE)
Comparison: None available.

CLINICAL DATA: Initial evaluation for acute abdominal pain. Blood
in stool. History of diverticulitis.

EXAM:
CT ABDOMEN AND PELVIS WITH CONTRAST
TECHNIQUE: Multidetector CT imaging of the abdomen and pelvis was performed
using the standard protocol following bolus administration of
intravenous contrast.
CONTRAST:  100 cc of [id number].

[Series 2: abd/pel with · axial · 0.75mm/px · z∈[-310,+76]mm · 13 of 87 slices shown, 15 images]
[im 5/87  soft-tissue]
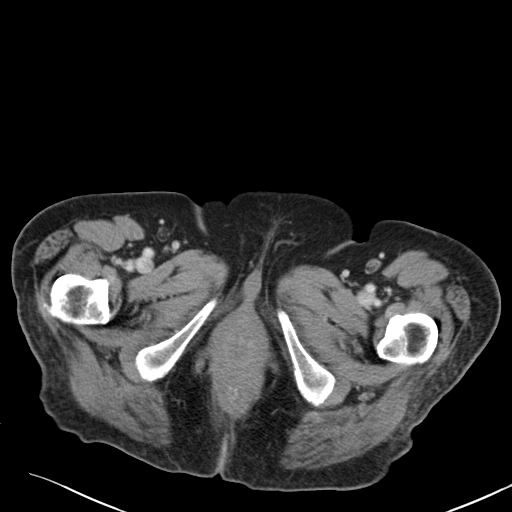
[im 5/87  bone]
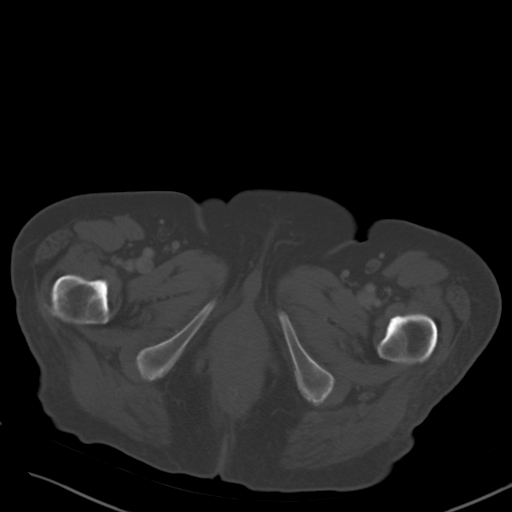
[im 10/87  soft-tissue]
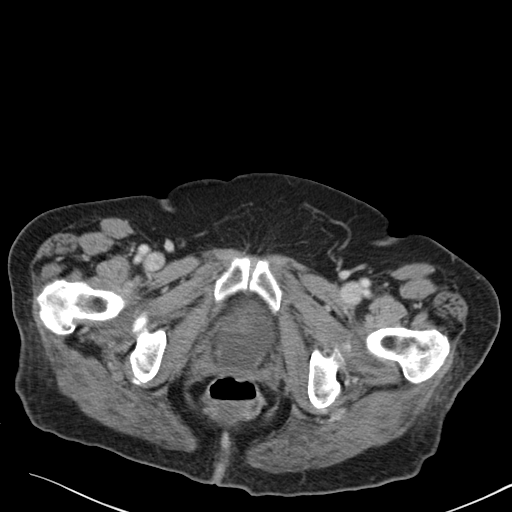
[im 20/87  soft-tissue]
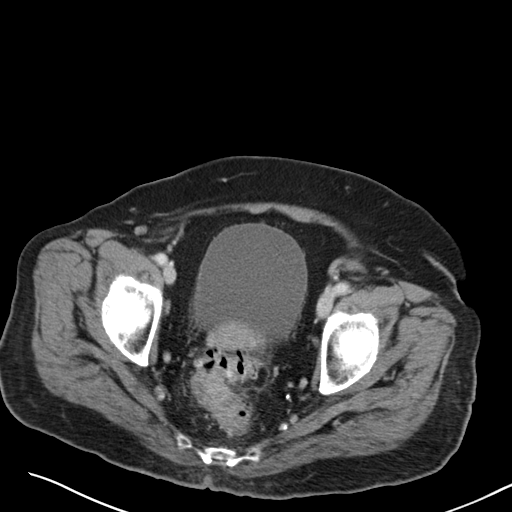
[im 24/87  soft-tissue]
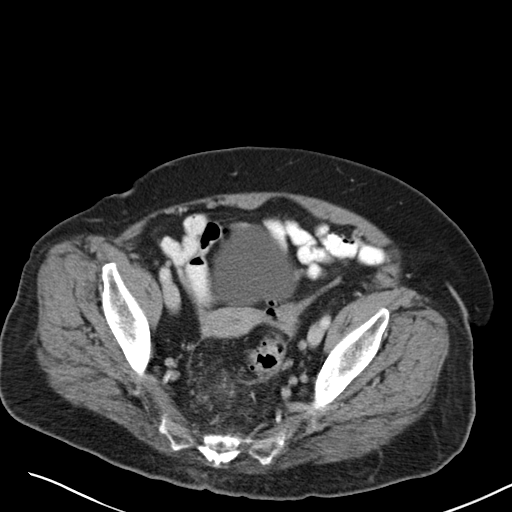
[im 29/87  soft-tissue]
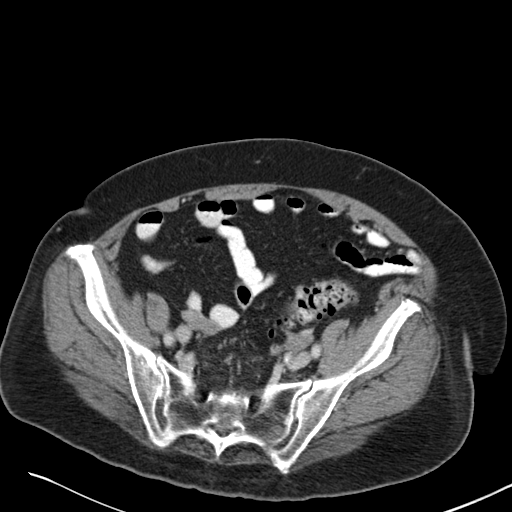
[im 39/87  soft-tissue]
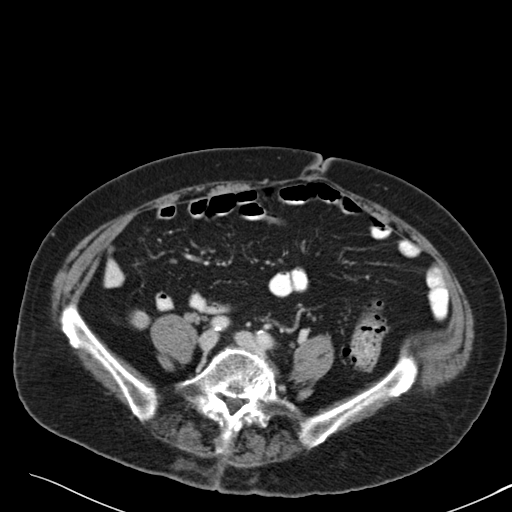
[im 44/87  soft-tissue]
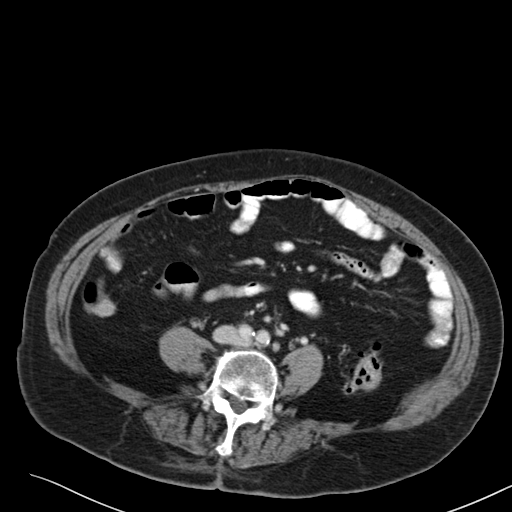
[im 48/87  soft-tissue]
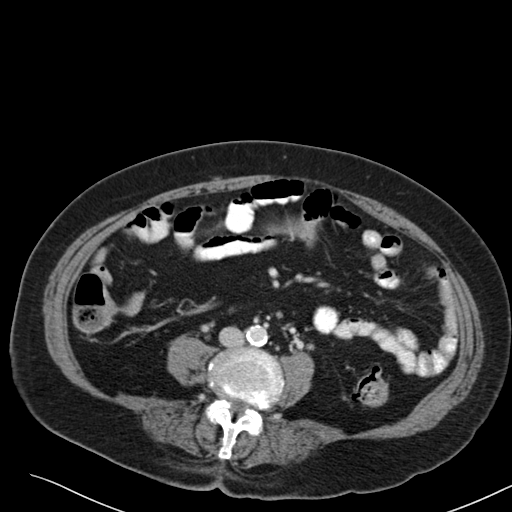
[im 58/87  soft-tissue]
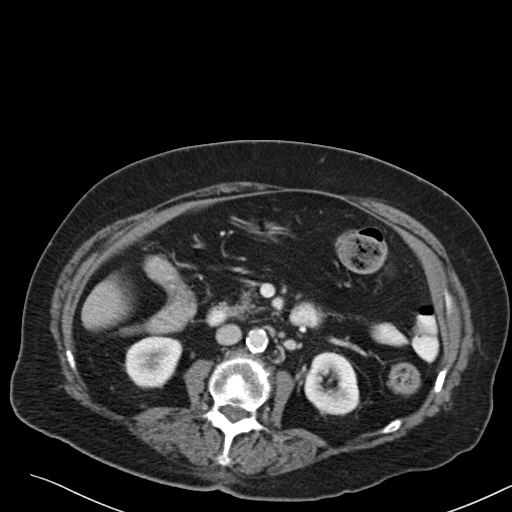
[im 58/87  bone]
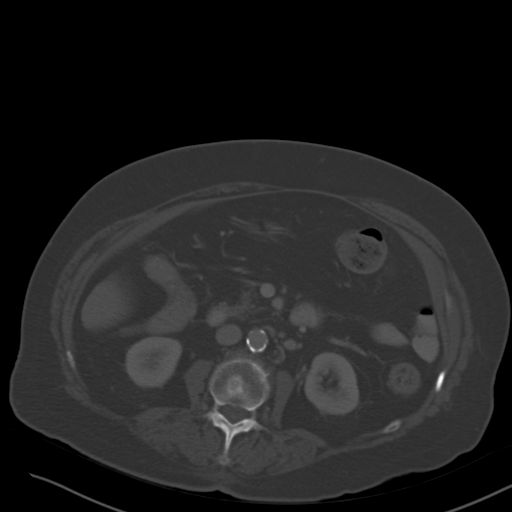
[im 63/87  soft-tissue]
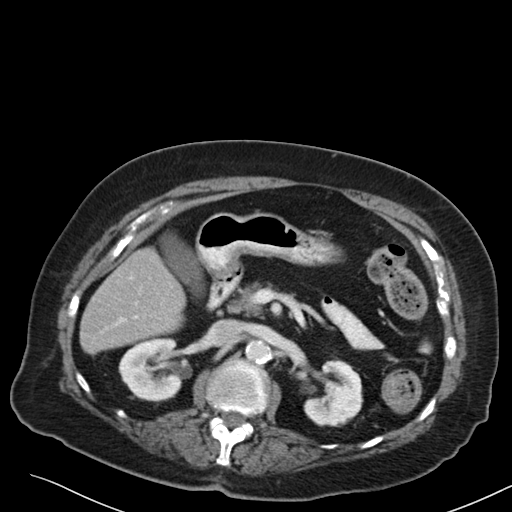
[im 67/87  soft-tissue]
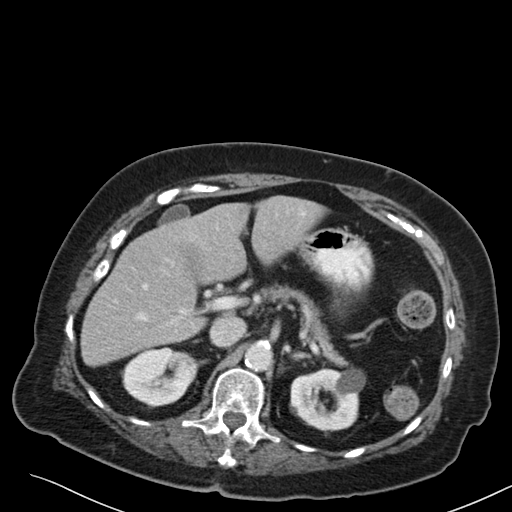
[im 77/87  soft-tissue]
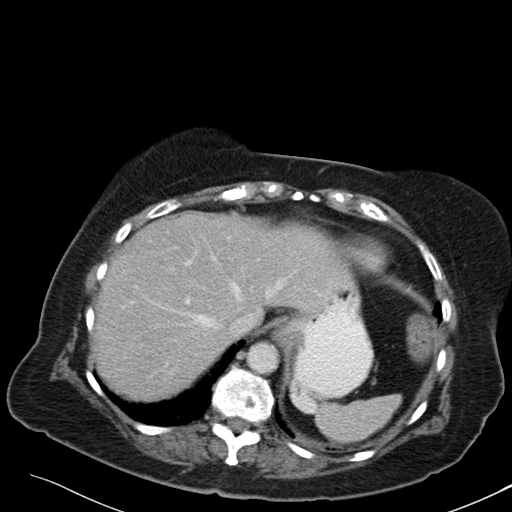
[im 82/87  soft-tissue]
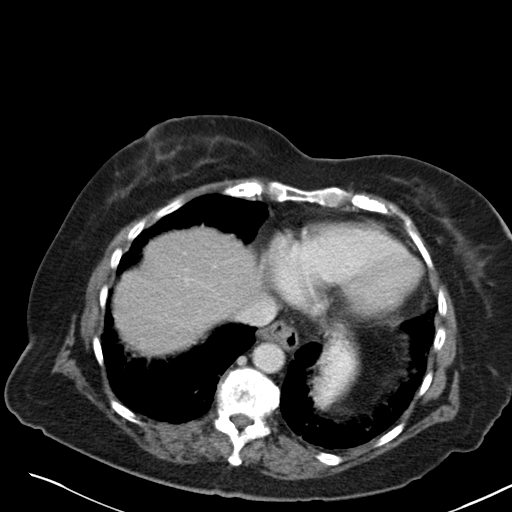

[Series 4: coronal a/|p · coronal · 0.70mm/px · 3 of 125 slices shown]
[im 42/125  soft-tissue]
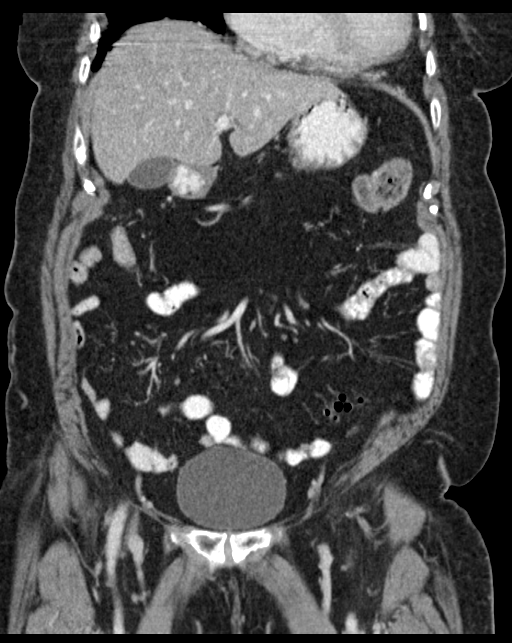
[im 56/125  soft-tissue]
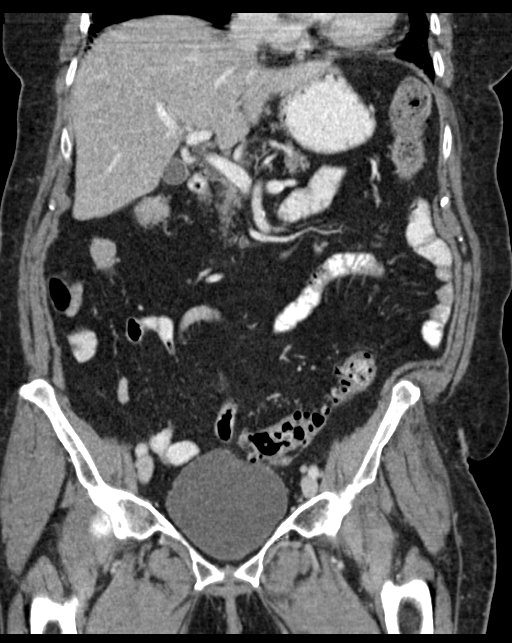
[im 69/125  soft-tissue]
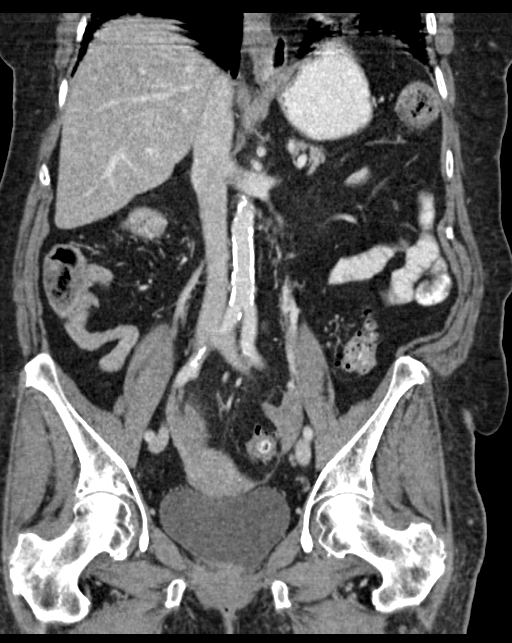

[16 of 46 positions shown; findings below may reference images not displayed]

FINDINGS: Lower chest: Scattered fibrotic lung changes present within the
visualized lung bases. Visualized lung bases are otherwise clear.

Hepatobiliary: Mild diffuse hepatic steatosis. Liver otherwise
unremarkable. Gallbladder within normal limits. No biliary
dilatation.

Pancreas: Pancreas somewhat atrophic but without acute inflammatory
changes or mass lesion.

Spleen: Spleen within normal limits.

Adrenals/Urinary Tract: Adrenal glands are normal. Kidneys equal in
size with symmetric enhancement. 2.3 cm cysts noted within left
kidney. Additional subcentimeter hypodensities too small the
characterize, but statistically likely reflects small cysts as well.
No nephrolithiasis, hydronephrosis, or focal enhancing renal mass.
No hydroureter. Bladder within normal limits.

Stomach/Bowel: Small hiatal hernia noted. Diverticulum present at
the gastric fundus without associated inflammation. Stomach
otherwise unremarkable. No evidence for bowel obstruction. Extensive
diverticulosis seen involving the descending and sigmoid colon. Hazy
inflammatory stranding about a few diverticula within the sigmoid
colon at the lower mid pelvis, compatible with acute sigmoid
diverticulitis (series 2, image 65). No evidence for perforation or
other complication. No other acute inflammatory changes seen about
the bowels.

Vascular/Lymphatic: Advanced atheromatous plaque within the
intra-abdominal aorta. No aneurysm. No adenopathy.

Reproductive: Uterus and ovaries within normal limits.

Other: No free air or fluid. Small fat containing paraumbilical
hernia noted.

Musculoskeletal: No acute osseous abnormality. No worrisome lytic or
blastic osseous lesions.
IMPRESSION: 1. Findings consistent with acute sigmoid diverticulitis. No
complication identified.
2. Additional chronic findings as above. No other acute abnormality
within the abdomen and pelvis.

## 2017-06-22 IMAGING — CT CT HEAD W/O CM
3 series · 14 of 45 positions shown, 16 images · non-contrast
Comparison: None.

CLINICAL DATA: Headache and photophobia

EXAM:
CT HEAD WITHOUT CONTRAST
TECHNIQUE: Contiguous axial images were obtained from the base of the skull
through the vertex without intravenous contrast.

[Series 2: head wo · axial · 0.47mm/px · z∈[+1309,+1424]mm · 8 of 28 slices shown, 10 images]
[im 3/28  brain]
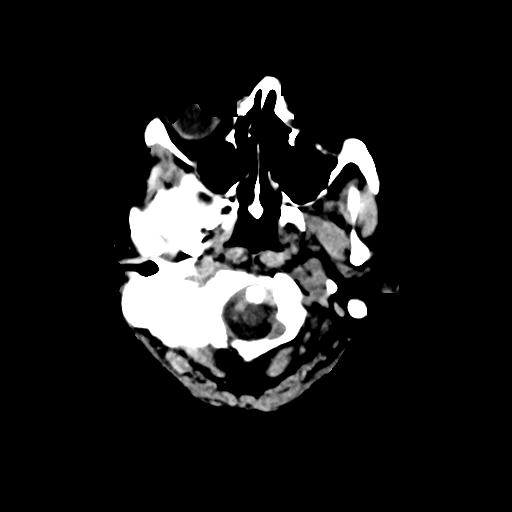
[im 3/28  bone]
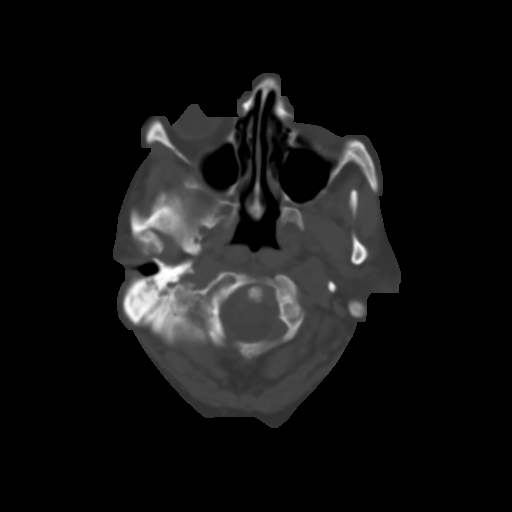
[im 6/28  brain]
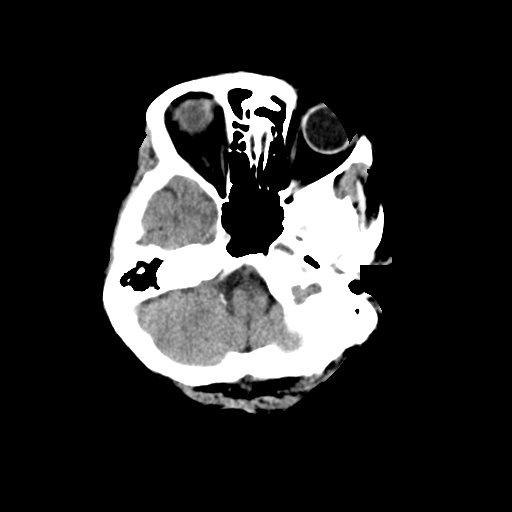
[im 10/28  brain]
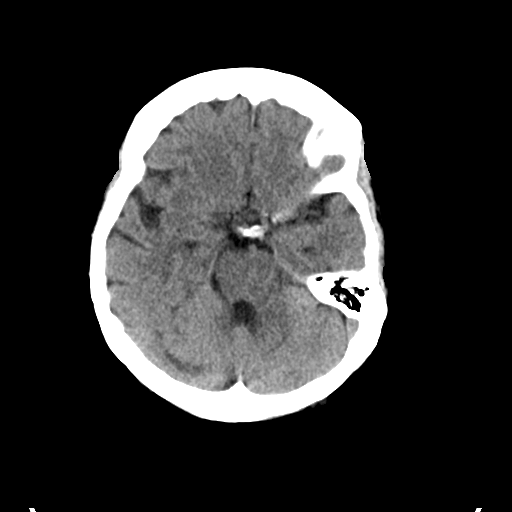
[im 13/28  brain]
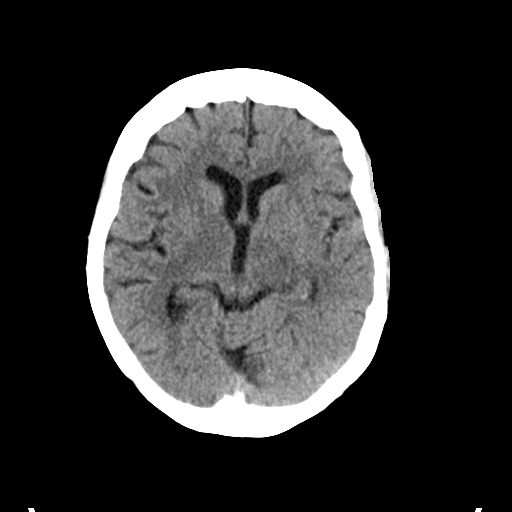
[im 16/28  brain]
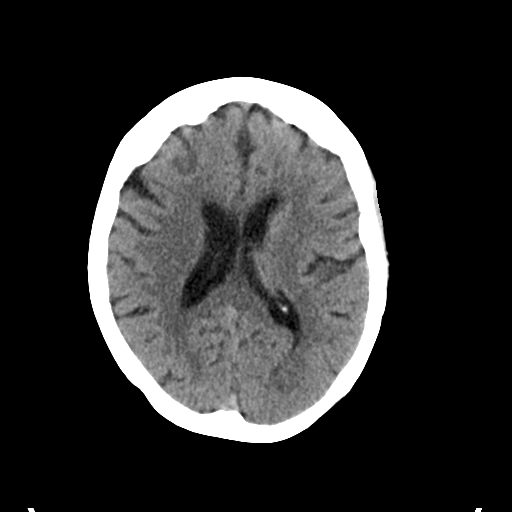
[im 16/28  bone]
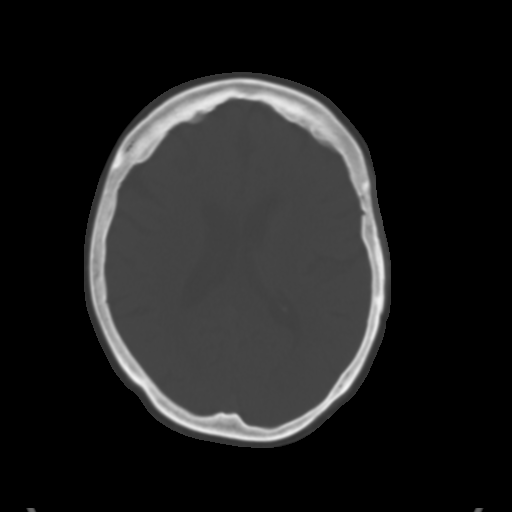
[im 19/28  brain]
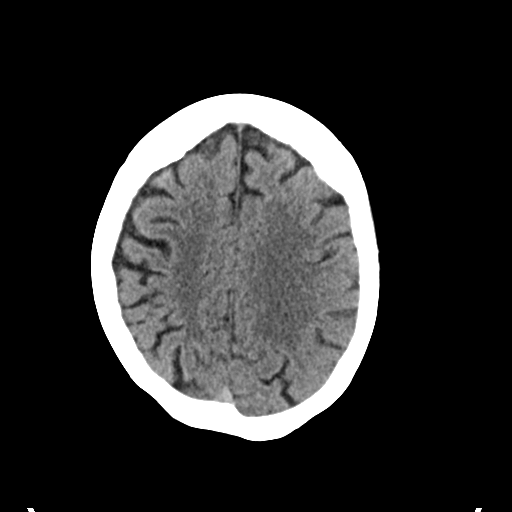
[im 23/28  brain]
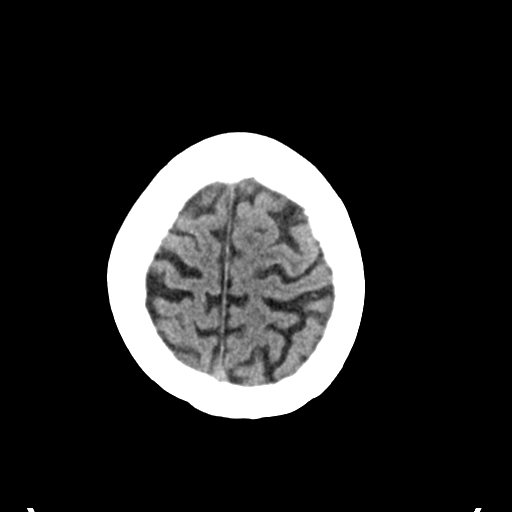
[im 26/28  brain]
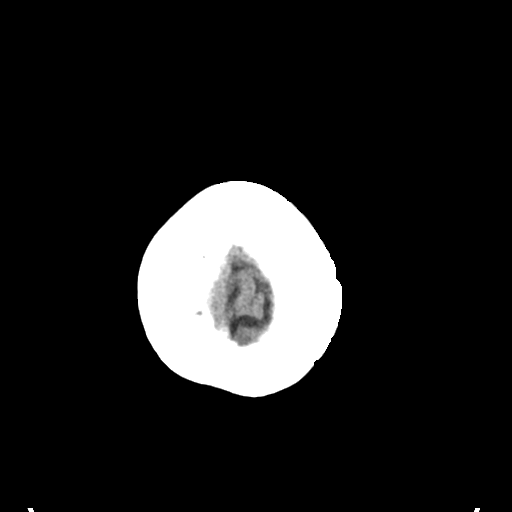

[Series 4: coronal soft tissue · coronal · 0.27mm/px · 3 of 67 slices shown]
[im 23/67  brain]
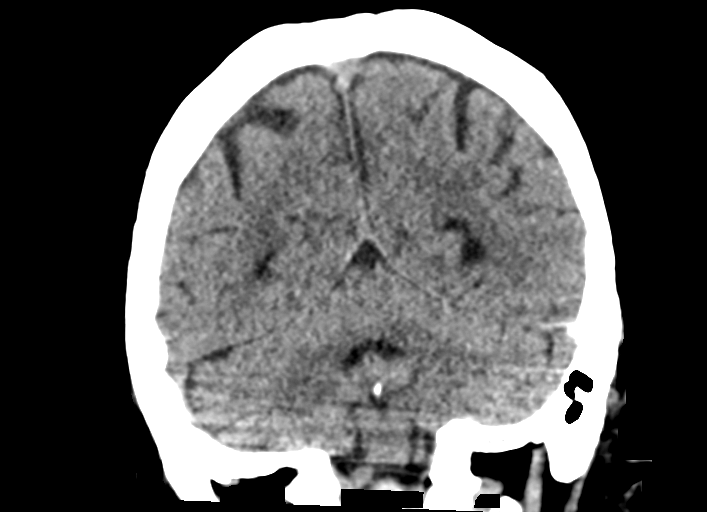
[im 30/67  brain]
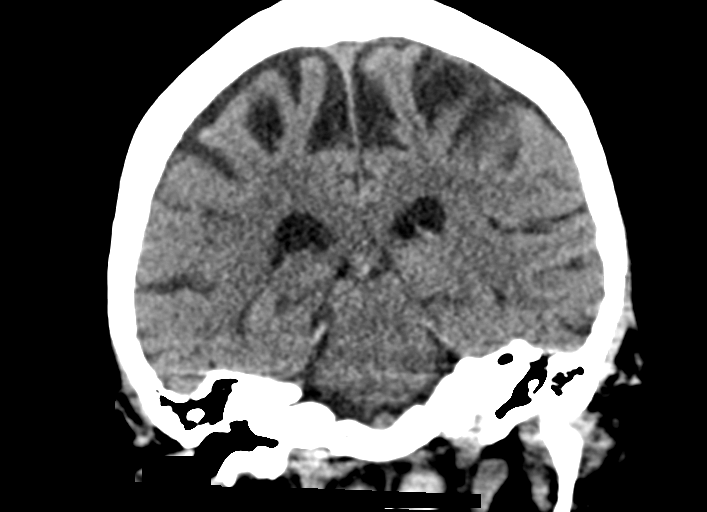
[im 37/67  brain]
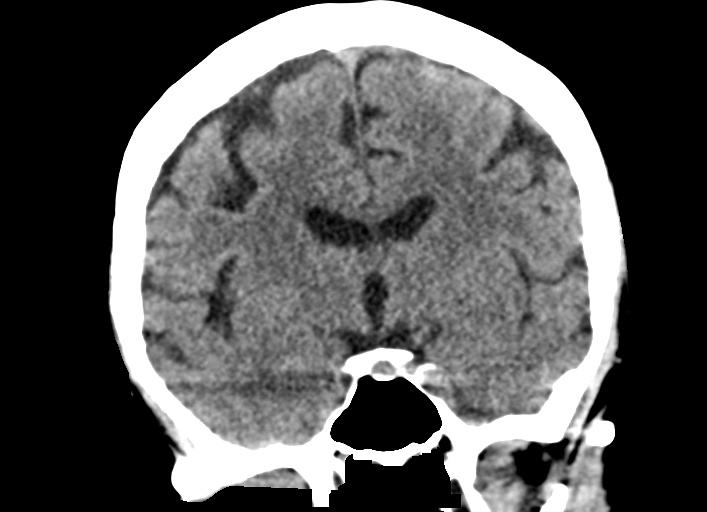

[Series 5: sagittal soft tissue · sagittal · 0.28mm/px · 3 of 67 slices shown]
[im 23/67  brain]
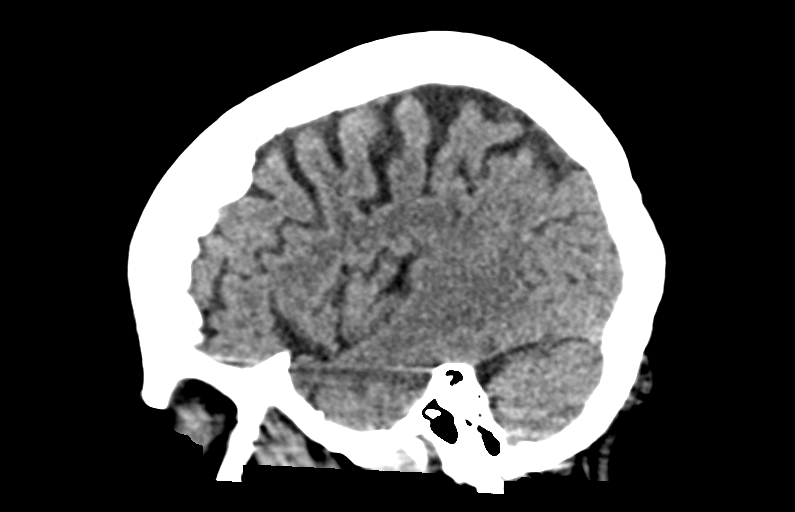
[im 34/67  brain]
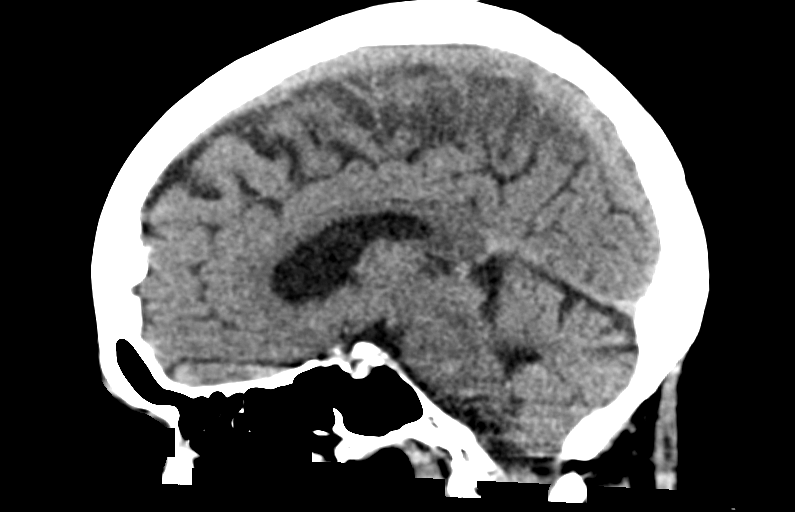
[im 45/67  brain]
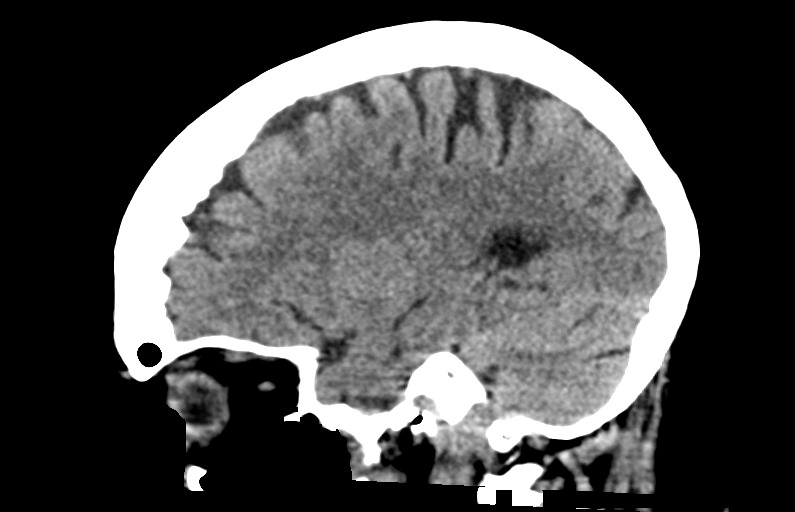

[14 of 45 positions shown; findings below may reference images not displayed]

FINDINGS: Brain: No mass lesion, intraparenchymal hemorrhage or extra-axial
collection. No evidence of acute cortical infarct. Brain parenchyma
and CSF-containing spaces are normal for age.

Vascular: No hyperdense vessel or unexpected calcification.

Skull: Normal visualized skull base, calvarium and extracranial soft
tissues.

Sinuses/Orbits: No sinus fluid levels or advanced mucosal
thickening. No mastoid effusion. Normal orbits.
IMPRESSION: Normal head CT.

## 2018-02-28 ENCOUNTER — Encounter (HOSPITAL_COMMUNITY): Payer: Self-pay

## 2018-02-28 ENCOUNTER — Other Ambulatory Visit: Payer: Self-pay

## 2018-02-28 ENCOUNTER — Emergency Department (HOSPITAL_COMMUNITY): Payer: Medicare Other

## 2018-02-28 ENCOUNTER — Emergency Department (HOSPITAL_COMMUNITY)
Admission: EM | Admit: 2018-02-28 | Discharge: 2018-03-01 | Disposition: A | Discharge status: Home / Self Care | Payer: Medicare Other | Attending: Emergency Medicine | Admitting: Emergency Medicine

## 2018-02-28 DIAGNOSIS — M542 Cervicalgia: Secondary | ICD-10-CM | POA: Diagnosis present

## 2018-02-28 DIAGNOSIS — E119 Type 2 diabetes mellitus without complications: Secondary | ICD-10-CM | POA: Diagnosis not present

## 2018-02-28 DIAGNOSIS — M62838 Other muscle spasm: Secondary | ICD-10-CM | POA: Diagnosis not present

## 2018-02-28 DIAGNOSIS — N183 Chronic kidney disease, stage 3 (moderate): Secondary | ICD-10-CM | POA: Diagnosis not present

## 2018-02-28 DIAGNOSIS — J449 Chronic obstructive pulmonary disease, unspecified: Secondary | ICD-10-CM | POA: Insufficient documentation

## 2018-02-28 DIAGNOSIS — Z79899 Other long term (current) drug therapy: Secondary | ICD-10-CM | POA: Diagnosis not present

## 2018-02-28 DIAGNOSIS — E039 Hypothyroidism, unspecified: Secondary | ICD-10-CM | POA: Insufficient documentation

## 2018-02-28 MED ORDER — METHOCARBAMOL 500 MG PO TABS: 500.0000 mg | ORAL_TABLET | Freq: Three times a day (TID) | ORAL | 0 refills | Status: DC | PRN

## 2018-02-28 MED ORDER — DIAZEPAM 5 MG/ML IJ SOLN: 2.0000 mg | Freq: Once | INTRAMUSCULAR | Status: DC

## 2018-02-28 MED ORDER — PREDNISONE 20 MG PO TABS: 40.0000 mg | ORAL_TABLET | Freq: Every day | ORAL | 0 refills | Status: DC

## 2018-02-28 MED ORDER — DIAZEPAM 5 MG/ML IJ SOLN
2.0000 mg | Freq: Once | INTRAMUSCULAR | Status: AC
Start: 2018-02-28 — End: 2018-02-28
  Administered 2018-02-28: 2 mg via INTRAMUSCULAR
  Filled 2018-02-28: qty 2

## 2018-02-28 MED ORDER — HYDROCODONE-ACETAMINOPHEN 5-325 MG PO TABS
1.0000 | ORAL_TABLET | Freq: Once | ORAL | Status: AC
  Administered 2018-02-28: 1 via ORAL
  Filled 2018-02-28: qty 1

## 2018-02-28 MED ORDER — KETOROLAC TROMETHAMINE 30 MG/ML IJ SOLN
15.0000 mg | Freq: Once | INTRAMUSCULAR | Status: AC
  Administered 2018-02-28: 15 mg via INTRAMUSCULAR
  Filled 2018-02-28: qty 1

## 2018-02-28 MED ORDER — PREDNISONE 20 MG PO TABS
40.0000 mg | ORAL_TABLET | Freq: Once | ORAL | Status: AC
  Administered 2018-02-28: 40 mg via ORAL
  Filled 2018-02-28: qty 2

## 2018-02-28 NOTE — ED Notes (Signed)
Patient transported to CT 

## 2018-02-28 NOTE — ED Notes (Addendum)
Pt ambulated with assistance to restroom

## 2018-02-28 NOTE — ED Triage Notes (Signed)
Pt states that she has had neck pain for 3-4 days. Pt states she thinks it is her arthritis, but is unsure. Pt states that the pain is worse on the right. Pt denies trauma.

## 2018-02-28 NOTE — ED Provider Notes (Signed)
[facility] DEPT Provider Note   CSN: [id number] Arrival date & time: 02/28/18  1558     History   Chief Complaint Chief Complaint  Patient presents with  . Neck Pain    HPI Shelia Black is a 85 y.o. female.  HPI Patient presents with neck pain.  Began 3 or 4 days ago.  Worse on the right side but does involve both sides of her neck.  Worse if he tries to move her head.  No trauma.  Her head is felt a little full at times but no confusion.  No fever.  No numbness or weakness.  Has had a history of vertigo but no vertigo within the last week.  No trauma.  States she has a history of arthritis and thinks this could be arthritis.  Has taken muscle relaxer and ibuprofen without relief. Past Medical History:  Diagnosis Date  . Arthritis   . Cataract   . Chronic cough   . COPD (chronic obstructive pulmonary disease) ([place])   . Diabetes mellitus without complication ([place])   . Diverticulitis   . Diverticulosis   . GERD (gastroesophageal reflux disease)   . Hepatic steatosis   . Hiatal hernia   . Hypothyroidism   . ILD (interstitial lung disease) ([place])   . Pulmonary fibrosis ([place])   . Thyroid disease   . Vertigo     Patient Active Problem List   Diagnosis Date Noted  . Sepsis ([place]) 05/23/2016  . Diverticulitis 05/23/2016  . GERD (gastroesophageal reflux disease) 05/23/2016  . Hypothyroidism 05/23/2016  . Depression 05/23/2016  . ILD (interstitial lung disease) ([place])   . Pulmonary fibrosis ([place])   . Acute UTI   . UTI (lower urinary tract infection)   . Hyperglycemia 02/20/2015  . Diabetes mellitus type 2 in obese ([place]) 02/20/2015  . CKD (chronic kidney disease) stage 3, GFR 30-59 ml/min ([facility]) 02/20/2015  . UTI (urinary tract infection) 04/29/2014  . Diverticulitis of colon with bleeding 04/29/2014  . Acute respiratory failure with hypoxia ([place]) 04/29/2014  . Acute renal failure ([place]) 04/29/2014  . Hyperkalemia 04/29/2014  . Nausea  and vomiting 04/29/2014    Past Surgical History:  Procedure Laterality Date  . APPENDECTOMY    . INTRAOCULAR LENS INSERTION  1997  . REPLACEMENT TOTAL KNEE BILATERAL    . SHOULDER SURGERY Right    tendonitis     OB History   No obstetric history on file.      Home Medications    Prior to Admission medications   Medication Sig Start Date End Date Taking? Authorizing Provider  acetaminophen (TYLENOL) 500 MG tablet Take by mouth. 1 in the morning and 2 at night    [provider]  albuterol (PROVENTIL HFA;VENTOLIN HFA) 108 (90 Base) MCG/ACT inhaler Inhale 1 puff into the lungs every 6 (six) hours as needed for wheezing or shortness of breath. 05/27/16   Doreatha Lew, MD  ammonium lactate (LAC-HYDRIN) 12 % lotion Apply 1 application topically as needed for dry skin.    [provider]  benzonatate (TESSALON) 100 MG capsule Take 100 mg by mouth 3 (three) times daily as needed for cough.    [provider]  Calcium Carbonate (CALCIUM 600 PO) Take 1 tablet by mouth 2 (two) times daily.    [provider]  Cholecalciferol (VITAMIN D-3) 5000 units TABS Take 1 tablet by mouth daily.    [provider]  ciprofloxacin (CIPRO) 500 MG tablet Take 1 tab  twice daily with food. 05/30/17   Esterwood, Amy S, PA-C  diphenoxylate-atropine (LOMOTIL) 2.5-0.025 MG tablet Take 1 tablet by mouth 4 (four) times daily as needed for diarrhea or loose stools. diarrhea 11/11/12   [provider]  Hydrocodone-Chlorpheniramine 5-4 MG/5ML SOLN Take 5 mLs by mouth 2 (two) times daily as needed (cough).     [provider]  ibuprofen (ADVIL,MOTRIN) 600 MG tablet Take 1 tablet (600 mg total) by mouth every 6 (six) hours as needed for headache. 05/27/16   Patrecia Pour, Christean Grief, MD  ipratropium (ATROVENT) 0.03 % nasal spray Place 2 sprays into both nostrils 2 (two) times daily.    [provider]  levothyroxine (SYNTHROID, LEVOTHROID) 125 MCG tablet  Take 125 mcg by mouth daily before breakfast.    [provider]  methocarbamol (ROBAXIN) 500 MG tablet Take 1 tablet (500 mg total) by mouth every 8 (eight) hours as needed for muscle spasms. 02/28/18   Davonna Belling, MD  metroNIDAZOLE (FLAGYL) 500 MG tablet Take 1 tab by mouth with food for 7 days. 05/30/17   Esterwood, Amy S, PA-C  mirtazapine (REMERON) 30 MG tablet Take 1 tablet (30 mg total) by mouth at bedtime. 05/27/16   Doreatha Lew, MD  omeprazole (PRILOSEC) 20 MG capsule Take 20 mg by mouth daily.    [provider]  predniSONE (DELTASONE) 20 MG tablet Take 2 tablets (40 mg total) by mouth daily. 03/01/18   Davonna Belling, MD  Probiotic Product (ALIGN PO) Take 1 tablet by mouth daily.    [provider]  vitamin C (ASCORBIC ACID) 500 MG tablet Take 500 mg by mouth daily.    [provider]    Family History Family History  Problem Relation Age of Onset  . Diabetes Mother   . Kidney cancer Mother   . Hypertension Father   . Supraventricular tachycardia Father   . Skin cancer Father   . Diabetes Sister     Social History Social History   Tobacco Use  . Smoking status: Never Smoker  . Smokeless tobacco: Never Used  Substance Use Topics  . Alcohol use: No  . Drug use: No     Allergies   Codeine; Percocet [oxycodone-acetaminophen]; and Penicillins   Review of Systems Review of Systems  Constitutional: Negative for appetite change.  HENT: Negative for congestion.   Respiratory: Negative for shortness of breath.   Cardiovascular: Negative for chest pain.  Gastrointestinal: Negative for abdominal distention.  Genitourinary: Negative for flank pain.  Musculoskeletal: Positive for neck pain. Negative for back pain.  Neurological: Negative for weakness.  Hematological: Negative for adenopathy.  Psychiatric/Behavioral: Negative for confusion.     Physical Exam Updated Vital Signs BP (!) 121/58 (BP Location: Left Arm)    Pulse (!) 55   Temp 97.6 F (36.4 C) (Oral)   Resp 16   Wt 68.5 kg   LMP  (LMP Unknown)   SpO2 94%   BMI 26.33 kg/m   Physical Exam Eyes:     Extraocular Movements: Extraocular movements intact.  Neck:     Comments: Decreased range of motion.  Tenderness upper cervical spine and right paraspinal area.  No skin changes.  No swelling. Cardiovascular:     Rate and Rhythm: Regular rhythm.  Pulmonary:     Effort: Pulmonary effort is normal.  Abdominal:     Tenderness: There is no abdominal tenderness.  Musculoskeletal:        General: No tenderness.  Skin:  General: Skin is warm.     Capillary Refill: Capillary refill takes less than 2 seconds.  Neurological:     Mental Status: She is alert and oriented to person, place, and time.     Cranial Nerves: No cranial nerve deficit.  Psychiatric:        Mood and Affect: Mood normal.      ED Treatments / Results  Labs (all labs ordered are listed, but only abnormal results are displayed) Labs Reviewed - No data to display  EKG None  Radiology Ct Cervical Spine Wo Contrast  Result Date: 02/28/2018 CLINICAL DATA:  Severe neck pain for 3-4 days without fall or known injury. Question arthritis. EXAM: CT CERVICAL SPINE WITHOUT CONTRAST TECHNIQUE: Multidetector CT imaging of the cervical spine was performed without intravenous contrast. Multiplanar CT image reconstructions were also generated. COMPARISON:  None. FINDINGS: Alignment: There is mild dextroconvex curvature of the cervical spine. Additionally there is reversal cervical lordosis attributable to degenerative disc disease, apex at C5. The craniocervical relationship appears intact. No splaying of the lateral masses of C1 on C2. The atlantodental interval is intact. Skull base and vertebrae: No skull base fracture or suspicious osseous lesions. No acute vertebral fracture or focal pathologic process. Soft tissues and spinal canal: No prevertebral soft tissue swelling. No visible  canal hematoma. Disc levels: C2-C3: Mild-to-moderate disc flattening. Minimal central disc bulge. No significant central foraminal encroachment. C3-C4: Moderate disc flattening without focal disc herniation or or disc bulge. No significant central or foraminal encroachment. Mild moderate right and mild left facet arthropathy. C4-C5: Marked disc flattening with anterior posterior osteophytes. No focal disc herniation. Moderate to marked right-sided foraminal encroachment from uncovertebral joint osteoarthritis and uncinate spurring. No significant left foraminal encroachment though there is uncovertebral joint spurring as well. C5-C6: Marked disc flattening with small posterior marginal osteophytes, central to right central. Slight right foraminal encroachment from uncinate spurring and uncovertebral joint osteoarthritis. C6-C7: Moderate to marked disc flattening, no significant central nor foraminal encroachment. Central osteophyte. Touching upon the ventral aspect of the thecal sac. C7-T1: Moderate disc flattening without central foraminal encroachment. Upper chest: Extracranial carotid arteriosclerosis is noted bilaterally. Subpleural fibrosis is seen at the lung apices. Other: None IMPRESSION: 1. No acute cervical spine fracture or posttraumatic listhesis. 2. Cervical spondylosis as above. Marked disc flattening C4-5, C5-6 and C6-7 as above described associated with uncovertebral joint osteoarthritis and uncinate spurring contributing to variable degrees of foraminal encroachment, greatest at C4-5 on the right. Electronically Signed   By: Ashley Royalty M.D.   On: 02/28/2018 19:30    Procedures Procedures (including critical care time)  Medications Ordered in ED Medications  ketorolac (TORADOL) 30 MG/ML injection 15 mg (15 mg Intramuscular Given 02/28/18 1947)  HYDROcodone-acetaminophen (NORCO/VICODIN) 5-325 MG per tablet 1 tablet (1 tablet Oral Given 02/28/18 2118)  diazepam (VALIUM) injection 2 mg (2 mg  Intramuscular Given 02/28/18 2235)  predniSONE (DELTASONE) tablet 40 mg (40 mg Oral Given 02/28/18 2349)     Initial Impression / Assessment and Plan / ED Course  I have reviewed the triage vital signs and the nursing notes.  Pertinent labs & imaging results that were available during my care of the patient were reviewed by me and considered in my medical decision making (see chart for details).     Patient with neck pain.  Appears to be musculoskeletal.  CT scan done and shows some degenerative disease.  Nonfocal exam otherwise.  No neuro deficits.  Pain somewhat improved after treatment with  Toradol Vicodin and IM Valium.  However still hurting.  Will discharge home with some Robaxin and given prednisone here and for home.  Follow-up as an outpatient.  Discharge home.  Muscle relaxers in a 85 year old is not ideal but I think she needs something more for pain control.  Final Clinical Impressions(s) / ED Diagnoses   Final diagnoses:  Neck pain  Muscle spasms of neck    ED Discharge Orders         Ordered    predniSONE (DELTASONE) 20 MG tablet  Daily     02/28/18 2334    methocarbamol (ROBAXIN) 500 MG tablet  Every 8 hours PRN     02/28/18 2334           Davonna Belling, MD 03/01/18 0010

## 2018-03-12 ENCOUNTER — Other Ambulatory Visit: Payer: Self-pay

## 2018-03-12 ENCOUNTER — Inpatient Hospital Stay (HOSPITAL_COMMUNITY)
Admission: EM | Admit: 2018-03-12 | Discharge: 2018-03-15 | DRG: 386 | Disposition: A | Discharge status: Home / Self Care | Payer: Medicare Other | Attending: Internal Medicine | Admitting: Internal Medicine

## 2018-03-12 ENCOUNTER — Emergency Department (HOSPITAL_COMMUNITY): Payer: Medicare Other

## 2018-03-12 ENCOUNTER — Encounter (HOSPITAL_COMMUNITY): Payer: Self-pay | Admitting: Emergency Medicine

## 2018-03-12 DIAGNOSIS — K760 Fatty (change of) liver, not elsewhere classified: Secondary | ICD-10-CM | POA: Diagnosis present

## 2018-03-12 DIAGNOSIS — K219 Gastro-esophageal reflux disease without esophagitis: Secondary | ICD-10-CM | POA: Diagnosis present

## 2018-03-12 DIAGNOSIS — K449 Diaphragmatic hernia without obstruction or gangrene: Secondary | ICD-10-CM | POA: Diagnosis present

## 2018-03-12 DIAGNOSIS — Z6825 Body mass index (BMI) 25.0-25.9, adult: Secondary | ICD-10-CM

## 2018-03-12 DIAGNOSIS — K51511 Left sided colitis with rectal bleeding: Secondary | ICD-10-CM | POA: Diagnosis not present

## 2018-03-12 DIAGNOSIS — E669 Obesity, unspecified: Secondary | ICD-10-CM | POA: Diagnosis present

## 2018-03-12 DIAGNOSIS — K314 Gastric diverticulum: Secondary | ICD-10-CM | POA: Diagnosis present

## 2018-03-12 DIAGNOSIS — Z79890 Hormone replacement therapy (postmenopausal): Secondary | ICD-10-CM

## 2018-03-12 DIAGNOSIS — Z880 Allergy status to penicillin: Secondary | ICD-10-CM

## 2018-03-12 DIAGNOSIS — E039 Hypothyroidism, unspecified: Secondary | ICD-10-CM | POA: Diagnosis present

## 2018-03-12 DIAGNOSIS — Z885 Allergy status to narcotic agent status: Secondary | ICD-10-CM

## 2018-03-12 DIAGNOSIS — K625 Hemorrhage of anus and rectum: Secondary | ICD-10-CM

## 2018-03-12 DIAGNOSIS — J849 Interstitial pulmonary disease, unspecified: Secondary | ICD-10-CM | POA: Diagnosis present

## 2018-03-12 DIAGNOSIS — E1122 Type 2 diabetes mellitus with diabetic chronic kidney disease: Secondary | ICD-10-CM | POA: Diagnosis present

## 2018-03-12 DIAGNOSIS — I129 Hypertensive chronic kidney disease with stage 1 through stage 4 chronic kidney disease, or unspecified chronic kidney disease: Secondary | ICD-10-CM | POA: Diagnosis present

## 2018-03-12 DIAGNOSIS — K529 Noninfective gastroenteritis and colitis, unspecified: Secondary | ICD-10-CM

## 2018-03-12 DIAGNOSIS — J449 Chronic obstructive pulmonary disease, unspecified: Secondary | ICD-10-CM | POA: Diagnosis present

## 2018-03-12 DIAGNOSIS — Z7952 Long term (current) use of systemic steroids: Secondary | ICD-10-CM

## 2018-03-12 DIAGNOSIS — E1169 Type 2 diabetes mellitus with other specified complication: Secondary | ICD-10-CM | POA: Diagnosis present

## 2018-03-12 DIAGNOSIS — N183 Chronic kidney disease, stage 3 (moderate): Secondary | ICD-10-CM | POA: Diagnosis present

## 2018-03-12 DIAGNOSIS — J8410 Pulmonary fibrosis, unspecified: Secondary | ICD-10-CM | POA: Diagnosis present

## 2018-03-12 DIAGNOSIS — Z96653 Presence of artificial knee joint, bilateral: Secondary | ICD-10-CM | POA: Diagnosis present

## 2018-03-12 DIAGNOSIS — Z8249 Family history of ischemic heart disease and other diseases of the circulatory system: Secondary | ICD-10-CM

## 2018-03-12 DIAGNOSIS — Z833 Family history of diabetes mellitus: Secondary | ICD-10-CM

## 2018-03-12 DIAGNOSIS — K559 Vascular disorder of intestine, unspecified: Secondary | ICD-10-CM | POA: Diagnosis present

## 2018-03-12 DIAGNOSIS — Z79899 Other long term (current) drug therapy: Secondary | ICD-10-CM

## 2018-03-12 LAB — COMPREHENSIVE METABOLIC PANEL
ALBUMIN: 3.9 g/dL (ref 3.5–5.0)
ALT: 18 U/L (ref 0–44)
ANION GAP: 7 (ref 5–15)
AST: 19 U/L (ref 15–41)
Alkaline Phosphatase: 54 U/L (ref 38–126)
BUN: 25 mg/dL — AB (ref 8–23)
CHLORIDE: 105 mmol/L (ref 98–111)
CO2: 26 mmol/L (ref 22–32)
Calcium: 9.3 mg/dL (ref 8.9–10.3)
Creatinine, Ser: 0.86 mg/dL (ref 0.44–1.00)
GFR calc Af Amer: >60 mL/min (ref >60)
GFR calc non Af Amer: 58 mL/min — ABNORMAL LOW (ref >60)
GLUCOSE: 110 mg/dL — AB (ref 70–99)
Potassium: 3.7 mmol/L (ref 3.5–5.1)
SODIUM: 138 mmol/L (ref 135–145)
TOTAL PROTEIN: 7.3 g/dL (ref 6.5–8.1)
Total Bilirubin: 0.5 mg/dL (ref 0.3–1.2)

## 2018-03-12 LAB — CBC WITH DIFFERENTIAL/PLATELET
Abs Immature Granulocytes: 0.37 10*3/uL — ABNORMAL HIGH (ref 0.00–0.07)
Basophils Absolute: 0.1 10*3/uL (ref 0.0–0.1)
Basophils Relative: 0 %
Eosinophils Absolute: 0 10*3/uL (ref 0.0–0.5)
Eosinophils Relative: 0 %
HCT: 43.5 % (ref 36.0–46.0)
HEMOGLOBIN: 14.1 g/dL (ref 12.0–15.0)
Immature Granulocytes: 2 %
Lymphocytes Relative: 15 %
Lymphs Abs: 3.1 10*3/uL (ref 0.7–4.0)
MCH: 31.8 pg (ref 26.0–34.0)
MCHC: 32.4 g/dL (ref 30.0–36.0)
MCV: 98 fL (ref 80.0–100.0)
MONO ABS: 1.6 10*3/uL — AB (ref 0.1–1.0)
Monocytes Relative: 8 %
Neutro Abs: 15.9 10*3/uL — ABNORMAL HIGH (ref 1.7–7.7)
Neutrophils Relative %: 75 %
Platelets: 239 10*3/uL (ref 150–400)
RBC: 4.44 MIL/uL (ref 3.87–5.11)
RDW: 14.6 % (ref 11.5–15.5)
WBC: 21.1 10*3/uL — ABNORMAL HIGH (ref 4.0–10.5)
nRBC: 0 % (ref 0.0–0.2)

## 2018-03-12 LAB — TYPE AND SCREEN
ABO/RH(D): O POS
Antibody Screen: NEGATIVE

## 2018-03-12 LAB — LIPASE, BLOOD: Lipase: 29 U/L (ref 11–51)

## 2018-03-12 MED ORDER — SODIUM CHLORIDE (PF) 0.9 % IJ SOLN
INTRAMUSCULAR | Status: AC
  Filled 2018-03-12: qty 50

## 2018-03-12 MED ORDER — FENTANYL CITRATE (PF) 100 MCG/2ML IJ SOLN
12.5000 ug | Freq: Once | INTRAMUSCULAR | Status: AC
  Administered 2018-03-12: 12.5 ug via INTRAVENOUS
  Filled 2018-03-12: qty 2

## 2018-03-12 MED ORDER — CIPROFLOXACIN IN D5W 400 MG/200ML IV SOLN
400.0000 mg | Freq: Once | INTRAVENOUS | Status: AC
  Administered 2018-03-13: 400 mg via INTRAVENOUS
  Filled 2018-03-12: qty 200

## 2018-03-12 MED ORDER — IOPAMIDOL (ISOVUE-300) INJECTION 61%
100.0000 mL | Freq: Once | INTRAVENOUS | Status: AC | PRN
  Administered 2018-03-12: 100 mL via INTRAVENOUS

## 2018-03-12 MED ORDER — METRONIDAZOLE IN NACL 5-0.79 MG/ML-% IV SOLN
500.0000 mg | Freq: Once | INTRAVENOUS | Status: AC
  Administered 2018-03-13: 500 mg via INTRAVENOUS
  Filled 2018-03-12: qty 100

## 2018-03-12 MED ORDER — SODIUM CHLORIDE 0.9 % IV SOLN
INTRAVENOUS | Status: DC
  Administered 2018-03-12: 21:00:00 via INTRAVENOUS

## 2018-03-12 MED ORDER — IOPAMIDOL (ISOVUE-300) INJECTION 61%
INTRAVENOUS | Status: AC
  Filled 2018-03-12: qty 100

## 2018-03-12 MED ORDER — FENTANYL CITRATE (PF) 100 MCG/2ML IJ SOLN
12.5000 ug | Freq: Once | INTRAMUSCULAR | Status: AC
  Administered 2018-03-13: 12.5 ug via INTRAVENOUS
  Filled 2018-03-12: qty 2

## 2018-03-12 MED ORDER — ONDANSETRON HCL 4 MG/2ML IJ SOLN
4.0000 mg | Freq: Once | INTRAMUSCULAR | Status: AC
  Administered 2018-03-12: 4 mg via INTRAVENOUS
  Filled 2018-03-12: qty 2

## 2018-03-12 NOTE — ED Triage Notes (Signed)
There patient is from home and complains of 10/10 low back pain and blood in the stool. Blood appeared to be areas of light pink in the stool as reported by EMS. Patient has a history of diverticulitis and hemroids.   EMS vitals and CBG: 142/66 BP 137 CBG 60 HR 98% O2 saturation on room air.

## 2018-03-12 NOTE — ED Notes (Signed)
Bed: YI94 Expected date:  Expected time:  Means of arrival:  Comments: EMS89yo bloody stool

## 2018-03-12 NOTE — ED Notes (Signed)
Attempted to obtain urine sample. Pt had episode of diarrhea in urine specimen.

## 2018-03-12 NOTE — ED Provider Notes (Signed)
[facility]-EMERGENCY DEPT Provider Note   CSN: [id number] Arrival date & time: 03/12/18  1845     History   Chief Complaint Chief Complaint  Patient presents with  . Rectal Bleeding    HPI Shelia Black is a 85 y.o. female with history of COPD, DM, diverticulosis presents with 3 day history of BRBPR and lower abdominal pain. Patient has been having approximately 3 episodes per day with BMs which she states are somewhat painful. Her abdominal pain is across her lower abdomen described as constant ache and rated 10/10. She endorses some lightheadedness since symptom onset but no syncope. Denies fevers, chills, chest pain, palpitations, n/v.     Past Medical History:  Diagnosis Date  . Arthritis   . Cataract   . Chronic cough   . COPD (chronic obstructive pulmonary disease) ([facility])   . Diabetes mellitus without complication ([facility])   . Diverticulitis   . Diverticulosis   . GERD (gastroesophageal reflux disease)   . Hepatic steatosis   . Hiatal hernia   . Hypothyroidism   . ILD (interstitial lung disease) ([facility])   . Pulmonary fibrosis ([facility])   . Thyroid disease   . Vertigo     Patient Active Problem List   Diagnosis Date Noted  . Sepsis ([facility]) 05/23/2016  . Diverticulitis 05/23/2016  . GERD (gastroesophageal reflux disease) 05/23/2016  . Hypothyroidism 05/23/2016  . Depression 05/23/2016  . ILD (interstitial lung disease) ([facility])   . Pulmonary fibrosis ([facility])   . Acute UTI   . UTI (lower urinary tract infection)   . Hyperglycemia 02/20/2015  . Diabetes mellitus type 2 in obese ([facility]) 02/20/2015  . CKD (chronic kidney disease) stage 3, GFR 30-59 ml/min ([facility]) 02/20/2015  . UTI (urinary tract infection) 04/29/2014  . Diverticulitis of colon with bleeding 04/29/2014  . Acute respiratory failure with hypoxia ([facility]) 04/29/2014  . Acute renal failure ([facility]) 04/29/2014  . Hyperkalemia 04/29/2014  . Nausea and vomiting 04/29/2014    Past Surgical History:   Procedure Laterality Date  . APPENDECTOMY    . INTRAOCULAR LENS INSERTION  1997  . REPLACEMENT TOTAL KNEE BILATERAL    . SHOULDER SURGERY Right    tendonitis     OB History   No obstetric history on file.      Home Medications    Prior to Admission medications   Medication Sig Start Date End Date Taking? Authorizing Provider  ammonium lactate (LAC-HYDRIN) 12 % lotion Apply 1 application topically as needed for dry skin.   Yes [provider]  Calcium Carbonate (CALCIUM 600 PO) Take 1 tablet by mouth 2 (two) times daily.   Yes [provider]  chlorpheniramine-HYDROcodone (TUSSIONEX) 10-8 MG/5ML SUER Take 5 mLs by mouth every 12 (twelve) hours as needed for cough.  01/14/18  Yes [provider]  Cholecalciferol (VITAMIN D-3) 5000 units TABS Take 1 tablet by mouth daily.   Yes [provider]  diphenoxylate-atropine (LOMOTIL) 2.5-0.025 MG tablet Take 1 tablet by mouth 4 (four) times daily as needed for diarrhea or loose stools. diarrhea 11/11/12  Yes [provider]  levothyroxine (SYNTHROID, LEVOTHROID) 125 MCG tablet Take 125 mcg by mouth daily before breakfast.   Yes [provider]  omeprazole (PRILOSEC) 20 MG capsule Take 20 mg by mouth daily.   Yes [provider]  predniSONE (DELTASONE) 20 MG tablet Take 2 tablets (40 mg total) by mouth daily. Patient taking differently: Take 20 mg by mouth 2 (two) times daily with a  meal.  03/01/18  Yes Benjiman CorePickering, Nathan, MD  Probiotic Product (ALIGN PO) Take 1 tablet by mouth daily.   Yes [provider]  vitamin C (ASCORBIC ACID) 500 MG tablet Take 500 mg by mouth daily.   Yes [provider]  acetaminophen (TYLENOL) 500 MG tablet Take 500 mg by mouth every 6 (six) hours as needed for moderate pain. 1 in the morning and 2 at night    [provider]  albuterol (PROVENTIL HFA;VENTOLIN HFA) 108 (90 Base) MCG/ACT inhaler Inhale 1 puff into the lungs every 6  (six) hours as needed for wheezing or shortness of breath. 05/27/16   Lenox PondsSilva Zapata, Edwin, MD  ciprofloxacin (CIPRO) 500 MG tablet Take 1 tab twice daily with food. Patient not taking: Reported on 03/12/2018 05/30/17   Esterwood, Amy S, PA-C  ibuprofen (ADVIL,MOTRIN) 600 MG tablet Take 1 tablet (600 mg total) by mouth every 6 (six) hours as needed for headache. Patient not taking: Reported on 03/12/2018 05/27/16   Randel PiggSilva Zapata, Dorma RussellEdwin, MD  methocarbamol (ROBAXIN) 500 MG tablet Take 1 tablet (500 mg total) by mouth every 8 (eight) hours as needed for muscle spasms. Patient not taking: Reported on 03/12/2018 02/28/18   Benjiman CorePickering, Nathan, MD  metroNIDAZOLE (FLAGYL) 500 MG tablet Take 1 tab by mouth with food for 7 days. Patient not taking: Reported on 03/12/2018 05/30/17   Esterwood, Amy S, PA-C  mirtazapine (REMERON) 30 MG tablet Take 1 tablet (30 mg total) by mouth at bedtime. Patient not taking: Reported on 03/12/2018 05/27/16   Lenox PondsSilva Zapata, Edwin, MD    Family History Family History  Problem Relation Age of Onset  . Diabetes Mother   . Kidney cancer Mother   . Hypertension Father   . Supraventricular tachycardia Father   . Skin cancer Father   . Diabetes Sister     Social History Social History   Tobacco Use  . Smoking status: Never Smoker  . Smokeless tobacco: Never Used  Substance Use Topics  . Alcohol use: No  . Drug use: No     Allergies   Codeine; Percocet [oxycodone-acetaminophen]; and Penicillins   Review of Systems Review of Systems  All other systems reviewed and are negative.    Physical Exam Updated Vital Signs BP 138/60   Pulse (!) 54   Temp 97.8 F (36.6 C) (Oral)   Resp (!) 26   LMP  (LMP Unknown)   SpO2 94%   Physical Exam Constitutional:      General: She is not in acute distress.    Appearance: Normal appearance.  HENT:     Head: Normocephalic and atraumatic.     Mouth/Throat:     Mouth: Mucous membranes are moist.     Pharynx: Oropharynx is clear.    Eyes:     Conjunctiva/sclera: Conjunctivae normal.  Cardiovascular:     Rate and Rhythm: Normal rate and regular rhythm.  Pulmonary:     Effort: Pulmonary effort is normal.     Breath sounds: Normal breath sounds.  Abdominal:     General: Bowel sounds are normal.     Palpations: Abdomen is soft.     Tenderness: There is abdominal tenderness in the right lower quadrant and left lower quadrant. There is no guarding or rebound.  Musculoskeletal:        General: No swelling.  Skin:    General: Skin is warm and dry.  Neurological:     General: No focal deficit present.     Mental  Status: She is alert.      ED Treatments / Results  Labs (all labs ordered are listed, but only abnormal results are displayed) Labs Reviewed  COMPREHENSIVE METABOLIC PANEL - Abnormal; Notable for the following components:      Result Value   Glucose, Bld 110 (*)    BUN 25 (*)    GFR calc non Af Amer 58 (*)    All other components within normal limits  CBC WITH DIFFERENTIAL/PLATELET - Abnormal; Notable for the following components:   WBC 21.1 (*)    Neutro Abs 15.9 (*)    Monocytes Absolute 1.6 (*)    Abs Immature Granulocytes 0.37 (*)    All other components within normal limits  LIPASE, BLOOD  URINALYSIS, ROUTINE W REFLEX MICROSCOPIC  POC OCCULT BLOOD, ED  TYPE AND SCREEN  ABO/RH    EKG None  Radiology Ct Abdomen Pelvis W Contrast  Result Date: 03/12/2018 CLINICAL DATA:  Low back pain, abdominal pain EXAM: CT ABDOMEN AND PELVIS WITH CONTRAST TECHNIQUE: Multidetector CT imaging of the abdomen and pelvis was performed using the standard protocol following bolus administration of intravenous contrast. CONTRAST:  ISOVUE-300 IOPAMIDOL (ISOVUE-300) INJECTION 61% COMPARISON:  05/23/2016 FINDINGS: Lower chest: Bilateral lower lung chronic interstitial disease with fibrosis. Hepatobiliary: No focal liver abnormality is seen. No gallstones, gallbladder wall thickening, or biliary dilatation.  Pancreas: Unremarkable. No pancreatic ductal dilatation or surrounding inflammatory changes. Spleen: Normal in size without focal abnormality. Adrenals/Urinary Tract: Adrenal glands are unremarkable. Kidneys are normal, without renal calculi, focal lesion, or hydronephrosis. Bladder is unremarkable. Stomach/Bowel: Stomach is within normal limits. Incidental note made of a posterior gastric diverticulum. No pneumatosis, pneumoperitoneum or portal venous gas. No normal nor abnormal appendix is identified. Diverticulosis of the descending and sigmoid colon. Mild bowel wall thickening involving the transverse, descending and sigmoid colon concerning for mild colitis. Vascular/Lymphatic: Abdominal aortic atherosclerosis. No lymphadenopathy. Reproductive: Uterus and bilateral adnexa are unremarkable. Other: No abdominal wall hernia or abnormality. No abdominopelvic ascites. Musculoskeletal: No acute osseous abnormality. No aggressive osseous lesion. Degenerative disc disease with disc height loss at L5-S1 with bilateral facet arthropathy. IMPRESSION: 1. Mild bowel wall thickening involving the transverse, descending and sigmoid colon concerning for mild colitis. 2. Diverticulosis of the descending and sigmoid colon. 3.  Aortic Atherosclerosis (ICD10-I70.0). Electronically Signed   By: Elige Ko   On: 03/12/2018 22:24    Procedures Procedures (including critical care time)  Medications Ordered in ED Medications  0.9 %  sodium chloride infusion ( Intravenous New Bag/Given 03/12/18 2045)  iopamidol (ISOVUE-300) 61 % injection (has no administration in time range)  sodium chloride (PF) 0.9 % injection (has no administration in time range)  ciprofloxacin (CIPRO) IVPB 400 mg (has no administration in time range)  metroNIDAZOLE (FLAGYL) IVPB 500 mg (has no administration in time range)  fentaNYL (SUBLIMAZE) injection 12.5 mcg (has no administration in time range)  ondansetron (ZOFRAN) injection 4 mg (4 mg  Intravenous Given 03/12/18 2041)  fentaNYL (SUBLIMAZE) injection 12.5 mcg (12.5 mcg Intravenous Given 03/12/18 2041)  iopamidol (ISOVUE-300) 61 % injection 100 mL (100 mLs Intravenous Contrast Given 03/12/18 2159)     Initial Impression / Assessment and Plan / ED Course  I have reviewed the triage vital signs and the nursing notes.  Pertinent labs & imaging results that were available during my care of the patient were reviewed by me and considered in my medical decision making (see chart for details).   85 y/o female with history of  diverticulosis presented with 3 days of BRBPR and lower abdominal pain. She is hemodynamically stable. Given h/o diverticulosis, will obtain CT to evaluate for diverticulitis given significant pain.     Final Clinical Impressions(s) / ED Diagnoses   Final diagnoses:  Colitis with rectal bleeding   Work-up revealed leukocytosis of 21. Hgb stable at 14. CT scan revealed colitis. Given her pain, uncontrolled BMs, and bleeding will admit for IV antibiotics and monitoring CBCs. Have discussed with hospitalist.     ED Discharge Orders    None       Bridget Hartshorn, DO 03/12/18 2341    Gerhard Munch, MD 03/12/18 [phone]

## 2018-03-13 ENCOUNTER — Encounter (HOSPITAL_COMMUNITY): Payer: Self-pay | Admitting: Internal Medicine

## 2018-03-13 DIAGNOSIS — I129 Hypertensive chronic kidney disease with stage 1 through stage 4 chronic kidney disease, or unspecified chronic kidney disease: Secondary | ICD-10-CM | POA: Diagnosis present

## 2018-03-13 DIAGNOSIS — E1169 Type 2 diabetes mellitus with other specified complication: Secondary | ICD-10-CM | POA: Diagnosis not present

## 2018-03-13 DIAGNOSIS — E1122 Type 2 diabetes mellitus with diabetic chronic kidney disease: Secondary | ICD-10-CM | POA: Diagnosis present

## 2018-03-13 DIAGNOSIS — K219 Gastro-esophageal reflux disease without esophagitis: Secondary | ICD-10-CM | POA: Diagnosis present

## 2018-03-13 DIAGNOSIS — Z6825 Body mass index (BMI) 25.0-25.9, adult: Secondary | ICD-10-CM | POA: Diagnosis not present

## 2018-03-13 DIAGNOSIS — Z885 Allergy status to narcotic agent status: Secondary | ICD-10-CM | POA: Diagnosis not present

## 2018-03-13 DIAGNOSIS — E669 Obesity, unspecified: Secondary | ICD-10-CM | POA: Diagnosis present

## 2018-03-13 DIAGNOSIS — K314 Gastric diverticulum: Secondary | ICD-10-CM | POA: Diagnosis present

## 2018-03-13 DIAGNOSIS — K760 Fatty (change of) liver, not elsewhere classified: Secondary | ICD-10-CM | POA: Diagnosis present

## 2018-03-13 DIAGNOSIS — K559 Vascular disorder of intestine, unspecified: Secondary | ICD-10-CM | POA: Diagnosis not present

## 2018-03-13 DIAGNOSIS — Z79890 Hormone replacement therapy (postmenopausal): Secondary | ICD-10-CM | POA: Diagnosis not present

## 2018-03-13 DIAGNOSIS — Z833 Family history of diabetes mellitus: Secondary | ICD-10-CM | POA: Diagnosis not present

## 2018-03-13 DIAGNOSIS — K51511 Left sided colitis with rectal bleeding: Secondary | ICD-10-CM | POA: Diagnosis present

## 2018-03-13 DIAGNOSIS — Z8249 Family history of ischemic heart disease and other diseases of the circulatory system: Secondary | ICD-10-CM | POA: Diagnosis not present

## 2018-03-13 DIAGNOSIS — R1012 Left upper quadrant pain: Secondary | ICD-10-CM

## 2018-03-13 DIAGNOSIS — K625 Hemorrhage of anus and rectum: Secondary | ICD-10-CM

## 2018-03-13 DIAGNOSIS — J449 Chronic obstructive pulmonary disease, unspecified: Secondary | ICD-10-CM | POA: Diagnosis present

## 2018-03-13 DIAGNOSIS — Z880 Allergy status to penicillin: Secondary | ICD-10-CM | POA: Diagnosis not present

## 2018-03-13 DIAGNOSIS — N183 Chronic kidney disease, stage 3 (moderate): Secondary | ICD-10-CM | POA: Diagnosis present

## 2018-03-13 DIAGNOSIS — K449 Diaphragmatic hernia without obstruction or gangrene: Secondary | ICD-10-CM | POA: Diagnosis present

## 2018-03-13 DIAGNOSIS — K529 Noninfective gastroenteritis and colitis, unspecified: Secondary | ICD-10-CM | POA: Diagnosis present

## 2018-03-13 DIAGNOSIS — J8410 Pulmonary fibrosis, unspecified: Secondary | ICD-10-CM | POA: Diagnosis present

## 2018-03-13 DIAGNOSIS — Z7952 Long term (current) use of systemic steroids: Secondary | ICD-10-CM | POA: Diagnosis not present

## 2018-03-13 DIAGNOSIS — Z96653 Presence of artificial knee joint, bilateral: Secondary | ICD-10-CM | POA: Diagnosis present

## 2018-03-13 DIAGNOSIS — E039 Hypothyroidism, unspecified: Secondary | ICD-10-CM | POA: Diagnosis present

## 2018-03-13 DIAGNOSIS — R1032 Left lower quadrant pain: Secondary | ICD-10-CM | POA: Diagnosis not present

## 2018-03-13 DIAGNOSIS — J849 Interstitial pulmonary disease, unspecified: Secondary | ICD-10-CM | POA: Diagnosis present

## 2018-03-13 DIAGNOSIS — K921 Melena: Secondary | ICD-10-CM | POA: Diagnosis not present

## 2018-03-13 DIAGNOSIS — Z79899 Other long term (current) drug therapy: Secondary | ICD-10-CM | POA: Diagnosis not present

## 2018-03-13 LAB — LACTIC ACID, PLASMA
LACTIC ACID, VENOUS: 2 mmol/L — AB (ref 0.5–1.9)
LACTIC ACID, VENOUS: 2.4 mmol/L — AB (ref 0.5–1.9)
Lactic Acid, Venous: 2 mmol/L (ref 0.5–1.9)

## 2018-03-13 LAB — CBC
HCT: 41.6 % (ref 36.0–46.0)
HCT: 41.1 % (ref 36.0–46.0)
Hemoglobin: 13.3 g/dL (ref 12.0–15.0)
Hemoglobin: 13 g/dL (ref 12.0–15.0)
MCH: 31.7 pg (ref 26.0–34.0)
MCH: 31.8 pg (ref 26.0–34.0)
MCHC: 32 g/dL (ref 30.0–36.0)
MCHC: 31.6 g/dL (ref 30.0–36.0)
MCV: 99.3 fL (ref 80.0–100.0)
MCV: 100.5 fL — ABNORMAL HIGH (ref 80.0–100.0)
NRBC: 0 % (ref 0.0–0.2)
Platelets: 211 10*3/uL (ref 150–400)
Platelets: 191 10*3/uL (ref 150–400)
RBC: 4.19 MIL/uL (ref 3.87–5.11)
RBC: 4.09 MIL/uL (ref 3.87–5.11)
RDW: 14.8 % (ref 11.5–15.5)
RDW: 14.7 % (ref 11.5–15.5)
WBC: 18.3 10*3/uL — ABNORMAL HIGH (ref 4.0–10.5)
WBC: 16.4 10*3/uL — ABNORMAL HIGH (ref 4.0–10.5)
nRBC: 0 % (ref 0.0–0.2)

## 2018-03-13 LAB — HEMOGLOBIN AND HEMATOCRIT, BLOOD
HCT: 37.1 % (ref 36.0–46.0)
HEMOGLOBIN: 12 g/dL (ref 12.0–15.0)

## 2018-03-13 LAB — GLUCOSE, CAPILLARY
Glucose-Capillary: 89 mg/dL (ref 70–99)
Glucose-Capillary: 85 mg/dL (ref 70–99)

## 2018-03-13 LAB — BASIC METABOLIC PANEL
Anion gap: 8 (ref 5–15)
BUN: 21 mg/dL (ref 8–23)
CO2: 23 mmol/L (ref 22–32)
Calcium: 8.5 mg/dL — ABNORMAL LOW (ref 8.9–10.3)
Chloride: 107 mmol/L (ref 98–111)
Creatinine, Ser: 0.87 mg/dL (ref 0.44–1.00)
GFR calc Af Amer: >60 mL/min (ref >60)
GFR calc non Af Amer: 57 mL/min — ABNORMAL LOW (ref >60)
Glucose, Bld: 97 mg/dL (ref 70–99)
Potassium: 3.7 mmol/L (ref 3.5–5.1)
Sodium: 138 mmol/L (ref 135–145)

## 2018-03-13 LAB — URINALYSIS, ROUTINE W REFLEX MICROSCOPIC
Bilirubin Urine: NEGATIVE
GLUCOSE, UA: NEGATIVE mg/dL
Hgb urine dipstick: NEGATIVE
Ketones, ur: NEGATIVE mg/dL
Leukocytes, UA: NEGATIVE
NITRITE: NEGATIVE
Protein, ur: NEGATIVE mg/dL
Specific Gravity, Urine: 1.017 (ref 1.005–1.030)
pH: 5 (ref 5.0–8.0)

## 2018-03-13 LAB — ABO/RH: ABO/RH(D): O POS

## 2018-03-13 MED ORDER — ALBUTEROL SULFATE (2.5 MG/3ML) 0.083% IN NEBU: 3.0000 mL | INHALATION_SOLUTION | Freq: Four times a day (QID) | RESPIRATORY_TRACT | Status: DC | PRN

## 2018-03-13 MED ORDER — SODIUM CHLORIDE 0.9 % IV BOLUS: 250.0000 mL | Freq: Once | INTRAVENOUS | Status: DC

## 2018-03-13 MED ORDER — SODIUM CHLORIDE 0.9 % IV BOLUS
500.0000 mL | Freq: Once | INTRAVENOUS | Status: AC
  Administered 2018-03-13: 500 mL via INTRAVENOUS

## 2018-03-13 MED ORDER — ACETAMINOPHEN 325 MG PO TABS: 650.0000 mg | ORAL_TABLET | Freq: Four times a day (QID) | ORAL | Status: DC | PRN

## 2018-03-13 MED ORDER — ONDANSETRON HCL 4 MG PO TABS: 4.0000 mg | ORAL_TABLET | Freq: Four times a day (QID) | ORAL | Status: DC | PRN

## 2018-03-13 MED ORDER — LEVOTHYROXINE SODIUM 25 MCG PO TABS
125.0000 ug | ORAL_TABLET | Freq: Every day | ORAL | Status: DC
  Administered 2018-03-13 – 2018-03-15 (×3): 125 ug via ORAL
  Filled 2018-03-13 (×3): qty 1

## 2018-03-13 MED ORDER — METRONIDAZOLE IN NACL 5-0.79 MG/ML-% IV SOLN
500.0000 mg | Freq: Three times a day (TID) | INTRAVENOUS | Status: DC
  Administered 2018-03-13 – 2018-03-15 (×7): 500 mg via INTRAVENOUS
  Filled 2018-03-13 (×7): qty 100

## 2018-03-13 MED ORDER — INSULIN ASPART 100 UNIT/ML ~~LOC~~ SOLN
0.0000 [IU] | SUBCUTANEOUS | Status: DC
  Administered 2018-03-14: 2 [IU] via SUBCUTANEOUS

## 2018-03-13 MED ORDER — ACETAMINOPHEN 650 MG RE SUPP: 650.0000 mg | Freq: Four times a day (QID) | RECTAL | Status: DC | PRN

## 2018-03-13 MED ORDER — CIPROFLOXACIN IN D5W 400 MG/200ML IV SOLN
400.0000 mg | Freq: Two times a day (BID) | INTRAVENOUS | Status: DC
  Administered 2018-03-13 (×2): 400 mg via INTRAVENOUS
  Filled 2018-03-13 (×3): qty 200

## 2018-03-13 MED ORDER — ONDANSETRON HCL 4 MG/2ML IJ SOLN: 4.0000 mg | Freq: Four times a day (QID) | INTRAMUSCULAR | Status: DC | PRN

## 2018-03-13 MED ORDER — PREDNISONE 5 MG PO TABS
10.0000 mg | ORAL_TABLET | Freq: Every day | ORAL | Status: AC
  Administered 2018-03-13 – 2018-03-15 (×3): 10 mg via ORAL
  Filled 2018-03-13 (×3): qty 2

## 2018-03-13 MED ORDER — INSULIN ASPART 100 UNIT/ML ~~LOC~~ SOLN: 0.0000 [IU] | SUBCUTANEOUS | Status: DC

## 2018-03-13 MED ORDER — PANTOPRAZOLE SODIUM 40 MG PO TBEC
40.0000 mg | DELAYED_RELEASE_TABLET | Freq: Every day | ORAL | Status: DC
  Administered 2018-03-13 – 2018-03-15 (×3): 40 mg via ORAL
  Filled 2018-03-13 (×3): qty 1

## 2018-03-13 MED ORDER — SODIUM CHLORIDE 0.9 % IV SOLN
INTRAVENOUS | Status: AC
  Administered 2018-03-13: 17:00:00 via INTRAVENOUS

## 2018-03-13 NOTE — Progress Notes (Signed)
Pharmacy Antibiotic Note  Shelia Black is a 85 y.o. female admitted on 03/12/2018 with intra-abdominal infection.  Pharmacy has been consulted for cipro dosing.  Plan: Cipro 400 mg IV q12h Flagyl 500 mg IV q8h (MD) F/u scr/cultures  Height: 5\' 4"  (162.6 cm) Weight: 143 lb 14.4 oz (65.3 kg) IBW/kg (Calculated) : 54.7  Temp (24hrs), Avg:98 F (36.7 C), Min:97.8 F (36.6 C), Max:98.1 F (36.7 C)  Recent Labs  Lab 03/12/18 2105  WBC 21.1*  CREATININE 0.86    Estimated Creatinine Clearance: 34.5 mL/min (by C-G formula based on SCr of 0.86 mg/dL).    Allergies  Allergen Reactions  . Codeine     Nausea and vomiting  . Percocet [Oxycodone-Acetaminophen] Nausea And Vomiting  . Penicillins Hives and Rash    Has patient had a PCN reaction causing immediate rash, facial/tongue/throat swelling, SOB or lightheadedness with hypotension: no Has patient had a PCN reaction causing severe rash involving mucus membranes or skin necrosis: unknown Has patient had a PCN reaction that required hospitalization : unknown Has patient had a PCN reaction occurring within the last 10 years: no If all of the above answers are "NO", then may proceed with Cephalosporin use.     Antimicrobials this admission: 2/7 cipro >>  2/7 flagyl >>   Dose adjustments this admission:   Microbiology results:  BCx:   UCx:    Sputum:    MRSA PCR:   Thank you for allowing pharmacy to be a part of this patient's care.  Lorenza Evangelist 03/13/2018 2:04 AM

## 2018-03-13 NOTE — Progress Notes (Signed)
Pharmacy Antibiotic Note  Shelia Black is a 85 y.o. female admitted on 03/12/2018 with colitis and rectal bleeding.  Pharmacy has been consulted for cipro dosing.  Plan: Cipro 400 mg IV q12h Flagyl 500 mg IV q8h (MD) No further dose adjustments anticipated, Pharmacy will sign off  Height: 5\' 4"  (162.6 cm) Weight: 142 lb 11.2 oz (64.7 kg) IBW/kg (Calculated) : 54.7  Temp (24hrs), Avg:98 F (36.7 C), Min:97.8 F (36.6 C), Max:98.1 F (36.7 C)  Recent Labs  Lab 03/12/18 2105 03/13/18 0204 03/13/18 0534  WBC 21.1* 18.3* 16.4*  CREATININE 0.86  --  0.87  LATICACIDVEN  --  2.0* 2.4*    Estimated Creatinine Clearance: 34.1 mL/min (by C-G formula based on SCr of 0.87 mg/dL).    Allergies  Allergen Reactions  . Codeine     Nausea and vomiting  . Percocet [Oxycodone-Acetaminophen] Nausea And Vomiting  . Penicillins Hives and Rash    Has patient had a PCN reaction causing immediate rash, facial/tongue/throat swelling, SOB or lightheadedness with hypotension: no Has patient had a PCN reaction causing severe rash involving mucus membranes or skin necrosis: unknown Has patient had a PCN reaction that required hospitalization : unknown Has patient had a PCN reaction occurring within the last 10 years: no If all of the above answers are "NO", then may proceed with Cephalosporin use.     Antimicrobials this admission: 2/7 cipro >>  2/7 flagyl >>   Dose adjustments this admission: None  Microbiology results: None  Thank you for allowing pharmacy to be a part of this patient's care.  Loralee Pacas, PharmD, BCPS Pager: [phone] 03/13/2018 8:32 AM

## 2018-03-13 NOTE — Progress Notes (Signed)
Lactic Acid 2.0, notified on call MD via page/text.

## 2018-03-13 NOTE — Progress Notes (Signed)
Tele monitoring strip verified with tele monitoring personnel by nurse Hilda Lias and NT KP at bedside.

## 2018-03-13 NOTE — ED Notes (Signed)
ED TO INPATIENT HANDOFF REPORT  Name/Age/Gender [name] 85 y.o. female  Code Status Code Status History    Date Active Date Inactive Code Status Order ID Comments User Context   05/23/2016 0304 05/27/2016 2032 Full Code [id number]  Lorretta Harp, MD ED   02/20/2015 2320 02/23/2015 1426 Full Code [id number]  Briscoe Deutscher, MD ED   04/29/2014 1157 05/01/2014 1700 Full Code [id number]  Alison Murray, MD ED      Home/SNF/Other Home  Chief Complaint abd pain, blood in stool  Level of Care/Admitting Diagnosis ED Disposition    ED Disposition Condition Comment   Admit  Hospital Area: [facility] [[id number]]  Level of Care: Telemetry [5]  Admit to tele based on following criteria: Monitor for Ischemic changes  Diagnosis: Colitis [086578]  Admitting Physician: Eduard Clos [phone]  Attending Physician: Eduard Clos [phone]  PT Class (Do Not Modify): Observation [104]  PT Acc Code (Do Not Modify): Observation [10022]       Medical History Past Medical History:  Diagnosis Date  . Arthritis   . Cataract   . Chronic cough   . COPD (chronic obstructive pulmonary disease) (HCC)   . Diabetes mellitus without complication (HCC)   . Diverticulitis   . Diverticulosis   . GERD (gastroesophageal reflux disease)   . Hepatic steatosis   . Hiatal hernia   . Hypothyroidism   . ILD (interstitial lung disease) (HCC)   . Pulmonary fibrosis (HCC)   . Thyroid disease   . Vertigo     Allergies Allergies  Allergen Reactions  . Codeine     Nausea and vomiting  . Percocet [Oxycodone-Acetaminophen] Nausea And Vomiting  . Penicillins Hives and Rash    Has patient had a PCN reaction causing immediate rash, facial/tongue/throat swelling, SOB or lightheadedness with hypotension: no Has patient had a PCN reaction causing severe rash involving mucus membranes or skin necrosis: unknown Has patient had a PCN reaction that required hospitalization : unknown Has  patient had a PCN reaction occurring within the last 10 years: no If all of the above answers are "NO", then may proceed with Cephalosporin use.     IV Location/Drains/Wounds Patient Lines/Drains/Airways Status   Active Line/Drains/Airways    Name:   Placement date:   Placement time:   Site:   Days:   Peripheral IV 03/12/18 Right Antecubital   03/12/18    2042    Antecubital   1          Labs/Imaging Results for orders placed or performed during the hospital encounter of 03/12/18 (from the past 48 hour(s))  ABO/Rh     Status: None (Preliminary result)   Collection Time: 03/12/18  9:03 PM  Result Value Ref Range   ABO/RH(D)      O POS Performed at [facility], [address] [place], [place] [address]   Comprehensive metabolic panel     Status: Abnormal   Collection Time: 03/12/18  9:05 PM  Result Value Ref Range   Sodium 138 135 - 145 mmol/L   Potassium 3.7 3.5 - 5.1 mmol/L   Chloride 105 98 - 111 mmol/L   CO2 26 22 - 32 mmol/L   Glucose, Bld 110 (H) 70 - 99 mg/dL   BUN 25 (H) 8 - 23 mg/dL   Creatinine, Ser 4.40 0.44 - 1.00 mg/dL   Calcium 9.3 8.9 - 10.2 mg/dL   Total Protein 7.3 6.5 - 8.1 g/dL   Albumin  3.9 3.5 - 5.0 g/dL   AST 19 15 - 41 U/L   ALT 18 0 - 44 U/L   Alkaline Phosphatase 54 38 - 126 U/L   Total Bilirubin 0.5 0.3 - 1.2 mg/dL   GFR calc non Af Amer 58 (L) >60 mL/min   GFR calc Af Amer >60 >60 mL/min   Anion gap 7 5 - 15    Comment: Performed at [facility], [address] [place], [place] [address]  Lipase, blood     Status: None   Collection Time: 03/12/18  9:05 PM  Result Value Ref Range   Lipase 29 11 - 51 U/L    Comment: Performed at [facility], [address] [place], [place] [address]  CBC WITH DIFFERENTIAL     Status: Abnormal   Collection Time: 03/12/18  9:05 PM  Result Value Ref Range   WBC 21.1 (H) 4.0 - 10.5 K/uL   RBC 4.44 3.87 - 5.11 MIL/uL   Hemoglobin 14.1 12.0 - 15.0  g/dL   HCT 09.843.5 11.936.0 - 14.746.0 %   MCV 98.0 80.0 - 100.0 fL   MCH 31.8 26.0 - 34.0 pg   MCHC 32.4 30.0 - 36.0 g/dL   RDW 82.914.6 56.211.5 - 13.015.5 %   Platelets 239 150 - 400 K/uL   nRBC 0.0 0.0 - 0.2 %   Neutrophils Relative % 75 %   Neutro Abs 15.9 (H) 1.7 - 7.7 K/uL   Lymphocytes Relative 15 %   Lymphs Abs 3.1 0.7 - 4.0 K/uL   Monocytes Relative 8 %   Monocytes Absolute 1.6 (H) 0.1 - 1.0 K/uL   Eosinophils Relative 0 %   Eosinophils Absolute 0.0 0.0 - 0.5 K/uL   Basophils Relative 0 %   Basophils Absolute 0.1 0.0 - 0.1 K/uL   Immature Granulocytes 2 %   Abs Immature Granulocytes 0.37 (H) 0.00 - 0.07 K/uL    Comment: Performed at [facility], [address] [place], [place] [address]  Type and screen [facility]     Status: None   Collection Time: 03/12/18  9:05 PM  Result Value Ref Range   ABO/RH(D) O POS    Antibody Screen NEG    Sample Expiration      03/15/2018 Performed at [facility], [address] [place], [place] [address]    Ct Abdomen Pelvis W Contrast  Result Date: 03/12/2018 CLINICAL DATA:  Low back pain, abdominal pain EXAM: CT ABDOMEN AND PELVIS WITH CONTRAST TECHNIQUE: Multidetector CT imaging of the abdomen and pelvis was performed using the standard protocol following bolus administration of intravenous contrast. CONTRAST:  100mL ISOVUE-300 IOPAMIDOL (ISOVUE-300) INJECTION 61% COMPARISON:  05/23/2016 FINDINGS: Lower chest: Bilateral lower lung chronic interstitial disease with fibrosis. Hepatobiliary: No focal liver abnormality is seen. No gallstones, gallbladder wall thickening, or biliary dilatation. Pancreas: Unremarkable. No pancreatic ductal dilatation or surrounding inflammatory changes. Spleen: Normal in size without focal abnormality. Adrenals/Urinary Tract: Adrenal glands are unremarkable. Kidneys are normal, without renal calculi, focal lesion, or hydronephrosis. Bladder is unremarkable. Stomach/Bowel:  Stomach is within normal limits. Incidental note made of a posterior gastric diverticulum. No pneumatosis, pneumoperitoneum or portal venous gas. No normal nor abnormal appendix is identified. Diverticulosis of the descending and sigmoid colon. Mild bowel wall thickening involving the transverse, descending and sigmoid colon concerning for mild colitis. Vascular/Lymphatic: Abdominal aortic atherosclerosis. No lymphadenopathy. Reproductive: Uterus and bilateral adnexa are unremarkable. Other: No abdominal wall hernia  or abnormality. No abdominopelvic ascites. Musculoskeletal: No acute osseous abnormality. No aggressive osseous lesion. Degenerative disc disease with disc height loss at L5-S1 with bilateral facet arthropathy. IMPRESSION: 1. Mild bowel wall thickening involving the transverse, descending and sigmoid colon concerning for mild colitis. 2. Diverticulosis of the descending and sigmoid colon. 3.  Aortic Atherosclerosis (ICD10-I70.0). Electronically Signed   By: Elige Ko   On: 03/12/2018 22:24   None  Pending Labs Unresulted Labs (From admission, onward)    Start     Ordered   03/12/18 1907  Urinalysis, Routine w reflex microscopic  ONCE - STAT,   STAT     03/12/18 1916          Vitals/Pain Today's Vitals   03/12/18 2100 03/12/18 2130 03/12/18 2333 03/13/18 0001  BP: (!) 141/60 138/60 128/60   Pulse: (!) 56 (!) 54 65   Resp: 16 (!) 26 (!) 21   Temp:      TempSrc:      SpO2: 94% 94% 95%   PainSc:    4     Isolation Precautions No active isolations  Medications Medications  0.9 %  sodium chloride infusion ( Intravenous New Bag/Given 03/12/18 2045)  iopamidol (ISOVUE-300) 61 % injection (has no administration in time range)  sodium chloride (PF) 0.9 % injection (has no administration in time range)  ciprofloxacin (CIPRO) IVPB 400 mg (has no administration in time range)  metroNIDAZOLE (FLAGYL) IVPB 500 mg (has no administration in time range)  fentaNYL (SUBLIMAZE) injection  12.5 mcg (has no administration in time range)  ondansetron (ZOFRAN) injection 4 mg (4 mg Intravenous Given 03/12/18 2041)  fentaNYL (SUBLIMAZE) injection 12.5 mcg (12.5 mcg Intravenous Given 03/12/18 2041)  iopamidol (ISOVUE-300) 61 % injection 100 mL (100 mLs Intravenous Contrast Given 03/12/18 2159)    Mobility walks with person assist

## 2018-03-13 NOTE — Progress Notes (Signed)
CRITICAL VALUE ALERT  Critical Value:  Lactic Acid 2.0  Date & Time Notied:  03/13/2018 1005  Provider Notified: Dr. Janee Morn   Orders Received/Actions taken:  Keep IVF going @ 175ml/hr

## 2018-03-13 NOTE — H&P (Signed)
History and Physical    Shelia Black PJA:[id number] DOB: March 21, 1921 DOA: 03/12/2018  PCP: [name], PA-C  Patient coming from: Home.  Chief Complaint: Abdominal pain and rectal bleeding.  HPI: [name] is a 85 y.o. female with history of hypothyroidism, diabetes mellitus, COPD and interstitial lung disease presents to the ER because of increasing lower abdominal pain with rectal bleeding last 3 days.  Patient states she has been having frank rectal bleeding last 3 days at times good volume.  Abdominal pain is constant across the lower abdominal quadrants.  Denies any vomiting fever chills.  Denies using any recent antibiotics or sick contacts.  ED Course: In the ER patient's hemoglobin is found to be around 14.  WBC count was 21.  CT abdomen and pelvis done shows colitis involving transverse descending and sigmoid colon.  Patient was admitted in April 2018 for diverticulitis.  Patient at this time is been a started on IV fluids pain relief medications Cipro and Flagyl.  On exam patient abdomen appears soft no rebound tenderness or rigidity.  Review of Systems: As per HPI, rest all negative.   Past Medical History:  Diagnosis Date  . Arthritis   . Cataract   . Chronic cough   . COPD (chronic obstructive pulmonary disease) ([place])   . Diabetes mellitus without complication ([place])   . Diverticulitis   . Diverticulosis   . GERD (gastroesophageal reflux disease)   . Hepatic steatosis   . Hiatal hernia   . Hypothyroidism   . ILD (interstitial lung disease) ([place])   . Pulmonary fibrosis ([place])   . Thyroid disease   . Vertigo     Past Surgical History:  Procedure Laterality Date  . APPENDECTOMY    . INTRAOCULAR LENS INSERTION  1997  . REPLACEMENT TOTAL KNEE BILATERAL    . SHOULDER SURGERY Right    tendonitis     reports that she has never smoked. She has never used smokeless tobacco. She reports that she does not drink alcohol or use drugs.  Allergies    Allergen Reactions  . Codeine     Nausea and vomiting  . Percocet [Oxycodone-Acetaminophen] Nausea And Vomiting  . Penicillins Hives and Rash    Has patient had a PCN reaction causing immediate rash, facial/tongue/throat swelling, SOB or lightheadedness with hypotension: no Has patient had a PCN reaction causing severe rash involving mucus membranes or skin necrosis: unknown Has patient had a PCN reaction that required hospitalization : unknown Has patient had a PCN reaction occurring within the last 10 years: no If all of the above answers are "NO", then may proceed with Cephalosporin use.     Family History  Problem Relation Age of Onset  . Diabetes Mother   . Kidney cancer Mother   . Hypertension Father   . Supraventricular tachycardia Father   . Skin cancer Father   . Diabetes Sister     Prior to Admission medications   Medication Sig Start Date End Date Taking? Authorizing Provider  ammonium lactate (LAC-HYDRIN) 12 % lotion Apply 1 application topically as needed for dry skin.   Yes [provider]  Calcium Carbonate (CALCIUM 600 PO) Take 1 tablet by mouth 2 (two) times daily.   Yes [provider]  chlorpheniramine-HYDROcodone (TUSSIONEX) 10-8 MG/5ML SUER Take 5 mLs by mouth every 12 (twelve) hours as needed for cough.  01/14/18  Yes [provider]  Cholecalciferol (VITAMIN D-3) 5000 units TABS Take 1 tablet by mouth daily.   Yes  [provider]  diphenoxylate-atropine (LOMOTIL) 2.5-0.025 MG tablet Take 1 tablet by mouth 4 (four) times daily as needed for diarrhea or loose stools. diarrhea 11/11/12  Yes [provider]  levothyroxine (SYNTHROID, LEVOTHROID) 125 MCG tablet Take 125 mcg by mouth daily before breakfast.   Yes [provider]  omeprazole (PRILOSEC) 20 MG capsule Take 20 mg by mouth daily.   Yes [provider]  predniSONE (DELTASONE) 20 MG tablet Take 2 tablets (40 mg total) by mouth daily. Patient  taking differently: Take 20 mg by mouth 2 (two) times daily with a meal.  03/01/18  Yes Davonna Belling, MD  Probiotic Product (ALIGN PO) Take 1 tablet by mouth daily.   Yes [provider]  vitamin C (ASCORBIC ACID) 500 MG tablet Take 500 mg by mouth daily.   Yes [provider]  acetaminophen (TYLENOL) 500 MG tablet Take 500 mg by mouth every 6 (six) hours as needed for moderate pain. 1 in the morning and 2 at night    [provider]  albuterol (PROVENTIL HFA;VENTOLIN HFA) 108 (90 Base) MCG/ACT inhaler Inhale 1 puff into the lungs every 6 (six) hours as needed for wheezing or shortness of breath. 05/27/16   Doreatha Lew, MD  ciprofloxacin (CIPRO) 500 MG tablet Take 1 tab twice daily with food. Patient not taking: Reported on 03/12/2018 05/30/17   Esterwood, Amy S, PA-C  ibuprofen (ADVIL,MOTRIN) 600 MG tablet Take 1 tablet (600 mg total) by mouth every 6 (six) hours as needed for headache. Patient not taking: Reported on 03/12/2018 05/27/16   Patrecia Pour, Christean Grief, MD  methocarbamol (ROBAXIN) 500 MG tablet Take 1 tablet (500 mg total) by mouth every 8 (eight) hours as needed for muscle spasms. Patient not taking: Reported on 03/12/2018 02/28/18   Davonna Belling, MD  metroNIDAZOLE (FLAGYL) 500 MG tablet Take 1 tab by mouth with food for 7 days. Patient not taking: Reported on 03/12/2018 05/30/17   Esterwood, Amy S, PA-C  mirtazapine (REMERON) 30 MG tablet Take 1 tablet (30 mg total) by mouth at bedtime. Patient not taking: Reported on 03/12/2018 05/27/16   Patrecia Pour, Christean Grief, MD    Physical Exam: Vitals:   03/12/18 2100 03/12/18 2130 03/12/18 2333 03/13/18 0030  BP: (!) 141/60 138/60 128/60 (!) 144/68  Pulse: (!) 56 (!) 54 65 60  Resp: 16 (!) 26 (!) 21 18  Temp:    98.1 F (36.7 C)  TempSrc:    Oral  SpO2: 94% 94% 95% 96%  Weight:    65.3 kg  Height:    5' 4"  (1.626 m)      Constitutional: Moderately built and nourished. Vitals:   03/12/18 2100 03/12/18 2130  03/12/18 2333 03/13/18 0030  BP: (!) 141/60 138/60 128/60 (!) 144/68  Pulse: (!) 56 (!) 54 65 60  Resp: 16 (!) 26 (!) 21 18  Temp:    98.1 F (36.7 C)  TempSrc:    Oral  SpO2: 94% 94% 95% 96%  Weight:    65.3 kg  Height:    5' 4"  (1.626 m)   Eyes: Anicteric no pallor. ENMT: No discharge from the ears eyes nose and mouth. Neck: No mass or.  No neck rigidity. Respiratory: No rhonchi or crepitations. Cardiovascular: S1-S2 heard. Abdomen: Soft nontender bowel sounds present. Musculoskeletal: No edema.  No joint effusion. Skin: No rash. Neurologic: Alert awake oriented to time place and person.  Hard of hearing.  Moves all extremities. Psychiatric: Appears normal.  Normal affect.  Labs on Admission: I have personally reviewed following labs and imaging studies  CBC: Recent Labs  Lab 03/12/18 2105  WBC 21.1*  NEUTROABS 15.9*  HGB 14.1  HCT 43.5  MCV 98.0  PLT 147   Basic Metabolic Panel: Recent Labs  Lab 03/12/18 2105  NA 138  K 3.7  CL 105  CO2 26  GLUCOSE 110*  BUN 25*  CREATININE 0.86  CALCIUM 9.3   GFR: Estimated Creatinine Clearance: 34.5 mL/min (by C-G formula based on SCr of 0.86 mg/dL). Liver Function Tests: Recent Labs  Lab 03/12/18 2105  AST 19  ALT 18  ALKPHOS 54  BILITOT 0.5  PROT 7.3  ALBUMIN 3.9   Recent Labs  Lab 03/12/18 2105  LIPASE 29   No results for input(s): AMMONIA in the last 168 hours. Coagulation Profile: No results for input(s): INR, PROTIME in the last 168 hours. Cardiac Enzymes: No results for input(s): CKTOTAL, CKMB, CKMBINDEX, TROPONINI in the last 168 hours. BNP (last 3 results) No results for input(s): PROBNP in the last 8760 hours. HbA1C: No results for input(s): HGBA1C in the last 72 hours. CBG: No results for input(s): GLUCAP in the last 168 hours. Lipid Profile: No results for input(s): CHOL, HDL, LDLCALC, TRIG, CHOLHDL, LDLDIRECT in the last 72 hours. Thyroid Function Tests: No results for input(s): TSH,  T4TOTAL, FREET4, T3FREE, THYROIDAB in the last 72 hours. Anemia Panel: No results for input(s): VITAMINB12, FOLATE, FERRITIN, TIBC, IRON, RETICCTPCT in the last 72 hours. Urine analysis:    Component Value Date/Time   COLORURINE AMBER (A) 05/22/2016 2204   APPEARANCEUR CLOUDY (A) 05/22/2016 2204   LABSPEC 1.015 05/22/2016 2204   PHURINE 6.0 05/22/2016 2204   GLUCOSEU NEGATIVE 05/22/2016 2204   HGBUR LARGE (A) 05/22/2016 2204   BILIRUBINUR NEGATIVE 05/22/2016 2204   KETONESUR 5 (A) 05/22/2016 2204   PROTEINUR 30 (A) 05/22/2016 2204   UROBILINOGEN 0.2 04/29/2014 0856   NITRITE NEGATIVE 05/22/2016 2204   LEUKOCYTESUR LARGE (A) 05/22/2016 2204   Sepsis Labs: @LABRCNTIP (procalcitonin:4,lacticidven:4) )No results found for this or any previous visit (from the past 240 hour(s)).   Radiological Exams on Admission: Ct Abdomen Pelvis W Contrast  Result Date: 03/12/2018 CLINICAL DATA:  Low back pain, abdominal pain EXAM: CT ABDOMEN AND PELVIS WITH CONTRAST TECHNIQUE: Multidetector CT imaging of the abdomen and pelvis was performed using the standard protocol following bolus administration of intravenous contrast. CONTRAST:  164m ISOVUE-300 IOPAMIDOL (ISOVUE-300) INJECTION 61% COMPARISON:  05/23/2016 FINDINGS: Lower chest: Bilateral lower lung chronic interstitial disease with fibrosis. Hepatobiliary: No focal liver abnormality is seen. No gallstones, gallbladder wall thickening, or biliary dilatation. Pancreas: Unremarkable. No pancreatic ductal dilatation or surrounding inflammatory changes. Spleen: Normal in size without focal abnormality. Adrenals/Urinary Tract: Adrenal glands are unremarkable. Kidneys are normal, without renal calculi, focal lesion, or hydronephrosis. Bladder is unremarkable. Stomach/Bowel: Stomach is within normal limits. Incidental note made of a posterior gastric diverticulum. No pneumatosis, pneumoperitoneum or portal venous gas. No normal nor abnormal appendix is identified.  Diverticulosis of the descending and sigmoid colon. Mild bowel wall thickening involving the transverse, descending and sigmoid colon concerning for mild colitis. Vascular/Lymphatic: Abdominal aortic atherosclerosis. No lymphadenopathy. Reproductive: Uterus and bilateral adnexa are unremarkable. Other: No abdominal wall hernia or abnormality. No abdominopelvic ascites. Musculoskeletal: No acute osseous abnormality. No aggressive osseous lesion. Degenerative disc disease with disc height loss at L5-S1 with bilateral facet arthropathy. IMPRESSION: 1. Mild bowel wall thickening involving the transverse, descending and sigmoid colon concerning for mild colitis. 2. Diverticulosis  of the descending and sigmoid colon. 3.  Aortic Atherosclerosis (ICD10-I70.0). Electronically Signed   By: Kathreen Devoid   On: 03/12/2018 22:24     Assessment/Plan Principal Problem:   Colitis with rectal bleeding Active Problems:   Diabetes mellitus type 2 in obese (HCC)   Pulmonary fibrosis ([place])   Colitis    1. Colitis with rectal bleeding -differentials include ischemic versus infectious.  Will check lactate level continue with gentle hydration keep n.p.o. since patient has been having some rectal bleeding.  Patient has been on IV Cipro and Flagyl.  Check serial CBCs.  I do not see any colonoscopy report in the chart.  May discuss with gastroenterologist in the morning. 2. Hypothyroidism on Synthroid. 3. Diabetes mellitus type 2 not on any medications at this time.  We will keep patient on sliding scale coverage. 4. History of COPD and interstitial lung disease not currently wheezing.  Needs to verify if patient has been on long-term steroids for this.   DVT prophylaxis: SCDs. Code Status: Full code. Family Communication: Discussed with patient. Disposition Plan: Home. Consults called: None. Admission status: Observation.   Rise Patience MD [facility] Pager [phone].  If 7PM-7AM, please contact  night-coverage [email or web address] Password [email or web address]  03/13/2018, 1:42 AM

## 2018-03-13 NOTE — Progress Notes (Signed)
I have seen and assessed patient and agree with Dr. Moise Boring assessment and plan.  Patient is a pleasant 85 year old female history of hypothyroidism, diabetes, COPD with interstitial lung disease presented to the ED with lower abdominal pain and rectal bleeding x3 days.  CT abdomen and pelvis which was done showed colitis involving transverse, descending and sigmoid colon.  Patient admitted with concerns for ischemic colitis versus infectious colitis.  Patient placed empirically on IV ciprofloxacin and Flagyl.  Will consult with GI for further evaluation and management.  No charge.

## 2018-03-13 NOTE — Consult Note (Addendum)
Referring Provider: Dr. Irine Seal Primary Care Physician:  [name], PA-C Primary Gastroenterologist: Nicoletta Ba, PA-C  Reason for Consultation: abdominal pain, hematochezia  HPI: Shelia Black is a 85 y.o. female with a history of COPD, DM, hypothyroidism, diverticulitis, urosepsis and arthritis who presented to the ED with complaints of BRBPR, abdominal pain and diarrhea. She first noticed bright red blood per the rectum 4 or 5 days ago. The first few days she saw a moderate amount of red blood in the toilet when passing a loose stool and sometimes passed only blood per the rectum. LLQ discomfort started 2 days ago. No fever, sweats of chills. She reports having frequent loose mud like stools daily for the past few years. Occasionally passes a normal solid stool, maybe once weekly. No history of colitis. History of acute mid sigmoid diverticulitis confirmed by CT  04/2014.  Acute sigmoid diverticulitis 05/2016 which required hospital admission in setting of concurrent urosepsis. She was seen in by Nicoletta Ba PA-C as an outpatient 05/30/17 with lower abdominal pain, treated with Cipro and Flagyl po x 7 days for probable diverticulitis. Her abdominal pain resolved. Past colonoscopies done 10+ years ago, possibly had colon polyps. She was seen in the ED 02/2027 with arthritic neck pain. She reported taking Ibuprofen 4 tabs twice daily for about 1 week prior to going to the ED. Her daughter, [name], reported her mother  was seen by her orthopedist on  03/07/2018 and she was prescribed a Prednisone taper which she continued until coming to the ED 03/12/2018. She is not taking any NSAIDS while on Prednisone. No family history of IBD. No recent antibiotics. She takes Electronics engineer probiotic.   ED Course: WBC 18.3. Hg 13.3, 13.0. Hct 41.6, 41.1. MCV 99.3. PLT 211. Na 138. K 3.7. BUN 21. Cr. 0.87. Lactic Acid 2.0.  Abdominal/pelivc CT with contrast 03/12/2018 showed mild bowel wall thickening  involving the transverse, descending and sigmoid colon concerning for mild colitis and diverticulosis to the descending and sigmoid colon. No evidence of diverticulitis, a posterior gastric diverticulum noted and bilateral lower lung chronic interstitial disease with fibrosis. She was given Cipro and Flagyl IV . GI panel and UA, C&S ordered, specimens not yet received.    Past Medical History:  Diagnosis Date  . Arthritis   . Cataract   . Chronic cough   . COPD (chronic obstructive pulmonary disease) ([place])   . Diabetes mellitus without complication ([place])   . Diverticulitis   . Diverticulosis   . GERD (gastroesophageal reflux disease)   . Hepatic steatosis   . Hiatal hernia   . Hypothyroidism   . ILD (interstitial lung disease) ([place])   . Pulmonary fibrosis ([place])   . Thyroid disease   . Vertigo     Past Surgical History:  Procedure Laterality Date  . APPENDECTOMY    . INTRAOCULAR LENS INSERTION  1997  . REPLACEMENT TOTAL KNEE BILATERAL    . SHOULDER SURGERY Right    tendonitis    Prior to Admission medications   Medication Sig Start Date End Date Taking? Authorizing Provider  ammonium lactate (LAC-HYDRIN) 12 % lotion Apply 1 application topically as needed for dry skin.   Yes [provider]  Calcium Carbonate (CALCIUM 600 PO) Take 1 tablet by mouth 2 (two) times daily.   Yes [provider]  chlorpheniramine-HYDROcodone (TUSSIONEX) 10-8 MG/5ML SUER Take 5 mLs by mouth every 12 (twelve) hours as needed for cough.  01/14/18  Yes [provider]  Cholecalciferol (VITAMIN  D-3) 5000 units TABS Take 1 tablet by mouth daily.   Yes [provider]  diphenoxylate-atropine (LOMOTIL) 2.5-0.025 MG tablet Take 1 tablet by mouth 4 (four) times daily as needed for diarrhea or loose stools. diarrhea 11/11/12  Yes [provider]  levothyroxine (SYNTHROID, LEVOTHROID) 125 MCG tablet Take 125 mcg by mouth daily before breakfast.   Yes [provider]  omeprazole (PRILOSEC) 20 MG capsule Take 20 mg by mouth daily.   Yes [provider]  predniSONE (DELTASONE) 20 MG tablet Take 2 tablets (40 mg total) by mouth daily. Patient taking differently: Take 20 mg by mouth 2 (two) times daily with a meal.  03/01/18  Yes Davonna Belling, MD  Probiotic Product (ALIGN PO) Take 1 tablet by mouth daily.   Yes [provider]  vitamin C (ASCORBIC ACID) 500 MG tablet Take 500 mg by mouth daily.   Yes [provider]  acetaminophen (TYLENOL) 500 MG tablet Take 500 mg by mouth every 6 (six) hours as needed for moderate pain. 1 in the morning and 2 at night    [provider]  albuterol (PROVENTIL HFA;VENTOLIN HFA) 108 (90 Base) MCG/ACT inhaler Inhale 1 puff into the lungs every 6 (six) hours as needed for wheezing or shortness of breath. 05/27/16   Doreatha Lew, MD  ciprofloxacin (CIPRO) 500 MG tablet Take 1 tab twice daily with food. Patient not taking: Reported on 03/12/2018 05/30/17   [place], Amy S, PA-C  ibuprofen (ADVIL,MOTRIN) 600 MG tablet Take 1 tablet (600 mg total) by mouth every 6 (six) hours as needed for headache. Patient not taking: Reported on 03/12/2018 05/27/16   Patrecia Pour, Christean Grief, MD  methocarbamol (ROBAXIN) 500 MG tablet Take 1 tablet (500 mg total) by mouth every 8 (eight) hours as needed for muscle spasms. Patient not taking: Reported on 03/12/2018 02/28/18   Davonna Belling, MD  metroNIDAZOLE (FLAGYL) 500 MG tablet Take 1 tab by mouth with food for 7 days. Patient not taking: Reported on 03/12/2018 05/30/17   Esterwood, Amy S, PA-C  mirtazapine (REMERON) 30 MG tablet Take 1 tablet (30 mg total) by mouth at bedtime. Patient not taking: Reported on 03/12/2018 05/27/16   Doreatha Lew, MD    Current Facility-Administered Medications  Medication Dose Route Frequency Provider Last Rate Last Dose  . 0.9 %  sodium chloride infusion   Intravenous Continuous Rise Patience, MD 100 mL/hr  at 03/13/18 0345    . acetaminophen (TYLENOL) tablet 650 mg  650 mg Oral Q6H PRN Rise Patience, MD       Or  . acetaminophen (TYLENOL) suppository 650 mg  650 mg Rectal Q6H PRN Rise Patience, MD      . albuterol (PROVENTIL) (2.5 MG/3ML) 0.083% nebulizer solution 3 mL  3 mL Inhalation Q6H PRN Rise Patience, MD      . ciprofloxacin (CIPRO) IVPB 400 mg  400 mg Intravenous Q12H Dorrene German, [facility]      . insulin aspart (novoLOG) injection 0-9 Units  0-9 Units Subcutaneous Q4H Eugenie Filler, MD      . levothyroxine (SYNTHROID, LEVOTHROID) tablet 125 mcg  125 mcg Oral QAC breakfast Rise Patience, MD   125 mcg at 03/13/18 0831  . metroNIDAZOLE (FLAGYL) IVPB 500 mg  500 mg Intravenous Q8H Rise Patience, MD 100 mL/hr at 03/13/18 0835 500 mg at 03/13/18 0835  . ondansetron (ZOFRAN) tablet 4 mg  4 mg Oral Q6H PRN Rise Patience,  MD       Or  . ondansetron (ZOFRAN) injection 4 mg  4 mg Intravenous Q6H PRN Rise Patience, MD      . pantoprazole (PROTONIX) EC tablet 40 mg  40 mg Oral Daily Rise Patience, MD   40 mg at 03/13/18 0831  . sodium chloride 0.9 % bolus 250 mL  250 mL Intravenous Once Schorr, Rhetta Mura, NP 245.9 mL/hr at 03/13/18 0248    . sodium chloride 0.9 % bolus 250 mL  250 mL Intravenous Once Rise Patience, MD        Allergies as of 03/12/2018 - Review Complete 03/12/2018  Allergen Reaction Noted  . Codeine  02/20/2015  . Percocet [oxycodone-acetaminophen] Nausea And Vomiting 04/29/2014  . Penicillins Hives and Rash 04/29/2014    Family History  Problem Relation Age of Onset  . Diabetes Mother   . Kidney cancer Mother   . Hypertension Father   . Supraventricular tachycardia Father   . Skin cancer Father   . Diabetes Sister     Social History   Socioeconomic History  . Marital status: Widowed    Spouse name: Not on file  . Number of children: 2  . Years of education: Not on file  . Highest education  level: Not on file  Occupational History  . Occupation: retired  Scientific laboratory technician  . Financial resource strain: Not on file  . Food insecurity:    Worry: Not on file    Inability: Not on file  . Transportation needs:    Medical: Not on file    Non-medical: Not on file  Tobacco Use  . Smoking status: Never Smoker  . Smokeless tobacco: Never Used  Substance and Sexual Activity  . Alcohol use: No  . Drug use: No  . Sexual activity: Not Currently    Birth control/protection: None  Lifestyle  . Physical activity:    Days per week: Not on file    Minutes per session: Not on file  . Stress: Not on file  Relationships  . Social connections:    Talks on phone: Not on file    Gets together: Not on file    Attends religious service: Not on file    Active member of club or organization: Not on file    Attends meetings of clubs or organizations: Not on file    Relationship status: Not on file  . Intimate partner violence:    Fear of current or ex partner: Not on file    Emotionally abused: Not on file    Physically abused: Not on file    Forced sexual activity: Not on file  Other Topics Concern  . Not on file  Social History Narrative  . Not on file    Review of Systems: See HPI, all other systems negative.  Physical Exam: Vital signs in last 24 hours: Temp:  [97.8 F (36.6 C)-98.1 F (36.7 C)] 98 F (36.7 C) (02/07 0502) Pulse Rate:  [50-65] 54 (02/07 1057) Resp:  [16-36] 16 (02/07 0502) BP: (115-154)/(52-69) 115/58 (02/07 1057) SpO2:  [93 %-100 %] 93 % (02/07 1057) Weight:  [64.7 kg-65.3 kg] 64.7 kg (02/07 0457) Last BM Date: 03/13/18 General: 85 year old female in NAD, HOH   Lungs:   Clear throughout Heart:  Irregular rhythm, no murmur. Abdomen: soft, nondistended, mild LLQ tenderness without rebound or  Guarding, + BS x 4 quads  Rectal:  Small external hemorrhoids, small hemorrhoid tag/nodule, no blood or stool  in rectal vault. Extremities: no edema Neurologic:   Alert and  oriented x4;  grossly normal neurologically. Psych:  Alert, fatigued appearing  Intake/Output from previous day: 02/06 0701 - 02/07 0700 In: 1050 [I.V.:750; IV Piggyback:300] Out: -  Intake/Output this shift: No intake/output data recorded.  Lab Results: Recent Labs    03/12/18 2105 03/13/18 0204 03/13/18 0534  WBC 21.1* 18.3* 16.4*  HGB 14.1 13.3 13.0  HCT 43.5 41.6 41.1  PLT 239 211 191   BMET Recent Labs    03/12/18 2105 03/13/18 0534  NA 138 138  K 3.7 3.7  CL 105 107  CO2 26 23  GLUCOSE 110* 97  BUN 25* 21  CREATININE 0.86 0.87  CALCIUM 9.3 8.5*   LFT Recent Labs    03/12/18 2105  PROT 7.3  ALBUMIN 3.9  AST 19  ALT 18  ALKPHOS 54  BILITOT 0.5    Studies/Results: Ct Abdomen Pelvis W Contrast  Result Date: 03/12/2018 CLINICAL DATA:  Low back pain, abdominal pain EXAM: CT ABDOMEN AND PELVIS WITH CONTRAST TECHNIQUE: Multidetector CT imaging of the abdomen and pelvis was performed using the standard protocol following bolus administration of intravenous contrast. CONTRAST:  131m ISOVUE-300 IOPAMIDOL (ISOVUE-300) INJECTION 61% COMPARISON:  05/23/2016 FINDINGS: Lower chest: Bilateral lower lung chronic interstitial disease with fibrosis. Hepatobiliary: No focal liver abnormality is seen. No gallstones, gallbladder wall thickening, or biliary dilatation. Pancreas: Unremarkable. No pancreatic ductal dilatation or surrounding inflammatory changes. Spleen: Normal in size without focal abnormality. Adrenals/Urinary Tract: Adrenal glands are unremarkable. Kidneys are normal, without renal calculi, focal lesion, or hydronephrosis. Bladder is unremarkable. Stomach/Bowel: Stomach is within normal limits. Incidental note made of a posterior gastric diverticulum. No pneumatosis, pneumoperitoneum or portal venous gas. No normal nor abnormal appendix is identified. Diverticulosis of the descending and sigmoid colon. Mild bowel wall thickening involving the transverse,  descending and sigmoid colon concerning for mild colitis. Vascular/Lymphatic: Abdominal aortic atherosclerosis. No lymphadenopathy. Reproductive: Uterus and bilateral adnexa are unremarkable. Other: No abdominal wall hernia or abnormality. No abdominopelvic ascites. Musculoskeletal: No acute osseous abnormality. No aggressive osseous lesion. Degenerative disc disease with disc height loss at L5-S1 with bilateral facet arthropathy. IMPRESSION: 1. Mild bowel wall thickening involving the transverse, descending and sigmoid colon concerning for mild colitis. 2. Diverticulosis of the descending and sigmoid colon. 3.  Aortic Atherosclerosis (ICD10-I70.0). Electronically Signed   By: HKathreen Devoid  On: 03/12/2018 22:24    IMPRESSION/PLAN:  1) Ischemic colitis verses infectious colitis An abdominal/pelvic CT with contrast 03/12/2018 showed mild bowel wall thickening involving the transverse, descending and sigmoid colon concerning for mild colitis and diverticulosis to the descending and sigmoid colon. No evidence of diverticulitis. -consider GI panel and C. Diff PCR -H/H is stable, no further rectal bleeding, continue to monitor  -continue Cipro and Flagyl IV for now -No NSAIDS -Clear liquid diet -continue IV fluid  2) Hematochezia secondary to # 1, patient has external hemorrhoids without evidence of active bleeding at time of exam -monitor CBC in am -monitor rectal bleeding   3) Leukocytosis in setting of current Prednisone taper (prescribed by ortho for neck pain) -await UA and C& S -consider GI panel, C. Diff PCR   Further GI recommendations per Dr. DJoseph PieriniMDorathy Daft 03/13/2018, 10:59 AM   I have reviewed the entire case in detail with the above APP and discussed the plan in detail.  Therefore, I agree with the diagnoses recorded above. In addition,  I have personally interviewed  and examined the patient and have personally reviewed any abdominal/pelvic CT scan images.  My  additional thoughts are as follows:  Pleasant 85 year old woman with several days of low volume rectal bleeding escalating to left-sided abdominal pain with an overall clinical picture most consistent with ischemic colitis.  She looks well at this point, with improving leukocytosis and normal hemoglobin and renal function.   She has no tenderness on exam and says her pain has resolved.  I will start a low residue diet, she should receive a total of 3 to 5 days of antibiotic to decrease the chance of bacterial translocation and sepsis from ischemic colitis.  Stool studies for infectious pathogens are pending, seem a less likely primary cause.  If she continues to improve, she will most likely be able to go home over the weekend.  Her daughter was at the bedside for the entire encounter and all questions were answered with a full clinical update.   Nelida Meuse III Office:[phone]

## 2018-03-14 DIAGNOSIS — K921 Melena: Secondary | ICD-10-CM

## 2018-03-14 DIAGNOSIS — K559 Vascular disorder of intestine, unspecified: Secondary | ICD-10-CM | POA: Diagnosis present

## 2018-03-14 DIAGNOSIS — J449 Chronic obstructive pulmonary disease, unspecified: Secondary | ICD-10-CM

## 2018-03-14 DIAGNOSIS — R1032 Left lower quadrant pain: Secondary | ICD-10-CM

## 2018-03-14 LAB — COMPREHENSIVE METABOLIC PANEL
ALT: 13 U/L (ref 0–44)
AST: 15 U/L (ref 15–41)
Albumin: 2.8 g/dL — ABNORMAL LOW (ref 3.5–5.0)
Alkaline Phosphatase: 42 U/L (ref 38–126)
Anion gap: 8 (ref 5–15)
BUN: 17 mg/dL (ref 8–23)
CALCIUM: 8.2 mg/dL — AB (ref 8.9–10.3)
CO2: 22 mmol/L (ref 22–32)
Chloride: 108 mmol/L (ref 98–111)
Creatinine, Ser: 1.06 mg/dL — ABNORMAL HIGH (ref 0.44–1.00)
GFR calc Af Amer: 52 mL/min — ABNORMAL LOW (ref >60)
GFR calc non Af Amer: 45 mL/min — ABNORMAL LOW (ref >60)
Glucose, Bld: 166 mg/dL — ABNORMAL HIGH (ref 70–99)
Potassium: 4.1 mmol/L (ref 3.5–5.1)
Sodium: 138 mmol/L (ref 135–145)
Total Bilirubin: 0.7 mg/dL (ref 0.3–1.2)
Total Protein: 5.4 g/dL — ABNORMAL LOW (ref 6.5–8.1)

## 2018-03-14 LAB — GLUCOSE, CAPILLARY
Glucose-Capillary: 154 mg/dL — ABNORMAL HIGH (ref 70–99)
Glucose-Capillary: 148 mg/dL — ABNORMAL HIGH (ref 70–99)
Glucose-Capillary: 234 mg/dL — ABNORMAL HIGH (ref 70–99)
Glucose-Capillary: 120 mg/dL — ABNORMAL HIGH (ref 70–99)

## 2018-03-14 LAB — CBC WITH DIFFERENTIAL/PLATELET
Abs Immature Granulocytes: 0.15 10*3/uL — ABNORMAL HIGH (ref 0.00–0.07)
Basophils Absolute: 0 10*3/uL (ref 0.0–0.1)
Basophils Relative: 0 %
EOS PCT: 0 %
Eosinophils Absolute: 0 10*3/uL (ref 0.0–0.5)
HCT: 36.5 % (ref 36.0–46.0)
Hemoglobin: 11.7 g/dL — ABNORMAL LOW (ref 12.0–15.0)
Immature Granulocytes: 1 %
Lymphocytes Relative: 12 %
Lymphs Abs: 1.5 10*3/uL (ref 0.7–4.0)
MCH: 32.2 pg (ref 26.0–34.0)
MCHC: 32.1 g/dL (ref 30.0–36.0)
MCV: 100.6 fL — ABNORMAL HIGH (ref 80.0–100.0)
MONO ABS: 0.6 10*3/uL (ref 0.1–1.0)
Monocytes Relative: 4 %
Neutro Abs: 10.3 10*3/uL — ABNORMAL HIGH (ref 1.7–7.7)
Neutrophils Relative %: 83 %
Platelets: 197 10*3/uL (ref 150–400)
RBC: 3.63 MIL/uL — ABNORMAL LOW (ref 3.87–5.11)
RDW: 15 % (ref 11.5–15.5)
WBC: 12.5 10*3/uL — ABNORMAL HIGH (ref 4.0–10.5)
nRBC: 0 % (ref 0.0–0.2)

## 2018-03-14 LAB — LACTIC ACID, PLASMA: Lactic Acid, Venous: 1.4 mmol/L (ref 0.5–1.9)

## 2018-03-14 MED ORDER — INSULIN ASPART 100 UNIT/ML ~~LOC~~ SOLN
0.0000 [IU] | Freq: Three times a day (TID) | SUBCUTANEOUS | Status: DC
  Administered 2018-03-14: 3 [IU] via SUBCUTANEOUS
  Administered 2018-03-14: 1 [IU] via SUBCUTANEOUS
  Administered 2018-03-15: 2 [IU] via SUBCUTANEOUS

## 2018-03-14 MED ORDER — CIPROFLOXACIN IN D5W 400 MG/200ML IV SOLN
400.0000 mg | INTRAVENOUS | Status: DC
  Administered 2018-03-14: 400 mg via INTRAVENOUS
  Filled 2018-03-14: qty 200

## 2018-03-14 MED ORDER — SODIUM CHLORIDE 0.9 % IV SOLN
INTRAVENOUS | Status: DC
  Administered 2018-03-14 – 2018-03-15 (×2): via INTRAVENOUS

## 2018-03-14 NOTE — Progress Notes (Signed)
PROGRESS NOTE    Shelia Black  VVO:[id number] DOB: 01-26-1922 DOA: 03/12/2018 PCP: [name], PA-C   Brief Narrative:  Patient is a 85 year old female history of hypothyroidism, diabetes, COPD with interstitial lung disease presented to the ED with increasing lower abdominal pain and rectal bleeding x3 days.  CT abdomen and pelvis which was done showed colitis involving transverse descending and sigmoid colon.  Patient placed empirically on IV ciprofloxacin and Flagyl.  GI consulted and patient being treated for ischemic colitis.   Assessment & Plan:   Principal Problem:   Colitis with rectal bleeding Active Problems:   Ischemic colitis ([place])   Diabetes mellitus type 2 in obese ([place])   Pulmonary fibrosis ([place])   Colitis  1 colitis with rectal bleeding likely secondary to ischemic colitis versus infectious colitis Patient presented with abdominal pain and rectal bleeding x3 days prior to admission.  CT abdomen and pelvis done consistent with mild colitis involving the transverse descending and sigmoid colon.  Patient noted to have blood pressure to be borderline and likely secondary to ischemic colitis.  Stool studies were ordered however patient with no bowel movement and as such unable to obtain stool studies.  Patient has been seen in consultation by gastroenterology who are recommending continued supportive treatment, continue empiric IV antibiotics of ciprofloxacin and Flagyl.  If patient has a bowel movement and stools are able to be collected stool studies have been ordered.  Patient will likely need continuation of antibiotics for total of 5 days to decrease chance of bacterial translocation and sepsis from ischemic colitis.  Patient blood pressure noted to be borderline and as such we will place on gentle hydration with IV fluids.  Diet has been advanced to a low residue diet.  Continue supportive care.  Follow.  2.  Diabetes mellitus type 2 Patient with a blood glucose  of 166 this morning.  Continue sliding scale insulin.  Follow.  3.  Borderline blood pressure To have a blood pressure of 106/45 this morning.  Patient admitted for ischemic colitis.  Patient not on any antihypertensive medications.  Place on gentle hydration with IV fluids.  Follow.  4.  Hypothyroidism Continue home dose Synthroid.  5.  History of COPD and interstitial lung disease Currently stable.  Patient was on prednisone 10 mg daily which has been resumed.   DVT prophylaxis: SCDs Code Status: Full Family Communication: Updated patient.  No family at bedside. Disposition Plan: Likely home tomorrow if continued improvement clinically and improvement with blood pressure.   Consultants:   Gastroenterology: Dr.Danis III 03/13/2018  Procedures:   CT abdomen and pelvis 03/12/2018  Antimicrobials:  IV ciprofloxacin 03/13/2018  IV Flagyl 03/13/2018   Subjective: Sleeping however easily arousable.  Denies any chest pain.  No shortness of breath.  Denies any further abdominal pain.  Denies any further rectal bleeding.  Tolerating current oral intake.  Blood pressure noted to be borderline this morning.  Objective: Vitals:   03/13/18 1057 03/13/18 1300 03/13/18 2220 03/14/18 0430  BP: (!) 115/58 117/61 (!) 114/56 (!) 106/45  Pulse: (!) 54 (!) 59 64 62  Resp:  16 18 18   Temp:  98.1 F (36.7 C) 98.3 F (36.8 C) 97.7 F (36.5 C)  TempSrc:  Oral  Oral  SpO2: 93% 95% 90% 92%  Weight:    65.4 kg  Height:        Intake/Output Summary (Last 24 hours) at 03/14/2018 0956 Last data filed at 03/14/2018 0901 Gross per 24 hour  Intake  3183.8 ml  Output 100 ml  Net 3083.8 ml   Filed Weights   03/13/18 0030 03/13/18 0457 03/14/18 0430  Weight: 65.3 kg 64.7 kg 65.4 kg    Examination:  General exam: Appears calm and comfortable  Respiratory system: Clear to auscultation. Respiratory effort normal. Cardiovascular system: S1 & S2 heard, RRR. No JVD, murmurs, rubs, gallops or clicks. No  pedal edema. Gastrointestinal system: Abdomen is nondistended, soft and nontender. No organomegaly or masses felt. Normal bowel sounds heard. Central nervous system: Alert and oriented. No focal neurological deficits. Extremities: Symmetric 5 x 5 power. Skin: No rashes, lesions or ulcers Psychiatry: Judgement and insight appear normal. Mood & affect appropriate.     Data Reviewed: I have personally reviewed following labs and imaging studies  CBC: Recent Labs  Lab 03/12/18 2105 03/13/18 0204 03/13/18 0534 03/13/18 1532 03/14/18 0538  WBC 21.1* 18.3* 16.4*  --  12.5*  NEUTROABS 15.9*  --   --   --  10.3*  HGB 14.1 13.3 13.0 12.0 11.7*  HCT 43.5 41.6 41.1 37.1 36.5  MCV 98.0 99.3 100.5*  --  100.6*  PLT 239 211 191  --  109   Basic Metabolic Panel: Recent Labs  Lab 03/12/18 2105 03/13/18 0534 03/14/18 0538  NA 138 138 138  K 3.7 3.7 4.1  CL 105 107 108  CO2 26 23 22   GLUCOSE 110* 97 166*  BUN 25* 21 17  CREATININE 0.86 0.87 1.06*  CALCIUM 9.3 8.5* 8.2*   GFR: Estimated Creatinine Clearance: 28 mL/min (A) (by C-G formula based on SCr of 1.06 mg/dL (H)). Liver Function Tests: Recent Labs  Lab 03/12/18 2105 03/14/18 0538  AST 19 15  ALT 18 13  ALKPHOS 54 42  BILITOT 0.5 0.7  PROT 7.3 5.4*  ALBUMIN 3.9 2.8*   Recent Labs  Lab 03/12/18 2105  LIPASE 29   No results for input(s): AMMONIA in the last 168 hours. Coagulation Profile: No results for input(s): INR, PROTIME in the last 168 hours. Cardiac Enzymes: No results for input(s): CKTOTAL, CKMB, CKMBINDEX, TROPONINI in the last 168 hours. BNP (last 3 results) No results for input(s): PROBNP in the last 8760 hours. HbA1C: No results for input(s): HGBA1C in the last 72 hours. CBG: Recent Labs  Lab 03/13/18 0503 03/13/18 0730 03/14/18 0736  GLUCAP 89 85 154*   Lipid Profile: No results for input(s): CHOL, HDL, LDLCALC, TRIG, CHOLHDL, LDLDIRECT in the last 72 hours. Thyroid Function Tests: No  results for input(s): TSH, T4TOTAL, FREET4, T3FREE, THYROIDAB in the last 72 hours. Anemia Panel: No results for input(s): VITAMINB12, FOLATE, FERRITIN, TIBC, IRON, RETICCTPCT in the last 72 hours. Sepsis Labs: Recent Labs  Lab 03/13/18 0204 03/13/18 0534 03/13/18 0928 03/14/18 0538  LATICACIDVEN 2.0* 2.4* 2.0* 1.4    No results found for this or any previous visit (from the past 240 hour(s)).       Radiology Studies: Ct Abdomen Pelvis W Contrast  Result Date: 03/12/2018 CLINICAL DATA:  Low back pain, abdominal pain EXAM: CT ABDOMEN AND PELVIS WITH CONTRAST TECHNIQUE: Multidetector CT imaging of the abdomen and pelvis was performed using the standard protocol following bolus administration of intravenous contrast. CONTRAST:  140m ISOVUE-300 IOPAMIDOL (ISOVUE-300) INJECTION 61% COMPARISON:  05/23/2016 FINDINGS: Lower chest: Bilateral lower lung chronic interstitial disease with fibrosis. Hepatobiliary: No focal liver abnormality is seen. No gallstones, gallbladder wall thickening, or biliary dilatation. Pancreas: Unremarkable. No pancreatic ductal dilatation or surrounding inflammatory changes. Spleen: Normal in size without focal  abnormality. Adrenals/Urinary Tract: Adrenal glands are unremarkable. Kidneys are normal, without renal calculi, focal lesion, or hydronephrosis. Bladder is unremarkable. Stomach/Bowel: Stomach is within normal limits. Incidental note made of a posterior gastric diverticulum. No pneumatosis, pneumoperitoneum or portal venous gas. No normal nor abnormal appendix is identified. Diverticulosis of the descending and sigmoid colon. Mild bowel wall thickening involving the transverse, descending and sigmoid colon concerning for mild colitis. Vascular/Lymphatic: Abdominal aortic atherosclerosis. No lymphadenopathy. Reproductive: Uterus and bilateral adnexa are unremarkable. Other: No abdominal wall hernia or abnormality. No abdominopelvic ascites. Musculoskeletal: No acute  osseous abnormality. No aggressive osseous lesion. Degenerative disc disease with disc height loss at L5-S1 with bilateral facet arthropathy. IMPRESSION: 1. Mild bowel wall thickening involving the transverse, descending and sigmoid colon concerning for mild colitis. 2. Diverticulosis of the descending and sigmoid colon. 3.  Aortic Atherosclerosis (ICD10-I70.0). Electronically Signed   By: Kathreen Devoid   On: 03/12/2018 22:24        Scheduled Meds: . insulin aspart  0-9 Units Subcutaneous Q4H  . levothyroxine  125 mcg Oral QAC breakfast  . pantoprazole  40 mg Oral Daily  . predniSONE  10 mg Oral QAC breakfast   Continuous Infusions: . sodium chloride 75 mL/hr at 03/14/18 0805  . ciprofloxacin    . metronidazole 500 mg (03/14/18 0807)  . sodium chloride 245.9 mL/hr at 03/13/18 0248  . sodium chloride       LOS: 1 day    Time spent: 35 mins    Irine Seal, MD [facility]  If 7PM-7AM, please contact night-coverage [email or web address] 03/14/2018, 9:56 AM

## 2018-03-14 NOTE — Progress Notes (Signed)
PHARMACY NOTE:  ANTIMICROBIAL RENAL DOSAGE ADJUSTMENT  Current antimicrobial regimen includes a mismatch between antimicrobial dosage and estimated renal function.  As per policy approved by the Pharmacy & Therapeutics and Medical Executive Committees, the antimicrobial dosage will be adjusted accordingly.  Current antimicrobial dosage: Cipro 400mg  IV q12h  Indication:Colitis  Renal Function:  Estimated Creatinine Clearance: 28 mL/min (A) (by C-G formula based on SCr of 1.06 mg/dL (H)). []      On intermittent HD, scheduled: []      On CRRT    Antimicrobial dosage has been changed to:  Cipro 400mg  IV q24h  Additional comments:   Thank you for allowing pharmacy to be a part of this patient's care.  Elson Clan, [facility] 03/14/2018 9:26 AM

## 2018-03-14 NOTE — Progress Notes (Signed)
[facility] Progress Note  Chief Complaint: Left-sided colitis  History:  Shelia Black is sitting up comfortably in bed eating dinner.  She denies abdominal pain, vomiting or lower GI bleeding.  ROS: Cardiovascular:  no chest pain Respiratory: no dyspnea  Objective:  Med list reviewed  Vital signs in last 24 hrs: Vitals:   03/14/18 1017 03/14/18 1331  BP: (!) 107/49 (!) 115/58  Pulse: 64 66  Resp: 16 16  Temp: 98.1 F (36.7 C) 98.6 F (37 C)  SpO2: 95% 95%    Physical Exam   Cardiac: RRR without murmurs, S1S2 heard, no peripheral edema  Pulm: clear to auscultation bilaterally, normal RR and effort noted  Abdomen: soft, no tenderness, with active bowel sounds. No guarding or palpable hepatosplenomegaly  Recent Labs:  CBC Latest Ref Rng & Units 03/14/2018 03/13/2018 03/13/2018  WBC 4.0 - 10.5 K/uL 12.5(H) - 16.4(H)  Hemoglobin 12.0 - 15.0 g/dL 11.7(L) 12.0 13.0  Hematocrit 36.0 - 46.0 % 36.5 37.1 41.1  Platelets 150 - 400 K/uL 197 - 191    No results for input(s): INR in the last 168 hours. CMP Latest Ref Rng & Units 03/14/2018 03/13/2018 03/12/2018  Glucose 70 - 99 mg/dL 166(H) 97 110(H)  BUN 8 - 23 mg/dL 17 21 25(H)  Creatinine 0.44 - 1.00 mg/dL 1.06(H) 0.87 0.86  Sodium 135 - 145 mmol/L 138 138 138  Potassium 3.5 - 5.1 mmol/L 4.1 3.7 3.7  Chloride 98 - 111 mmol/L 108 107 105  CO2 22 - 32 mmol/L 22 23 26   Calcium 8.9 - 10.3 mg/dL 8.2(L) 8.5(L) 9.3  Total Protein 6.5 - 8.1 g/dL 5.4(L) - 7.3  Total Bilirubin 0.3 - 1.2 mg/dL 0.7 - 0.5  Alkaline Phos 38 - 126 U/L 42 - 54  AST 15 - 41 U/L 15 - 19  ALT 0 - 44 U/L 13 - 18    @ASSESSMENTPLANBEGIN @ Assessment: Left-sided colitis, suspected ischemic, making a quick recovery.  Pain and bleeding have stopped.  Tolerating regular food, labs stable.  Plan: She can go home tomorrow from a GI perspective.  Total of 5 days regimen with ciprofloxacin and metronidazole to decrease chance of sepsis from bacterial translocation from  inflamed bowel. No need for routine GI follow-up. Signing off, call as needed   Nelida Meuse III Office: [phone]

## 2018-03-15 LAB — BASIC METABOLIC PANEL
Anion gap: 7 (ref 5–15)
BUN: 16 mg/dL (ref 8–23)
CALCIUM: 8.1 mg/dL — AB (ref 8.9–10.3)
CO2: 23 mmol/L (ref 22–32)
Chloride: 110 mmol/L (ref 98–111)
Creatinine, Ser: 0.92 mg/dL (ref 0.44–1.00)
GFR calc Af Amer: >60 mL/min (ref >60)
GFR calc non Af Amer: 53 mL/min — ABNORMAL LOW (ref >60)
Glucose, Bld: 122 mg/dL — ABNORMAL HIGH (ref 70–99)
Potassium: 3.6 mmol/L (ref 3.5–5.1)
Sodium: 140 mmol/L (ref 135–145)

## 2018-03-15 LAB — CBC WITH DIFFERENTIAL/PLATELET
Abs Immature Granulocytes: 0.19 10*3/uL — ABNORMAL HIGH (ref 0.00–0.07)
Basophils Absolute: 0 10*3/uL (ref 0.0–0.1)
Basophils Relative: 0 %
EOS PCT: 2 %
Eosinophils Absolute: 0.2 10*3/uL (ref 0.0–0.5)
HCT: 35.7 % — ABNORMAL LOW (ref 36.0–46.0)
Hemoglobin: 11.2 g/dL — ABNORMAL LOW (ref 12.0–15.0)
Immature Granulocytes: 2 %
Lymphocytes Relative: 28 %
Lymphs Abs: 3.5 10*3/uL (ref 0.7–4.0)
MCH: 31.9 pg (ref 26.0–34.0)
MCHC: 31.4 g/dL (ref 30.0–36.0)
MCV: 101.7 fL — ABNORMAL HIGH (ref 80.0–100.0)
Monocytes Absolute: 1 10*3/uL (ref 0.1–1.0)
Monocytes Relative: 8 %
Neutro Abs: 7.3 10*3/uL (ref 1.7–7.7)
Neutrophils Relative %: 60 %
Platelets: 200 10*3/uL (ref 150–400)
RBC: 3.51 MIL/uL — ABNORMAL LOW (ref 3.87–5.11)
RDW: 15.2 % (ref 11.5–15.5)
WBC: 12.2 10*3/uL — ABNORMAL HIGH (ref 4.0–10.5)
nRBC: 0 % (ref 0.0–0.2)

## 2018-03-15 LAB — GLUCOSE, CAPILLARY
Glucose-Capillary: 110 mg/dL — ABNORMAL HIGH (ref 70–99)
Glucose-Capillary: 184 mg/dL — ABNORMAL HIGH (ref 70–99)

## 2018-03-15 MED ORDER — ALUM & MAG HYDROXIDE-SIMETH 200-200-20 MG/5ML PO SUSP: 30.0000 mL | Freq: Four times a day (QID) | ORAL | Status: DC | PRN

## 2018-03-15 MED ORDER — OMEPRAZOLE 40 MG PO CPDR: 40.0000 mg | DELAYED_RELEASE_CAPSULE | Freq: Every day | ORAL | 1 refills | Status: AC

## 2018-03-15 MED ORDER — CIPROFLOXACIN IN D5W 400 MG/200ML IV SOLN
400.0000 mg | Freq: Two times a day (BID) | INTRAVENOUS | Status: DC
  Administered 2018-03-15: 400 mg via INTRAVENOUS
  Filled 2018-03-15: qty 200

## 2018-03-15 MED ORDER — ALUM & MAG HYDROXIDE-SIMETH 200-200-20 MG/5ML PO SUSP
30.0000 mL | ORAL | Status: DC | PRN
  Administered 2018-03-15: 30 mL via ORAL
  Filled 2018-03-15: qty 30

## 2018-03-15 MED ORDER — CIPROFLOXACIN HCL 500 MG PO TABS: 500.0000 mg | ORAL_TABLET | Freq: Two times a day (BID) | ORAL | 0 refills | Status: AC

## 2018-03-15 MED ORDER — METRONIDAZOLE 500 MG PO TABS: 500.0000 mg | ORAL_TABLET | Freq: Three times a day (TID) | ORAL | 0 refills | Status: AC

## 2018-03-15 MED ORDER — LIDOCAINE VISCOUS HCL 2 % MT SOLN: 15.0000 mL | Freq: Four times a day (QID) | OROMUCOSAL | Status: DC | PRN

## 2018-03-15 NOTE — Progress Notes (Signed)
Reviewed discharge information with patient and caregiver. Answered all questions. Patient/caregiver able to teach back medications and reasons to contact MD/911. Patient verbalizes importance of PCP follow up appointment.  Aby Gessel M. Merrily Tegeler, RN  

## 2018-03-15 NOTE — Discharge Summary (Signed)
Physician Discharge Summary  Shelia Black GQQ:[id number] DOB: [birth year] DOA: 03/12/2018  PCP: Shelia Stalker, PA-C  Admit date: 03/12/2018 Discharge date: 03/15/2018  Time spent: 50 minutes  Recommendations for Outpatient Follow-up:  1. Follow-up with Shelia Stalker, PA-C intact in 2 weeks.  On follow-up patient will need a basic metabolic profile done to follow-up on electrolytes and renal function.  Patient need a CBC done to follow-up on H&H.  Patient blood pressure need to be reassessed at that time.   Discharge Diagnoses:  Principal Problem:   Colitis with rectal bleeding Active Problems:   Ischemic colitis ([place])   Diabetes mellitus type 2 in obese [facility])   Pulmonary fibrosis ([place])   Colitis   Discharge Condition: Stable and improved  Diet recommendation: Regular  Filed Weights   03/13/18 0457 03/14/18 0430 03/15/18 0500  Weight: 64.7 kg 65.4 kg 66.8 kg    History of present illness:  Per Dr. Dia Black is a 85 y.o. female with history of hypothyroidism, diabetes mellitus, COPD and interstitial lung disease presented to the ER because of increasing lower abdominal pain with rectal bleeding last 3 days.  Patient stated she had been having frank rectal bleeding last 3 days at times good volume.  Abdominal pain was constant across the lower abdominal quadrants.  Denied any vomiting fever chills.  Denied using any recent antibiotics or sick contacts.  ED Course: In the ER patient's hemoglobin is found to be around 14.  WBC count was 21.  CT abdomen and pelvis done shows colitis involving transverse descending and sigmoid colon.  Patient was admitted in April 2018 for diverticulitis.  Patient at this time is been a started on IV fluids pain relief medications Cipro and Flagyl.  On exam patient abdomen appears soft no rebound tenderness or rigidity.  Hospital Course:  1 colitis with rectal bleeding likely secondary to ischemic colitis Patient  presented with abdominal pain and rectal bleeding x3 days prior to admission.  CT abdomen and pelvis done consistent with mild colitis involving the transverse descending and sigmoid colon.  Patient noted to have blood pressure to be borderline and likely contributed to ischemic colitis.  Stool studies were ordered however patient with no bowel movement and as such unable to obtain stool studies.  Patient was seen in consultation by gastroenterology who recommended continued supportive treatment, continue empiric IV antibiotics of ciprofloxacin and Flagyl.  If patient has a bowel movement and stools are able to be collected stool studies were ordered.  Patient will likely need continuation of antibiotics for total of 5 days to decrease chance of bacterial translocation and sepsis from ischemic colitis.  Patient blood pressure noted to be borderline and as such patient hydrated with IV fluids.  Blood pressure improved.  Patient improved clinically.  Patient was initially placed on a clear liquid diet and diet advanced to a low residue diet which he tolerated.  Patient hydrated with IV fluids.  Patient had no further rectal bleeding during the hospitalization.  Patient will be discharged home on 3 more days of oral ciprofloxacin and Flagyl to complete a 5-day course of antibiotic treatment as recommended per gastroenterology.  Outpatient follow-up with PCP.   2.  Diabetes mellitus type 2 Patient was maintained on sliding scale insulin throughout the hospitalization.   3.  Borderline blood pressure On admission and during the hospitalization patient was noted to have borderline blood pressure.  Patient was admitted for ischemic colitis.  Patient not on any antihypertensive medications.  Patient was hydrated gently with IV fluids.  Blood pressure improved.  Outpatient follow-up.    4.  Hypothyroidism Patient maintained on home regimen Synthroid.    5.  History of COPD and interstitial lung  disease Currently stable.  Patient was on prednisone 10 mg daily prior to admission which was resumed.  Outpatient follow-up.    Procedures:  CT abdomen and pelvis 03/12/2018  Consultations:  Gastroenterology: Dr.Danis III 03/13/2018   Discharge Exam: Vitals:   03/15/18 0510 03/15/18 1256  BP: (!) 109/55 (!) 137/58  Pulse: (!) 55 (!) 58  Resp: 16 18  Temp: 98.5 F (36.9 C) 98 F (36.7 C)  SpO2: 96% 98%    General: NAD Cardiovascular: RRR Respiratory: CTAB  Discharge Instructions   Discharge Instructions    Diet general   Complete by:  As directed    Increase activity slowly   Complete by:  As directed      Allergies as of 03/15/2018      Reactions   Codeine    Nausea and vomiting   Percocet [oxycodone-acetaminophen] Nausea And Vomiting   Penicillins Hives, Rash   Has patient had a PCN reaction causing immediate rash, facial/tongue/throat swelling, SOB or lightheadedness with hypotension: no Has patient had a PCN reaction causing severe rash involving mucus membranes or skin necrosis: unknown Has patient had a PCN reaction that required hospitalization : unknown Has patient had a PCN reaction occurring within the last 10 years: no If all of the above answers are "NO", then may proceed with Cephalosporin use.      Medication List    STOP taking these medications   ibuprofen 600 MG tablet Commonly known as:  ADVIL,MOTRIN   methocarbamol 500 MG tablet Commonly known as:  ROBAXIN   mirtazapine 30 MG tablet Commonly known as:  REMERON     TAKE these medications   acetaminophen 500 MG tablet Commonly known as:  TYLENOL Take 500 mg by mouth every 6 (six) hours as needed for moderate pain. 1 in the morning and 2 at night   albuterol 108 (90 Base) MCG/ACT inhaler Commonly known as:  PROVENTIL HFA;VENTOLIN HFA Inhale 1 puff into the lungs every 6 (six) hours as needed for wheezing or shortness of breath.   ALIGN PO Take 1 tablet by mouth daily.   ammonium  lactate 12 % lotion Commonly known as:  LAC-HYDRIN Apply 1 application topically as needed for dry skin.   CALCIUM 600 PO Take 1 tablet by mouth 2 (two) times daily.   chlorpheniramine-HYDROcodone 10-8 MG/5ML Suer Commonly known as:  TUSSIONEX Take 5 mLs by mouth every 12 (twelve) hours as needed for cough.   ciprofloxacin 500 MG tablet Commonly known as:  CIPRO Take 1 tablet (500 mg total) by mouth 2 (two) times daily for 3 days. What changed:    how much to take  how to take this  when to take this  additional instructions   diphenoxylate-atropine 2.5-0.025 MG tablet Commonly known as:  LOMOTIL Take 1 tablet by mouth 4 (four) times daily as needed for diarrhea or loose stools. diarrhea   levothyroxine 125 MCG tablet Commonly known as:  SYNTHROID, LEVOTHROID Take 125 mcg by mouth daily before breakfast.   metroNIDAZOLE 500 MG tablet Commonly known as:  FLAGYL Take 1 tablet (500 mg total) by mouth 3 (three) times daily for 3 days. What changed:    how much to take  how to take this  when to take this  additional instructions  omeprazole 40 MG capsule Commonly known as:  PRILOSEC Take 1 capsule (40 mg total) by mouth daily. What changed:    medication strength  how much to take   predniSONE 20 MG tablet Commonly known as:  DELTASONE Take 2 tablets (40 mg total) by mouth daily. What changed:    how much to take  when to take this   vitamin C 500 MG tablet Commonly known as:  ASCORBIC ACID Take 500 mg by mouth daily.   Vitamin D-3 125 MCG (5000 UT) Tabs Take 1 tablet by mouth daily.      Allergies  Allergen Reactions  . Codeine     Nausea and vomiting  . Percocet [Oxycodone-Acetaminophen] Nausea And Vomiting  . Penicillins Hives and Rash    Has patient had a PCN reaction causing immediate rash, facial/tongue/throat swelling, SOB or lightheadedness with hypotension: no Has patient had a PCN reaction causing severe rash involving mucus  membranes or skin necrosis: unknown Has patient had a PCN reaction that required hospitalization : unknown Has patient had a PCN reaction occurring within the last 10 years: no If all of the above answers are "NO", then may proceed with Cephalosporin use.    Follow-up Information    Shelia Stalker, PA-C. Schedule an appointment as soon as possible for a visit in 2 week(s).   Specialty:  Family Medicine Contact information: [place] [address] [phone]            The results of significant diagnostics from this hospitalization (including imaging, microbiology, ancillary and laboratory) are listed below for reference.    Significant Diagnostic Studies: Ct Cervical Spine Wo Contrast  Result Date: 02/28/2018 CLINICAL DATA:  Severe neck pain for 3-4 days without fall or known injury. Question arthritis. EXAM: CT CERVICAL SPINE WITHOUT CONTRAST TECHNIQUE: Multidetector CT imaging of the cervical spine was performed without intravenous contrast. Multiplanar CT image reconstructions were also generated. COMPARISON:  None. FINDINGS: Alignment: There is mild dextroconvex curvature of the cervical spine. Additionally there is reversal cervical lordosis attributable to degenerative disc disease, apex at C5. The craniocervical relationship appears intact. No splaying of the lateral masses of C1 on C2. The atlantodental interval is intact. Skull base and vertebrae: No skull base fracture or suspicious osseous lesions. No acute vertebral fracture or focal pathologic process. Soft tissues and spinal canal: No prevertebral soft tissue swelling. No visible canal hematoma. Disc levels: C2-C3: Mild-to-moderate disc flattening. Minimal central disc bulge. No significant central foraminal encroachment. C3-C4: Moderate disc flattening without focal disc herniation or or disc bulge. No significant central or foraminal encroachment. Mild moderate right and mild left facet arthropathy.  C4-C5: Marked disc flattening with anterior posterior osteophytes. No focal disc herniation. Moderate to marked right-sided foraminal encroachment from uncovertebral joint osteoarthritis and uncinate spurring. No significant left foraminal encroachment though there is uncovertebral joint spurring as well. C5-C6: Marked disc flattening with small posterior marginal osteophytes, central to right central. Slight right foraminal encroachment from uncinate spurring and uncovertebral joint osteoarthritis. C6-C7: Moderate to marked disc flattening, no significant central nor foraminal encroachment. Central osteophyte. Touching upon the ventral aspect of the thecal sac. C7-T1: Moderate disc flattening without central foraminal encroachment. Upper chest: Extracranial carotid arteriosclerosis is noted bilaterally. Subpleural fibrosis is seen at the lung apices. Other: None IMPRESSION: 1. No acute cervical spine fracture or posttraumatic listhesis. 2. Cervical spondylosis as above. Marked disc flattening C4-5, C5-6 and C6-7 as above described associated with uncovertebral joint osteoarthritis and uncinate spurring  contributing to variable degrees of foraminal encroachment, greatest at C4-5 on the right. Electronically Signed   By: Ashley Royalty M.D.   On: 02/28/2018 19:30   Ct Abdomen Pelvis W Contrast  Result Date: 03/12/2018 CLINICAL DATA:  Low back pain, abdominal pain EXAM: CT ABDOMEN AND PELVIS WITH CONTRAST TECHNIQUE: Multidetector CT imaging of the abdomen and pelvis was performed using the standard protocol following bolus administration of intravenous contrast. CONTRAST:  155m ISOVUE-300 IOPAMIDOL (ISOVUE-300) INJECTION 61% COMPARISON:  05/23/2016 FINDINGS: Lower chest: Bilateral lower lung chronic interstitial disease with fibrosis. Hepatobiliary: No focal liver abnormality is seen. No gallstones, gallbladder wall thickening, or biliary dilatation. Pancreas: Unremarkable. No pancreatic ductal dilatation or  surrounding inflammatory changes. Spleen: Normal in size without focal abnormality. Adrenals/Urinary Tract: Adrenal glands are unremarkable. Kidneys are normal, without renal calculi, focal lesion, or hydronephrosis. Bladder is unremarkable. Stomach/Bowel: Stomach is within normal limits. Incidental note made of a posterior gastric diverticulum. No pneumatosis, pneumoperitoneum or portal venous gas. No normal nor abnormal appendix is identified. Diverticulosis of the descending and sigmoid colon. Mild bowel wall thickening involving the transverse, descending and sigmoid colon concerning for mild colitis. Vascular/Lymphatic: Abdominal aortic atherosclerosis. No lymphadenopathy. Reproductive: Uterus and bilateral adnexa are unremarkable. Other: No abdominal wall hernia or abnormality. No abdominopelvic ascites. Musculoskeletal: No acute osseous abnormality. No aggressive osseous lesion. Degenerative disc disease with disc height loss at L5-S1 with bilateral facet arthropathy. IMPRESSION: 1. Mild bowel wall thickening involving the transverse, descending and sigmoid colon concerning for mild colitis. 2. Diverticulosis of the descending and sigmoid colon. 3.  Aortic Atherosclerosis (ICD10-I70.0). Electronically Signed   By: HKathreen Devoid  On: 03/12/2018 22:24    Microbiology: No results found for this or any previous visit (from the past 240 hour(s)).   Labs: Basic Metabolic Panel: Recent Labs  Lab 03/12/18 2105 03/13/18 0534 03/14/18 0538 03/15/18 0542  NA 138 138 138 140  K 3.7 3.7 4.1 3.6  CL 105 107 108 110  CO2 26 23 22 23   GLUCOSE 110* 97 166* 122*  BUN 25* 21 17 16   CREATININE 0.86 0.87 1.06* 0.92  CALCIUM 9.3 8.5* 8.2* 8.1*   Liver Function Tests: Recent Labs  Lab 03/12/18 2105 03/14/18 0538  AST 19 15  ALT 18 13  ALKPHOS 54 42  BILITOT 0.5 0.7  PROT 7.3 5.4*  ALBUMIN 3.9 2.8*   Recent Labs  Lab 03/12/18 2105  LIPASE 29   No results for input(s): AMMONIA in the last 168  hours. CBC: Recent Labs  Lab 03/12/18 2105 03/13/18 0204 03/13/18 0534 03/13/18 1532 03/14/18 0538 03/15/18 0542  WBC 21.1* 18.3* 16.4*  --  12.5* 12.2*  NEUTROABS 15.9*  --   --   --  10.3* 7.3  HGB 14.1 13.3 13.0 12.0 11.7* 11.2*  HCT 43.5 41.6 41.1 37.1 36.5 35.7*  MCV 98.0 99.3 100.5*  --  100.6* 101.7*  PLT 239 211 191  --  197 200   Cardiac Enzymes: No results for input(s): CKTOTAL, CKMB, CKMBINDEX, TROPONINI in the last 168 hours. BNP: BNP (last 3 results) No results for input(s): BNP in the last 8760 hours.  ProBNP (last 3 results) No results for input(s): PROBNP in the last 8760 hours.  CBG: Recent Labs  Lab 03/14/18 1119 03/14/18 1628 03/14/18 2111 03/15/18 0719 03/15/18 1117  GLUCAP 148* 234* 120* 110* 184*       Signed:  DIrine SealMD.  [facility] 03/15/2018, 1:09 PM

## 2018-03-15 NOTE — Progress Notes (Addendum)
Pt complained of heart burn at 0010, given Maalox at 0015. Then pt stated that she was having sharp pains in her chest at 8/10, pts vitals were stable, obtained an EKG, notified MD Bl[place], awaiting response. Pt now is stating that her pain is 0/10 and she feel much better.  MD responded at 0120 no new orders at this time.  Lenox Ponds, RN

## 2018-03-15 NOTE — Progress Notes (Signed)
PHARMACY NOTE:  ANTIMICROBIAL RENAL DOSAGE ADJUSTMENT  Current antimicrobial regimen includes a mismatch between antimicrobial dosage and estimated renal function.  As per policy approved by the Pharmacy & Therapeutics and Medical Executive Committees, the antimicrobial dosage will be adjusted accordingly.  Current antimicrobial dosage: Cipro 400mg  IV q24h  Indication:Colitis  Renal Function:  Estimated Creatinine Clearance: 35.1 mL/min (by C-G formula based on SCr of 0.92 mg/dL). []      On intermittent HD, scheduled: []      On CRRT    Antimicrobial dosage has been changed to:  Cipro 400mg  IV q12h  Additional comments:   Thank you for allowing pharmacy to be a part of this patient's care.  Loralee Pacas, PharmD, BCPS Pager: [phone] 03/15/2018 11:45 AM

## 2018-03-19 LAB — GLUCOSE, CAPILLARY
Glucose-Capillary: 94 mg/dL (ref 70–99)
Glucose-Capillary: 85 mg/dL (ref 70–99)
Glucose-Capillary: 187 mg/dL — ABNORMAL HIGH (ref 70–99)

## 2018-03-31 ENCOUNTER — Emergency Department (HOSPITAL_COMMUNITY): Payer: Medicare Other

## 2018-03-31 ENCOUNTER — Encounter (HOSPITAL_COMMUNITY): Payer: Self-pay

## 2018-03-31 ENCOUNTER — Emergency Department (HOSPITAL_COMMUNITY)
Admission: EM | Admit: 2018-03-31 | Discharge: 2018-03-31 | Disposition: A | Discharge status: Home / Self Care | Payer: Medicare Other | Attending: Emergency Medicine | Admitting: Emergency Medicine

## 2018-03-31 DIAGNOSIS — M25551 Pain in right hip: Secondary | ICD-10-CM | POA: Diagnosis present

## 2018-03-31 DIAGNOSIS — M545 Low back pain: Secondary | ICD-10-CM | POA: Diagnosis not present

## 2018-03-31 DIAGNOSIS — J449 Chronic obstructive pulmonary disease, unspecified: Secondary | ICD-10-CM | POA: Diagnosis not present

## 2018-03-31 DIAGNOSIS — E039 Hypothyroidism, unspecified: Secondary | ICD-10-CM | POA: Diagnosis not present

## 2018-03-31 DIAGNOSIS — E119 Type 2 diabetes mellitus without complications: Secondary | ICD-10-CM | POA: Diagnosis not present

## 2018-03-31 DIAGNOSIS — M549 Dorsalgia, unspecified: Secondary | ICD-10-CM

## 2018-03-31 LAB — BASIC METABOLIC PANEL
Anion gap: 8 (ref 5–15)
BUN: 20 mg/dL (ref 8–23)
CALCIUM: 8.9 mg/dL (ref 8.9–10.3)
CO2: 25 mmol/L (ref 22–32)
CREATININE: 0.95 mg/dL (ref 0.44–1.00)
Chloride: 101 mmol/L (ref 98–111)
GFR calc Af Amer: 59 mL/min — ABNORMAL LOW (ref >60)
GFR calc non Af Amer: 51 mL/min — ABNORMAL LOW (ref >60)
Glucose, Bld: 236 mg/dL — ABNORMAL HIGH (ref 70–99)
Potassium: 4.3 mmol/L (ref 3.5–5.1)
Sodium: 134 mmol/L — ABNORMAL LOW (ref 135–145)

## 2018-03-31 LAB — CBC WITH DIFFERENTIAL/PLATELET
Abs Immature Granulocytes: 0.06 10*3/uL (ref 0.00–0.07)
Basophils Absolute: 0 10*3/uL (ref 0.0–0.1)
Basophils Relative: 0 %
EOS ABS: 0.3 10*3/uL (ref 0.0–0.5)
Eosinophils Relative: 3 %
HCT: 39.3 % (ref 36.0–46.0)
Hemoglobin: 12.2 g/dL (ref 12.0–15.0)
Immature Granulocytes: 1 %
LYMPHS ABS: 2.4 10*3/uL (ref 0.7–4.0)
Lymphocytes Relative: 24 %
MCH: 31.7 pg (ref 26.0–34.0)
MCHC: 31 g/dL (ref 30.0–36.0)
MCV: 102.1 fL — ABNORMAL HIGH (ref 80.0–100.0)
Monocytes Absolute: 0.9 10*3/uL (ref 0.1–1.0)
Monocytes Relative: 9 %
Neutro Abs: 6.2 10*3/uL (ref 1.7–7.7)
Neutrophils Relative %: 63 %
Platelets: 278 10*3/uL (ref 150–400)
RBC: 3.85 MIL/uL — ABNORMAL LOW (ref 3.87–5.11)
RDW: 14.5 % (ref 11.5–15.5)
WBC: 9.9 10*3/uL (ref 4.0–10.5)
nRBC: 0 % (ref 0.0–0.2)

## 2018-03-31 LAB — CBG MONITORING, ED: Glucose-Capillary: 205 mg/dL — ABNORMAL HIGH (ref 70–99)

## 2018-03-31 LAB — URINALYSIS, ROUTINE W REFLEX MICROSCOPIC
BILIRUBIN URINE: NEGATIVE
Glucose, UA: 50 mg/dL — AB
Hgb urine dipstick: NEGATIVE
Ketones, ur: NEGATIVE mg/dL
Leukocytes,Ua: NEGATIVE
Nitrite: NEGATIVE
Protein, ur: NEGATIVE mg/dL
Specific Gravity, Urine: 1.015 (ref 1.005–1.030)
pH: 7 (ref 5.0–8.0)

## 2018-03-31 MED ORDER — ONDANSETRON 4 MG PO TBDP: 4.0000 mg | ORAL_TABLET | Freq: Three times a day (TID) | ORAL | 0 refills | Status: DC | PRN

## 2018-03-31 MED ORDER — SODIUM CHLORIDE (PF) 0.9 % IJ SOLN
INTRAMUSCULAR | Status: AC
  Filled 2018-03-31: qty 50

## 2018-03-31 MED ORDER — FENTANYL CITRATE (PF) 100 MCG/2ML IJ SOLN
50.0000 ug | Freq: Once | INTRAMUSCULAR | Status: AC
  Administered 2018-03-31: 50 ug via INTRAVENOUS
  Filled 2018-03-31: qty 2

## 2018-03-31 MED ORDER — IOPAMIDOL (ISOVUE-300) INJECTION 61%
INTRAVENOUS | Status: AC
  Filled 2018-03-31: qty 100

## 2018-03-31 MED ORDER — HYDROCODONE-ACETAMINOPHEN 5-325 MG PO TABS: 1.0000 | ORAL_TABLET | Freq: Four times a day (QID) | ORAL | 0 refills | Status: DC | PRN

## 2018-03-31 MED ORDER — IOPAMIDOL (ISOVUE-300) INJECTION 61%
100.0000 mL | Freq: Once | INTRAVENOUS | Status: AC | PRN
  Administered 2018-03-31: 100 mL via INTRAVENOUS

## 2018-03-31 NOTE — ED Provider Notes (Signed)
[facility]-EMERGENCY DEPT Provider Note   CSN: [id number] Arrival date & time: 03/31/18  0315    History   Chief Complaint Chief Complaint  Patient presents with  . Hip Pain    HPI Shelia Black is a 85 y.o. female.     Patient presents to the emergency department with a chief complaint of right flank and groin pain.  She states the symptoms started 3 days ago.  She is accompanied by her daughter who assists with the history.  Patient denies any falls or traumatic injury.  She denies any dysuria or hematuria.  She rates her pain is 10 out of 10.  She has tried ibuprofen with no relief.  Of note, patient was recently admitted for ischemic colitis.  She states that she no longer has any abdominal pain.  Denies any associated fevers or chills.  She is able to ambulate.  The history is provided by the patient. No language interpreter was used.    Past Medical History:  Diagnosis Date  . Arthritis   . Cataract   . Chronic cough   . COPD (chronic obstructive pulmonary disease) (HCC)   . Diabetes mellitus without complication (HCC)   . Diverticulitis   . Diverticulosis   . GERD (gastroesophageal reflux disease)   . Hepatic steatosis   . Hiatal hernia   . Hypothyroidism   . ILD (interstitial lung disease) (HCC)   . Pulmonary fibrosis (HCC)   . Thyroid disease   . Vertigo     Patient Active Problem List   Diagnosis Date Noted  . Ischemic colitis (HCC) 03/14/2018  . Colitis 03/13/2018  . Colitis with rectal bleeding 03/12/2018  . Sepsis (HCC) 05/23/2016  . Diverticulitis 05/23/2016  . GERD (gastroesophageal reflux disease) 05/23/2016  . Hypothyroidism 05/23/2016  . Depression 05/23/2016  . ILD (interstitial lung disease) (HCC)   . Pulmonary fibrosis (HCC)   . Acute UTI   . UTI (lower urinary tract infection)   . Hyperglycemia 02/20/2015  . Diabetes mellitus type 2 in obese (HCC) 02/20/2015  . CKD (chronic kidney disease) stage 3, GFR 30-59  ml/min (HCC) 02/20/2015  . UTI (urinary tract infection) 04/29/2014  . Diverticulitis of colon with bleeding 04/29/2014  . Acute respiratory failure with hypoxia (HCC) 04/29/2014  . Acute renal failure (HCC) 04/29/2014  . Hyperkalemia 04/29/2014  . Nausea and vomiting 04/29/2014    Past Surgical History:  Procedure Laterality Date  . APPENDECTOMY    . INTRAOCULAR LENS INSERTION  1997  . REPLACEMENT TOTAL KNEE BILATERAL    . SHOULDER SURGERY Right    tendonitis     OB History   No obstetric history on file.      Home Medications    Prior to Admission medications   Medication Sig Start Date End Date Taking? Authorizing Provider  acetaminophen (TYLENOL) 500 MG tablet Take 500 mg by mouth every 6 (six) hours as needed for moderate pain. 1 in the morning and 2 at night    [provider]  albuterol (PROVENTIL HFA;VENTOLIN HFA) 108 (90 Base) MCG/ACT inhaler Inhale 1 puff into the lungs every 6 (six) hours as needed for wheezing or shortness of breath. 05/27/16   Lenox Ponds, MD  ammonium lactate (LAC-HYDRIN) 12 % lotion Apply 1 application topically as needed for dry skin.    [provider]  Calcium Carbonate (CALCIUM 600 PO) Take 1 tablet by mouth 2 (two) times daily.    [provider]  chlorpheniramine-HYDROcodone Stevphen Meuse)  10-8 MG/5ML SUER Take 5 mLs by mouth every 12 (twelve) hours as needed for cough.  01/14/18   [provider]  Cholecalciferol (VITAMIN D-3) 5000 units TABS Take 1 tablet by mouth daily.    [provider]  diphenoxylate-atropine (LOMOTIL) 2.5-0.025 MG tablet Take 1 tablet by mouth 4 (four) times daily as needed for diarrhea or loose stools. diarrhea 11/11/12   [provider]  levothyroxine (SYNTHROID, LEVOTHROID) 125 MCG tablet Take 125 mcg by mouth daily before breakfast.    [provider]  omeprazole (PRILOSEC) 40 MG capsule Take 1 capsule (40 mg total) by mouth daily. 03/15/18   Rodolph Bong, MD  predniSONE (DELTASONE) 20 MG tablet Take 2 tablets (40 mg total) by mouth daily. Patient taking differently: Take 20 mg by mouth 2 (two) times daily with a meal.  03/01/18   Benjiman Core, MD  Probiotic Product (ALIGN PO) Take 1 tablet by mouth daily.    [provider]  vitamin C (ASCORBIC ACID) 500 MG tablet Take 500 mg by mouth daily.    [provider]    Family History Family History  Problem Relation Age of Onset  . Diabetes Mother   . Kidney cancer Mother   . Hypertension Father   . Supraventricular tachycardia Father   . Skin cancer Father   . Diabetes Sister     Social History Social History   Tobacco Use  . Smoking status: Never Smoker  . Smokeless tobacco: Never Used  Substance Use Topics  . Alcohol use: No  . Drug use: No     Allergies   Codeine; Percocet [oxycodone-acetaminophen]; and Penicillins   Review of Systems Review of Systems  All other systems reviewed and are negative.    Physical Exam Updated Vital Signs BP 122/77 (BP Location: Left Arm)   Pulse 76   Temp 97.6 F (36.4 C) (Oral)   Resp 16   Ht  (1.626 m)   Wt 66.7 kg   LMP  (LMP Unknown)   SpO2 97%   BMI 25.23 kg/m   Physical Exam Vitals signs and nursing note reviewed.  Constitutional:      Appearance: She is well-developed.  HENT:     Head: Normocephalic and atraumatic.  Eyes:     Conjunctiva/sclera: Conjunctivae normal.     Pupils: Pupils are equal, round, and reactive to light.  Neck:     Musculoskeletal: Normal range of motion and neck supple.  Cardiovascular:     Rate and Rhythm: Normal rate and regular rhythm.     Heart sounds: No murmur. No friction rub. No gallop.   Pulmonary:     Effort: Pulmonary effort is normal. No respiratory distress.     Breath sounds: Normal breath sounds. No wheezing or rales.  Chest:     Chest wall: No tenderness.  Abdominal:     General: Bowel sounds are normal. There is no distension.      Palpations: Abdomen is soft. There is no mass.     Tenderness: There is no abdominal tenderness. There is no guarding or rebound.  Musculoskeletal: Normal range of motion.        General: No tenderness.  Skin:    General: Skin is warm and dry.  Neurological:     Mental Status: She is alert and oriented to person, place, and time.  Psychiatric:        Behavior: Behavior normal.        Thought Content: Thought  content normal.        Judgment: Judgment normal.      ED Treatments / Results  Labs (all labs ordered are listed, but only abnormal results are displayed) Labs Reviewed  CBC WITH DIFFERENTIAL/PLATELET - Abnormal; Notable for the following components:      Result Value   RBC 3.85 (*)    MCV 102.1 (*)    All other components within normal limits  BASIC METABOLIC PANEL - Abnormal; Notable for the following components:   Sodium 134 (*)    Glucose, Bld 236 (*)    GFR calc non Af Amer 51 (*)    GFR calc Af Amer 59 (*)    All other components within normal limits  URINALYSIS, ROUTINE W REFLEX MICROSCOPIC - Abnormal; Notable for the following components:   Glucose, UA 50 (*)    All other components within normal limits  CBG MONITORING, ED - Abnormal; Notable for the following components:   Glucose-Capillary 205 (*)    All other components within normal limits    EKG None  Radiology Dg Elbow Complete Left  Result Date: 03/31/2018 CLINICAL DATA:  85 y/o  F; anterior elbow pain. No known injury. EXAM: LEFT ELBOW - COMPLETE 3+ VIEW COMPARISON:  None. FINDINGS: No acute fracture or dislocation identified. Prominent anterior fat pad may represent a joint effusion. Medial epicondyle enthesophyte. IMPRESSION: 1. No acute fracture or dislocation identified. 2. Prominent anterior fat pad may represent a joint effusion. 3. Medial epicondyle enthesophyte. Electronically Signed   By: Mitzi Hansen M.D.   On: 03/31/2018 04:22   Ct Abdomen Pelvis W Contrast  Result Date:  03/31/2018 CLINICAL DATA:  85 y/o  F; back pain. EXAM: CT ABDOMEN AND PELVIS WITH CONTRAST CT LUMBAR SPINE WITHOUT CONTRAST TECHNIQUE: Multidetector CT imaging of the abdomen and pelvis was performed using the standard protocol following bolus administration of intravenous contrast. Multidetector CT imaging of the lumbar spine was performed without intravenous contrast administration. Multiplanar CT image reconstructions were also generated. CONTRAST:  ISOVUE-300 IOPAMIDOL (ISOVUE-300) INJECTION 61% COMPARISON:  None. FINDINGS: Lumbar spine findings: Segmentation: 5 lumbar type vertebrae. Alignment: Mild lumbar levocurvature with apex at L4. Straightening of lumbar lordosis. Vertebrae: No acute fracture or focal pathologic process. Paraspinal and other soft tissues: Negative. Disc levels: Multilevel discogenic degenerative changes with loss of intervertebral disc space height greatest at the L2-3, L3-4, and L5-S1 levels. Advanced lower lumbar facet arthrosis. Disc and facet degenerative changes contribute to right-sided L3-4 and L4-5 foraminal stenosis as well as left-sided L3-S1 foraminal stenosis. Mild-to-moderate spinal canal stenosis at the L3-4 and L4-5 levels. Abdomen and pelvis findings: Lower chest: Stable peripheral and basilar lung fibrosis. Hepatobiliary: No focal liver abnormality is seen. No gallstones, gallbladder wall thickening, or biliary dilatation. Pancreas: Unremarkable. No pancreatic ductal dilatation or surrounding inflammatory changes. Spleen: Normal in size without focal abnormality. Adrenals/Urinary Tract: Adrenal glands are unremarkable. Multiple stable renal cysts measuring up to 22 mm arising from left interpolar kidney. Kidneys are normal, without renal calculi, focal lesion, or hydronephrosis. Bladder is unremarkable. Stomach/Bowel: Diverticulum of gastric cardia. Otherwise stomach is within normal limits. Appendectomy. No evidence of bowel wall thickening, distention, or  inflammatory changes. Extensive sigmoid diverticulosis, no findings of acute diverticulitis Vascular/Lymphatic: Aortic atherosclerosis. No enlarged abdominal or pelvic lymph nodes. Reproductive: Uterus and bilateral adnexa are unremarkable. Other: No abdominal wall hernia or abnormality. No abdominopelvic ascites. Musculoskeletal: No fracture is seen. IMPRESSION: CT lumbar spine: 1. No acute fracture or dislocation. 2. Stable mild  lumbar levocurvature and mild-to-moderate multilevel spondylosis greatest at the L3-S1. CT abdomen and pelvis: 1. No acute process identified. 2. Extensive sigmoid diverticulosis, no findings of acute diverticulitis. 3.  Aortic Atherosclerosis (ICD10-I70.0). 4. Stable lung base fibrosis. Electronically Signed   By: Mitzi HansenLance  Furusawa-Stratton M.D.   On: 03/31/2018 05:52   Ct L-spine No Charge  Result Date: 03/31/2018 CLINICAL DATA:  85 y/o  F; back pain. EXAM: CT ABDOMEN AND PELVIS WITH CONTRAST CT LUMBAR SPINE WITHOUT CONTRAST TECHNIQUE: Multidetector CT imaging of the abdomen and pelvis was performed using the standard protocol following bolus administration of intravenous contrast. Multidetector CT imaging of the lumbar spine was performed without intravenous contrast administration. Multiplanar CT image reconstructions were also generated. CONTRAST:  100mL ISOVUE-300 IOPAMIDOL (ISOVUE-300) INJECTION 61% COMPARISON:  None. FINDINGS: Lumbar spine findings: Segmentation: 5 lumbar type vertebrae. Alignment: Mild lumbar levocurvature with apex at L4. Straightening of lumbar lordosis. Vertebrae: No acute fracture or focal pathologic process. Paraspinal and other soft tissues: Negative. Disc levels: Multilevel discogenic degenerative changes with loss of intervertebral disc space height greatest at the L2-3, L3-4, and L5-S1 levels. Advanced lower lumbar facet arthrosis. Disc and facet degenerative changes contribute to right-sided L3-4 and L4-5 foraminal stenosis as well as left-sided L3-S1  foraminal stenosis. Mild-to-moderate spinal canal stenosis at the L3-4 and L4-5 levels. Abdomen and pelvis findings: Lower chest: Stable peripheral and basilar lung fibrosis. Hepatobiliary: No focal liver abnormality is seen. No gallstones, gallbladder wall thickening, or biliary dilatation. Pancreas: Unremarkable. No pancreatic ductal dilatation or surrounding inflammatory changes. Spleen: Normal in size without focal abnormality. Adrenals/Urinary Tract: Adrenal glands are unremarkable. Multiple stable renal cysts measuring up to 22 mm arising from left interpolar kidney. Kidneys are normal, without renal calculi, focal lesion, or hydronephrosis. Bladder is unremarkable. Stomach/Bowel: Diverticulum of gastric cardia. Otherwise stomach is within normal limits. Appendectomy. No evidence of bowel wall thickening, distention, or inflammatory changes. Extensive sigmoid diverticulosis, no findings of acute diverticulitis Vascular/Lymphatic: Aortic atherosclerosis. No enlarged abdominal or pelvic lymph nodes. Reproductive: Uterus and bilateral adnexa are unremarkable. Other: No abdominal wall hernia or abnormality. No abdominopelvic ascites. Musculoskeletal: No fracture is seen. IMPRESSION: CT lumbar spine: 1. No acute fracture or dislocation. 2. Stable mild lumbar levocurvature and mild-to-moderate multilevel spondylosis greatest at the L3-S1. CT abdomen and pelvis: 1. No acute process identified. 2. Extensive sigmoid diverticulosis, no findings of acute diverticulitis. 3.  Aortic Atherosclerosis (ICD10-I70.0). 4. Stable lung base fibrosis. Electronically Signed   By: Mitzi HansenLance  Furusawa-Stratton M.D.   On: 03/31/2018 05:52    Procedures Procedures (including critical care time)  Medications Ordered in ED Medications  fentaNYL (SUBLIMAZE) injection 50 mcg (has no administration in time range)     Initial Impression / Assessment and Plan / ED Course  I have reviewed the triage vital signs and the nursing  notes.  Pertinent labs & imaging results that were available during my care of the patient were reviewed by me and considered in my medical decision making (see chart for details).        Patient with right hip/flank pain x 2 days.  Denies any falls.  Moves all extremities.  Ambulates.  Recent admission for ischemic colitis.  Will check CT.  CT negative for stone, colitis, fracture, or other acute etiology.  Labs are reassuring. UA negative.  Patient seen by and discussed with Dr. Elesa MassedWard.  Unclear as to the etiology of the patient's pain, but at this time muscle strain is high on the differential.  I do not  feel that patient needs further ED workup and can be reasonably discharged home with PCP follow-up.  Final Clinical Impressions(s) / ED Diagnoses   Final diagnoses:  Back pain    ED Discharge Orders    None       Roxy Horseman, PA-C 03/31/18 [id number]    Ward, Layla Maw, DO 03/31/18 [phone]

## 2018-03-31 NOTE — ED Notes (Signed)
Charting error on this patient for Departure condition. Correct departure condition was done at 0728 by RN Alaina C.

## 2018-03-31 NOTE — ED Triage Notes (Addendum)
Per EMS, Pt, from home, c/o R groin pain radiating into R flank x 3 days.  Pain score 10/10.  Pt reports taking ibuprofen w/o relief.  Denies dysuria.  Pain is hard of hearing. Denies falling.    Upon assessment, Pt reports pain is in R hip and radiates into groin/upper leg w/ movement.

## 2018-03-31 NOTE — ED Notes (Signed)
Pt able to stand and take a few steps to use the bedside commode

## 2018-03-31 NOTE — ED Notes (Signed)
Verbalized understanding discharge instructions, prescriptions, and follow-up. In no acute distress.   

## 2018-03-31 NOTE — ED Notes (Signed)
Bed: UP10 Expected date:  Expected time:  Means of arrival:  Comments: 85 yo F/flank pain

## 2018-03-31 NOTE — ED Notes (Signed)
ED Provider at bedside. 

## 2018-03-31 NOTE — Discharge Instructions (Addendum)
Your may need to have an MRI of your back.  This would need to be scheduled on an outpatient basis, as they are not routinely done in the ER for back or hip pain.

## 2018-03-31 NOTE — ED Notes (Signed)
Patient transported to X-ray 

## 2018-06-09 ENCOUNTER — Emergency Department (HOSPITAL_COMMUNITY)
Admission: EM | Admit: 2018-06-09 | Discharge: 2018-06-10 | Disposition: A | Discharge status: Home / Self Care | Payer: Medicare Other | Attending: Emergency Medicine | Admitting: Emergency Medicine

## 2018-06-09 ENCOUNTER — Emergency Department (HOSPITAL_COMMUNITY): Payer: Medicare Other

## 2018-06-09 ENCOUNTER — Encounter (HOSPITAL_COMMUNITY): Payer: Self-pay | Admitting: Emergency Medicine

## 2018-06-09 ENCOUNTER — Other Ambulatory Visit: Payer: Self-pay

## 2018-06-09 DIAGNOSIS — J449 Chronic obstructive pulmonary disease, unspecified: Secondary | ICD-10-CM | POA: Diagnosis not present

## 2018-06-09 DIAGNOSIS — Z79899 Other long term (current) drug therapy: Secondary | ICD-10-CM | POA: Insufficient documentation

## 2018-06-09 DIAGNOSIS — R531 Weakness: Secondary | ICD-10-CM

## 2018-06-09 DIAGNOSIS — I129 Hypertensive chronic kidney disease with stage 1 through stage 4 chronic kidney disease, or unspecified chronic kidney disease: Secondary | ICD-10-CM | POA: Diagnosis not present

## 2018-06-09 DIAGNOSIS — R110 Nausea: Secondary | ICD-10-CM | POA: Insufficient documentation

## 2018-06-09 DIAGNOSIS — E039 Hypothyroidism, unspecified: Secondary | ICD-10-CM | POA: Diagnosis not present

## 2018-06-09 DIAGNOSIS — E1122 Type 2 diabetes mellitus with diabetic chronic kidney disease: Secondary | ICD-10-CM | POA: Insufficient documentation

## 2018-06-09 DIAGNOSIS — R109 Unspecified abdominal pain: Secondary | ICD-10-CM | POA: Diagnosis present

## 2018-06-09 DIAGNOSIS — Z20828 Contact with and (suspected) exposure to other viral communicable diseases: Secondary | ICD-10-CM | POA: Insufficient documentation

## 2018-06-09 DIAGNOSIS — N183 Chronic kidney disease, stage 3 (moderate): Secondary | ICD-10-CM | POA: Diagnosis not present

## 2018-06-09 LAB — CBC
HCT: 45 % (ref 36.0–46.0)
Hemoglobin: 14.6 g/dL (ref 12.0–15.0)
MCH: 32.3 pg (ref 26.0–34.0)
MCHC: 32.4 g/dL (ref 30.0–36.0)
MCV: 99.6 fL (ref 80.0–100.0)
Platelets: 260 10*3/uL (ref 150–400)
RBC: 4.52 MIL/uL (ref 3.87–5.11)
RDW: 13.2 % (ref 11.5–15.5)
WBC: 8.6 10*3/uL (ref 4.0–10.5)
nRBC: 0 % (ref 0.0–0.2)

## 2018-06-09 LAB — URINALYSIS, ROUTINE W REFLEX MICROSCOPIC
Bilirubin Urine: NEGATIVE
Glucose, UA: NEGATIVE mg/dL
Hgb urine dipstick: NEGATIVE
Ketones, ur: 5 mg/dL — AB
Leukocytes,Ua: NEGATIVE
Nitrite: NEGATIVE
Protein, ur: NEGATIVE mg/dL
Specific Gravity, Urine: 1.012 (ref 1.005–1.030)
pH: 6 (ref 5.0–8.0)

## 2018-06-09 LAB — COMPREHENSIVE METABOLIC PANEL
ALT: 12 U/L (ref 0–44)
AST: 18 U/L (ref 15–41)
Albumin: 4.1 g/dL (ref 3.5–5.0)
Alkaline Phosphatase: 65 U/L (ref 38–126)
Anion gap: 10 (ref 5–15)
BUN: 13 mg/dL (ref 8–23)
CO2: 26 mmol/L (ref 22–32)
Calcium: 9.5 mg/dL (ref 8.9–10.3)
Chloride: 99 mmol/L (ref 98–111)
Creatinine, Ser: 0.87 mg/dL (ref 0.44–1.00)
GFR calc Af Amer: >60 mL/min (ref >60)
GFR calc non Af Amer: 57 mL/min — ABNORMAL LOW (ref >60)
Glucose, Bld: 158 mg/dL — ABNORMAL HIGH (ref 70–99)
Potassium: 4.3 mmol/L (ref 3.5–5.1)
Sodium: 135 mmol/L (ref 135–145)
Total Bilirubin: 0.8 mg/dL (ref 0.3–1.2)
Total Protein: 7.7 g/dL (ref 6.5–8.1)

## 2018-06-09 LAB — DIFFERENTIAL
Abs Immature Granulocytes: 0.03 10*3/uL (ref 0.00–0.07)
Basophils Absolute: 0.1 10*3/uL (ref 0.0–0.1)
Basophils Relative: 1 %
Eosinophils Absolute: 0.3 10*3/uL (ref 0.0–0.5)
Eosinophils Relative: 4 %
Immature Granulocytes: 0 %
Lymphocytes Relative: 26 %
Lymphs Abs: 2.2 10*3/uL (ref 0.7–4.0)
Monocytes Absolute: 0.9 10*3/uL (ref 0.1–1.0)
Monocytes Relative: 11 %
Neutro Abs: 5.1 10*3/uL (ref 1.7–7.7)
Neutrophils Relative %: 58 %

## 2018-06-09 LAB — LIPASE, BLOOD: Lipase: 20 U/L (ref 11–51)

## 2018-06-09 LAB — TROPONIN I: Troponin I: <0.03 ng/mL (ref <0.03)

## 2018-06-09 MED ORDER — POLYETHYLENE GLYCOL 3350 17 G PO PACK: 17.0000 g | PACK | Freq: Every day | ORAL | 0 refills | Status: DC

## 2018-06-09 MED ORDER — ONDANSETRON 4 MG PO TBDP: 4.0000 mg | ORAL_TABLET | Freq: Three times a day (TID) | ORAL | 0 refills | Status: DC | PRN

## 2018-06-09 MED ORDER — POLYETHYLENE GLYCOL 3350 17 G PO PACK
17.0000 g | PACK | Freq: Every day | ORAL | Status: DC
  Administered 2018-06-09: 17 g via ORAL
  Filled 2018-06-09: qty 1

## 2018-06-09 MED ORDER — ONDANSETRON HCL 4 MG/2ML IJ SOLN
4.0000 mg | Freq: Once | INTRAMUSCULAR | Status: AC
  Administered 2018-06-09: 4 mg via INTRAVENOUS
  Filled 2018-06-09: qty 2

## 2018-06-09 MED ORDER — SODIUM CHLORIDE 0.9% FLUSH
3.0000 mL | Freq: Once | INTRAVENOUS | Status: AC
  Administered 2018-06-09: 3 mL via INTRAVENOUS

## 2018-06-09 NOTE — ED Triage Notes (Addendum)
Patient states she has abdominal pain and has been throwing up for the past few days. Patient has been having severe lower back pain and took two tramadol. At night time yesterday she had two more tramadol. Patient has not thrown up today. Patient has not had any tramadol.

## 2018-06-09 NOTE — ED Notes (Signed)
Pt transported to xray 

## 2018-06-09 NOTE — Discharge Instructions (Addendum)
We saw in the ER for weakness and nausea.  The lab work-up in the emergency room and the x-rays are normal.  We do notice that you constipated based on the x-rays.  If indeed the case, please take MiraLAX for constipation.  We have also tested you for COVID-19.  Results will take 2 days.  Return to the emergency room immediately if you start having worsening in shortness of breath, confusion, fainting spell, severe chest pain.      Person Under Monitoring Name: Shelia Black  Location: [place] [address]   Infection Prevention Recommendations for Individuals Confirmed to have, or Being Evaluated for, 2019 Novel Coronavirus (COVID-19) Infection Who Receive Care at Home  Individuals who are confirmed to have, or are being evaluated for, COVID-19 should follow the prevention steps below until a healthcare provider or local or state health department says they can return to normal activities.  Stay home except to get medical care You should restrict activities outside your home, except for getting medical care. Do not go to work, school, or public areas, and do not use public transportation or taxis.  Call ahead before visiting your doctor Before your medical appointment, call the healthcare provider and tell them that you have, or are being evaluated for, COVID-19 infection. This will help the healthcare providers office take steps to keep other people from getting infected. Ask your healthcare provider to call the local or state health department.  Monitor your symptoms Seek prompt medical attention if your illness is worsening (e.g., difficulty breathing). Before going to your medical appointment, call the healthcare provider and tell them that you have, or are being evaluated for, COVID-19 infection. Ask your healthcare provider to call the local or state health department.  Wear a facemask You should wear a facemask that covers your nose and mouth when  you are in the same room with other people and when you visit a healthcare provider. People who live with or visit you should also wear a facemask while they are in the same room with you.  Separate yourself from other people in your home As much as possible, you should stay in a different room from other people in your home. Also, you should use a separate bathroom, if available.  Avoid sharing household items You should not share dishes, drinking glasses, cups, eating utensils, towels, bedding, or other items with other people in your home. After using these items, you should wash them thoroughly with soap and water.  Cover your coughs and sneezes Cover your mouth and nose with a tissue when you cough or sneeze, or you can cough or sneeze into your sleeve. Throw used tissues in a lined trash can, and immediately wash your hands with soap and water for at least 20 seconds or use an alcohol-based hand rub.  Wash your [facility] your hands often and thoroughly with soap and water for at least 20 seconds. You can use an alcohol-based hand sanitizer if soap and water are not available and if your hands are not visibly dirty. Avoid touching your eyes, nose, and mouth with unwashed hands.   Prevention Steps for Caregivers and Household Members of Individuals Confirmed to have, or Being Evaluated for, COVID-19 Infection Being Cared for in the Home  If you live with, or provide care at home for, a person confirmed to have, or being evaluated for, COVID-19 infection please follow these guidelines to prevent infection:  Follow healthcare providers instructions Make sure that  you understand and can help the patient follow any healthcare provider instructions for all care.  Provide for the patients basic needs You should help the patient with basic needs in the home and provide support for getting groceries, prescriptions, and other personal needs.  Monitor the patients symptoms If they  are getting sicker, call his or her medical provider and tell them that the patient has, or is being evaluated for, COVID-19 infection. This will help the healthcare providers office take steps to keep other people from getting infected. Ask the healthcare provider to call the local or state health department.  Limit the number of people who have contact with the patient If possible, have only one caregiver for the patient. Other household members should stay in another home or place of residence. If this is not possible, they should stay in another room, or be separated from the patient as much as possible. Use a separate bathroom, if available. Restrict visitors who do not have an essential need to be in the home.  Keep older adults, very young children, and other sick people away from the patient Keep older adults, very young children, and those who have compromised immune systems or chronic health conditions away from the patient. This includes people with chronic heart, lung, or kidney conditions, diabetes, and cancer.  Ensure good ventilation Make sure that shared spaces in the home have good air flow, such as from an air conditioner or an opened window, weather permitting.  Wash your hands often Wash your hands often and thoroughly with soap and water for at least 20 seconds. You can use an alcohol based hand sanitizer if soap and water are not available and if your hands are not visibly dirty. Avoid touching your eyes, nose, and mouth with unwashed hands. Use disposable paper towels to dry your hands. If not available, use dedicated cloth towels and replace them when they become wet.  Wear a facemask and gloves Wear a disposable facemask at all times in the room and gloves when you touch or have contact with the patients blood, body fluids, and/or secretions or excretions, such as sweat, saliva, sputum, nasal mucus, vomit, urine, or feces.  Ensure the mask fits over your nose and  mouth tightly, and do not touch it during use. Throw out disposable facemasks and gloves after using them. Do not reuse. Wash your hands immediately after removing your facemask and gloves. If your personal clothing becomes contaminated, carefully remove clothing and launder. Wash your hands after handling contaminated clothing. Place all used disposable facemasks, gloves, and other waste in a lined container before disposing them with other household waste. Remove gloves and wash your hands immediately after handling these items.  Do not share dishes, glasses, or other household items with the patient Avoid sharing household items. You should not share dishes, drinking glasses, cups, eating utensils, towels, bedding, or other items with a patient who is confirmed to have, or being evaluated for, COVID-19 infection. After the person uses these items, you should wash them thoroughly with soap and water.  Wash laundry thoroughly Immediately remove and wash clothes or bedding that have blood, body fluids, and/or secretions or excretions, such as sweat, saliva, sputum, nasal mucus, vomit, urine, or feces, on them. Wear gloves when handling laundry from the patient. Read and follow directions on labels of laundry or clothing items and detergent. In general, wash and dry with the warmest temperatures recommended on the label.  Clean all areas the individual has used  often Clean all touchable surfaces, such as counters, tabletops, doorknobs, bathroom fixtures, toilets, phones, keyboards, tablets, and bedside tables, every day. Also, clean any surfaces that may have blood, body fluids, and/or secretions or excretions on them. Wear gloves when cleaning surfaces the patient has come in contact with. Use a diluted bleach solution (e.g., dilute bleach with 1 part bleach and 10 parts water) or a household disinfectant with a label that says EPA-registered for coronaviruses. To make a bleach solution at home,  add 1 tablespoon of bleach to 1 quart (4 cups) of water. For a larger supply, add  cup of bleach to 1 gallon (16 cups) of water. Read labels of cleaning products and follow recommendations provided on product labels. Labels contain instructions for safe and effective use of the cleaning product including precautions you should take when applying the product, such as wearing gloves or eye protection and making sure you have good ventilation during use of the product. Remove gloves and wash hands immediately after cleaning.  Monitor yourself for signs and symptoms of illness Caregivers and household members are considered close contacts, should monitor their health, and will be asked to limit movement outside of the home to the extent possible. Follow the monitoring steps for close contacts listed on the symptom monitoring form.   ? If you have additional questions, contact your local health department or call the epidemiologist on call at [phone] (available 24/7). ? This guidance is subject to change. For the most up-to-date guidance from [facility], please refer to their website: [email or web address]

## 2018-06-09 NOTE — ED Provider Notes (Signed)
[facility] DEPT Provider Note   CSN: [id number] Arrival date & time: 06/09/18  1925    History   Chief Complaint Chief Complaint  Patient presents with  . Abdominal Pain  . Emesis    HPI Shelia Black is a 85 y.o. female.     HPI  85 year old female with history of COPD, diabetes, diverticulitis comes in with chief complaint of nausea and mild abdominal discomfort.   Patient reports that for the last 2 or 3 days she has been having generalized abdominal discomfort with nausea.  She had emesis x2 yesterday.  Today she has not had any emesis, but she has been feeling weak and unwell, therefore she decided to come to the ER for further evaluation.  Last night patient was having back pain and took more tramadol than she normally takes.  She denies any lower extremity weakness, numbness.  Review of system is negative for any headaches, chest pain, new cough, worsening shortness of breath, fevers, chills, diaphoresis.  Patient has not been around anyone sick.  I called [name], patient's daughter and she confirms the history provided by the patient.  I also asked her about patient's pulmonary status.  Patient tells me that she has no lung disease -however she has been prescribed albuterol in the past and her records indicate diagnosis of both COPD and pulmonary fibrosis.  Daughter reports that patient has inhaler but she does not usually have to take them.  Past Medical History:  Diagnosis Date  . Arthritis   . Cataract   . Chronic cough   . COPD (chronic obstructive pulmonary disease) ([place])   . Diabetes mellitus without complication ([place])   . Diverticulitis   . Diverticulosis   . GERD (gastroesophageal reflux disease)   . Hepatic steatosis   . Hiatal hernia   . Hypothyroidism   . ILD (interstitial lung disease) ([employer])   . Pulmonary fibrosis ([place])   . Thyroid disease   . Vertigo     Patient Active Problem List   Diagnosis Date Noted   . Ischemic colitis ([employer]) 03/14/2018  . Colitis 03/13/2018  . Colitis with rectal bleeding 03/12/2018  . Sepsis ([place]) 05/23/2016  . Diverticulitis 05/23/2016  . GERD (gastroesophageal reflux disease) 05/23/2016  . Hypothyroidism 05/23/2016  . Depression 05/23/2016  . ILD (interstitial lung disease) ([place])   . Pulmonary fibrosis ([place])   . Acute UTI   . UTI (lower urinary tract infection)   . Hyperglycemia 02/20/2015  . Diabetes mellitus type 2 in obese ([place]) 02/20/2015  . CKD (chronic kidney disease) stage 3, GFR 30-59 ml/min ([facility]) 02/20/2015  . UTI (urinary tract infection) 04/29/2014  . Diverticulitis of colon with bleeding 04/29/2014  . Acute respiratory failure with hypoxia ([place]) 04/29/2014  . Acute renal failure ([place]) 04/29/2014  . Hyperkalemia 04/29/2014  . Nausea and vomiting 04/29/2014    Past Surgical History:  Procedure Laterality Date  . APPENDECTOMY    . INTRAOCULAR LENS INSERTION  1997  . REPLACEMENT TOTAL KNEE BILATERAL    . SHOULDER SURGERY Right    tendonitis     OB History   No obstetric history on file.      Home Medications    Prior to Admission medications   Medication Sig Start Date End Date Taking? Authorizing Provider  acetaminophen (TYLENOL) 500 MG tablet Take 500 mg by mouth every 6 (six) hours as needed for moderate pain. 1 in the morning and 2 at night    [provider]  albuterol (PROVENTIL HFA;VENTOLIN HFA) 108 (90 Base) MCG/ACT inhaler Inhale 1 puff into the lungs every 6 (six) hours as needed for wheezing or shortness of breath. 05/27/16   Doreatha Lew, MD  ammonium lactate (LAC-HYDRIN) 12 % lotion Apply 1 application topically as needed for dry skin.    [provider]  Calcium Carbonate (CALCIUM 600 PO) Take 1 tablet by mouth 2 (two) times daily.    [provider]  chlorpheniramine-HYDROcodone (TUSSIONEX) 10-8 MG/5ML SUER Take 5 mLs by mouth every 12 (twelve) hours as needed for cough.  01/14/18    [provider]  Cholecalciferol (VITAMIN D-3) 5000 units TABS Take 1 tablet by mouth daily.    [provider]  diphenoxylate-atropine (LOMOTIL) 2.5-0.025 MG tablet Take 1 tablet by mouth 4 (four) times daily as needed for diarrhea or loose stools. diarrhea 11/11/12   [provider]  HYDROcodone-acetaminophen (NORCO/VICODIN) 5-325 MG tablet Take 1-2 tablets by mouth every 6 (six) hours as needed. 03/31/18   Montine Circle, PA-C  levothyroxine (SYNTHROID, LEVOTHROID) 125 MCG tablet Take 125 mcg by mouth daily before breakfast.    [provider]  omeprazole (PRILOSEC) 40 MG capsule Take 1 capsule (40 mg total) by mouth daily. 03/15/18   Eugenie Filler, MD  ondansetron (ZOFRAN ODT) 4 MG disintegrating tablet Take 1 tablet (4 mg total) by mouth every 8 (eight) hours as needed for nausea or vomiting. 06/09/18   Varney Biles, MD  polyethylene glycol (MIRALAX / GLYCOLAX) 17 g packet Take 17 g by mouth daily. 06/09/18   Varney Biles, MD  predniSONE (DELTASONE) 10 MG tablet Take 5 tablets (50 mg total) by mouth daily. 06/10/18   Varney Biles, MD  Probiotic Product (ALIGN PO) Take 1 tablet by mouth daily.    [provider]  vitamin C (ASCORBIC ACID) 500 MG tablet Take 500 mg by mouth daily.    [provider]    Family History Family History  Problem Relation Age of Onset  . Diabetes Mother   . Kidney cancer Mother   . Hypertension Father   . Supraventricular tachycardia Father   . Skin cancer Father   . Diabetes Sister     Social History Social History   Tobacco Use  . Smoking status: Never Smoker  . Smokeless tobacco: Never Used  Substance Use Topics  . Alcohol use: No  . Drug use: No     Allergies   Oxycodone-acetaminophen; Penicillins; Codeine; and Percocet [oxycodone-acetaminophen]   Review of Systems Review of Systems  Constitutional: Positive for activity change and fatigue.  Respiratory: Negative for wheezing.    Cardiovascular: Negative for chest pain.  Gastrointestinal: Positive for abdominal pain and nausea.  Allergic/Immunologic: Negative for immunocompromised state.  Hematological: Does not bruise/bleed easily.  All other systems reviewed and are negative.    Physical Exam Updated Vital Signs BP 137/78 (BP Location: Left Arm)   Pulse 63   Temp 97.9 F (36.6 C) (Oral)   Resp (!) 23   Ht 5' 4"  (1.626 m)   Wt 63.5 kg   LMP  (LMP Unknown)   SpO2 92%   BMI 24.03 kg/m   Physical Exam Vitals signs and nursing note reviewed.  Constitutional:      Appearance: She is well-developed.  HENT:     Head: Normocephalic and atraumatic.  Neck:     Musculoskeletal: Normal range of motion and neck supple.  Cardiovascular:     Rate and Rhythm: Normal rate.  Pulmonary:  Effort: Pulmonary effort is normal.     Breath sounds: Rales present. No wheezing or rhonchi.     Comments: Diffuse rales in the lower lung base Abdominal:     General: Bowel sounds are normal.     Palpations: Abdomen is soft.     Tenderness: There is no abdominal tenderness.  Skin:    General: Skin is warm and dry.  Neurological:     Mental Status: She is alert and oriented to person, place, and time.      ED Treatments / Results  Labs (all labs ordered are listed, but only abnormal results are displayed) Labs Reviewed  COMPREHENSIVE METABOLIC PANEL - Abnormal; Notable for the following components:      Result Value   Glucose, Bld 158 (*)    GFR calc non Af Amer 57 (*)    All other components within normal limits  URINALYSIS, ROUTINE W REFLEX MICROSCOPIC - Abnormal; Notable for the following components:   Ketones, ur 5 (*)    All other components within normal limits  URINE CULTURE  NOVEL CORONAVIRUS, NAA (HOSPITAL ORDER, SEND-OUT TO REF LAB)  LIPASE, BLOOD  CBC  TROPONIN I  DIFFERENTIAL    EKG EKG Interpretation  Date/Time:  Tuesday Jun 09 2018 21:56:23 EDT Ventricular Rate:  66 PR Interval:     QRS Duration: 95 QT Interval:  417 QTC Calculation: 437 R Axis:   -21 Text Interpretation:  Sinus rhythm Borderline left axis deviation No acute changes Confirmed by Varney Biles [phone]) on 06/09/2018 11:19:18 PM   Radiology Dg Abd Acute 2+v W 1v Chest  Result Date: 06/09/2018 CLINICAL DATA:  Nausea and emesis EXAM: DG ABDOMEN ACUTE W/ 1V CHEST COMPARISON:  Chest x-ray 05/24/2016, CT 03/31/2018 FINDINGS: Single-view chest demonstrates bibasilar fibrosis. Mild cardiomegaly with aortic atherosclerosis. No focal consolidation or pleural effusion. Supine and decubitus views of the abdomen. Nonobstructed gas pattern with large amount of stool in the colon. No gross free air allowing for limited decubitus view. IMPRESSION: 1. Mild cardiomegaly and bibasilar fibrosis. 2. Nonobstructed gas pattern with moderate to large stool in the colon Electronically Signed   By: Donavan Foil M.D.   On: 06/09/2018 21:56    Procedures Procedures (including critical care time)  Medications Ordered in ED Medications  polyethylene glycol (MIRALAX / GLYCOLAX) packet 17 g (17 g Oral Given 06/09/18 2348)  sodium chloride flush (NS) 0.9 % injection 3 mL (3 mLs Intravenous Given 06/09/18 2156)  ondansetron (ZOFRAN) injection 4 mg (4 mg Intravenous Given 06/09/18 2152)     Initial Impression / Assessment and Plan / ED Course  I have reviewed the triage vital signs and the nursing notes.  Pertinent labs & imaging results that were available during my care of the patient were reviewed by me and considered in my medical decision making (see chart for details).  Clinical Course as of Jun 10 10  Tue Jun 09, 2018  2359 Results of the ER work-up discussed with the patient and her daughter [name]. They are aware that the acute abdominal series is showing some signs of constipation, otherwise CBC, CMP, lipase troponin, UA are all within normal limits. Patient's O2 sats has been in the low 90s.  During my evaluation her  heart rate was in the 70s and O2 sats were 92%.  Patient denied any shortness of breath, wheezing or cough.  Her exam did not reveal any evidence of wheezing.  I informed patient's lung findings to the daughter.  She  confirms that there has been no sick contacts, however she has been going to the grocery store herself and lives with her mother.  Neither the patient nor the daughter have any symptoms of new cough or shortness of breath.  Given that I do not have COPD exacerbation as her main diagnosis, and she has vague symptoms of nausea, fatigue, abdominal discomfort and O2 sats in the low 90s -we have agreed to get her tested for COVID-19.  Strict ER return precautions have been discussed with the patient and the daughter.  She will be brought back to the emergency room if the shortness of breath gets worse, if there is any new chest pains, confusion, fevers.   [AN]    Clinical Course User Index [AN] Varney Biles, MD       85 year old female comes in a chief complaint of weakness and nausea. Although she mentions some abdominal discomfort, her exam is completely benign.  She has no focal tenderness or any evidence of peritoneal findings.  She is noted to have low O2 sats at 92% and has history of COPD-pulmonary fibrosis.  On exam she has rales but there is no wheezing or rhonchi.  Basic labs ordered.  Differential diagnosis includes constipation, ACS, electrolyte abnormality, AKI, pneumonia, UTI.   Shelia Black was evaluated in Emergency Department on 06/10/2018 for the symptoms described in the history of present illness. She was evaluated in the context of the global COVID-19 pandemic, which necessitated consideration that the patient might be at risk for infection with the SARS-CoV-2 virus that causes COVID-19. Institutional protocols and algorithms that pertain to the evaluation of patients at risk for COVID-19 are in a state of rapid change based on information released by regulatory  bodies including the CDC and federal and state organizations. These policies and algorithms were followed during the patient's care in the ED.   12:12 AM Daughter made aware that we will be adding 3 days of prednisone to her discharge prescription after the discharge paperwork was printed.  Final Clinical Impressions(s) / ED Diagnoses   Final diagnoses:  Weakness  Nausea    ED Discharge Orders         Ordered    predniSONE (DELTASONE) 10 MG tablet  Daily     06/10/18 0008    ondansetron (ZOFRAN ODT) 4 MG disintegrating tablet  Every 8 hours PRN     06/09/18 2356    polyethylene glycol (MIRALAX / GLYCOLAX) 17 g packet  Daily     06/09/18 2356           Varney Biles, MD 06/10/18 0012

## 2018-06-09 NOTE — ED Notes (Signed)
Pt given gingerale for PO challenge. Pt has callbell within reach if pt needs anything.

## 2018-06-10 LAB — URINE CULTURE: Culture: NO GROWTH

## 2018-06-10 MED ORDER — PREDNISONE 10 MG PO TABS: 50.0000 mg | ORAL_TABLET | Freq: Every day | ORAL | 0 refills | Status: DC

## 2018-06-11 LAB — NOVEL CORONAVIRUS, NAA (HOSP ORDER, SEND-OUT TO REF LAB; TAT 18-24 HRS): SARS-CoV-2, NAA: NOT DETECTED

## 2018-08-03 ENCOUNTER — Emergency Department (HOSPITAL_COMMUNITY): Payer: Medicare Other

## 2018-08-03 ENCOUNTER — Other Ambulatory Visit: Payer: Self-pay

## 2018-08-03 ENCOUNTER — Emergency Department (HOSPITAL_COMMUNITY)
Admission: EM | Admit: 2018-08-03 | Discharge: 2018-08-03 | Disposition: A | Discharge status: Home / Self Care | Payer: Medicare Other | Attending: Emergency Medicine | Admitting: Emergency Medicine

## 2018-08-03 ENCOUNTER — Encounter (HOSPITAL_COMMUNITY): Payer: Self-pay | Admitting: Emergency Medicine

## 2018-08-03 DIAGNOSIS — J449 Chronic obstructive pulmonary disease, unspecified: Secondary | ICD-10-CM | POA: Insufficient documentation

## 2018-08-03 DIAGNOSIS — E039 Hypothyroidism, unspecified: Secondary | ICD-10-CM | POA: Insufficient documentation

## 2018-08-03 DIAGNOSIS — E1122 Type 2 diabetes mellitus with diabetic chronic kidney disease: Secondary | ICD-10-CM | POA: Insufficient documentation

## 2018-08-03 DIAGNOSIS — R2231 Localized swelling, mass and lump, right upper limb: Secondary | ICD-10-CM | POA: Insufficient documentation

## 2018-08-03 DIAGNOSIS — M19031 Primary osteoarthritis, right wrist: Secondary | ICD-10-CM | POA: Diagnosis not present

## 2018-08-03 DIAGNOSIS — Z96652 Presence of left artificial knee joint: Secondary | ICD-10-CM | POA: Diagnosis not present

## 2018-08-03 DIAGNOSIS — M25531 Pain in right wrist: Secondary | ICD-10-CM | POA: Diagnosis present

## 2018-08-03 DIAGNOSIS — N183 Chronic kidney disease, stage 3 (moderate): Secondary | ICD-10-CM | POA: Diagnosis not present

## 2018-08-03 DIAGNOSIS — Z79899 Other long term (current) drug therapy: Secondary | ICD-10-CM | POA: Insufficient documentation

## 2018-08-03 DIAGNOSIS — Z96651 Presence of right artificial knee joint: Secondary | ICD-10-CM | POA: Insufficient documentation

## 2018-08-03 MED ORDER — HYDROCODONE-ACETAMINOPHEN 5-325 MG PO TABS: 1.0000 | ORAL_TABLET | Freq: Four times a day (QID) | ORAL | 0 refills | Status: DC | PRN

## 2018-08-03 MED ORDER — HYDROCODONE-ACETAMINOPHEN 5-325 MG PO TABS
1.0000 | ORAL_TABLET | Freq: Once | ORAL | Status: AC
  Administered 2018-08-03: 1 via ORAL
  Filled 2018-08-03: qty 1

## 2018-08-03 NOTE — Discharge Instructions (Signed)
Keep the Velcro splint on you can remove it to bathe.  Take the hydrocodone as directed for pain.  You can take 1 or 2 tablets.  Make an appointment to follow-up with hand surgery or your regular doctor.  Return for any new or worse symptoms.  X-rays of the the right wrist are consistent with arthritis as the cause of the pain.

## 2018-08-03 NOTE — ED Triage Notes (Signed)
Pt c/o right hand pains for 4 days. Denies injuries.

## 2018-08-03 NOTE — ED Provider Notes (Signed)
[facility] DEPT Provider Note   CSN: [id number] Arrival date & time: 08/03/18  1607    History   Chief Complaint Chief Complaint  Patient presents with  . Hand Pain    HPI Shelia Black is a 85 y.o. female.     Patient with complaint of right wrist pain for about a week.  No fall or injury.  Patient states it hurts a lot to move the right wrist.  There is some swelling there and some redness.  Patient denies any prior history of anything similar.     Past Medical History:  Diagnosis Date  . Arthritis   . Cataract   . Chronic cough   . COPD (chronic obstructive pulmonary disease) ([place])   . Diabetes mellitus without complication ([place])   . Diverticulitis   . Diverticulosis   . GERD (gastroesophageal reflux disease)   . Hepatic steatosis   . Hiatal hernia   . Hypothyroidism   . ILD (interstitial lung disease) ([place])   . Pulmonary fibrosis ([place])   . Thyroid disease   . Vertigo     Patient Active Problem List   Diagnosis Date Noted  . Ischemic colitis ([place]) 03/14/2018  . Colitis 03/13/2018  . Colitis with rectal bleeding 03/12/2018  . Sepsis ([place]) 05/23/2016  . Diverticulitis 05/23/2016  . GERD (gastroesophageal reflux disease) 05/23/2016  . Hypothyroidism 05/23/2016  . Depression 05/23/2016  . ILD (interstitial lung disease) ([place])   . Pulmonary fibrosis ([place])   . Acute UTI   . UTI (lower urinary tract infection)   . Hyperglycemia 02/20/2015  . Diabetes mellitus type 2 in obese ([place]) 02/20/2015  . CKD (chronic kidney disease) stage 3, GFR 30-59 ml/min (HCC) 02/20/2015  . UTI (urinary tract infection) 04/29/2014  . Diverticulitis of colon with bleeding 04/29/2014  . Acute respiratory failure with hypoxia ([place]) 04/29/2014  . Acute renal failure ([place]) 04/29/2014  . Hyperkalemia 04/29/2014  . Nausea and vomiting 04/29/2014    Past Surgical History:  Procedure Laterality Date  . APPENDECTOMY    . INTRAOCULAR LENS  INSERTION  1997  . REPLACEMENT TOTAL KNEE BILATERAL    . SHOULDER SURGERY Right    tendonitis     OB History   No obstetric history on file.      Home Medications    Prior to Admission medications   Medication Sig Start Date End Date Taking? Authorizing Provider  acetaminophen (TYLENOL) 500 MG tablet Take 500-1,000 mg by mouth every 6 (six) hours as needed for moderate pain.    Yes [provider]  ascorbic acid (VITAMIN C) 250 MG CHEW Chew 250 mg by mouth daily.   Yes [provider]  Calcium Carbonate (CALCIUM 600 PO) Take 1 tablet by mouth 2 (two) times daily.   Yes [provider]  Cholecalciferol (VITAMIN D-3) 5000 units TABS Take 1 tablet by mouth daily.   Yes [provider]  diclofenac sodium (VOLTAREN) 1 % GEL Apply 2 g topically 4 (four) times daily as needed (pain).   Yes [provider]  diphenoxylate-atropine (LOMOTIL) 2.5-0.025 MG tablet Take 2 tablets by mouth 4 (four) times daily as needed for diarrhea or loose stools. diarrhea 11/11/12  Yes [provider]  levothyroxine (SYNTHROID, LEVOTHROID) 125 MCG tablet Take 125 mcg by mouth daily before breakfast.   Yes [provider]  Olopatadine HCl 0.2 % SOLN Apply 1 drop to eye daily as needed (allergies).   Yes [provider]  omeprazole ([place])  40 MG capsule Take 1 capsule (40 mg total) by mouth daily. 03/15/18  Yes Eugenie Filler, MD  Polyethyl Glycol-Propyl Glycol (SYSTANE) 0.4-0.3 % SOLN Apply 1 drop to eye daily as needed (dry eyes).   Yes [provider]  Probiotic Product (ALIGN PO) Take 1 tablet by mouth daily.   Yes [provider]  albuterol (PROVENTIL HFA;VENTOLIN HFA) 108 (90 Base) MCG/ACT inhaler Inhale 1 puff into the lungs every 6 (six) hours as needed for wheezing or shortness of breath. 05/27/16   Doreatha Lew, MD  ammonium lactate (LAC-HYDRIN) 12 % lotion Apply 1 application topically daily as needed for dry  skin.     [provider]  chlorpheniramine-HYDROcodone (TUSSIONEX) 10-8 MG/5ML SUER Take 5 mLs by mouth every 12 (twelve) hours as needed for cough.  01/14/18   [provider]  HYDROcodone-acetaminophen (NORCO/VICODIN) 5-325 MG tablet Take 1-2 tablets by mouth every 6 (six) hours as needed. Patient not taking: Reported on 08/03/2018 03/31/18   Montine Circle, PA-C  HYDROcodone-acetaminophen (NORCO/VICODIN) 5-325 MG tablet Take 1 tablet by mouth every 6 (six) hours as needed for moderate pain. 08/03/18   Fredia Sorrow, MD  ondansetron (ZOFRAN ODT) 4 MG disintegrating tablet Take 1 tablet (4 mg total) by mouth every 8 (eight) hours as needed for nausea or vomiting. 06/09/18   Varney Biles, MD  polyethylene glycol (MIRALAX / GLYCOLAX) 17 g packet Take 17 g by mouth daily. Patient not taking: Reported on 08/03/2018 06/09/18   Varney Biles, MD  predniSONE (DELTASONE) 10 MG tablet Take 5 tablets (50 mg total) by mouth daily. Patient not taking: Reported on 08/03/2018 06/10/18   Varney Biles, MD    Family History Family History  Problem Relation Age of Onset  . Diabetes Mother   . Kidney cancer Mother   . Hypertension Father   . Supraventricular tachycardia Father   . Skin cancer Father   . Diabetes Sister     Social History Social History   Tobacco Use  . Smoking status: Never Smoker  . Smokeless tobacco: Never Used  Substance Use Topics  . Alcohol use: No  . Drug use: No     Allergies   Oxycodone-acetaminophen, Penicillins, Codeine, Percocet [oxycodone-acetaminophen], and Tramadol   Review of Systems Review of Systems  Constitutional: Negative for chills and fever.  HENT: Negative for congestion, rhinorrhea and sore throat.   Eyes: Negative for visual disturbance.  Respiratory: Negative for cough and shortness of breath.   Cardiovascular: Negative for chest pain and leg swelling.  Gastrointestinal: Negative for abdominal pain, diarrhea, nausea and  vomiting.  Genitourinary: Negative for dysuria.  Musculoskeletal: Positive for joint swelling. Negative for back pain and neck pain.  Skin: Negative for rash.  Neurological: Negative for dizziness, light-headedness and headaches.  Hematological: Does not bruise/bleed easily.  Psychiatric/Behavioral: Negative for confusion.     Physical Exam Updated Vital Signs BP 139/79 (BP Location: Left Arm)   Pulse 62   Temp 97.7 F (36.5 C) (Oral)   Resp 16   LMP  (LMP Unknown)   SpO2 96%   Physical Exam Vitals signs and nursing note reviewed.  Constitutional:      General: She is not in acute distress.    Appearance: She is well-developed.  HENT:     Head: Normocephalic and atraumatic.  Eyes:     Extraocular Movements: Extraocular movements intact.     Conjunctiva/sclera: Conjunctivae normal.     Pupils: Pupils are equal, round, and reactive to light.  Neck:     Musculoskeletal: Normal range of motion and neck supple.  Cardiovascular:     Rate and Rhythm: Normal rate and regular rhythm.     Heart sounds: No murmur.  Pulmonary:     Effort: Pulmonary effort is normal. No respiratory distress.     Breath sounds: Normal breath sounds.  Abdominal:     General: Bowel sounds are normal.     Palpations: Abdomen is soft.     Tenderness: There is no abdominal tenderness.  Musculoskeletal: Normal range of motion.        General: Swelling and tenderness present.     Comments: Some redness to the radial aspect of the right wrist.  With some swelling radial pulses 2+.  Good cap refill to the fingers.  Pain with range of motion of the wrist and the right thumb.  Good range of motion at the elbow and shoulder.  Skin:    General: Skin is warm and dry.  Neurological:     General: No focal deficit present.     Mental Status: She is alert and oriented to person, place, and time.      ED Treatments / Results  Labs (all labs ordered are listed, but only abnormal results are displayed) Labs  Reviewed - No data to display  EKG None  Radiology Dg Hand Complete Right  Result Date: 08/03/2018 CLINICAL DATA:  85 year old female with 4 days of right hand pain, thumb and index finger pain. EXAM: RIGHT HAND - COMPLETE 3+ VIEW COMPARISON:  None. FINDINGS: Right index finger pulse oximeter in place. Distal radius and ulna intact. Carpal bone alignment within normal limits. Mild osteoarthritis along the radial aspect of the carpal bones and at the 1st [facility] joint with joint space loss and subchondral sclerosis. MCP joints are normal for age. There is IP joint osteoarthritis diffusely. No acute osseous abnormality identified. No discrete soft tissue abnormality. IMPRESSION: Osteoarthritis.  No acute osseous abnormality identified. Electronically Signed   By: Genevie Ann M.D.   On: 08/03/2018 20:03    Procedures Procedures (including critical care time)  Medications Ordered in ED Medications  HYDROcodone-acetaminophen (NORCO/VICODIN) 5-325 MG per tablet 1 tablet (1 tablet Oral Given 08/03/18 2001)     Initial Impression / Assessment and Plan / ED Course  I have reviewed the triage vital signs and the nursing notes.  Pertinent labs & imaging results that were available during my care of the patient were reviewed by me and considered in my medical decision making (see chart for details).        Findings most consistent with an inflammatory arthritis.  May very well just be an osteoarthritis that is inflamed.  Based on the location I de Quervain's Tina's synovitis is a possibility.  But we will go ahead and put in a Velcro splint treated with hydrocodone have her follow-up with hand surgery and her regular doctor.  Final Clinical Impressions(s) / ED Diagnoses   Final diagnoses:  Arthritis of right wrist    ED Discharge Orders         Ordered    HYDROcodone-acetaminophen (NORCO/VICODIN) 5-325 MG tablet  Every 6 hours PRN,   Status:  Discontinued     08/03/18 2128     HYDROcodone-acetaminophen (NORCO/VICODIN) 5-325 MG tablet  Every 6 hours PRN     08/03/18 2131           Fredia Sorrow, MD 08/03/18 2141

## 2018-08-03 NOTE — ED Notes (Signed)
An After Visit Summary was printed and given to the patient. No further questions at this time 

## 2018-08-05 ENCOUNTER — Encounter (HOSPITAL_COMMUNITY): Payer: Self-pay

## 2018-08-05 ENCOUNTER — Emergency Department (HOSPITAL_COMMUNITY): Payer: Medicare Other

## 2018-08-05 ENCOUNTER — Emergency Department (HOSPITAL_COMMUNITY)
Admission: EM | Admit: 2018-08-05 | Discharge: 2018-08-05 | Disposition: A | Discharge status: Home / Self Care | Payer: Medicare Other | Attending: Emergency Medicine | Admitting: Emergency Medicine

## 2018-08-05 ENCOUNTER — Other Ambulatory Visit: Payer: Self-pay

## 2018-08-05 DIAGNOSIS — M25531 Pain in right wrist: Secondary | ICD-10-CM | POA: Insufficient documentation

## 2018-08-05 DIAGNOSIS — R509 Fever, unspecified: Secondary | ICD-10-CM | POA: Diagnosis not present

## 2018-08-05 DIAGNOSIS — N183 Chronic kidney disease, stage 3 (moderate): Secondary | ICD-10-CM | POA: Diagnosis not present

## 2018-08-05 DIAGNOSIS — J449 Chronic obstructive pulmonary disease, unspecified: Secondary | ICD-10-CM | POA: Diagnosis not present

## 2018-08-05 DIAGNOSIS — M064 Inflammatory polyarthropathy: Secondary | ICD-10-CM | POA: Insufficient documentation

## 2018-08-05 DIAGNOSIS — Z20828 Contact with and (suspected) exposure to other viral communicable diseases: Secondary | ICD-10-CM | POA: Diagnosis not present

## 2018-08-05 DIAGNOSIS — E119 Type 2 diabetes mellitus without complications: Secondary | ICD-10-CM | POA: Diagnosis not present

## 2018-08-05 DIAGNOSIS — M1990 Unspecified osteoarthritis, unspecified site: Secondary | ICD-10-CM

## 2018-08-05 DIAGNOSIS — E039 Hypothyroidism, unspecified: Secondary | ICD-10-CM | POA: Insufficient documentation

## 2018-08-05 LAB — CBC WITH DIFFERENTIAL/PLATELET
Abs Immature Granulocytes: 0.04 10*3/uL (ref 0.00–0.07)
Basophils Absolute: 0 10*3/uL (ref 0.0–0.1)
Basophils Relative: 0 %
Eosinophils Absolute: 0 10*3/uL (ref 0.0–0.5)
Eosinophils Relative: 0 %
HCT: 40.2 % (ref 36.0–46.0)
Hemoglobin: 13.2 g/dL (ref 12.0–15.0)
Immature Granulocytes: 0 %
Lymphocytes Relative: 24 %
Lymphs Abs: 2.7 10*3/uL (ref 0.7–4.0)
MCH: 32.1 pg (ref 26.0–34.0)
MCHC: 32.8 g/dL (ref 30.0–36.0)
MCV: 97.8 fL (ref 80.0–100.0)
Monocytes Absolute: 1.6 10*3/uL — ABNORMAL HIGH (ref 0.1–1.0)
Monocytes Relative: 14 %
Neutro Abs: 6.8 10*3/uL (ref 1.7–7.7)
Neutrophils Relative %: 62 %
Platelets: 221 10*3/uL (ref 150–400)
RBC: 4.11 MIL/uL (ref 3.87–5.11)
RDW: 13.5 % (ref 11.5–15.5)
WBC: 11.1 10*3/uL — ABNORMAL HIGH (ref 4.0–10.5)
nRBC: 0 % (ref 0.0–0.2)

## 2018-08-05 LAB — COMPREHENSIVE METABOLIC PANEL
ALT: 12 U/L (ref 0–44)
AST: 15 U/L (ref 15–41)
Albumin: 3.6 g/dL (ref 3.5–5.0)
Alkaline Phosphatase: 52 U/L (ref 38–126)
Anion gap: 9 (ref 5–15)
BUN: 17 mg/dL (ref 8–23)
CO2: 25 mmol/L (ref 22–32)
Calcium: 9 mg/dL (ref 8.9–10.3)
Chloride: 100 mmol/L (ref 98–111)
Creatinine, Ser: 1.13 mg/dL — ABNORMAL HIGH (ref 0.44–1.00)
GFR calc Af Amer: 48 mL/min — ABNORMAL LOW (ref >60)
GFR calc non Af Amer: 42 mL/min — ABNORMAL LOW (ref >60)
Glucose, Bld: 171 mg/dL — ABNORMAL HIGH (ref 70–99)
Potassium: 4.1 mmol/L (ref 3.5–5.1)
Sodium: 134 mmol/L — ABNORMAL LOW (ref 135–145)
Total Bilirubin: 0.9 mg/dL (ref 0.3–1.2)
Total Protein: 7.3 g/dL (ref 6.5–8.1)

## 2018-08-05 LAB — SYNOVIAL CELL COUNT + DIFF, W/ CRYSTALS
Lymphocytes-Synovial Fld: 7 % (ref 0–20)
Monocyte-Macrophage-Synovial Fluid: 3 % — ABNORMAL LOW (ref 50–90)
Neutrophil, Synovial: 90 % — ABNORMAL HIGH (ref 0–25)
WBC, Synovial: 28380 /mm3 — ABNORMAL HIGH (ref 0–200)

## 2018-08-05 LAB — SARS CORONAVIRUS 2 BY RT PCR (HOSPITAL ORDER, PERFORMED IN ~~LOC~~ HOSPITAL LAB): SARS Coronavirus 2: NEGATIVE

## 2018-08-05 LAB — C-REACTIVE PROTEIN: CRP: 16.9 mg/dL — ABNORMAL HIGH (ref <1.0)

## 2018-08-05 LAB — SEDIMENTATION RATE: Sed Rate: 49 mm/hr — ABNORMAL HIGH (ref 0–22)

## 2018-08-05 MED ORDER — MORPHINE SULFATE (PF) 2 MG/ML IV SOLN
2.0000 mg | Freq: Once | INTRAVENOUS | Status: AC
  Administered 2018-08-05: 2 mg via INTRAVENOUS
  Filled 2018-08-05: qty 1

## 2018-08-05 MED ORDER — PREDNISONE 20 MG PO TABS
20.0000 mg | ORAL_TABLET | Freq: Once | ORAL | Status: AC
  Administered 2018-08-05: 22:00:00 20 mg via ORAL
  Filled 2018-08-05: qty 1

## 2018-08-05 MED ORDER — LORAZEPAM 2 MG/ML IJ SOLN
0.5000 mg | Freq: Once | INTRAMUSCULAR | Status: AC | PRN
  Administered 2018-08-05: 15:00:00 0.5 mg via INTRAVENOUS
  Filled 2018-08-05: qty 1

## 2018-08-05 MED ORDER — MORPHINE SULFATE 15 MG PO TABS: 15.0000 mg | ORAL_TABLET | Freq: Four times a day (QID) | ORAL | 0 refills | Status: AC | PRN

## 2018-08-05 MED ORDER — PREDNISONE 10 MG PO TABS: 20.0000 mg | ORAL_TABLET | Freq: Every day | ORAL | 0 refills | Status: AC

## 2018-08-05 MED ORDER — LIDOCAINE-EPINEPHRINE (PF) 2 %-1:200000 IJ SOLN
10.0000 mL | Freq: Once | INTRAMUSCULAR | Status: AC
  Administered 2018-08-05: 10 mL via INTRADERMAL
  Filled 2018-08-05: qty 10

## 2018-08-05 MED ORDER — GADOBUTROL 1 MMOL/ML IV SOLN
6.0000 mL | Freq: Once | INTRAVENOUS | Status: AC | PRN
  Administered 2018-08-05: 6 mL via INTRAVENOUS

## 2018-08-05 NOTE — Discharge Instructions (Signed)
You were seen in the emergency department today with wrist pain.  There is no sign of infection in the wrist.  We are starting steroid and additional pain medications.  Morphine can cause constipation and increase likelihood of falling.  Please take only the lowest dose necessary to have pain relief.  Use your Velcro wrist splint as needed for comfort.  We are also starting steroid over the next 5 days.  Call the primary care doctor tomorrow morning to arrange follow-up.  No further follow-up is needed with the orthopedic surgery group.

## 2018-08-05 NOTE — ED Notes (Signed)
Patient transported to X-ray 

## 2018-08-05 NOTE — ED Provider Notes (Signed)
Emergency Department Provider Note   I have reviewed the triage vital signs and the nursing notes.   HISTORY  Chief Complaint Hand Pain and Fever   HPI Shuntae Herzig is a 85 y.o. female with past medical history reviewed below who presents to the emergency department with worsening right wrist pain and fever last night.  Patient reports fever yesterday of 100 F.  She was initially seen on 6/29 with right wrist pain and redness.  She was discharged home with splint and pain medication symptoms thought to be secondary to inflammatory type process.  Patient states that he is been taking her pain medication but pain symptoms have worsened significantly.  She has severe pain with moving the wrist.  No fever today.  She now has redness over the hand and distal forearm.   Past Medical History:  Diagnosis Date   Arthritis    Cataract    Chronic cough    COPD (chronic obstructive pulmonary disease) (HCC)    Diabetes mellitus without complication (HCC)    Diverticulitis    Diverticulosis    GERD (gastroesophageal reflux disease)    Hepatic steatosis    Hiatal hernia    Hypothyroidism    ILD (interstitial lung disease) ([place])    Pulmonary fibrosis ([place])    Thyroid disease    Vertigo     Patient Active Problem List   Diagnosis Date Noted   Ischemic colitis ([place]) 03/14/2018   Colitis 03/13/2018   Colitis with rectal bleeding 03/12/2018   Sepsis ([place]) 05/23/2016   Diverticulitis 05/23/2016   GERD (gastroesophageal reflux disease) 05/23/2016   Hypothyroidism 05/23/2016   Depression 05/23/2016   ILD (interstitial lung disease) ([place])    Pulmonary fibrosis ([place])    Acute UTI    UTI (lower urinary tract infection)    Hyperglycemia 02/20/2015   Diabetes mellitus type 2 in obese ([place]) 02/20/2015   CKD (chronic kidney disease) stage 3, GFR 30-59 ml/min ([place]) 02/20/2015   UTI (urinary tract infection) 04/29/2014   Diverticulitis of colon with  bleeding 04/29/2014   Acute respiratory failure with hypoxia ([place]) 04/29/2014   Acute renal failure ([place]) 04/29/2014   Hyperkalemia 04/29/2014   Nausea and vomiting 04/29/2014    Past Surgical History:  Procedure Laterality Date   APPENDECTOMY     INTRAOCULAR LENS INSERTION  1997   REPLACEMENT TOTAL KNEE BILATERAL     SHOULDER SURGERY Right    tendonitis    Allergies Oxycodone-acetaminophen, Penicillins, Codeine, Percocet [oxycodone-acetaminophen], and Tramadol  Family History  Problem Relation Age of Onset   Diabetes Mother    Kidney cancer Mother    Hypertension Father    Supraventricular tachycardia Father    Skin cancer Father    Diabetes Sister     Social History Social History   Tobacco Use   Smoking status: Never Smoker   Smokeless tobacco: Never Used  Substance Use Topics   Alcohol use: No   Drug use: No    Review of Systems  Constitutional: No fever/chills Eyes: No visual changes. ENT: No sore throat. Cardiovascular: Denies chest pain. Respiratory: Denies shortness of breath. Gastrointestinal: No abdominal pain.  No nausea, no vomiting.  No diarrhea.  No constipation. Genitourinary: Negative for dysuria. Musculoskeletal: Negative for back pain. Positive right wrist pain.  Skin: Positive right hand/wrist redness.  Neurological: Negative for headaches, focal weakness or numbness.  10-point ROS otherwise negative.  ____________________________________________   PHYSICAL EXAM:  VITAL SIGNS: ED Triage Vitals [08/05/18 1223]  Enc Vitals  Group     BP 127/82     Pulse Rate 64     Resp 16     Temp 98.4 F (36.9 C)     Temp Source Oral     SpO2 93 %     Weight 139 lb 15.9 oz (63.5 kg)     Pain Score 10   Constitutional: Alert and oriented. Well appearing and in no acute distress. Eyes: Conjunctivae are normal.  Head: Atraumatic. Nose: No congestion/rhinnorhea. Mouth/Throat: Mucous membranes are moist.  Neck: No stridor.     Cardiovascular: Normal rate, regular rhythm. Good peripheral circulation. Grossly normal heart sounds.   Respiratory: Normal respiratory effort.  No retractions. Lungs CTAB. Gastrointestinal: Soft and nontender. No distention.  Musculoskeletal: Patient with exquisite tenderness of the right wrist with minimal movement.  Neurologic:  Normal speech and language.  Skin:  Skin is warm, dry and intact.  Erythema over the dorsum of the right hand, wrist, distal forearm.   ____________________________________________   LABS (all labs ordered are listed, but only abnormal results are displayed)  Labs Reviewed  COMPREHENSIVE METABOLIC PANEL - Abnormal; Notable for the following components:      Result Value   Sodium 134 (*)    Glucose, Bld 171 (*)    Creatinine, Ser 1.13 (*)    GFR calc non Af Amer 42 (*)    GFR calc Af Amer 48 (*)    All other components within normal limits  CBC WITH DIFFERENTIAL/PLATELET - Abnormal; Notable for the following components:   WBC 11.1 (*)    Monocytes Absolute 1.6 (*)    All other components within normal limits  SEDIMENTATION RATE - Abnormal; Notable for the following components:   Sed Rate 49 (*)    All other components within normal limits  C-REACTIVE PROTEIN - Abnormal; Notable for the following components:   CRP 16.9 (*)    All other components within normal limits  SYNOVIAL CELL COUNT + DIFF, W/ CRYSTALS - Abnormal; Notable for the following components:   Color, Synovial PINK (*)    Appearance-Synovial TURBID (*)    WBC, Synovial 28,380 (*)    Neutrophil, Synovial 90 (*)    Monocyte-Macrophage-Synovial Fluid 3 (*)    All other components within normal limits  CULTURE, BLOOD (ROUTINE X 2)  CULTURE, BLOOD (ROUTINE X 2)  SARS CORONAVIRUS 2 (HOSPITAL ORDER, [facility])  BODY FLUID CULTURE  GLUCOSE, BODY FLUID OTHER   ____________________________________________  RADIOLOGY  Dg Wrist Complete  Right  Result Date: 08/05/2018 CLINICAL DATA:  Wrist pain EXAM: RIGHT WRIST - COMPLETE 3+ VIEW COMPARISON:  08/03/2018 FINDINGS: Again noted are findings of osteoarthritis, greatest at the first carpometacarpal joint. There is soft tissue swelling about the wrist without evidence of an acute displaced fracture or dislocation. There is no radiopaque foreign body. IMPRESSION: No acute osseous abnormality. Nonspecific soft tissue swelling about the wrist. Electronically Signed   By: Constance Holster M.D.   On: 08/05/2018 14:08   Mr Wrist Right W Wo Contrast  Result Date: 08/05/2018 CLINICAL DATA:  Progressive right hand swelling with fever for 2 days. Possible septic arthritis. EXAM: MR OF THE RIGHT WRIST WITHOUT AND WITH CONTRAST TECHNIQUE: Multiplanar multisequence MR imaging of the right wrist was performed both before and after the administration of intravenous contrast. CONTRAST:  6 cc Gadavist COMPARISON:  Radiographs 08/05/2018. FINDINGS: Despite efforts by the technologist and patient, moderate motion artifact is present on today's exam and could not  be eliminated. This reduces exam sensitivity and specificity. Ligaments: Ligamentous evaluation is limited by the motion. The scapholunate and lunotriquetral ligaments are grossly intact. No corresponding diastasis. Triangular fibrocartilage: The triangular fibrocartilage complex appears intact. The ulnar variance is neutral. Tendons: The flexor and extensor tendons are intact. No significant tenosynovitis or abnormal enhancement associated with the tendon sheaths. Carpal tunnel/median nerve: Suboptimally evaluated due to motion. No gross abnormality. Guyon's canal: Suboptimally evaluated due to motion. No gross abnormality. Joint/cartilage: There are small to moderate radiocarpal and midcarpal compartment effusions with diffuse synovial enhancement following contrast.Similar findings are present at the 1st carpometacarpal joint. Bones/carpal alignment: There  are scattered erosions throughout the carpal bones and distal radius with associated enhancement following contrast. No cortical destruction identified.The carpal bone alignment appears normal. Other: There is soft tissue edema and enhancement surrounding the wrist. No focal fluid collections are seen. IMPRESSION: 1. The study is moderately motion degraded, limiting intra-articular detail. 2. Radiocarpal, midcarpal and 1st carpometacarpal joint effusions with diffuse enhancement consistent with synovitis. Scattered erosions throughout the carpal bones and distal radius. These findings support an inflammatory process. Differential considerations include rheumatoid arthritis and septic arthritis. 3. Periarticular soft tissue edema and enhancement without focal fluid collection. 4. No frank osteomyelitis identified. Electronically Signed   By: Richardean Sale M.D.   On: 08/05/2018 16:26    ____________________________________________   PROCEDURES  Procedure(s) performed:   Procedures  None ____________________________________________   INITIAL IMPRESSION / ASSESSMENT AND PLAN / ED COURSE  Pertinent labs & imaging results that were available during my care of the patient were reviewed by me and considered in my medical decision making (see chart for details).   Patient presents to the emergency department with right hand pain and swelling.  Exquisite tenderness wrist with even minimal movement.  Afebrile here but did take Tylenol prior to arrival.  No tachycardia.  Overlying skin changes of the wrist and hand seem most consistent with cellulitis but septic joint is a consideration.  Plain film pending along with labs including ESR/CRP.   02:30 PM  Spoke with Silvestre Gunner covering for Hand Surgery. Recommends MRI w and w/o contrast vs IR joint aspiration through window that does not involve the cellulitic area. Will proceed with MRI and reassess.  MRI reviewed and discussed with Dr. Grandville Silos.  He evaluated the patient and performed arthrocentesis. Findings without organisms and positive crystals. Plan for change in pain medication and steroid. Discussed meds with patient and daughter by phone. Discussed potential side effects of both and ED return precautions. PCP follow up from this point. No further ortho f/u needed.  ____________________________________________  FINAL CLINICAL IMPRESSION(S) / ED DIAGNOSES  Final diagnoses:  Right wrist pain  Inflammatory arthritis     MEDICATIONS GIVEN DURING THIS VISIT:  Medications  LORazepam (ATIVAN) injection 0.5 mg (0.5 mg Intravenous Given 08/05/18 1500)  gadobutrol (GADAVIST) 1 MMOL/ML injection 6 mL (6 mLs Intravenous Contrast Given 08/05/18 1557)  lidocaine-EPINEPHrine (XYLOCAINE W/EPI) 2 %-1:200000 (PF) injection 10 mL (10 mLs Intradermal Given by Other 08/05/18 1807)  morphine 2 MG/ML injection 2 mg (2 mg Intravenous Given 08/05/18 1949)  predniSONE (DELTASONE) tablet 20 mg (20 mg Oral Given 08/05/18 2151)     NEW OUTPATIENT MEDICATIONS STARTED DURING THIS VISIT:  Discharge Medication List as of 08/05/2018  9:29 PM    START taking these medications   Details  morphine (MSIR) 15 MG tablet Take 1 tablet (15 mg total) by mouth every 6 (six) hours as needed for up to  3 days for severe pain., Starting Wed 08/05/2018, Until Sat 08/08/2018, Normal        Note:  This document was prepared using Dragon voice recognition software and may include unintentional dictation errors.  Nanda Quinton, MD Emergency Medicine    Solita Macadam, [facility], MD 08/06/18 1037

## 2018-08-05 NOTE — Consult Note (Addendum)
ORTHOPAEDIC CONSULTATION HISTORY & PHYSICAL REQUESTING PHYSICIAN: Long, Wonda Olds, MD  Chief Complaint: right UE pain/swelling  HPI: Shelia Black is a 85 y.o. female who developed spontaneous swelling and redness of the right distal forearm and wrist region over the course of a week, leading to evaluation in the emergency department on Monday, 08-03-18, where she was discharged with a splint, hydrocodone, and instructions to follow-up with Dr. Lenon Curt, who was on-call for hand surgery, and her PCP.  There is no identified trauma or noted break in the integrity of the skin. Instead, she made an appointment with Dr. Amedeo Plenty, who was to see her today in the office, but she was not evaluated in the office due to her reported elevated temperature from home.  On initial evaluation in the ED, her temperature was noted to be 98.4. Some initial blood work has been obtained, MRI scan obtained and I have been consulted regarding her situation.  I conferred via telephone with her daughter, who thinks that she has not had any similar episodes previously in her life.  MRI scan revealed small effusions of the radiocarpal and midcarpal joints, as well as the first [facility] joint with diffuse synovial enhancement, in addition to scattered erosions throughout the carpal bones and distal radius with associated postcontrast enhancement.  There was no cortical destruction identified.  Soft tissue edema and enhancement surrounding the wrist without focal fluid collections.  Past Medical History:  Diagnosis Date  . Arthritis   . Cataract   . Chronic cough   . COPD (chronic obstructive pulmonary disease) ([place])   . Diabetes mellitus without complication ([place])   . Diverticulitis   . Diverticulosis   . GERD (gastroesophageal reflux disease)   . Hepatic steatosis   . Hiatal hernia   . Hypothyroidism   . ILD (interstitial lung disease) ([place])   . Pulmonary fibrosis ([place])   . Thyroid disease   . Vertigo    Past Surgical  History:  Procedure Laterality Date  . APPENDECTOMY    . INTRAOCULAR LENS INSERTION  1997  . REPLACEMENT TOTAL KNEE BILATERAL    . SHOULDER SURGERY Right    tendonitis   Social History   Socioeconomic History  . Marital status: Widowed    Spouse name: Not on file  . Number of children: 2  . Years of education: Not on file  . Highest education level: Not on file  Occupational History  . Occupation: retired  Scientific laboratory technician  . Financial resource strain: Not on file  . Food insecurity    Worry: Not on file    Inability: Not on file  . Transportation needs    Medical: Not on file    Non-medical: Not on file  Tobacco Use  . Smoking status: Never Smoker  . Smokeless tobacco: Never Used  Substance and Sexual Activity  . Alcohol use: No  . Drug use: No  . Sexual activity: Not Currently    Birth control/protection: None  Lifestyle  . Physical activity    Days per week: Not on file    Minutes per session: Not on file  . Stress: Not on file  Relationships  . Social Herbalist on phone: Not on file    Gets together: Not on file    Attends religious service: Not on file    Active member of club or organization: Not on file    Attends meetings of clubs or organizations: Not on file    Relationship status: Not  on file  Other Topics Concern  . Not on file  Social History Narrative  . Not on file   Family History  Problem Relation Age of Onset  . Diabetes Mother   . Kidney cancer Mother   . Hypertension Father   . Supraventricular tachycardia Father   . Skin cancer Father   . Diabetes Sister    Allergies  Allergen Reactions  . Oxycodone-Acetaminophen Nausea Only  . Penicillins Hives and Rash    Has patient had a PCN reaction causing immediate rash, facial/tongue/throat swelling, SOB or lightheadedness with hypotension: no Has patient had a PCN reaction causing severe rash involving mucus membranes or skin necrosis: unknown Has patient had a PCN reaction that  required hospitalization : unknown Has patient had a PCN reaction occurring within the last 10 years: no If all of the above answers are "NO", then may proceed with Cephalosporin use.   . Codeine     Nausea and vomiting  . Percocet [Oxycodone-Acetaminophen] Nausea And Vomiting  . Tramadol Nausea And Vomiting   Prior to Admission medications   Medication Sig Start Date End Date Taking? Authorizing Provider  acetaminophen (TYLENOL) 500 MG tablet Take 500-1,000 mg by mouth every 6 (six) hours as needed for moderate pain or fever.    Yes [provider]  ascorbic acid (VITAMIN C) 250 MG CHEW Chew 250 mg by mouth daily.   Yes [provider]  Calcium Carbonate (CALCIUM 600 PO) Take 1 tablet by mouth 2 (two) times daily.   Yes [provider]  Cholecalciferol (VITAMIN D-3) 5000 units TABS Take 1 tablet by mouth daily.   Yes [provider]  levothyroxine (SYNTHROID, LEVOTHROID) 125 MCG tablet Take 125 mcg by mouth daily before breakfast.   Yes [provider]  omeprazole (PRILOSEC) 40 MG capsule Take 1 capsule (40 mg total) by mouth daily. 03/15/18  Yes Eugenie Filler, MD  Probiotic Product (ALIGN PO) Take 1 tablet by mouth daily.   Yes [provider]  albuterol (PROVENTIL HFA;VENTOLIN HFA) 108 (90 Base) MCG/ACT inhaler Inhale 1 puff into the lungs every 6 (six) hours as needed for wheezing or shortness of breath. 05/27/16   Doreatha Lew, MD  ammonium lactate (LAC-HYDRIN) 12 % lotion Apply 1 application topically daily as needed for dry skin.     [provider]  chlorpheniramine-HYDROcodone (TUSSIONEX) 10-8 MG/5ML SUER Take 5 mLs by mouth every 12 (twelve) hours as needed for cough.  01/14/18   [provider]  diclofenac sodium (VOLTAREN) 1 % GEL Apply 2 g topically 4 (four) times daily as needed (pain).    [provider]  diphenoxylate-atropine (LOMOTIL) 2.5-0.025 MG tablet Take 2 tablets by mouth 4 (four)  times daily as needed for diarrhea or loose stools. diarrhea 11/11/12   [provider]  HYDROcodone-acetaminophen (NORCO/VICODIN) 5-325 MG tablet Take 1-2 tablets by mouth every 6 (six) hours as needed. Patient not taking: Reported on 08/03/2018 03/31/18   Montine Circle, PA-C  HYDROcodone-acetaminophen (NORCO/VICODIN) 5-325 MG tablet Take 1 tablet by mouth every 6 (six) hours as needed for moderate pain. 08/03/18   Fredia Sorrow, MD  Olopatadine HCl 0.2 % SOLN Apply 1 drop to eye daily as needed (allergies).    [provider]  ondansetron (ZOFRAN ODT) 4 MG disintegrating tablet Take 1 tablet (4 mg total) by mouth every 8 (eight) hours as needed for nausea or vomiting. 06/09/18   Varney Biles, MD  Polyethyl Glycol-Propyl Glycol (SYSTANE) 0.4-0.3 % SOLN  Apply 1 drop to eye daily as needed (dry eyes).    [provider]  polyethylene glycol (MIRALAX / GLYCOLAX) 17 g packet Take 17 g by mouth daily. Patient not taking: Reported on 08/03/2018 06/09/18   Varney Biles, MD  predniSONE (DELTASONE) 10 MG tablet Take 5 tablets (50 mg total) by mouth daily. Patient not taking: Reported on 08/03/2018 06/10/18   Varney Biles, MD   Dg Wrist Complete Right  Result Date: 08/05/2018 CLINICAL DATA:  Wrist pain EXAM: RIGHT WRIST - COMPLETE 3+ VIEW COMPARISON:  08/03/2018 FINDINGS: Again noted are findings of osteoarthritis, greatest at the first carpometacarpal joint. There is soft tissue swelling about the wrist without evidence of an acute displaced fracture or dislocation. There is no radiopaque foreign body. IMPRESSION: No acute osseous abnormality. Nonspecific soft tissue swelling about the wrist. Electronically Signed   By: Constance Holster M.D.   On: 08/05/2018 14:08   Mr Wrist Right W Wo Contrast  Result Date: 08/05/2018 CLINICAL DATA:  Progressive right hand swelling with fever for 2 days. Possible septic arthritis. EXAM: MR OF THE RIGHT WRIST WITHOUT AND WITH CONTRAST  TECHNIQUE: Multiplanar multisequence MR imaging of the right wrist was performed both before and after the administration of intravenous contrast. CONTRAST:  6 cc Gadavist COMPARISON:  Radiographs 08/05/2018. FINDINGS: Despite efforts by the technologist and patient, moderate motion artifact is present on today's exam and could not be eliminated. This reduces exam sensitivity and specificity. Ligaments: Ligamentous evaluation is limited by the motion. The scapholunate and lunotriquetral ligaments are grossly intact. No corresponding diastasis. Triangular fibrocartilage: The triangular fibrocartilage complex appears intact. The ulnar variance is neutral. Tendons: The flexor and extensor tendons are intact. No significant tenosynovitis or abnormal enhancement associated with the tendon sheaths. Carpal tunnel/median nerve: Suboptimally evaluated due to motion. No gross abnormality. Guyon's canal: Suboptimally evaluated due to motion. No gross abnormality. Joint/cartilage: There are small to moderate radiocarpal and midcarpal compartment effusions with diffuse synovial enhancement following contrast.Similar findings are present at the 1st carpometacarpal joint. Bones/carpal alignment: There are scattered erosions throughout the carpal bones and distal radius with associated enhancement following contrast. No cortical destruction identified.The carpal bone alignment appears normal. Other: There is soft tissue edema and enhancement surrounding the wrist. No focal fluid collections are seen. IMPRESSION: 1. The study is moderately motion degraded, limiting intra-articular detail. 2. Radiocarpal, midcarpal and 1st carpometacarpal joint effusions with diffuse enhancement consistent with synovitis. Scattered erosions throughout the carpal bones and distal radius. These findings support an inflammatory process. Differential considerations include rheumatoid arthritis and septic arthritis. 3. Periarticular soft tissue edema and  enhancement without focal fluid collection. 4. No frank osteomyelitis identified. Electronically Signed   By: Richardean Sale M.D.   On: 08/05/2018 16:26   Dg Hand Complete Right  Result Date: 08/03/2018 CLINICAL DATA:  85 year old female with 4 days of right hand pain, thumb and index finger pain. EXAM: RIGHT HAND - COMPLETE 3+ VIEW COMPARISON:  None. FINDINGS: Right index finger pulse oximeter in place. Distal radius and ulna intact. Carpal bone alignment within normal limits. Mild osteoarthritis along the radial aspect of the carpal bones and at the 1st [facility] joint with joint space loss and subchondral sclerosis. MCP joints are normal for age. There is IP joint osteoarthritis diffusely. No acute osseous abnormality identified. No discrete soft tissue abnormality. IMPRESSION: Osteoarthritis.  No acute osseous abnormality identified. Electronically Signed   By: Genevie Ann M.D.   On: 08/03/2018 20:03    Positive ROS: All  other systems have been reviewed and were otherwise negative with the exception of those mentioned in the HPI and as above.  Physical Exam: Vitals: Refer to EMR. Constitutional:  WD, WN, NAD HEENT:  NCAT, EOMI Neuro/Psych:  Alert & oriented to person, place, and time; appropriate mood & affect Lymphatic: No generalized extremity edema or lymphadenopathy Extremities / MSK:  The extremities are normal with respect to appearance, ranges of motion, joint stability, muscle strength/tone, sensation, & perfusion except as otherwise noted:  The right wrist region is mild to moderately swollen.  It is held in neutral, and the digits are held extended.  Active and passive composite digital flexion provokes pain, even with the wrist stabilized by the examiner.  Palpation of the extensor tendons similarly causes discomfort, with the wrist stabilized.  There is exquisite pain provoked with isolated TMC stress and grind testing.  With the hand and digits fully stabilized, the wrist can be passively  extended and flexed about 30 degrees each fairly comfortably.  Assessment: Right wrist region inflammation that is fairly global, affecting at least the radiocarpal joint, midcarpal joint, first CMC joint, and dorsal extensor tendons.  This favors inflammatory etiology over infectious  Recommendations/Procedure: The exact etiology of this pathology is not certain.  Although some of the clinical evidence favors noninfectious etiology, infection remains still possible.    After obtaining verbal consent, I located the radiocarpal joint by palpation, cleansed the skin with alcohol and infiltrated the subcutaneous tissues with local anesthetic.  I again cleansed the skin with alcohol and inserted a 22-gauge needle into the radiocarpal joint.  Some thin turbid fluid was obtained, about 1.5 mL's.  It did not appear to be frank pus.  It is being sent for stat Gram stain, cell count and crystal analysis, and ultimately joint fluid culture as well.    Gram stain = polys, no organisms Cell count with MSU crystals  Appears to be gout, conferred with EDP, who will begin medical tx for patient and request she f/u with PCP. Dx and plan conveyed by me via phone to patient's daughter.  Rayvon Char [address], [place], [place]  [id number] Office: [phone] Mobile: [phone]  08/05/2018, 5:40 PM

## 2018-08-05 NOTE — ED Notes (Signed)
Pt returned from MRI °

## 2018-08-05 NOTE — ED Notes (Signed)
Patient transported to MRI 

## 2018-08-05 NOTE — ED Triage Notes (Signed)
Pt arrives c/o right hand swelling that has gotten worse since 6/29. Pt also had a fever at home of over 100. Pt was given tylenol PTA.

## 2018-08-06 LAB — GLUCOSE, BODY FLUID OTHER: Glucose, Body Fluid Other: 48 mg/dL

## 2018-08-09 LAB — BODY FLUID CULTURE
Culture: NO GROWTH
Special Requests: NORMAL

## 2018-08-10 LAB — CULTURE, BLOOD (ROUTINE X 2)
Culture: NO GROWTH
Culture: NO GROWTH

## 2018-10-04 ENCOUNTER — Other Ambulatory Visit: Payer: Self-pay

## 2018-10-04 ENCOUNTER — Encounter (HOSPITAL_COMMUNITY): Payer: Self-pay | Admitting: Emergency Medicine

## 2018-10-04 ENCOUNTER — Inpatient Hospital Stay (HOSPITAL_COMMUNITY)
Admission: EM | Admit: 2018-10-04 | Discharge: 2018-10-08 | DRG: 552 | Disposition: A | Discharge status: Skilled Nursing Facility | Payer: Medicare Other | Attending: Internal Medicine | Admitting: Internal Medicine

## 2018-10-04 DIAGNOSIS — Z885 Allergy status to narcotic agent status: Secondary | ICD-10-CM

## 2018-10-04 DIAGNOSIS — Z8249 Family history of ischemic heart disease and other diseases of the circulatory system: Secondary | ICD-10-CM

## 2018-10-04 DIAGNOSIS — Z880 Allergy status to penicillin: Secondary | ICD-10-CM

## 2018-10-04 DIAGNOSIS — Z79890 Hormone replacement therapy (postmenopausal): Secondary | ICD-10-CM

## 2018-10-04 DIAGNOSIS — K219 Gastro-esophageal reflux disease without esophagitis: Secondary | ICD-10-CM | POA: Diagnosis present

## 2018-10-04 DIAGNOSIS — M461 Sacroiliitis, not elsewhere classified: Secondary | ICD-10-CM | POA: Diagnosis present

## 2018-10-04 DIAGNOSIS — Z794 Long term (current) use of insulin: Secondary | ICD-10-CM

## 2018-10-04 DIAGNOSIS — Z833 Family history of diabetes mellitus: Secondary | ICD-10-CM

## 2018-10-04 DIAGNOSIS — Z808 Family history of malignant neoplasm of other organs or systems: Secondary | ICD-10-CM

## 2018-10-04 DIAGNOSIS — R0902 Hypoxemia: Secondary | ICD-10-CM | POA: Diagnosis present

## 2018-10-04 DIAGNOSIS — M5116 Intervertebral disc disorders with radiculopathy, lumbar region: Secondary | ICD-10-CM | POA: Diagnosis not present

## 2018-10-04 DIAGNOSIS — Z6825 Body mass index (BMI) 25.0-25.9, adult: Secondary | ICD-10-CM

## 2018-10-04 DIAGNOSIS — J449 Chronic obstructive pulmonary disease, unspecified: Secondary | ICD-10-CM | POA: Diagnosis present

## 2018-10-04 DIAGNOSIS — Z79899 Other long term (current) drug therapy: Secondary | ICD-10-CM

## 2018-10-04 DIAGNOSIS — M1990 Unspecified osteoarthritis, unspecified site: Secondary | ICD-10-CM | POA: Diagnosis present

## 2018-10-04 DIAGNOSIS — M549 Dorsalgia, unspecified: Secondary | ICD-10-CM | POA: Diagnosis present

## 2018-10-04 DIAGNOSIS — J8410 Pulmonary fibrosis, unspecified: Secondary | ICD-10-CM | POA: Diagnosis present

## 2018-10-04 DIAGNOSIS — E669 Obesity, unspecified: Secondary | ICD-10-CM | POA: Diagnosis present

## 2018-10-04 DIAGNOSIS — M545 Low back pain, unspecified: Secondary | ICD-10-CM | POA: Diagnosis present

## 2018-10-04 DIAGNOSIS — Z79891 Long term (current) use of opiate analgesic: Secondary | ICD-10-CM

## 2018-10-04 DIAGNOSIS — E1122 Type 2 diabetes mellitus with diabetic chronic kidney disease: Secondary | ICD-10-CM | POA: Diagnosis present

## 2018-10-04 DIAGNOSIS — N183 Chronic kidney disease, stage 3 unspecified: Secondary | ICD-10-CM | POA: Diagnosis present

## 2018-10-04 DIAGNOSIS — Z8051 Family history of malignant neoplasm of kidney: Secondary | ICD-10-CM

## 2018-10-04 DIAGNOSIS — F32A Depression, unspecified: Secondary | ICD-10-CM | POA: Diagnosis present

## 2018-10-04 DIAGNOSIS — M542 Cervicalgia: Secondary | ICD-10-CM | POA: Diagnosis present

## 2018-10-04 DIAGNOSIS — Z96653 Presence of artificial knee joint, bilateral: Secondary | ICD-10-CM | POA: Diagnosis present

## 2018-10-04 DIAGNOSIS — F329 Major depressive disorder, single episode, unspecified: Secondary | ICD-10-CM | POA: Diagnosis present

## 2018-10-04 DIAGNOSIS — Z20828 Contact with and (suspected) exposure to other viral communicable diseases: Secondary | ICD-10-CM | POA: Diagnosis present

## 2018-10-04 DIAGNOSIS — G8929 Other chronic pain: Secondary | ICD-10-CM | POA: Diagnosis present

## 2018-10-04 DIAGNOSIS — M5416 Radiculopathy, lumbar region: Secondary | ICD-10-CM

## 2018-10-04 DIAGNOSIS — K760 Fatty (change of) liver, not elsewhere classified: Secondary | ICD-10-CM | POA: Diagnosis present

## 2018-10-04 DIAGNOSIS — H9190 Unspecified hearing loss, unspecified ear: Secondary | ICD-10-CM | POA: Diagnosis present

## 2018-10-04 DIAGNOSIS — E039 Hypothyroidism, unspecified: Secondary | ICD-10-CM | POA: Diagnosis present

## 2018-10-04 DIAGNOSIS — E1169 Type 2 diabetes mellitus with other specified complication: Secondary | ICD-10-CM | POA: Diagnosis present

## 2018-10-04 LAB — CBC
HCT: 41.3 % (ref 36.0–46.0)
Hemoglobin: 13.6 g/dL (ref 12.0–15.0)
MCH: 33 pg (ref 26.0–34.0)
MCHC: 32.9 g/dL (ref 30.0–36.0)
MCV: 100.2 fL — ABNORMAL HIGH (ref 80.0–100.0)
Platelets: 235 10*3/uL (ref 150–400)
RBC: 4.12 MIL/uL (ref 3.87–5.11)
RDW: 13.8 % (ref 11.5–15.5)
WBC: 12.7 10*3/uL — ABNORMAL HIGH (ref 4.0–10.5)
nRBC: 0 % (ref 0.0–0.2)

## 2018-10-04 MED ORDER — ONDANSETRON HCL 4 MG/2ML IJ SOLN
4.0000 mg | Freq: Once | INTRAMUSCULAR | Status: AC
  Administered 2018-10-04: 4 mg via INTRAVENOUS
  Filled 2018-10-04: qty 2

## 2018-10-04 MED ORDER — MORPHINE SULFATE (PF) 2 MG/ML IV SOLN
2.0000 mg | Freq: Once | INTRAVENOUS | Status: AC
  Administered 2018-10-04: 2 mg via INTRAVENOUS
  Filled 2018-10-04: qty 1

## 2018-10-04 NOTE — ED Triage Notes (Signed)
Patient here from home via EMS with complaints of right hip pain radiating into groin. Hx of same. Denies injury. Tylenol with no relief.

## 2018-10-04 NOTE — ED Provider Notes (Signed)
[facility] DEPT Provider Note   CSN: [id number] Arrival date & time: 10/04/18  2256     History   Chief Complaint Chief Complaint  Patient presents with  . Hip Pain    HPI Shelia Black is a 85 y.o. female.     Patient brought to the emergency department by ambulance from home for evaluation of low back pain radiating to the right hip.  Patient reports that she has a history of bulging disks with recurrent pain similar to this in the past.  She states "I guess it just flared up".  There has not been any fall or injury.     Past Medical History:  Diagnosis Date  . Arthritis   . Cataract   . Chronic cough   . COPD (chronic obstructive pulmonary disease) ([place])   . Diabetes mellitus without complication ([place])   . Diverticulitis   . Diverticulosis   . GERD (gastroesophageal reflux disease)   . Hepatic steatosis   . Hiatal hernia   . Hypothyroidism   . ILD (interstitial lung disease) ([place])   . Pulmonary fibrosis ([place])   . Thyroid disease   . Vertigo     Patient Active Problem List   Diagnosis Date Noted  . Hypoxia 10/05/2018  . Back pain, lumbosacral 10/05/2018  . Ischemic colitis ([place]) 03/14/2018  . Colitis 03/13/2018  . Colitis with rectal bleeding 03/12/2018  . Sepsis ([place]) 05/23/2016  . Diverticulitis 05/23/2016  . GERD (gastroesophageal reflux disease) 05/23/2016  . Hypothyroidism 05/23/2016  . Depression 05/23/2016  . ILD (interstitial lung disease) ([place])   . Pulmonary fibrosis ([place])   . Acute UTI   . UTI (lower urinary tract infection)   . Hyperglycemia 02/20/2015  . Diabetes mellitus type 2 in obese ([place]) 02/20/2015  . CKD (chronic kidney disease) stage 3, GFR 30-59 ml/min (H[facility]) 02/20/2015  . UTI (urinary tract infection) 04/29/2014  . Diverticulitis of colon with bleeding 04/29/2014  . Acute respiratory failure with hypoxia ([place]) 04/29/2014  . Acute renal failure ([place]) 04/29/2014  . Hyperkalemia 04/29/2014  .  Nausea and vomiting 04/29/2014    Past Surgical History:  Procedure Laterality Date  . APPENDECTOMY    . INTRAOCULAR LENS INSERTION  1997  . REPLACEMENT TOTAL KNEE BILATERAL    . SHOULDER SURGERY Right    tendonitis     OB History   No obstetric history on file.      Home Medications    Prior to Admission medications   Medication Sig Start Date End Date Taking? Authorizing Provider  acetaminophen (TYLENOL) 500 MG tablet Take 500-1,000 mg by mouth every 6 (six) hours as needed for moderate pain or fever.    Yes [provider]  albuterol (PROVENTIL HFA;VENTOLIN HFA) 108 (90 Base) MCG/ACT inhaler Inhale 1 puff into the lungs every 6 (six) hours as needed for wheezing or shortness of breath. 05/27/16  Yes Patrecia Pour, Christean Grief, MD  ammonium lactate (LAC-HYDRIN) 12 % lotion Apply 1 application topically daily as needed for dry skin.    Yes [provider]  Ascorbic Acid (VITAMIN C) 1000 MG tablet Take 1,000 mg by mouth daily.   Yes [provider]  Calcium Carbonate (CALCIUM 600 PO) Take 1 tablet by mouth 2 (two) times daily.   Yes [provider]  chlorpheniramine-HYDROcodone (TUSSIONEX) 10-8 MG/5ML SUER Take 5 mLs by mouth every 12 (twelve) hours as needed for cough.  01/14/18  Yes [provider]  Cholecalciferol (VITAMIN D-3) 5000 units  TABS Take 1 tablet by mouth daily.   Yes [provider]  diphenoxylate-atropine (LOMOTIL) 2.5-0.025 MG tablet Take 2 tablets by mouth 4 (four) times daily as needed for diarrhea or loose stools. diarrhea 11/11/12  Yes [provider]  levothyroxine (SYNTHROID, LEVOTHROID) 125 MCG tablet Take 125 mcg by mouth daily before breakfast.   Yes [provider]  Olopatadine HCl 0.2 % SOLN Apply 1 drop to eye daily as needed (allergies).   Yes [provider]  omeprazole (PRILOSEC) 40 MG capsule Take 1 capsule (40 mg total) by mouth daily. 03/15/18  Yes Eugenie Filler, MD   Polyethyl Glycol-Propyl Glycol (SYSTANE) 0.4-0.3 % SOLN Apply 1 drop to eye daily as needed (dry eyes).   Yes [provider]  Probiotic Product (ALIGN PO) Take 1 tablet by mouth daily.   Yes [provider]  HYDROcodone-acetaminophen (NORCO/VICODIN) 5-325 MG tablet Take 1-2 tablets by mouth every 6 (six) hours as needed. Patient not taking: Reported on 10/05/2018 03/31/18   Montine Circle, PA-C  HYDROcodone-acetaminophen (NORCO/VICODIN) 5-325 MG tablet Take 1 tablet by mouth every 6 (six) hours as needed for moderate pain. Patient not taking: Reported on 10/05/2018 08/03/18   Fredia Sorrow, MD  ondansetron (ZOFRAN ODT) 4 MG disintegrating tablet Take 1 tablet (4 mg total) by mouth every 8 (eight) hours as needed for nausea or vomiting. Patient not taking: Reported on 10/05/2018 06/09/18   Varney Biles, MD  polyethylene glycol (MIRALAX / GLYCOLAX) 17 g packet Take 17 g by mouth daily. Patient not taking: Reported on 08/03/2018 06/09/18   Varney Biles, MD    Family History Family History  Problem Relation Age of Onset  . Diabetes Mother   . Kidney cancer Mother   . Hypertension Father   . Supraventricular tachycardia Father   . Skin cancer Father   . Diabetes Sister     Social History Social History   Tobacco Use  . Smoking status: Never Smoker  . Smokeless tobacco: Never Used  Substance Use Topics  . Alcohol use: No  . Drug use: No     Allergies   Oxycodone-acetaminophen, Penicillins, Codeine, Percocet [oxycodone-acetaminophen], and Tramadol   Review of Systems Review of Systems  Musculoskeletal: Positive for back pain.  All other systems reviewed and are negative.    Physical Exam Updated Vital Signs BP (!) 127/97 (BP Location: Left Arm)   Pulse 62   Temp 98 F (36.7 C) (Oral)   Resp 18   Ht 5' 4"  (1.626 m)   Wt 66.7 kg   LMP  (LMP Unknown)   SpO2 100%   BMI 25.24 kg/m   Physical Exam Vitals signs and nursing note reviewed.   Constitutional:      General: She is not in acute distress.    Appearance: Normal appearance. She is well-developed.  HENT:     Head: Normocephalic and atraumatic.     Right Ear: Hearing normal.     Left Ear: Hearing normal.     Nose: Nose normal.  Eyes:     Conjunctiva/sclera: Conjunctivae normal.     Pupils: Pupils are equal, round, and reactive to light.  Neck:     Musculoskeletal: Normal range of motion and neck supple.  Cardiovascular:     Rate and Rhythm: Regular rhythm.     Heart sounds: S1 normal and S2 normal. No murmur. No friction rub. No gallop.   Pulmonary:     Effort: Pulmonary effort is normal. No respiratory distress.  Breath sounds: Normal breath sounds.  Chest:     Chest wall: No tenderness.  Abdominal:     General: Bowel sounds are normal.     Palpations: Abdomen is soft.     Tenderness: There is no abdominal tenderness. There is no guarding or rebound. Negative signs include Murphy's sign and McBurney's sign.     Hernia: No hernia is present.  Musculoskeletal: Normal range of motion.     Lumbar back: She exhibits tenderness.       Back:  Skin:    General: Skin is warm and dry.     Findings: No rash.  Neurological:     Mental Status: She is alert and oriented to person, place, and time.     GCS: GCS eye subscore is 4. GCS verbal subscore is 5. GCS motor subscore is 6.     Cranial Nerves: No cranial nerve deficit.     Sensory: No sensory deficit.     Coordination: Coordination normal.  Psychiatric:        Speech: Speech normal.        Behavior: Behavior normal.        Thought Content: Thought content normal.      ED Treatments / Results  Labs (all labs ordered are listed, but only abnormal results are displayed) Labs Reviewed  CBC - Abnormal; Notable for the following components:      Result Value   WBC 12.7 (*)    MCV 100.2 (*)    All other components within normal limits  BASIC METABOLIC PANEL - Abnormal; Notable for the following  components:   Glucose, Bld 173 (*)    GFR calc non Af Amer 50 (*)    GFR calc Af Amer 58 (*)    All other components within normal limits  SARS CORONAVIRUS 2 (TAT 6-24 HRS)  URINALYSIS, ROUTINE W REFLEX MICROSCOPIC  CBC  COMPREHENSIVE METABOLIC PANEL    EKG None  Radiology Dg Lumbar Spine Complete  Result Date: 10/05/2018 CLINICAL DATA:  Lumbosacral back pain. EXAM: LUMBAR SPINE - COMPLETE 4+ VIEW COMPARISON:  Lumbar spine CT 03/31/2018 FINDINGS: The alignment is maintained, similar straightening to prior exam. Vertebral body heights are normal. There is no listhesis. Disc space narrowing and endplate spurring at C1-Y6, L5-S1, and L2-L3. Multilevel facet hypertrophy. The posterior elements are intact. No fracture. Sacroiliac joints are symmetric and normal. Aortic atherosclerosis. IMPRESSION: Multilevel degenerative disc disease and facet arthropathy throughout the lumbar spine. No acute osseous abnormality. Electronically Signed   By: Keith Rake M.D.   On: 10/05/2018 02:36   Dg Hip Unilat W Or Wo Pelvis 2-3 Views Right  Result Date: 10/05/2018 CLINICAL DATA:  Right hip pain. EXAM: DG HIP (WITH OR WITHOUT PELVIS) 2-3V RIGHT COMPARISON:  None. FINDINGS: The cortical margins of the bony pelvis and right hip are intact. No fracture. Pubic symphysis and sacroiliac joints are congruent. Both femoral heads are well-seated in the respective acetabula. Age related acetabular spurring bilaterally. IMPRESSION: Mild degenerative change for age.  No acute osseous abnormality. Electronically Signed   By: Keith Rake M.D.   On: 10/05/2018 02:38    Procedures Procedures (including critical care time)  Medications Ordered in ED Medications  enoxaparin (LOVENOX) injection 40 mg (has no administration in time range)  0.45 % sodium chloride infusion (has no administration in time range)  HYDROcodone-acetaminophen (NORCO/VICODIN) 5-325 MG per tablet 1-2 tablet (has no administration in time  range)  ondansetron (ZOFRAN) tablet 4 mg (has no administration  in time range)    Or  ondansetron (ZOFRAN) injection 4 mg (has no administration in time range)  morphine 2 MG/ML injection 2 mg (has no administration in time range)  morphine 2 MG/ML injection 2 mg (2 mg Intravenous Given 10/04/18 2340)  ondansetron (ZOFRAN) injection 4 mg (4 mg Intravenous Given 10/04/18 2340)  morphine 2 MG/ML injection 2 mg (2 mg Intravenous Given 10/05/18 0028)     Initial Impression / Assessment and Plan / ED Course  I have reviewed the triage vital signs and the nursing notes.  Pertinent labs & imaging results that were available during my care of the patient were reviewed by me and considered in my medical decision making (see chart for details).        Patient presents to the ER for evaluation of low back pain radiating to the right hip.  Patient has a history of chronic low back pain, sees orthopedics for this and receives injections.  She has not had any recent injury.  X-ray shows degenerative changes, no acute findings.  Patient given multiple doses of pain medication but is still having severe pain, cannot move her back or leg.  She will require hospitalization for further management of her pain.  Final Clinical Impressions(s) / ED Diagnoses   Final diagnoses:  Lumbar radiculopathy    ED Discharge Orders    None       Orpah Greek, MD 10/05/18 [phone]

## 2018-10-05 ENCOUNTER — Emergency Department (HOSPITAL_COMMUNITY): Payer: Medicare Other

## 2018-10-05 ENCOUNTER — Inpatient Hospital Stay (HOSPITAL_COMMUNITY): Payer: Medicare Other

## 2018-10-05 DIAGNOSIS — Z79891 Long term (current) use of opiate analgesic: Secondary | ICD-10-CM | POA: Diagnosis not present

## 2018-10-05 DIAGNOSIS — J449 Chronic obstructive pulmonary disease, unspecified: Secondary | ICD-10-CM | POA: Diagnosis present

## 2018-10-05 DIAGNOSIS — M545 Low back pain, unspecified: Secondary | ICD-10-CM | POA: Diagnosis present

## 2018-10-05 DIAGNOSIS — K219 Gastro-esophageal reflux disease without esophagitis: Secondary | ICD-10-CM | POA: Diagnosis present

## 2018-10-05 DIAGNOSIS — E039 Hypothyroidism, unspecified: Secondary | ICD-10-CM | POA: Diagnosis present

## 2018-10-05 DIAGNOSIS — Z885 Allergy status to narcotic agent status: Secondary | ICD-10-CM | POA: Diagnosis not present

## 2018-10-05 DIAGNOSIS — F329 Major depressive disorder, single episode, unspecified: Secondary | ICD-10-CM | POA: Diagnosis present

## 2018-10-05 DIAGNOSIS — Z96653 Presence of artificial knee joint, bilateral: Secondary | ICD-10-CM | POA: Diagnosis present

## 2018-10-05 DIAGNOSIS — M1990 Unspecified osteoarthritis, unspecified site: Secondary | ICD-10-CM | POA: Diagnosis present

## 2018-10-05 DIAGNOSIS — E1122 Type 2 diabetes mellitus with diabetic chronic kidney disease: Secondary | ICD-10-CM | POA: Diagnosis present

## 2018-10-05 DIAGNOSIS — Z79899 Other long term (current) drug therapy: Secondary | ICD-10-CM | POA: Diagnosis not present

## 2018-10-05 DIAGNOSIS — R0902 Hypoxemia: Secondary | ICD-10-CM | POA: Diagnosis present

## 2018-10-05 DIAGNOSIS — M5116 Intervertebral disc disorders with radiculopathy, lumbar region: Secondary | ICD-10-CM | POA: Diagnosis present

## 2018-10-05 DIAGNOSIS — G8929 Other chronic pain: Secondary | ICD-10-CM | POA: Diagnosis present

## 2018-10-05 DIAGNOSIS — M461 Sacroiliitis, not elsewhere classified: Secondary | ICD-10-CM | POA: Diagnosis present

## 2018-10-05 DIAGNOSIS — J8410 Pulmonary fibrosis, unspecified: Secondary | ICD-10-CM | POA: Diagnosis present

## 2018-10-05 DIAGNOSIS — N183 Chronic kidney disease, stage 3 (moderate): Secondary | ICD-10-CM | POA: Diagnosis present

## 2018-10-05 DIAGNOSIS — Z20828 Contact with and (suspected) exposure to other viral communicable diseases: Secondary | ICD-10-CM | POA: Diagnosis present

## 2018-10-05 DIAGNOSIS — M549 Dorsalgia, unspecified: Secondary | ICD-10-CM | POA: Diagnosis present

## 2018-10-05 DIAGNOSIS — Z808 Family history of malignant neoplasm of other organs or systems: Secondary | ICD-10-CM | POA: Diagnosis not present

## 2018-10-05 DIAGNOSIS — Z8051 Family history of malignant neoplasm of kidney: Secondary | ICD-10-CM | POA: Diagnosis not present

## 2018-10-05 DIAGNOSIS — E1169 Type 2 diabetes mellitus with other specified complication: Secondary | ICD-10-CM | POA: Diagnosis not present

## 2018-10-05 DIAGNOSIS — Z8249 Family history of ischemic heart disease and other diseases of the circulatory system: Secondary | ICD-10-CM | POA: Diagnosis not present

## 2018-10-05 DIAGNOSIS — Z833 Family history of diabetes mellitus: Secondary | ICD-10-CM | POA: Diagnosis not present

## 2018-10-05 DIAGNOSIS — Z794 Long term (current) use of insulin: Secondary | ICD-10-CM | POA: Diagnosis not present

## 2018-10-05 DIAGNOSIS — Z79890 Hormone replacement therapy (postmenopausal): Secondary | ICD-10-CM | POA: Diagnosis not present

## 2018-10-05 DIAGNOSIS — Z880 Allergy status to penicillin: Secondary | ICD-10-CM | POA: Diagnosis not present

## 2018-10-05 LAB — BASIC METABOLIC PANEL
Anion gap: 10 (ref 5–15)
BUN: 21 mg/dL (ref 8–23)
CO2: 23 mmol/L (ref 22–32)
Calcium: 9.1 mg/dL (ref 8.9–10.3)
Chloride: 102 mmol/L (ref 98–111)
Creatinine, Ser: 0.97 mg/dL (ref 0.44–1.00)
GFR calc Af Amer: 58 mL/min — ABNORMAL LOW (ref >60)
GFR calc non Af Amer: 50 mL/min — ABNORMAL LOW (ref >60)
Glucose, Bld: 173 mg/dL — ABNORMAL HIGH (ref 70–99)
Potassium: 4.4 mmol/L (ref 3.5–5.1)
Sodium: 135 mmol/L (ref 135–145)

## 2018-10-05 LAB — SARS CORONAVIRUS 2 BY RT PCR (HOSPITAL ORDER, PERFORMED IN ~~LOC~~ HOSPITAL LAB): SARS Coronavirus 2: NEGATIVE

## 2018-10-05 MED ORDER — GADOBUTROL 1 MMOL/ML IV SOLN
7.0000 mL | Freq: Once | INTRAVENOUS | Status: AC | PRN
  Administered 2018-10-05: 7 mL via INTRAVENOUS

## 2018-10-05 MED ORDER — PROMETHAZINE HCL 25 MG/ML IJ SOLN
12.5000 mg | Freq: Four times a day (QID) | INTRAMUSCULAR | Status: DC | PRN
  Administered 2018-10-05: 12.5 mg via INTRAVENOUS
  Filled 2018-10-05: qty 1

## 2018-10-05 MED ORDER — LEVOTHYROXINE SODIUM 25 MCG PO TABS
125.0000 ug | ORAL_TABLET | Freq: Every day | ORAL | Status: DC
  Administered 2018-10-06 – 2018-10-08 (×3): 125 ug via ORAL
  Filled 2018-10-05 (×3): qty 1

## 2018-10-05 MED ORDER — ENOXAPARIN SODIUM 40 MG/0.4ML ~~LOC~~ SOLN
40.0000 mg | SUBCUTANEOUS | Status: DC
  Administered 2018-10-05 – 2018-10-07 (×3): 40 mg via SUBCUTANEOUS
  Filled 2018-10-05 (×3): qty 0.4

## 2018-10-05 MED ORDER — PANTOPRAZOLE SODIUM 40 MG PO TBEC
80.0000 mg | DELAYED_RELEASE_TABLET | Freq: Every day | ORAL | Status: DC
  Administered 2018-10-06 – 2018-10-08 (×3): 80 mg via ORAL
  Filled 2018-10-05 (×3): qty 2

## 2018-10-05 MED ORDER — SODIUM CHLORIDE 0.45 % IV SOLN
INTRAVENOUS | Status: DC
  Administered 2018-10-05: 12:00:00 via INTRAVENOUS

## 2018-10-05 MED ORDER — LORAZEPAM 2 MG/ML IJ SOLN: 0.5000 mg | Freq: Once | INTRAMUSCULAR | Status: DC | PRN

## 2018-10-05 MED ORDER — ONDANSETRON HCL 4 MG PO TABS
4.0000 mg | ORAL_TABLET | Freq: Four times a day (QID) | ORAL | Status: DC | PRN
  Administered 2018-10-06: 4 mg via ORAL
  Filled 2018-10-05: qty 1

## 2018-10-05 MED ORDER — MORPHINE SULFATE (PF) 2 MG/ML IV SOLN
2.0000 mg | INTRAVENOUS | Status: DC | PRN
  Administered 2018-10-05: 2 mg via INTRAVENOUS
  Filled 2018-10-05: qty 1

## 2018-10-05 MED ORDER — ONDANSETRON HCL 4 MG/2ML IJ SOLN: 4.0000 mg | Freq: Four times a day (QID) | INTRAMUSCULAR | Status: DC | PRN

## 2018-10-05 MED ORDER — MORPHINE SULFATE (PF) 2 MG/ML IV SOLN
2.0000 mg | Freq: Once | INTRAVENOUS | Status: AC
  Administered 2018-10-05: 2 mg via INTRAVENOUS
  Filled 2018-10-05: qty 1

## 2018-10-05 MED ORDER — HYDROCODONE-ACETAMINOPHEN 5-325 MG PO TABS
1.0000 | ORAL_TABLET | ORAL | Status: DC | PRN
  Administered 2018-10-05 – 2018-10-06 (×3): 1 via ORAL
  Administered 2018-10-07: 2 via ORAL
  Administered 2018-10-07 (×2): 1 via ORAL
  Filled 2018-10-05 (×5): qty 1
  Filled 2018-10-05: qty 2

## 2018-10-05 NOTE — H&P (Signed)
History and Physical   Shelia Black ERD:[id number] DOB: 12/15/1921 DOA: 10/04/2018  Referring MD/NP/PA: Dr. Sandrea Matte  PCP: [name], PA-C   Outpatient Specialists: None  Patient coming from: Home  Chief Complaint: Low back pain  HPI: Shelia Black is a 85 y.o. female with medical history significant of degenerative disc disease, COPD, GERD, hypothyroidism, pulmonary fibrosis, diverticular disease and diabetes who has had chronic low back pain has had multiple injections in the past.  She is also had spinal stenosis for which she has been seen by orthopedics.  Patient came in with persistent low back pain which is getting worse with any activities.  From degenerative disc disease of the lumbar spine.  Patient has received multiple medications for pain in the ER but pain is persistent.  She is unable to stand on her feet.  Patient is therefore being admitted to the hospital for pain management.  She denied any fall or any new injury..  ED Course: Temperature 98 for blood pressure 137/50 pulse 68 respiratory 22 oxygen sat 80% on room air.  White count is 12.7 hemoglobin 13.6 and the rest of chemistry and CBC is within normal glucose 173.  X-ray of the lumbar spine showed multiple degenerative disc disease.  No acute fracture  Review of Systems: As per HPI otherwise 10 point review of systems negative.    Past Medical History:  Diagnosis Date   Arthritis    Cataract    Chronic cough    COPD (chronic obstructive pulmonary disease) (HCC)    Diabetes mellitus without complication (HCC)    Diverticulitis    Diverticulosis    GERD (gastroesophageal reflux disease)    Hepatic steatosis    Hiatal hernia    Hypothyroidism    ILD (interstitial lung disease) ([place])    Pulmonary fibrosis ([place])    Thyroid disease    Vertigo     Past Surgical History:  Procedure Laterality Date   APPENDECTOMY     INTRAOCULAR LENS INSERTION  1997   REPLACEMENT TOTAL KNEE  BILATERAL     SHOULDER SURGERY Right    tendonitis     reports that she has never smoked. She has never used smokeless tobacco. She reports that she does not drink alcohol or use drugs.  Allergies  Allergen Reactions   Oxycodone-Acetaminophen Nausea Only   Penicillins Hives and Rash    Has patient had a PCN reaction causing immediate rash, facial/tongue/throat swelling, SOB or lightheadedness with hypotension: no Has patient had a PCN reaction causing severe rash involving mucus membranes or skin necrosis: unknown Has patient had a PCN reaction that required hospitalization : unknown Has patient had a PCN reaction occurring within the last 10 years: no If all of the above answers are "NO", then may proceed with Cephalosporin use.    Codeine     Nausea and vomiting   Percocet [Oxycodone-Acetaminophen] Nausea And Vomiting   Tramadol Nausea And Vomiting    Family History  Problem Relation Age of Onset   Diabetes Mother    Kidney cancer Mother    Hypertension Father    Supraventricular tachycardia Father    Skin cancer Father    Diabetes Sister      Prior to Admission medications   Medication Sig Start Date End Date Taking? Authorizing Provider  acetaminophen (TYLENOL) 500 MG tablet Take 500-1,000 mg by mouth every 6 (six) hours as needed for moderate pain or fever.     [provider]  albuterol (PROVENTIL HFA;VENTOLIN HFA) 108 (  90 Base) MCG/ACT inhaler Inhale 1 puff into the lungs every 6 (six) hours as needed for wheezing or shortness of breath. 05/27/16   Doreatha Lew, MD  ammonium lactate (LAC-HYDRIN) 12 % lotion Apply 1 application topically daily as needed for dry skin.     [provider]  ascorbic acid (VITAMIN C) 250 MG CHEW Chew 250 mg by mouth daily.    [provider]  Calcium Carbonate (CALCIUM 600 PO) Take 1 tablet by mouth 2 (two) times daily.    [provider]  chlorpheniramine-HYDROcodone (TUSSIONEX) 10-8  MG/5ML SUER Take 5 mLs by mouth every 12 (twelve) hours as needed for cough.  01/14/18   [provider]  Cholecalciferol (VITAMIN D-3) 5000 units TABS Take 1 tablet by mouth daily.    [provider]  diclofenac sodium (VOLTAREN) 1 % GEL Apply 2 g topically 4 (four) times daily as needed (pain).    [provider]  diphenoxylate-atropine (LOMOTIL) 2.5-0.025 MG tablet Take 2 tablets by mouth 4 (four) times daily as needed for diarrhea or loose stools. diarrhea 11/11/12   [provider]  HYDROcodone-acetaminophen (NORCO/VICODIN) 5-325 MG tablet Take 1-2 tablets by mouth every 6 (six) hours as needed. Patient not taking: Reported on 08/03/2018 03/31/18   Montine Circle, PA-C  HYDROcodone-acetaminophen (NORCO/VICODIN) 5-325 MG tablet Take 1 tablet by mouth every 6 (six) hours as needed for moderate pain. 08/03/18   Fredia Sorrow, MD  levothyroxine (SYNTHROID, LEVOTHROID) 125 MCG tablet Take 125 mcg by mouth daily before breakfast.    [provider]  Olopatadine HCl 0.2 % SOLN Apply 1 drop to eye daily as needed (allergies).    [provider]  omeprazole (PRILOSEC) 40 MG capsule Take 1 capsule (40 mg total) by mouth daily. 03/15/18   Eugenie Filler, MD  ondansetron (ZOFRAN ODT) 4 MG disintegrating tablet Take 1 tablet (4 mg total) by mouth every 8 (eight) hours as needed for nausea or vomiting. 06/09/18   Varney Biles, MD  Polyethyl Glycol-Propyl Glycol (SYSTANE) 0.4-0.3 % SOLN Apply 1 drop to eye daily as needed (dry eyes).    [provider]  polyethylene glycol (MIRALAX / GLYCOLAX) 17 g packet Take 17 g by mouth daily. Patient not taking: Reported on 08/03/2018 06/09/18   Varney Biles, MD  Probiotic Product (ALIGN PO) Take 1 tablet by mouth daily.    [provider]    Physical Exam: Vitals:   10/05/18 0045 10/05/18 0100 10/05/18 0200 10/05/18 0300  BP:  (!) 128/58 (!) 136/56 (!) 137/50  Pulse: 61 67 66 64  Resp: (!)  22 18 18  (!) 22  Temp:      TempSrc:      SpO2: 92% (!) 86% (!) 80% 94%  Weight:      Height:          Constitutional: NAD, calm, comfortable Vitals:   10/05/18 0045 10/05/18 0100 10/05/18 0200 10/05/18 0300  BP:  (!) 128/58 (!) 136/56 (!) 137/50  Pulse: 61 67 66 64  Resp: (!) 22 18 18  (!) 22  Temp:      TempSrc:      SpO2: 92% (!) 86% (!) 80% 94%  Weight:      Height:       Eyes: PERRL, lids and conjunctivae normal ENMT: Mucous membranes are moist. Posterior pharynx clear of any exudate or lesions.Normal dentition.  Neck: normal, supple, no masses, no thyromegaly Respiratory: Coarse breath sounds, bilateral rhonchi and crackles no wheezes. Normal  respiratory effort. No accessory muscle use.  Cardiovascular: Regular rate and rhythm, no murmurs / rubs / gallops. No extremity edema. 2+ pedal pulses. No carotid bruits.  Abdomen: no tenderness, no masses palpated. No hepatosplenomegaly. Bowel sounds positive.  Musculoskeletal: no clubbing / cyanosis. No joint deformity upper and lower extremities. Good ROM, no contractures. Normal muscle tone.  Skin: no rashes, lesions, ulcers. No induration Neurologic: CN 2-12 grossly intact. Sensation intact, DTR normal. Strength 5/5 in all 4.  Positive SLR bilaterally Psychiatric: Normal judgment and insight. Alert and oriented x 3. Normal mood.     Labs on Admission: I have personally reviewed following labs and imaging studies  CBC: Recent Labs  Lab 10/04/18 2333  WBC 12.7*  HGB 13.6  HCT 41.3  MCV 100.2*  PLT 342   Basic Metabolic Panel: Recent Labs  Lab 10/04/18 2333  NA 135  K 4.4  CL 102  CO2 23  GLUCOSE 173*  BUN 21  CREATININE 0.97  CALCIUM 9.1   GFR: Estimated Creatinine Clearance: 33.1 mL/min (by C-G formula based on SCr of 0.97 mg/dL). Liver Function Tests: No results for input(s): AST, ALT, ALKPHOS, BILITOT, PROT, ALBUMIN in the last 168 hours. No results for input(s): LIPASE, AMYLASE in the last 168  hours. No results for input(s): AMMONIA in the last 168 hours. Coagulation Profile: No results for input(s): INR, PROTIME in the last 168 hours. Cardiac Enzymes: No results for input(s): CKTOTAL, CKMB, CKMBINDEX, TROPONINI in the last 168 hours. BNP (last 3 results) No results for input(s): PROBNP in the last 8760 hours. HbA1C: No results for input(s): HGBA1C in the last 72 hours. CBG: No results for input(s): GLUCAP in the last 168 hours. Lipid Profile: No results for input(s): CHOL, HDL, LDLCALC, TRIG, CHOLHDL, LDLDIRECT in the last 72 hours. Thyroid Function Tests: No results for input(s): TSH, T4TOTAL, FREET4, T3FREE, THYROIDAB in the last 72 hours. Anemia Panel: No results for input(s): VITAMINB12, FOLATE, FERRITIN, TIBC, IRON, RETICCTPCT in the last 72 hours. Urine analysis:    Component Value Date/Time   COLORURINE YELLOW 06/09/2018 2008   APPEARANCEUR CLEAR 06/09/2018 2008   LABSPEC 1.012 06/09/2018 2008   PHURINE 6.0 06/09/2018 2008   [place] 06/09/2018 2008   HGBUR NEGATIVE 06/09/2018 2008   BILIRUBINUR NEGATIVE 06/09/2018 2008   KETONESUR 5 (A) 06/09/2018 2008   PROTEINUR NEGATIVE 06/09/2018 2008   UROBILINOGEN 0.2 04/29/2014 0856   NITRITE NEGATIVE 06/09/2018 2008   LEUKOCYTESUR NEGATIVE 06/09/2018 2008   Sepsis Labs: @LABRCNTIP (procalcitonin:4,lacticidven:4) )No results found for this or any previous visit (from the past 240 hour(s)).   Radiological Exams on Admission: Dg Lumbar Spine Complete  Result Date: 10/05/2018 CLINICAL DATA:  Lumbosacral back pain. EXAM: LUMBAR SPINE - COMPLETE 4+ VIEW COMPARISON:  Lumbar spine CT 03/31/2018 FINDINGS: The alignment is maintained, similar straightening to prior exam. Vertebral body heights are normal. There is no listhesis. Disc space narrowing and endplate spurring at A7-G8, L5-S1, and L2-L3. Multilevel facet hypertrophy. The posterior elements are intact. No fracture. Sacroiliac joints are symmetric and normal.  Aortic atherosclerosis. IMPRESSION: Multilevel degenerative disc disease and facet arthropathy throughout the lumbar spine. No acute osseous abnormality. Electronically Signed   By: Keith Rake M.D.   On: 10/05/2018 02:36   Dg Hip Unilat W Or Wo Pelvis 2-3 Views Right  Result Date: 10/05/2018 CLINICAL DATA:  Right hip pain. EXAM: DG HIP (WITH OR WITHOUT PELVIS) 2-3V RIGHT COMPARISON:  None. FINDINGS: The cortical margins of the bony pelvis and right hip  are intact. No fracture. Pubic symphysis and sacroiliac joints are congruent. Both femoral heads are well-seated in the respective acetabula. Age related acetabular spurring bilaterally. IMPRESSION: Mild degenerative change for age.  No acute osseous abnormality. Electronically Signed   By: Keith Rake M.D.   On: 10/05/2018 02:38   Assessment/Plan Principal Problem:   Back pain, lumbosacral Active Problems:   Diabetes mellitus type 2 in obese (HCC)   CKD (chronic kidney disease) stage 3, GFR 30-59 ml/min ([facility])   GERD (gastroesophageal reflux disease)   Hypothyroidism   Depression   Hypoxia     #1 low back pain: Secondary to degenerative disc disease.  Patient will be admitted for pain control.  She is very sensitive to narcotics with some nausea and vomiting but does well with medications.  She will be on antiemetics.  I will get MRI of the lumbar spine to see if there is any nerve derangement. Patient has bilateral knee replacements but has had MRI of the right wrist in July. She had "Minimally partial knee replacements" in [place] with Dr Alvina Filbert. [place]. Pls call to confirm if MRI compartible: [phone].  #2 hypothyroidism: Continue home levothyroxine  #3 depression: Continue home regimen  #4 chronic kidney disease stage III: Appears to be at baseline  #5 type 2 diabetes: Add sliding scale insulin to home regimen  #6 hypoxemia: Patient has history of lung disease secondary pulmonary fibrosis.  Not wheezing at the moment.   Continue oxygen as needed in the hospital   DVT prophylaxis: Lovenox Code Status: Full code Family Communication: Daughter at bedside Disposition Plan: To be determined Consults called: None Admission status: Observation  Severity of Illness: The appropriate patient status for this patient is OBSERVATION. Observation status is judged to be reasonable and necessary in order to provide the required intensity of service to ensure the patient's safety. The patient's presenting symptoms, physical exam findings, and initial radiographic and laboratory data in the context of their medical condition is felt to place them at decreased risk for further clinical deterioration. Furthermore, it is anticipated that the patient will be medically stable for discharge from the hospital within 2 midnights of admission. The following factors support the patient status of observation.   " The patient's presenting symptoms include low back pain. " The physical exam findings include decreased SLR. " The initial radiographic and laboratory data are degenerative disc disease on x-ray.     Barbette Merino MD [facility] Pager [phone]  If 7PM-7AM, please contact night-coverage [email or web address] Password [email or web address]  10/05/2018, 3:50 AM

## 2018-10-05 NOTE — NC FL2 (Signed)
[place] LEVEL OF CARE SCREENING TOOL     IDENTIFICATION  Patient Name: Anny Sayler Birthdate: Jul 27, 1921 Sex: female Admission Date (Current Location): 10/04/2018  [facility] and [place] Number:  Herbalist and Address:  [facility],  [place], [place]      Provider Number: [id number]  Attending Physician Name and Address:  Dessa Phi, DO  Relative Name and Phone Number:       Current Level of Care: Hospital Recommended Level of Care: [place] Prior Approval Number:    Date Approved/Denied:   PASRR Number:   [id number]    Discharge Plan: SNF    Current Diagnoses: Patient Active Problem List   Diagnosis Date Noted  . Hypoxia 10/05/2018  . Back pain, lumbosacral 10/05/2018  . Back pain 10/05/2018  . Ischemic colitis ([place]) 03/14/2018  . Colitis 03/13/2018  . Colitis with rectal bleeding 03/12/2018  . Sepsis ([place]) 05/23/2016  . Diverticulitis 05/23/2016  . GERD (gastroesophageal reflux disease) 05/23/2016  . Hypothyroidism 05/23/2016  . Depression 05/23/2016  . ILD (interstitial lung disease) ([place])   . Pulmonary fibrosis ([place])   . Acute UTI   . UTI (lower urinary tract infection)   . Hyperglycemia 02/20/2015  . Diabetes mellitus type 2 in obese ([place]) 02/20/2015  . CKD (chronic kidney disease) stage 3, GFR 30-59 ml/min (HCC) 02/20/2015  . UTI (urinary tract infection) 04/29/2014  . Diverticulitis of colon with bleeding 04/29/2014  . Acute respiratory failure with hypoxia ([place]) 04/29/2014  . Acute renal failure ([place]) 04/29/2014  . Hyperkalemia 04/29/2014  . Nausea and vomiting 04/29/2014    Orientation RESPIRATION BLADDER Height & Weight     Self, Time, Situation, Place  O2 Incontinent, External catheter Weight: 147 lb 0.8 oz (66.7 kg) Height:  5\' 4"  (162.6 cm)  BEHAVIORAL SYMPTOMS/MOOD NEUROLOGICAL BOWEL NUTRITION STATUS      Incontinent Diet(Heart Healthy)  AMBULATORY  STATUS COMMUNICATION OF NEEDS Skin   Extensive Assist Non-Verbally Normal                       Personal Care Assistance Level of Assistance  Bathing, Feeding, Dressing Bathing Assistance: Limited assistance Feeding assistance: Independent Dressing Assistance: Limited assistance     Functional Limitations Info  Sight, Hearing, Speech Sight Info: Adequate Hearing Info: Impaired Speech Info: Adequate    SPECIAL CARE FACTORS FREQUENCY  PT (By licensed PT), OT (By licensed OT)     PT Frequency: 5x/week OT Frequency: 5x/week            Contractures Contractures Info: Not present    Additional Factors Info  Code Status, Allergies, Psychotropic Code Status Info: Fullcode Allergies Info: Oxycodone-acetaminophen, Penicillins, Codeine, Percocet ,Oxycodone-acetaminophen, Tramadol           Current Medications (10/05/2018):  This is the current hospital active medication list Current Facility-Administered Medications  Medication Dose Route Frequency Provider Last Rate Last Dose  . enoxaparin (LOVENOX) injection 40 mg  40 mg Subcutaneous Q24H Gala Romney L, MD   40 mg at 10/05/18 0918  . HYDROcodone-acetaminophen (NORCO/VICODIN) 5-325 MG per tablet 1-2 tablet  1-2 tablet Oral Q4H PRN Elwyn Reach, MD   1 tablet at 10/05/18 0919  . [START ON 10/06/2018] levothyroxine (SYNTHROID) tablet 125 mcg  125 mcg Oral QAC breakfast Dessa Phi, DO      . LORazepam (ATIVAN) injection 0.5 mg  0.5 mg Intravenous Once PRN Dessa Phi, DO      .  morphine 2 MG/ML injection 2 mg  2 mg Intravenous Q2H PRN Rometta EmeryGarba, Mohammad L, MD   2 mg at 10/05/18 1117  . ondansetron (ZOFRAN) tablet 4 mg  4 mg Oral Q6H PRN Rometta EmeryGarba, Mohammad L, MD       Or  . ondansetron (ZOFRAN) injection 4 mg  4 mg Intravenous Q6H PRN Rometta EmeryGarba, Mohammad L, MD      . pantoprazole (PROTONIX) EC tablet 80 mg  80 mg Oral Daily Noralee Stainhoi, Jennifer, DO      . promethazine (PHENERGAN) injection 12.5 mg  12.5 mg Intravenous Q6H PRN  Noralee Stainhoi, Jennifer, DO   12.5 mg at 10/05/18 1117     Discharge Medications: Please see discharge summary for a list of discharge medications.  Relevant Imaging Results:  Relevant Lab Results:   Additional Information ZOX:[id number]  Clearance CootsNicole A Kadelyn Dimascio, LCSW

## 2018-10-05 NOTE — Evaluation (Signed)
Occupational Therapy Evaluation Patient Details Name: Shelia Black MRN: [id number] DOB: Feb 11, 1921 Today's Date: 10/05/2018    History of Present Illness Pt is a 85 y.o. female with PMH significant of HOH (even with [facility]), DDD, COPD, hypothyroidism, pulmonary fibrosis, spinal stenosis, diverticular disease and diabetes who has had chronic low back pain has had multiple injections in the past.  Presented to ED on 8/31 with persistent low back pain which is getting worse with any activities, unable to stand. Denies falls. MRI of spine pending.    Clinical Impression   Unsure of PLOF as Pt unreliable historian after pre-medication. Pt does live with daughter. Today Pt is total A +2 for all aspects of bed mobility, unable to safely attempt transfers at this time. Pt was able to sit EOB with at least min A for a brief time - but unable to sit unassisted. Pt able to perform simple grooming (hand to face movements) at bed level with min A, and Pt is VERY HOH. MRI pending of back. OT will follow acutely and Pt will require skilled OT post-acute at SNF level. Next session to continue back precaution education as well as continue working on bed mobility, transfers for toileting, and OOB tolerance.     Follow Up Recommendations  SNF;Supervision/Assistance - 24 hour    Equipment Recommendations  Other (comment)(defer to next venue of care)    Recommendations for Other Services       Precautions / Restrictions Precautions Precautions: Back Precaution Booklet Issued: No Precaution Comments: verbally reviewed Restrictions Weight Bearing Restrictions: No      Mobility Bed Mobility Overal bed mobility: Needs Assistance Bed Mobility: Rolling;Sidelying to Sit;Sit to Sidelying Rolling: Total assist;+2 for physical assistance;+2 for safety/equipment Sidelying to sit: Total assist;+2 for physical assistance;+2 for safety/equipment     Sit to sidelying: Total assist;+2 for physical  assistance;+2 for safety/equipment General bed mobility comments: total A +2 for all aspects of bed mobility  Transfers                 General transfer comment: unsafe to attempt this session    Balance Overall balance assessment: Needs assistance Sitting-balance support: Bilateral upper extremity supported;Feet supported Sitting balance-Leahy Scale: Poor Sitting balance - Comments: requires at least min A to maintain balance EOB                                   ADL either performed or assessed with clinical judgement   ADL Overall ADL's : Needs assistance/impaired Eating/Feeding: Minimal assistance;Bed level   Grooming: Moderate assistance;Bed level   Upper Body Bathing: Maximal assistance;Bed level   Lower Body Bathing: Total assistance   Upper Body Dressing : Maximal assistance   Lower Body Dressing: Total assistance;Bed level   Toilet Transfer: (unsafe to attempt at this time)   Toileting- Clothing Manipulation and Hygiene: Total assistance;Bed level Toileting - Clothing Manipulation Details (indicate cue type and reason): currently with purewick and adult diaper     Functional mobility during ADLs: (unable) General ADL Comments: limited cognition after medication, clinical reasoning utilized     Vision   Additional Comments: Pt able to read note for communication without glasses     Perception     Praxis      Pertinent Vitals/Pain Pain Assessment: Faces Faces Pain Scale: Hurts whole lot Pain Location: low back, hips Pain Descriptors / Indicators: Grimacing;Moaning;Discomfort;Sharp Pain Intervention(s): Limited activity within patient's tolerance;Monitored during  session;Repositioned;Premedicated before session     Hand Dominance     Extremity/Trunk Assessment Upper Extremity Assessment Upper Extremity Assessment: Generalized weakness   Lower Extremity Assessment Lower Extremity Assessment: Defer to PT evaluation   Cervical /  Trunk Assessment Cervical / Trunk Assessment: Kyphotic   Communication Communication Communication: HOH(VERY)   Cognition Arousal/Alertness: Suspect due to medications(pre-medicated with morphine) Behavior During Therapy: Flat affect Overall Cognitive Status: No family/caregiver present to determine baseline cognitive functioning                                 General Comments: Pt communicating, and pleasant - however after morphine administered, very lethargic, unreliable historian   General Comments  pending MRI    Exercises     Shoulder Instructions      Home Living Family/patient expects to be discharged to:: Private residence Living Arrangements: Children(Daughter)                               Additional Comments: post-medication, and Pt is unable to share home details      Prior Functioning/Environment          Comments: unsure        OT Problem List: Decreased range of motion;Decreased activity tolerance;Impaired balance (sitting and/or standing);Decreased cognition;Decreased safety awareness;Decreased knowledge of use of DME or AE;Decreased knowledge of precautions;Pain      OT Treatment/Interventions: Self-care/ADL training;DME and/or AE instruction;Therapeutic activities;Patient/family education;Balance training    OT Goals(Current goals can be found in the care plan section) Acute Rehab OT Goals Patient Stated Goal: none stated ADL Goals Pt Will Perform Grooming: with supervision;sitting Pt Will Perform Upper Body Dressing: with supervision;sitting Pt Will Perform Lower Body Dressing: with mod assist;sit to/from stand Pt Will Transfer to Toilet: with mod assist;ambulating Pt Will Perform Toileting - Clothing Manipulation and hygiene: with mod assist;sit to/from stand Additional ADL Goal #1: Pt will perform bed mobility with Mod A prior to engaging in ADL - performing log roll technique  OT Frequency: Min 2X/week   Barriers  to D/C:            Co-evaluation PT/OT/SLP Co-Evaluation/Treatment: Yes Reason for Co-Treatment: For patient/therapist safety;To address functional/ADL transfers PT goals addressed during session: Mobility/safety with mobility;Balance OT goals addressed during session: ADL's and self-care      AM-PAC OT "6 Clicks" Daily Activity     Outcome Measure Help from another person eating meals?: A Little Help from another person taking care of personal grooming?: A Lot Help from another person toileting, which includes using toliet, bedpan, or urinal?: Total Help from another person bathing (including washing, rinsing, drying)?: A Lot Help from another person to put on and taking off regular upper body clothing?: A Lot Help from another person to put on and taking off regular lower body clothing?: Total 6 Click Score: 11   End of Session Equipment Utilized During Treatment: Oxygen Nurse Communication: Mobility status;Precautions  Activity Tolerance: Patient limited by pain;Patient limited by lethargy Patient left: in bed;with call bell/phone within reach;with bed alarm set;with nursing/sitter in room  OT Visit Diagnosis: Unsteadiness on feet (R26.81);Other abnormalities of gait and mobility (R26.89);Muscle weakness (generalized) (M62.81);Other symptoms and signs involving cognitive function;Pain Pain - part of body: (back)                Time: 1117-1130 OT Time Calculation (min): 13 min Charges:  OT General Charges $OT Visit: 1 Visit OT Evaluation $OT Eval Moderate Complexity: 1 Mod  Sherryl MangesLaura Sharri Loya OTR/L Acute Rehabilitation Services Pager: [phone] Office: [phone]  Evern BioLaura J Lyriq Jarchow 10/05/2018, 12:05 PM

## 2018-10-05 NOTE — Progress Notes (Signed)
  PROGRESS NOTE  Patient admitted earlier this morning. See H&P. Shelia Black is a 85 y.o. female with medical history significant of degenerative disc disease, COPD, GERD, hypothyroidism, pulmonary fibrosis, diverticular disease and diabetes who has had chronic low back pain with bulging disc and has had multiple injections in the past.  She is also had spinal stenosis for which she has been seen by orthopedics.  Patient came in with persistent low back pain which is getting worse with any activities over the past week.  Patient has received multiple medications for pain in the ER but pain is persistent.  She is unable to stand on her feet.  Patient was admitted for intractable back pain.  Patient hard of hearing, tells me that she has had no acute injuries or falls in the past week.  Her pain is located in her lumbar spine near her sacrum, has point tenderness to palpation.  Pain medication this morning has not helped yet.  States that she is not able to move her right lower extremity secondary to severe pain.  On examination, patellar reflexes are intact bilaterally.  She does have limited mobility of her right lower extremity secondary to extreme pain.  She is able to move her feet and toes equally bilaterally.  Acute on chronic lumbar back pain -Has previously been diagnosed with degenerative disc disease, has had outpatient evaluation with injections in the past (on record review, patient was diagnosed with sacroiliitis back in 2019) -Lumbar spine and right hip x-ray revealed degenerative changes -MRI lumbar spine for acute worsening pain in the last week, without injury -Continue IV pain medication for intractable back pain -PT OT recommending skilled nursing facility placement  Hypothyroidism -Continue Synthroid  CKD stage III -Stable. Baseline Cr ~ 0.9-1  History of pulmonary fibrosis -Currently stable -She is requiring oxygen in the hospital, but I wonder if this is secondary to  her pain crisis rather than pulmonary etiology.  Patient had no complaints of shortness of breath on examination today    Dessa Phi, DO [facility] [email or web address] 10/05/2018, 1:21 PM

## 2018-10-05 NOTE — Evaluation (Signed)
Physical Therapy Evaluation Patient Details Name: Shelia BreedingDorothy Black MRN: [id number] DOB: January 20, 1922 Today's Date: 10/05/2018   History of Present Illness  Pt is a 85 y.o. female with PMH significant of HOH (even with  hearing aides), DDD, COPD, hypothyroidism, pulmonary fibrosis, spinal stenosis, diverticular disease and diabetes who has had chronic low back pain has had multiple injections in the past.  Presented to ED on 8/31 with persistent low back pain which is getting worse with any activities, unable to stand. Denies falls. MRI of spine pending.  Clinical Impression  Pt admitted with above diagnosis.  Pt currently with functional limitations due to the deficits listed below (see PT Problem List). Pt will benefit from skilled PT to increase their independence and safety with mobility to allow discharge to the venue listed below.  RN reported pt crying in pain this morning so RN provided with IV morphine prior to attempt to mobilize.  Pt however then presented with decreased cognition and still appeared in pain with bed mobility and required total assist.  Per chart, pt from home with daughter.  Pt will likely need SNF upon d/c.     Follow Up Recommendations SNF    Equipment Recommendations  Rolling walker with 5" wheels    Recommendations for Other Services       Precautions / Restrictions Precautions Precautions: Back;Fall Precaution Booklet Issued: No Precaution Comments: verbally reviewed Restrictions Weight Bearing Restrictions: No      Mobility  Bed Mobility Overal bed mobility: Needs Assistance Bed Mobility: Rolling;Sidelying to Sit;Sit to Sidelying Rolling: Total assist;+2 for physical assistance;+2 for safety/equipment Sidelying to sit: Total assist;+2 for physical assistance;+2 for safety/equipment     Sit to sidelying: Total assist;+2 for physical assistance;+2 for safety/equipment General bed mobility comments: total A +2 for all aspects of bed  mobility  Transfers                 General transfer comment: unsafe to attempt this session  Ambulation/Gait                Stairs            Wheelchair Mobility    Modified Rankin (Stroke Patients Only)       Balance Overall balance assessment: Needs assistance Sitting-balance support: Bilateral upper extremity supported;Feet supported Sitting balance-Leahy Scale: Poor Sitting balance - Comments: requires at least min A to maintain balance EOB                                     Pertinent Vitals/Pain Pain Assessment: Faces Faces Pain Scale: Hurts whole lot Pain Location: low back, hips Pain Descriptors / Indicators: Grimacing;Moaning;Discomfort;Sharp Pain Intervention(s): Monitored during session;Repositioned;Premedicated before session;Limited activity within patient's tolerance    Home Living Family/patient expects to be discharged to:: Private residence Living Arrangements: Children(Daughter)               Additional Comments: post-medication, and Pt is unable to share home details    Prior Function           Comments: unsure     Hand Dominance        Extremity/Trunk Assessment   Upper Extremity Assessment Upper Extremity Assessment: Generalized weakness    Lower Extremity Assessment Lower Extremity Assessment: Generalized weakness    Cervical / Trunk Assessment Cervical / Trunk Assessment: Kyphotic  Communication   Communication: HOH(VERY)  Cognition Arousal/Alertness: Suspect due to medications(pre-medicated with  morphine) Behavior During Therapy: Flat affect Overall Cognitive Status: No family/caregiver present to determine baseline cognitive functioning                                 General Comments: Pt communicating, and pleasant - however after morphine administered, very lethargic, unreliable historian      General Comments General comments (skin integrity, edema, etc.):  pending MRI    Exercises     Assessment/Plan    PT Assessment Patient needs continued PT services  PT Problem List Decreased strength;Decreased activity tolerance;Decreased balance;Decreased knowledge of use of DME;Decreased mobility;Decreased knowledge of precautions;Pain       PT Treatment Interventions DME instruction;Therapeutic exercise;Gait training;Balance training;Functional mobility training;Therapeutic activities;Patient/family education    PT Goals (Current goals can be found in the Care Plan section)  Acute Rehab PT Goals Patient Stated Goal: none stated PT Goal Formulation: Patient unable to participate in goal setting Time For Goal Achievement: 10/19/18 Potential to Achieve Goals: Good    Frequency Min 2X/week   Barriers to discharge        Co-evaluation PT/OT/SLP Co-Evaluation/Treatment: Yes Reason for Co-Treatment: For patient/therapist safety;To address functional/ADL transfers PT goals addressed during session: Mobility/safety with mobility OT goals addressed during session: ADL's and self-care       AM-PAC PT "6 Clicks" Mobility  Outcome Measure Help needed turning from your back to your side while in a flat bed without using bedrails?: Total Help needed moving from lying on your back to sitting on the side of a flat bed without using bedrails?: Total Help needed moving to and from a bed to a chair (including a wheelchair)?: Total Help needed standing up from a chair using your arms (e.g., wheelchair or bedside chair)?: Total Help needed to walk in hospital room?: Total Help needed climbing 3-5 steps with a railing? : Total 6 Click Score: 6    End of Session   Activity Tolerance: Patient limited by lethargy Patient left: in bed;with call bell/phone within reach;with bed alarm set;with nursing/sitter in room Nurse Communication: Mobility status PT Visit Diagnosis: Difficulty in walking, not elsewhere classified (R26.2)    Time: 1117-1130 PT Time  Calculation (min) (ACUTE ONLY): 13 min   Charges:   PT Evaluation $PT Eval Low Complexity: [place], PT, DPT Acute Rehabilitation Services Office: [phone] Pager: [place] 10/05/2018, 12:59 PM

## 2018-10-06 ENCOUNTER — Other Ambulatory Visit: Payer: Self-pay

## 2018-10-06 LAB — CBC
HCT: 43.5 % (ref 36.0–46.0)
Hemoglobin: 14.2 g/dL (ref 12.0–15.0)
MCH: 32.6 pg (ref 26.0–34.0)
MCHC: 32.6 g/dL (ref 30.0–36.0)
MCV: 99.8 fL (ref 80.0–100.0)
Platelets: 210 10*3/uL (ref 150–400)
RBC: 4.36 MIL/uL (ref 3.87–5.11)
RDW: 13.6 % (ref 11.5–15.5)
WBC: 12.6 10*3/uL — ABNORMAL HIGH (ref 4.0–10.5)
nRBC: 0 % (ref 0.0–0.2)

## 2018-10-06 LAB — BASIC METABOLIC PANEL
Anion gap: 10 (ref 5–15)
BUN: 15 mg/dL (ref 8–23)
CO2: 26 mmol/L (ref 22–32)
Calcium: 9.1 mg/dL (ref 8.9–10.3)
Chloride: 99 mmol/L (ref 98–111)
Creatinine, Ser: 0.94 mg/dL (ref 0.44–1.00)
GFR calc Af Amer: >60 mL/min (ref >60)
GFR calc non Af Amer: 52 mL/min — ABNORMAL LOW (ref >60)
Glucose, Bld: 154 mg/dL — ABNORMAL HIGH (ref 70–99)
Potassium: 4.5 mmol/L (ref 3.5–5.1)
Sodium: 135 mmol/L (ref 135–145)

## 2018-10-06 MED ORDER — MORPHINE SULFATE (PF) 2 MG/ML IV SOLN: 1.0000 mg | INTRAVENOUS | Status: DC | PRN

## 2018-10-06 MED ORDER — PREDNISONE 50 MG PO TABS
50.0000 mg | ORAL_TABLET | Freq: Every day | ORAL | Status: DC
  Administered 2018-10-06 – 2018-10-08 (×3): 50 mg via ORAL
  Filled 2018-10-06 (×3): qty 1

## 2018-10-06 NOTE — Progress Notes (Addendum)
PROGRESS NOTE    Shelia Black  QTM:[id number] DOB: 1921/02/24 DOA: 10/04/2018 PCP: [name], PA-C     Brief Narrative:  Shelia Black a 85 y.o.femalewith medical history significant ofdegenerative disc disease, COPD, GERD, hypothyroidism, pulmonary fibrosis, diverticular disease and diabetes who has had chronic low back pain with bulging disc and has had multiple injections in the past. She is also had spinal stenosis for which she has been seen by orthopedics. Patient came in with persistent low back pain which is getting worse with any activities over the past week. Patient has received multiple medications for pain in the ER but pain is persistent. She is unable to stand on her feet.  Patient was admitted for intractable back pain.   New events last 24 hours / Subjective: No issues per nursing staff. Slept well overnight without pain issues. Resting comfortably this morning.    Assessment & Plan:   Principal Problem:   Back pain, lumbosacral Active Problems:   Diabetes mellitus type 2 in obese (HCC)   CKD (chronic kidney disease) stage 3, GFR 30-59 ml/min (HCC)   GERD (gastroesophageal reflux disease)   Hypothyroidism   Depression   Hypoxia   Back pain    Acute on chronic lumbar back pain -Has previously been diagnosed with degenerative disc disease, has had outpatient evaluation with injections in the past (on record review, patient was diagnosed with sacroiliitis back in 2019) -Has outpatient ortho follow up scheduled for 9/10 -Lumbar spine and right hip x-ray revealed degenerative changes -MRI lumbar spine without acute findings  -Continue IV pain medication for intractable back pain -PT OT recommending skilled nursing facility placement  Hypothyroidism -Continue Synthroid  CKD stage III -Stable. Baseline Cr ~ 0.9-1  History of pulmonary fibrosis -Currently stable -She is requiring oxygen in the hospital, but I wonder if this is  secondary to her pain crisis rather than pulmonary etiology.  Patient had no complaints of shortness of breath on examination     DVT prophylaxis: Lovenox  Code Status: Full Family Communication: Daughter updated over the phone Disposition Plan: SNF recommended   Consultants:   None  Procedures:   None  Antimicrobials:  Anti-infectives (From admission, onward)   None        Objective: Vitals:   10/05/18 0638 10/05/18 1342 10/05/18 2201 10/06/18 0445  BP: (!) 127/97 (!) 148/82 (!) 134/50 (!) 125/50  Pulse: 62 68 71 85  Resp: 18 16 (!) 24 (!) 24  Temp: 98 F (36.7 C) 98.2 F (36.8 C) 99.5 F (37.5 C) 99.7 F (37.6 C)  TempSrc: Oral Oral Oral Oral  SpO2: 100% 100% 92% 90%  Weight: 66.7 kg     Height:        Intake/Output Summary (Last 24 hours) at 10/06/2018 1115 Last data filed at 10/06/2018 6256 Gross per 24 hour  Intake 258.46 ml  Output 700 ml  Net -441.54 ml   Filed Weights   10/04/18 2310 10/05/18 0638  Weight: 65.8 kg 66.7 kg    Examination:  General exam: Appears calm and comfortable   Respiratory system: Clear to auscultation. Respiratory effort normal. No respiratory distress.  Cardiovascular system: S1 & S2 heard, RRR. No murmurs. No pedal edema. Gastrointestinal system: Abdomen is nondistended, soft and nontender. Normal bowel sounds heard. Extremities: Symmetric in appearance  Skin: No rashes, lesions or ulcers on exposed skin     Data Reviewed: I have personally reviewed following labs and imaging studies  CBC: Recent Labs  Lab 10/04/18 2333  10/06/18 0518  WBC 12.7* 12.6*  HGB 13.6 14.2  HCT 41.3 43.5  MCV 100.2* 99.8  PLT 235 809   Basic Metabolic Panel: Recent Labs  Lab 10/04/18 2333 10/06/18 0518  NA 135 135  K 4.4 4.5  CL 102 99  CO2 23 26  GLUCOSE 173* 154*  BUN 21 15  CREATININE 0.97 0.94  CALCIUM 9.1 9.1   GFR: Estimated Creatinine Clearance: 34.4 mL/min (by C-G formula based on SCr of 0.94 mg/dL). Liver  Function Tests: No results for input(s): AST, ALT, ALKPHOS, BILITOT, PROT, ALBUMIN in the last 168 hours. No results for input(s): LIPASE, AMYLASE in the last 168 hours. No results for input(s): AMMONIA in the last 168 hours. Coagulation Profile: No results for input(s): INR, PROTIME in the last 168 hours. Cardiac Enzymes: No results for input(s): CKTOTAL, CKMB, CKMBINDEX, TROPONINI in the last 168 hours. BNP (last 3 results) No results for input(s): PROBNP in the last 8760 hours. HbA1C: No results for input(s): HGBA1C in the last 72 hours. CBG: No results for input(s): GLUCAP in the last 168 hours. Lipid Profile: No results for input(s): CHOL, HDL, LDLCALC, TRIG, CHOLHDL, LDLDIRECT in the last 72 hours. Thyroid Function Tests: No results for input(s): TSH, T4TOTAL, FREET4, T3FREE, THYROIDAB in the last 72 hours. Anemia Panel: No results for input(s): VITAMINB12, FOLATE, FERRITIN, TIBC, IRON, RETICCTPCT in the last 72 hours. Sepsis Labs: No results for input(s): PROCALCITON, LATICACIDVEN in the last 168 hours.  Recent Results (from the past 240 hour(s))  SARS Coronavirus 2 [facility] order, Performed in [place] hospital lab)     Status: None   Collection Time: 10/05/18  7:40 AM  Result Value Ref Range Status   SARS Coronavirus 2 NEGATIVE NEGATIVE Final    Comment: (NOTE) If result is NEGATIVE SARS-CoV-2 target nucleic acids are NOT DETECTED. The SARS-CoV-2 RNA is generally detectable in upper and lower  respiratory specimens during the acute phase of infection. The lowest  concentration of SARS-CoV-2 viral copies this assay can detect is 250  copies / mL. A negative result does not preclude SARS-CoV-2 infection  and should not be used as the sole basis for treatment or other  patient management decisions.  A negative result may occur with  improper specimen collection / handling, submission of specimen other  than nasopharyngeal swab, presence of viral mutation(s) within the   areas targeted by this assay, and inadequate number of viral copies  (<250 copies / mL). A negative result must be combined with clinical  observations, patient history, and epidemiological information. If result is POSITIVE SARS-CoV-2 target nucleic acids are DETECTED. The SARS-CoV-2 RNA is generally detectable in upper and lower  respiratory specimens dur ing the acute phase of infection.  Positive  results are indicative of active infection with SARS-CoV-2.  Clinical  correlation with patient history and other diagnostic information is  necessary to determine patient infection status.  Positive results do  not rule out bacterial infection or co-infection with other viruses. If result is PRESUMPTIVE POSTIVE SARS-CoV-2 nucleic acids MAY BE PRESENT.   A presumptive positive result was obtained on the submitted specimen  and confirmed on repeat testing.  While 2019 novel coronavirus  (SARS-CoV-2) nucleic acids may be present in the submitted sample  additional confirmatory testing may be necessary for epidemiological  and / or clinical management purposes  to differentiate between  SARS-CoV-2 and other Sarbecovirus currently known to infect humans.  If clinically indicated additional testing with an alternate test  methodology [phone]) is advised. The SARS-CoV-2 RNA is generally  detectable in upper and lower respiratory sp ecimens during the acute  phase of infection. The expected result is Negative. Fact Sheet for Patients:  StrictlyIdeas.no Fact Sheet for Healthcare Providers: BankingDealers.co.za This test is not yet approved or cleared by the [place] FDA and has been authorized for detection and/or diagnosis of SARS-CoV-2 by FDA under an Emergency Use Authorization (EUA).  This EUA will remain in effect (meaning this test can be used) for the duration of the COVID-19 declaration under Section 564(b)(1) of the Act, 21 U.S.C.  section 360bbb-3(b)(1), unless the authorization is terminated or revoked sooner. Performed at [facility], [place] [address] [place], [place] [address]       Radiology Studies: Dg Lumbar Spine Complete  Result Date: 10/05/2018 CLINICAL DATA:  Lumbosacral back pain. EXAM: LUMBAR SPINE - COMPLETE 4+ VIEW COMPARISON:  Lumbar spine CT 03/31/2018 FINDINGS: The alignment is maintained, similar straightening to prior exam. Vertebral body heights are normal. There is no listhesis. Disc space narrowing and endplate spurring at G1-W2, L5-S1, and L2-L3. Multilevel facet hypertrophy. The posterior elements are intact. No fracture. Sacroiliac joints are symmetric and normal. Aortic atherosclerosis. IMPRESSION: Multilevel degenerative disc disease and facet arthropathy throughout the lumbar spine. No acute osseous abnormality. Electronically Signed   By: Keith Rake M.D.   On: 10/05/2018 02:36   Mr Lumbar Spine W Wo Contrast  Result Date: 10/05/2018 CLINICAL DATA:  Right hip pain radiating into groin. No known injury. EXAM: MRI LUMBAR SPINE WITHOUT AND WITH CONTRAST TECHNIQUE: Multiplanar and multiecho pulse sequences of the lumbar spine were obtained without and with intravenous contrast. CONTRAST:  7 cc Gadavist COMPARISON:  Lumbar spine radiographs 10/05/2018. CT lumbar spine, abdomen and pelvis 03/31/2018 FINDINGS: Segmentation: Conventional anatomy assumed, with the last open disc space designated L5-S1.Concordant with previous imaging. Alignment: Mild straightening and mild convex left scoliosis, stable. Vertebrae: No worrisome osseous lesion, acute fracture or pars defect. There are chronic endplate degenerative changes at L2-3, L4-5 and L5-S1. No abnormal marrow enhancement. The lumbar pedicles are somewhat short on a congenital basis.The visualized sacroiliac joints appear unremarkable. Conus medullaris: Extends to the L2 level and appears normal. No abnormal intradural  enhancement. Paraspinal and other soft tissues: No significant paraspinal findings. The urinary bladder is moderately distended, similar to previous studies. There are small left renal cysts. Disc levels: No significant disc space findings from T11-12 through L1-2. L2-3: Loss of disc height with annular disc bulging and endplate osteophytes. Mild facet and ligamentous hypertrophy. There is mild narrowing of the lateral recesses. The foramina are patent. L3-4: Disc height is relatively preserved. Annular disc bulging, facet and ligamentous hypertrophy superimposed on congenital factors contribute to moderate multifactorial spinal stenosis with mild narrowing of the lateral recesses and foramina bilaterally. L4-5: Chronic degenerative disc disease with annular disc bulging and endplate osteophytes asymmetric to the right. Facet hypertrophy is also asymmetric to the right and contributes to moderate right foraminal narrowing. There is mild narrowing of the lateral recesses, left greater than right. L5-S1: Remote laminectomy defect on the left. Chronic loss of disc height with endplate osteophytes asymmetric to the left and bilateral facet hypertrophy. Mild left foraminal narrowing appears stable. IMPRESSION: 1. No acute findings or clear explanation for the patient's symptoms. 2. Multilevel spondylosis, similar to previous CTs from 03/31/2018. 3. Moderate multifactorial spinal stenosis at L3-4 with mild narrowing of the lateral recesses and foramina. 4. Chronic right foraminal narrowing at L4-5 with  potential for chronic right L4 nerve root encroachment. 5. Postsurgical changes on the left at L5-S1. Electronically Signed   By: Richardean Sale M.D.   On: 10/05/2018 19:36   Dg Hip Unilat W Or Wo Pelvis 2-3 Views Right  Result Date: 10/05/2018 CLINICAL DATA:  Right hip pain. EXAM: DG HIP (WITH OR WITHOUT PELVIS) 2-3V RIGHT COMPARISON:  None. FINDINGS: The cortical margins of the bony pelvis and right hip are intact.  No fracture. Pubic symphysis and sacroiliac joints are congruent. Both femoral heads are well-seated in the respective acetabula. Age related acetabular spurring bilaterally. IMPRESSION: Mild degenerative change for age.  No acute osseous abnormality. Electronically Signed   By: Keith Rake M.D.   On: 10/05/2018 02:38      Scheduled Meds: . enoxaparin (LOVENOX) injection  40 mg Subcutaneous Q24H  . levothyroxine  125 mcg Oral QAC breakfast  . pantoprazole  80 mg Oral Daily   Continuous Infusions:   LOS: 1 day      Time spent: 25 minutes   Dessa Phi, DO [facility] [email or web address] 10/06/2018, 11:15 AM

## 2018-10-06 NOTE — TOC Initial Note (Signed)
Transition of Care [facility](TOC) - Initial/Assessment Note    Patient Details  Name: Shelia Black MRN: [id number] Date of Birth: [birth year]  Transition of Care [facility](TOC) CM/SW Contact:    Althea CharonAshley C Kuulei Kleier, LCSW Phone Number: 10/06/2018, 4:15 PM  Clinical Narrative:     Spoke with patients daughter Eunice BlaseDebbie at bedside as patient slept. Eunice BlaseDebbie stated she is agreeable for patient to go to rehab. CSW gave daughter a list of rehab facilities that have accepted patient. CSW encouraged daughter to look over rehab list and reach back out to CSW once decision has been made                Expected Discharge Plan: Skilled Nursing Facility Barriers to Discharge: Continued Medical Work up   Patient Goals and CMS Choice Patient states their goals for this hospitalization and ongoing recovery are:: to go home CMS Medicare.gov Compare Post Acute Care list provided to:: Other (Comment Required)(given to daughter) Choice offered to / list presented to : Adult Children  Expected Discharge Plan and Services Expected Discharge Plan: Skilled Nursing Facility In-house Referral: Clinical Social Work   Post Acute Care Choice: Skilled Nursing Facility Living arrangements for the past 2 months: Single Family Home Expected Discharge Date: (unknown)                                    Prior Living Arrangements/Services Living arrangements for the past 2 months: Single Family Home Lives with:: Self, Adult Children Patient language and need for interpreter reviewed:: Yes Do you feel safe going back to the place where you live?: Yes      Need for Family Participation in Patient Care: Yes (Comment) Care giver support system in place?: Yes (comment)   Criminal Activity/Legal Involvement Pertinent to Current Situation/Hospitalization: No - Comment as needed  Activities of Daily Living Home Assistive Devices/Equipment: Eyeglasses, Dan HumphreysWalker (specify type) ADL Screening (condition at time of admission) Patient's  cognitive ability adequate to safely complete daily activities?: Yes Is the patient deaf or have difficulty hearing?: Yes Does the patient have difficulty seeing, even when wearing glasses/contacts?: No Does the patient have difficulty concentrating, remembering, or making decisions?: Yes Patient able to express need for assistance with ADLs?: Yes Does the patient have difficulty dressing or bathing?: Yes Independently performs ADLs?: No Communication: Independent Dressing (OT): Needs assistance Is this a change from baseline?: Pre-admission baseline Grooming: Needs assistance Is this a change from baseline?: Pre-admission baseline Feeding: Needs assistance Is this a change from baseline?: Pre-admission baseline Bathing: Needs assistance Is this a change from baseline?: Pre-admission baseline Toileting: Needs assistance Is this a change from baseline?: Pre-admission baseline In/Out Bed: Needs assistance Is this a change from baseline?: Pre-admission baseline Walks in Home: Needs assistance Is this a change from baseline?: Pre-admission baseline Does the patient have difficulty walking or climbing stairs?: Yes Weakness of Legs: Both Weakness of Arms/Hands: None  Permission Sought/Granted Permission sought to share information with : Family Supports Permission granted to share information with : Yes, Verbal Permission Granted  Share Information with NAME: Geoffery SpruceDebbie [name]  Permission granted to share info w AGENCY: snf  Permission granted to share info w Relationship: daughter  Permission granted to share info w Contact Information: [phone]  Emotional Assessment Appearance:: Appears stated age Attitude/Demeanor/Rapport: Engaged Affect (typically observed): Accepting Orientation: : Oriented to Self, Oriented to Place, Oriented to  Time, Oriented to Situation Alcohol / Substance Use: Not Applicable  Psych Involvement: No (comment)  Admission diagnosis:  Hip Pain Back  Pain Patient Active Problem List   Diagnosis Date Noted  . Hypoxia 10/05/2018  . Back pain, lumbosacral 10/05/2018  . Back pain 10/05/2018  . Ischemic colitis ([place]) 03/14/2018  . Colitis 03/13/2018  . Colitis with rectal bleeding 03/12/2018  . Sepsis ([place]) 05/23/2016  . Diverticulitis 05/23/2016  . GERD (gastroesophageal reflux disease) 05/23/2016  . Hypothyroidism 05/23/2016  . Depression 05/23/2016  . ILD (interstitial lung disease) ([place])   . Pulmonary fibrosis ([place])   . Acute UTI   . UTI (lower urinary tract infection)   . Hyperglycemia 02/20/2015  . Diabetes mellitus type 2 in obese ([place]) 02/20/2015  . CKD (chronic kidney disease) stage 3, GFR 30-59 ml/min (HCC) 02/20/2015  . UTI (urinary tract infection) 04/29/2014  . Diverticulitis of colon with bleeding 04/29/2014  . Acute respiratory failure with hypoxia ([place]) 04/29/2014  . Acute renal failure ([place]) 04/29/2014  . Hyperkalemia 04/29/2014  . Nausea and vomiting 04/29/2014   PCP:  [name], PA-C Pharmacy:   [facility] [place], [place] [address] Phone: [phone] Fax: [phone]     Social Determinants of Health (SDOH) Interventions    Readmission Risk Interventions No flowsheet data found.

## 2018-10-07 DIAGNOSIS — E039 Hypothyroidism, unspecified: Secondary | ICD-10-CM

## 2018-10-07 DIAGNOSIS — K219 Gastro-esophageal reflux disease without esophagitis: Secondary | ICD-10-CM

## 2018-10-07 DIAGNOSIS — N183 Chronic kidney disease, stage 3 (moderate): Secondary | ICD-10-CM

## 2018-10-07 LAB — CBC WITH DIFFERENTIAL/PLATELET
Abs Immature Granulocytes: 0.05 10*3/uL (ref 0.00–0.07)
Basophils Absolute: 0 10*3/uL (ref 0.0–0.1)
Basophils Relative: 0 %
Eosinophils Absolute: 0 10*3/uL (ref 0.0–0.5)
Eosinophils Relative: 0 %
HCT: 39.5 % (ref 36.0–46.0)
Hemoglobin: 12.9 g/dL (ref 12.0–15.0)
Immature Granulocytes: 0 %
Lymphocytes Relative: 6 %
Lymphs Abs: 0.8 10*3/uL (ref 0.7–4.0)
MCH: 32.9 pg (ref 26.0–34.0)
MCHC: 32.7 g/dL (ref 30.0–36.0)
MCV: 100.8 fL — ABNORMAL HIGH (ref 80.0–100.0)
Monocytes Absolute: 1 10*3/uL (ref 0.1–1.0)
Monocytes Relative: 9 %
Neutro Abs: 10.4 10*3/uL — ABNORMAL HIGH (ref 1.7–7.7)
Neutrophils Relative %: 85 %
Platelets: 243 10*3/uL (ref 150–400)
RBC: 3.92 MIL/uL (ref 3.87–5.11)
RDW: 13.5 % (ref 11.5–15.5)
WBC: 12.3 10*3/uL — ABNORMAL HIGH (ref 4.0–10.5)
nRBC: 0 % (ref 0.0–0.2)

## 2018-10-07 LAB — CBC
HCT: 39.9 % (ref 36.0–46.0)
Hemoglobin: 13 g/dL (ref 12.0–15.0)
MCH: 32.7 pg (ref 26.0–34.0)
MCHC: 32.6 g/dL (ref 30.0–36.0)
MCV: 100.3 fL — ABNORMAL HIGH (ref 80.0–100.0)
Platelets: 216 10*3/uL (ref 150–400)
RBC: 3.98 MIL/uL (ref 3.87–5.11)
RDW: 13.3 % (ref 11.5–15.5)
WBC: 9.5 10*3/uL (ref 4.0–10.5)
nRBC: 0 % (ref 0.0–0.2)

## 2018-10-07 NOTE — Progress Notes (Addendum)
PROGRESS NOTE    Shelia Black  TGP:[id number] DOB: [birth year] DOA: 10/04/2018 PCP: Shelia Stalker, PA-C    Brief Narrative:  Shelia Black a 85 y.o.femalewith medical history significant ofdegenerative disc disease, COPD, GERD, hypothyroidism, pulmonary fibrosis, diverticular disease and diabetes who has had chronic low back painwith bulging disc andhas had multiple injections in the past. She is also had spinal stenosis for which she has been seen by orthopedics. Patient came in with persistent low back pain which is getting worse with any activitiesover the past week. Patient has received multiple medications for pain in the ER but pain is persistent. She is unable to stand on her feet.Patient was admitted for intractable back pain.     Assessment & Plan:   Principal Problem:   Back pain, lumbosacral Active Problems:   Diabetes mellitus type 2 in obese (HCC)   CKD (chronic kidney disease) stage 3, GFR 30-59 ml/min (HCC)   GERD (gastroesophageal reflux disease)   Hypothyroidism   Depression   Hypoxia   Back pain  1 acute on chronic lumbar back pain Patient noted has previously been diagnosed with degenerative disc disease with outpatient evaluation with injections in the past.  Patient noted to have been diagnosed with sacroiliitis back in 2019 on record review.  Plain films of the lumbar spine and right hip and pelvis reveal degenerative changes with no acute changes noted.  MRI of the L-spine with no acute findings.  Patient was receiving IV pain medication for intractable pain.  Patient on Vicodin as needed.  Patient scheduled to follow-up with orthopedics in the outpatient setting scheduled for 10/15/2018.  Continue current pain management.  Heat compresses as needed.  Patient seems to have been started on prednisone which will taper.  Follow.  2.  Hypothyroidism Continue Synthroid.  Outpatient follow-up.  3.  Chronic kidney disease stage  III Stable.  Currently at baseline.  4.  History of pulmonary fibrosis Stable.  Outpatient follow-up.  O2 PRN.  5.  Gastroesophageal reflux disease Continue PPI.   DVT prophylaxis: Lovenox Code Status: Full Family Communication: Updated patient.  No family at bedside. Disposition Plan: Skilled nursing facility hopefully in the next 24 hours once pain is better controlled.   Consultants:   None  Procedures:   Plain films of the L-spine 10/05/2018  MRI L-spine 10/05/2018  Plain films of the right hip and pelvis 10/05/2018    Antimicrobials:   None   Subjective: Patient c/o neck pain. Feels lower back pain slowly improving from admission.  Denies any chest pain or shortness of breath.  Objective: Vitals:   10/06/18 0445 10/06/18 1716 10/06/18 2053 10/07/18 0632  BP: (!) 125/50 (!) 111/44 (!) 121/54 (!) 109/50  Pulse: 85 64 61 (!) 53  Resp: (!) 24 18 (!) 22 (!) 24  Temp: 99.7 F (37.6 C) 98.4 F (36.9 C) (!) 97.5 F (36.4 C) (!) 97.5 F (36.4 C)  TempSrc: Oral Oral Oral Oral  SpO2: 90% 95% 95% 95%  Weight:      Height:        Intake/Output Summary (Last 24 hours) at 10/07/2018 1135 Last data filed at 10/07/2018 1052 Gross per 24 hour  Intake 120 ml  Output --  Net 120 ml   Filed Weights   10/04/18 2310 10/05/18 0638  Weight: 65.8 kg 66.7 kg    Examination:  General exam: NAD Respiratory system: Clear to auscultation anterior lung fields. Respiratory effort normal. Cardiovascular system: S1 & S2 heard, RRR. No JVD, murmurs,  rubs, gallops or clicks. No pedal edema. Gastrointestinal system: Abdomen is nondistended, soft and nontender. No organomegaly or masses felt. Normal bowel sounds heard. Central nervous system: Alert and oriented. No focal neurological deficits. Extremities: Symmetric 5 x 5 power. Skin: No rashes, lesions or ulcers Psychiatry: Judgement and insight appear normal. Mood & affect appropriate.     Data Reviewed: I have personally  reviewed following labs and imaging studies  CBC: Recent Labs  Lab 10/04/18 2333 10/06/18 0518 10/07/18 0441  WBC 12.7* 12.6* 9.5  HGB 13.6 14.2 13.0  HCT 41.3 43.5 39.9  MCV 100.2* 99.8 100.3*  PLT 235 210 532   Basic Metabolic Panel: Recent Labs  Lab 10/04/18 2333 10/06/18 0518  NA 135 135  K 4.4 4.5  CL 102 99  CO2 23 26  GLUCOSE 173* 154*  BUN 21 15  CREATININE 0.97 0.94  CALCIUM 9.1 9.1   GFR: Estimated Creatinine Clearance: 34.4 mL/min (by C-G formula based on SCr of 0.94 mg/dL). Liver Function Tests: No results for input(s): AST, ALT, ALKPHOS, BILITOT, PROT, ALBUMIN in the last 168 hours. No results for input(s): LIPASE, AMYLASE in the last 168 hours. No results for input(s): AMMONIA in the last 168 hours. Coagulation Profile: No results for input(s): INR, PROTIME in the last 168 hours. Cardiac Enzymes: No results for input(s): CKTOTAL, CKMB, CKMBINDEX, TROPONINI in the last 168 hours. BNP (last 3 results) No results for input(s): PROBNP in the last 8760 hours. HbA1C: No results for input(s): HGBA1C in the last 72 hours. CBG: No results for input(s): GLUCAP in the last 168 hours. Lipid Profile: No results for input(s): CHOL, HDL, LDLCALC, TRIG, CHOLHDL, LDLDIRECT in the last 72 hours. Thyroid Function Tests: No results for input(s): TSH, T4TOTAL, FREET4, T3FREE, THYROIDAB in the last 72 hours. Anemia Panel: No results for input(s): VITAMINB12, FOLATE, FERRITIN, TIBC, IRON, RETICCTPCT in the last 72 hours. Sepsis Labs: No results for input(s): PROCALCITON, LATICACIDVEN in the last 168 hours.  Recent Results (from the past 240 hour(s))  SARS Coronavirus 2 [facility] order, Performed in [place] hospital lab)     Status: None   Collection Time: 10/05/18  7:40 AM  Result Value Ref Range Status   SARS Coronavirus 2 NEGATIVE NEGATIVE Final    Comment: (NOTE) If result is NEGATIVE SARS-CoV-2 target nucleic acids are NOT DETECTED. The SARS-CoV-2 RNA is  generally detectable in upper and lower  respiratory specimens during the acute phase of infection. The lowest  concentration of SARS-CoV-2 viral copies this assay can detect is 250  copies / mL. A negative result does not preclude SARS-CoV-2 infection  and should not be used as the sole basis for treatment or other  patient management decisions.  A negative result may occur with  improper specimen collection / handling, submission of specimen other  than nasopharyngeal swab, presence of viral mutation(s) within the  areas targeted by this assay, and inadequate number of viral copies  (<250 copies / mL). A negative result must be combined with clinical  observations, patient history, and epidemiological information. If result is POSITIVE SARS-CoV-2 target nucleic acids are DETECTED. The SARS-CoV-2 RNA is generally detectable in upper and lower  respiratory specimens dur ing the acute phase of infection.  Positive  results are indicative of active infection with SARS-CoV-2.  Clinical  correlation with patient history and other diagnostic information is  necessary to determine patient infection status.  Positive results do  not rule out bacterial infection or co-infection with other viruses.  If result is PRESUMPTIVE POSTIVE SARS-CoV-2 nucleic acids MAY BE PRESENT.   A presumptive positive result was obtained on the submitted specimen  and confirmed on repeat testing.  While 2019 novel coronavirus  (SARS-CoV-2) nucleic acids may be present in the submitted sample  additional confirmatory testing may be necessary for epidemiological  and / or clinical management purposes  to differentiate between  SARS-CoV-2 and other Sarbecovirus currently known to infect humans.  If clinically indicated additional testing with an alternate test  methodology [phone]) is advised. The SARS-CoV-2 RNA is generally  detectable in upper and lower respiratory sp ecimens during the acute  phase of  infection. The expected result is Negative. Fact Sheet for Patients:  StrictlyIdeas.no Fact Sheet for Healthcare Providers: BankingDealers.co.za This test is not yet approved or cleared by the [place] FDA and has been authorized for detection and/or diagnosis of SARS-CoV-2 by FDA under an Emergency Use Authorization (EUA).  This EUA will remain in effect (meaning this test can be used) for the duration of the COVID-19 declaration under Section 564(b)(1) of the Act, 21 U.S.C. section 360bbb-3(b)(1), unless the authorization is terminated or revoked sooner. Performed at [facility], [place] [address] [place], [place] [address]          Radiology Studies: Mr Lumbar Spine W Wo Contrast  Result Date: 10/05/2018 CLINICAL DATA:  Right hip pain radiating into groin. No known injury. EXAM: MRI LUMBAR SPINE WITHOUT AND WITH CONTRAST TECHNIQUE: Multiplanar and multiecho pulse sequences of the lumbar spine were obtained without and with intravenous contrast. CONTRAST:  7 cc Gadavist COMPARISON:  Lumbar spine radiographs 10/05/2018. CT lumbar spine, abdomen and pelvis 03/31/2018 FINDINGS: Segmentation: Conventional anatomy assumed, with the last open disc space designated L5-S1.Concordant with previous imaging. Alignment: Mild straightening and mild convex left scoliosis, stable. Vertebrae: No worrisome osseous lesion, acute fracture or pars defect. There are chronic endplate degenerative changes at L2-3, L4-5 and L5-S1. No abnormal marrow enhancement. The lumbar pedicles are somewhat short on a congenital basis.The visualized sacroiliac joints appear unremarkable. Conus medullaris: Extends to the L2 level and appears normal. No abnormal intradural enhancement. Paraspinal and other soft tissues: No significant paraspinal findings. The urinary bladder is moderately distended, similar to previous studies. There are small left renal cysts.  Disc levels: No significant disc space findings from T11-12 through L1-2. L2-3: Loss of disc height with annular disc bulging and endplate osteophytes. Mild facet and ligamentous hypertrophy. There is mild narrowing of the lateral recesses. The foramina are patent. L3-4: Disc height is relatively preserved. Annular disc bulging, facet and ligamentous hypertrophy superimposed on congenital factors contribute to moderate multifactorial spinal stenosis with mild narrowing of the lateral recesses and foramina bilaterally. L4-5: Chronic degenerative disc disease with annular disc bulging and endplate osteophytes asymmetric to the right. Facet hypertrophy is also asymmetric to the right and contributes to moderate right foraminal narrowing. There is mild narrowing of the lateral recesses, left greater than right. L5-S1: Remote laminectomy defect on the left. Chronic loss of disc height with endplate osteophytes asymmetric to the left and bilateral facet hypertrophy. Mild left foraminal narrowing appears stable. IMPRESSION: 1. No acute findings or clear explanation for the patient's symptoms. 2. Multilevel spondylosis, similar to previous CTs from 03/31/2018. 3. Moderate multifactorial spinal stenosis at L3-4 with mild narrowing of the lateral recesses and foramina. 4. Chronic right foraminal narrowing at L4-5 with potential for chronic right L4 nerve root encroachment. 5. Postsurgical changes on the left at L5-S1. Electronically Signed  By: Richardean Sale M.D.   On: 10/05/2018 19:36        Scheduled Meds:  enoxaparin (LOVENOX) injection  40 mg Subcutaneous Q24H   levothyroxine  125 mcg Oral QAC breakfast   pantoprazole  80 mg Oral Daily   predniSONE  50 mg Oral Daily   Continuous Infusions:   LOS: 2 days    Time spent: 35 minutes    Irine Seal, MD [facility]  If 7PM-7AM, please contact night-coverage [email or web address] 10/07/2018, 11:35 AM

## 2018-10-08 DIAGNOSIS — E669 Obesity, unspecified: Secondary | ICD-10-CM

## 2018-10-08 DIAGNOSIS — E1169 Type 2 diabetes mellitus with other specified complication: Secondary | ICD-10-CM

## 2018-10-08 LAB — BASIC METABOLIC PANEL
Anion gap: 13 (ref 5–15)
BUN: 37 mg/dL — ABNORMAL HIGH (ref 8–23)
CO2: 22 mmol/L (ref 22–32)
Calcium: 9.2 mg/dL (ref 8.9–10.3)
Chloride: 100 mmol/L (ref 98–111)
Creatinine, Ser: 1.12 mg/dL — ABNORMAL HIGH (ref 0.44–1.00)
GFR calc Af Amer: 49 mL/min — ABNORMAL LOW (ref >60)
GFR calc non Af Amer: 42 mL/min — ABNORMAL LOW (ref >60)
Glucose, Bld: 201 mg/dL — ABNORMAL HIGH (ref 70–99)
Potassium: 4.5 mmol/L (ref 3.5–5.1)
Sodium: 135 mmol/L (ref 135–145)

## 2018-10-08 MED ORDER — HYDROCODONE-ACETAMINOPHEN 5-325 MG PO TABS: 1.0000 | ORAL_TABLET | ORAL | 0 refills | Status: DC | PRN

## 2018-10-08 MED ORDER — ENOXAPARIN SODIUM 30 MG/0.3ML ~~LOC~~ SOLN
30.0000 mg | SUBCUTANEOUS | Status: DC
  Administered 2018-10-08: 30 mg via SUBCUTANEOUS
  Filled 2018-10-08: qty 0.3

## 2018-10-08 MED ORDER — PREDNISONE 10 MG PO TABS: 10.0000 mg | ORAL_TABLET | Freq: Every day | ORAL | 0 refills | Status: DC

## 2018-10-08 NOTE — Progress Notes (Signed)
Physical Therapy Treatment Patient Details Name: Shelia Black MRN: [id number] DOB: [birth year] Today's Date: 10/08/2018    History of Present Illness Pt is a 85 y.o. female with PMH significant of HOH (even with  hearing aides), DDD, COPD, hypothyroidism, pulmonary fibrosis, spinal stenosis, diverticular disease and diabetes who has had chronic low back pain has had multiple injections in the past.  Presented to ED on 8/31 with persistent low back pain which is getting worse with any activities, unable to stand. MRI of the L-spine with no acute findings.    PT Comments    Pt reports pain more tolerable today and able to ambulate short distance.  Plan is for SNF likely today.  Follow Up Recommendations  SNF     Equipment Recommendations  Rolling walker with 5" wheels    Recommendations for Other Services       Precautions / Restrictions Precautions Precautions: Back;Fall Precaution Booklet Issued: No Precaution Comments: verbally reviewed Restrictions Weight Bearing Restrictions: No    Mobility  Bed Mobility Overal bed mobility: Needs Assistance Bed Mobility: Supine to Sit     Supine to sit: Min assist     General bed mobility comments: verbally cued pt for log roll technique however pt performed by bringing LEs over EOB first, assist for trunk upright  Transfers Overall transfer level: Needs assistance Equipment used: Rolling walker (2 wheeled) Transfers: Sit to/from Stand Sit to Stand: Min assist         General transfer comment: initially min assist to steady, cues for positioning and hand placement  Ambulation/Gait Ambulation/Gait assistance: Min guard Gait Distance (Feet): 60 Feet Assistive device: Rolling walker (2 wheeled) Gait Pattern/deviations: Step-through pattern;Decreased stride length;Trunk flexed     General Gait Details: verbal cues for posture however pt tends to ambulate in trunk flexion with RW slightly forward; distance to  tolerance   Stairs             Wheelchair Mobility    Modified Rankin (Stroke Patients Only)       Balance                                            Cognition Arousal/Alertness: Awake/alert Behavior During Therapy: WFL for tasks assessed/performed Overall Cognitive Status: Within Functional Limits for tasks assessed                                 General Comments: very HOH      Exercises Other Exercises Other Exercises: AROM bil shoulders to 90    General Comments        Pertinent Vitals/Pain Pain Assessment: Faces Faces Pain Scale: Hurts little more Pain Location: neck Pain Descriptors / Indicators: Grimacing;Sore Pain Intervention(s): Repositioned;Monitored during session    Home Living                      Prior Function            PT Goals (current goals can now be found in the care plan section) Progress towards PT goals: Progressing toward goals    Frequency    Min 2X/week      PT Plan Current plan remains appropriate    Co-evaluation              AM-PAC PT "6 Clicks" Mobility  Outcome Measure  Help needed turning from your back to your side while in a flat bed without using bedrails?: A Lot Help needed moving from lying on your back to sitting on the side of a flat bed without using bedrails?: A Little Help needed moving to and from a bed to a chair (including a wheelchair)?: A Little Help needed standing up from a chair using your arms (e.g., wheelchair or bedside chair)?: A Little Help needed to walk in hospital room?: A Little Help needed climbing 3-5 steps with a railing? : A Lot 6 Click Score: 16    End of Session Equipment Utilized During Treatment: Gait belt Activity Tolerance: Patient tolerated treatment well Patient left: with call bell/phone within reach;with chair alarm set;with nursing/sitter in room(with nurse tech in bathroom)   PT Visit Diagnosis: Difficulty in  walking, not elsewhere classified (R26.2)     Time: 3818-2993 PT Time Calculation (min) (ACUTE ONLY): 20 min  Charges:  $Gait Training: 8-22 mins                     Carmelia Bake, PT, DPT Acute Rehabilitation Services Office: [phone] Pager: [place] 10/08/2018, 12:15 PM

## 2018-10-08 NOTE — Progress Notes (Signed)
Report called to [facility] at [employer]. Patient stable for discharge. Daughter at bedside and aware of discharge plan. [facility] here to transport patient now.   Sean Malinowski, Fraser Din  10/08/2018 2:26 PM

## 2018-10-08 NOTE — TOC Progression Note (Signed)
Transition of Care [facility]) - Progression Note    Patient Details  Name: Shelia Black MRN: [id number] Date of Birth: [birth year]  Transition of Care [facility]) CM/SW Contact  Purcell Mouton, RN Phone Number: 10/08/2018, 11:44 AM  Clinical Narrative:    Pt discharging to [facility], daughter [name] was called and made aware.    Expected Discharge Plan: [place] Barriers to Discharge: Continued Medical Work up  Expected Discharge Plan and Services Expected Discharge Plan: [place] In-house Referral: Clinical Social Work   Post Acute Care Choice: [place] Living arrangements for the past 2 months: Single Family Home Expected Discharge Date: 10/08/18                                     Social Determinants of Health (SDOH) Interventions    Readmission Risk Interventions No flowsheet data found.

## 2018-10-08 NOTE — Discharge Summary (Signed)
Physician Discharge Summary  Shelia Black OQH:[id number] DOB: [birth year] DOA: 10/04/2018  PCP: [name], PA-C  Admit date: 10/04/2018 Discharge date: 10/08/2018  Time spent: 60 minutes  Recommendations for Outpatient Follow-up:  1. Patient will be discharged to skilled nursing facility.  Follow-up with MD at the skilled nursing facility. 2. Follow-up with orthopedics as previously scheduled 19 2020.   Discharge Diagnoses:  Principal Problem:   Back pain, lumbosacral Active Problems:   Diabetes mellitus type 2 in obese (HCC)   CKD (chronic kidney disease) stage 3, GFR 30-59 ml/min (HCC)   GERD (gastroesophageal reflux disease)   Hypothyroidism   Depression   Hypoxia   Back pain   Discharge Condition: Stable and improved.  Diet recommendation: Heart healthy  Filed Weights   10/04/18 2310 10/05/18 0638  Weight: 65.8 kg 66.7 kg    History of present illness:  Per Dr. Crecencio Mc is a 85 y.o. female with medical history significant of degenerative disc disease, COPD, GERD, hypothyroidism, pulmonary fibrosis, diverticular disease and diabetes who has had chronic low back pain has had multiple injections in the past.  She had also had spinal stenosis for which she has been seen by orthopedics.  Patient came in with persistent low back pain which is getting worse with any activities.  From degenerative disc disease of the lumbar spine.  Patient has received multiple medications for pain in the ER but pain is persistent.  She is unable to stand on her feet.  Patient is therefore being admitted to the hospital for pain management.  She denied any fall or any new injury..  ED Course: Temperature 98 for blood pressure 137/50 pulse 68 respiratory 22 oxygen sat 80% on room air.  White count is 12.7 hemoglobin 13.6 and the rest of chemistry and CBC is within normal glucose 173.  X-ray of the lumbar spine showed multiple degenerative disc disease.  No acute  fracture  Hospital Course:  1 acute on chronic lumbar back pain Patient noted have previously been diagnosed with degenerative disc disease with outpatient evaluation with injections in the past.  Patient noted to have been diagnosed with sacroiliitis back in 2019 on record review.  Plain films of the lumbar spine and right hip and pelvis reveal degenerative changes with no acute changes noted.  MRI of the L-spine with no acute findings.  Patient was receiving IV pain medication for intractable pain.  Patient was also placed on Vicodin as needed.  Patient started on a prednisone taper.  Patient improved clinically.  Pain was better controlled.  Patient was seen by PT who had recommended skilled nursing facility.  Patient be discharged to a skilled nursing facility. Patient scheduled to follow-up with orthopedics in the outpatient setting scheduled for 10/15/2018.    Patient will be discharged in stable and improved condition.   2.  Hypothyroidism Patient maintained on home regimen of Synthroid.  Outpatient follow-up.    3.  Chronic kidney disease stage III Remained stable throughout the hospitalization.   4.  History of pulmonary fibrosis Stable.  Outpatient follow-up.   5.  Gastroesophageal reflux disease Patient maintained on a PPI.  Outpatient follow-up.    Procedures:  Plain films of the L-spine 10/05/2018  MRI L-spine 10/05/2018  Plain films of the right hip and pelvis 10/05/2018    Consultations:  None  Discharge Exam: Vitals:   10/07/18 2042 10/08/18 0441  BP: 113/66 (!) 111/47  Pulse: (!) 58 (!) 58  Resp: 18 16  Temp: 99  F (37.2 C) 97.9 F (36.6 C)  SpO2: 96% 95%    General: NAD Cardiovascular: RRR Respiratory: CTAB  Discharge Instructions   Discharge Instructions    Diet - low sodium heart healthy   Complete by: As directed    Increase activity slowly   Complete by: As directed      Allergies as of 10/08/2018      Reactions    Oxycodone-acetaminophen Nausea Only   Penicillins Hives, Rash   Has patient had a PCN reaction causing immediate rash, facial/tongue/throat swelling, SOB or lightheadedness with hypotension: no Has patient had a PCN reaction causing severe rash involving mucus membranes or skin necrosis: unknown Has patient had a PCN reaction that required hospitalization : unknown Has patient had a PCN reaction occurring within the last 10 years: no If all of the above answers are "NO", then may proceed with Cephalosporin use.   Codeine    Nausea and vomiting   Percocet [oxycodone-acetaminophen] Nausea And Vomiting   Tramadol Nausea And Vomiting      Medication List    TAKE these medications   acetaminophen 500 MG tablet Commonly known as: TYLENOL Take 500-1,000 mg by mouth every 6 (six) hours as needed for moderate pain or fever.   albuterol 108 (90 Base) MCG/ACT inhaler Commonly known as: VENTOLIN HFA Inhale 1 puff into the lungs every 6 (six) hours as needed for wheezing or shortness of breath.   ALIGN PO Take 1 tablet by mouth daily.   ammonium lactate 12 % lotion Commonly known as: LAC-HYDRIN Apply 1 application topically daily as needed for dry skin.   CALCIUM 600 PO Take 1 tablet by mouth 2 (two) times daily.   chlorpheniramine-HYDROcodone 10-8 MG/5ML Suer Commonly known as: TUSSIONEX Take 5 mLs by mouth every 12 (twelve) hours as needed for cough.   diphenoxylate-atropine 2.5-0.025 MG tablet Commonly known as: LOMOTIL Take 2 tablets by mouth 4 (four) times daily as needed for diarrhea or loose stools. diarrhea   HYDROcodone-acetaminophen 5-325 MG tablet Commonly known as: NORCO/VICODIN Take 1-2 tablets by mouth every 4 (four) hours as needed for moderate pain.   levothyroxine 125 MCG tablet Commonly known as: SYNTHROID Take 125 mcg by mouth daily before breakfast.   Olopatadine HCl 0.2 % Soln Apply 1 drop to eye daily as needed (allergies).   omeprazole 40 MG  capsule Commonly known as: PRILOSEC Take 1 capsule (40 mg total) by mouth daily.   predniSONE 10 MG tablet Commonly known as: DELTASONE Take 1-4 tablets (10-40 mg total) by mouth daily with breakfast. Take 4 tablets (40 mg) daily x5 days, then 3 tablets (30 mg) daily x5 days, then 2 tablets (58m) daily x5 days, then 1 tablet (170m daily x5 days then stop.   Systane 0.4-0.3 % Soln Generic drug: Polyethyl Glycol-Propyl Glycol Apply 1 drop to eye daily as needed (dry eyes).   vitamin C 1000 MG tablet Take 1,000 mg by mouth daily.   Vitamin D-3 125 MCG (5000 UT) Tabs Take 1 tablet by mouth daily.      Allergies  Allergen Reactions  . Oxycodone-Acetaminophen Nausea Only  . Penicillins Hives and Rash    Has patient had a PCN reaction causing immediate rash, facial/tongue/throat swelling, SOB or lightheadedness with hypotension: no Has patient had a PCN reaction causing severe rash involving mucus membranes or skin necrosis: unknown Has patient had a PCN reaction that required hospitalization : unknown Has patient had a PCN reaction occurring within the last 10 years: no  If all of the above answers are "NO", then may proceed with Cephalosporin use.   . Codeine     Nausea and vomiting  . Percocet [Oxycodone-Acetaminophen] Nausea And Vomiting  . Tramadol Nausea And Vomiting   Follow-up Information    MD at SNF Follow up.   Why: Follow-up with MD at SNF       orthopedics Follow up on 10/15/2018.   Why: Follow-up as scheduled with orthopedics on 10/15/2018.           The results of significant diagnostics from this hospitalization (including imaging, microbiology, ancillary and laboratory) are listed below for reference.    Significant Diagnostic Studies: Dg Lumbar Spine Complete  Result Date: 10/05/2018 CLINICAL DATA:  Lumbosacral back pain. EXAM: LUMBAR SPINE - COMPLETE 4+ VIEW COMPARISON:  Lumbar spine CT 03/31/2018 FINDINGS: The alignment is maintained, similar  straightening to prior exam. Vertebral body heights are normal. There is no listhesis. Disc space narrowing and endplate spurring at V8-L3, L5-S1, and L2-L3. Multilevel facet hypertrophy. The posterior elements are intact. No fracture. Sacroiliac joints are symmetric and normal. Aortic atherosclerosis. IMPRESSION: Multilevel degenerative disc disease and facet arthropathy throughout the lumbar spine. No acute osseous abnormality. Electronically Signed   By: Keith Rake M.D.   On: 10/05/2018 02:36   Mr Lumbar Spine W Wo Contrast  Result Date: 10/05/2018 CLINICAL DATA:  Right hip pain radiating into groin. No known injury. EXAM: MRI LUMBAR SPINE WITHOUT AND WITH CONTRAST TECHNIQUE: Multiplanar and multiecho pulse sequences of the lumbar spine were obtained without and with intravenous contrast. CONTRAST:  7 cc Gadavist COMPARISON:  Lumbar spine radiographs 10/05/2018. CT lumbar spine, abdomen and pelvis 03/31/2018 FINDINGS: Segmentation: Conventional anatomy assumed, with the last open disc space designated L5-S1.Concordant with previous imaging. Alignment: Mild straightening and mild convex left scoliosis, stable. Vertebrae: No worrisome osseous lesion, acute fracture or pars defect. There are chronic endplate degenerative changes at L2-3, L4-5 and L5-S1. No abnormal marrow enhancement. The lumbar pedicles are somewhat short on a congenital basis.The visualized sacroiliac joints appear unremarkable. Conus medullaris: Extends to the L2 level and appears normal. No abnormal intradural enhancement. Paraspinal and other soft tissues: No significant paraspinal findings. The urinary bladder is moderately distended, similar to previous studies. There are small left renal cysts. Disc levels: No significant disc space findings from T11-12 through L1-2. L2-3: Loss of disc height with annular disc bulging and endplate osteophytes. Mild facet and ligamentous hypertrophy. There is mild narrowing of the lateral recesses.  The foramina are patent. L3-4: Disc height is relatively preserved. Annular disc bulging, facet and ligamentous hypertrophy superimposed on congenital factors contribute to moderate multifactorial spinal stenosis with mild narrowing of the lateral recesses and foramina bilaterally. L4-5: Chronic degenerative disc disease with annular disc bulging and endplate osteophytes asymmetric to the right. Facet hypertrophy is also asymmetric to the right and contributes to moderate right foraminal narrowing. There is mild narrowing of the lateral recesses, left greater than right. L5-S1: Remote laminectomy defect on the left. Chronic loss of disc height with endplate osteophytes asymmetric to the left and bilateral facet hypertrophy. Mild left foraminal narrowing appears stable. IMPRESSION: 1. No acute findings or clear explanation for the patient's symptoms. 2. Multilevel spondylosis, similar to previous CTs from 03/31/2018. 3. Moderate multifactorial spinal stenosis at L3-4 with mild narrowing of the lateral recesses and foramina. 4. Chronic right foraminal narrowing at L4-5 with potential for chronic right L4 nerve root encroachment. 5. Postsurgical changes on the left at L5-S1. Electronically Signed  By: Richardean Sale M.D.   On: 10/05/2018 19:36   Dg Hip Unilat W Or Wo Pelvis 2-3 Views Right  Result Date: 10/05/2018 CLINICAL DATA:  Right hip pain. EXAM: DG HIP (WITH OR WITHOUT PELVIS) 2-3V RIGHT COMPARISON:  None. FINDINGS: The cortical margins of the bony pelvis and right hip are intact. No fracture. Pubic symphysis and sacroiliac joints are congruent. Both femoral heads are well-seated in the respective acetabula. Age related acetabular spurring bilaterally. IMPRESSION: Mild degenerative change for age.  No acute osseous abnormality. Electronically Signed   By: Keith Rake M.D.   On: 10/05/2018 02:38    Microbiology: Recent Results (from the past 240 hour(s))  SARS Coronavirus 2 [facility] order,  Performed in [place] hospital lab)     Status: None   Collection Time: 10/05/18  7:40 AM  Result Value Ref Range Status   SARS Coronavirus 2 NEGATIVE NEGATIVE Final    Comment: (NOTE) If result is NEGATIVE SARS-CoV-2 target nucleic acids are NOT DETECTED. The SARS-CoV-2 RNA is generally detectable in upper and lower  respiratory specimens during the acute phase of infection. The lowest  concentration of SARS-CoV-2 viral copies this assay can detect is 250  copies / mL. A negative result does not preclude SARS-CoV-2 infection  and should not be used as the sole basis for treatment or other  patient management decisions.  A negative result may occur with  improper specimen collection / handling, submission of specimen other  than nasopharyngeal swab, presence of viral mutation(s) within the  areas targeted by this assay, and inadequate number of viral copies  (<250 copies / mL). A negative result must be combined with clinical  observations, patient history, and epidemiological information. If result is POSITIVE SARS-CoV-2 target nucleic acids are DETECTED. The SARS-CoV-2 RNA is generally detectable in upper and lower  respiratory specimens dur ing the acute phase of infection.  Positive  results are indicative of active infection with SARS-CoV-2.  Clinical  correlation with patient history and other diagnostic information is  necessary to determine patient infection status.  Positive results do  not rule out bacterial infection or co-infection with other viruses. If result is PRESUMPTIVE POSTIVE SARS-CoV-2 nucleic acids MAY BE PRESENT.   A presumptive positive result was obtained on the submitted specimen  and confirmed on repeat testing.  While 2019 novel coronavirus  (SARS-CoV-2) nucleic acids may be present in the submitted sample  additional confirmatory testing may be necessary for epidemiological  and / or clinical management purposes  to differentiate between  SARS-CoV-2  and other Sarbecovirus currently known to infect humans.  If clinically indicated additional testing with an alternate test  methodology [phone]) is advised. The SARS-CoV-2 RNA is generally  detectable in upper and lower respiratory sp ecimens during the acute  phase of infection. The expected result is Negative. Fact Sheet for Patients:  StrictlyIdeas.no Fact Sheet for Healthcare Providers: BankingDealers.co.za This test is not yet approved or cleared by the [place] FDA and has been authorized for detection and/or diagnosis of SARS-CoV-2 by FDA under an Emergency Use Authorization (EUA).  This EUA will remain in effect (meaning this test can be used) for the duration of the COVID-19 declaration under Section 564(b)(1) of the Act, 21 U.S.C. section 360bbb-3(b)(1), unless the authorization is terminated or revoked sooner. Performed at [facility], [place] [address], [place], [place] [address]      Labs: Basic Metabolic Panel: Recent Labs  Lab 10/04/18 [phone] 10/06/18 0518 10/08/18 0503  NA 135 135 135  K 4.4 4.5 4.5  CL 102 99 100  CO2 23 26 22   GLUCOSE 173* 154* 201*  BUN 21 15 37*  CREATININE 0.97 0.94 1.12*  CALCIUM 9.1 9.1 9.2   Liver Function Tests: No results for input(s): AST, ALT, ALKPHOS, BILITOT, PROT, ALBUMIN in the last 168 hours. No results for input(s): LIPASE, AMYLASE in the last 168 hours. No results for input(s): AMMONIA in the last 168 hours. CBC: Recent Labs  Lab 10/04/18 2333 10/06/18 0518 10/07/18 0441 10/07/18 1653  WBC 12.7* 12.6* 9.5 12.3*  NEUTROABS  --   --   --  10.4*  HGB 13.6 14.2 13.0 12.9  HCT 41.3 43.5 39.9 39.5  MCV 100.2* 99.8 100.3* 100.8*  PLT 235 210 216 243   Cardiac Enzymes: No results for input(s): CKTOTAL, CKMB, CKMBINDEX, TROPONINI in the last 168 hours. BNP: BNP (last 3 results) No results for input(s): BNP in the last 8760 hours.  ProBNP (last 3  results) No results for input(s): PROBNP in the last 8760 hours.  CBG: No results for input(s): GLUCAP in the last 168 hours.     Signed:  Irine Seal MD.  [facility] 10/08/2018, 10:46 AM

## 2018-10-08 NOTE — Care Management Important Message (Signed)
Important Message  Patient Details IM Letter given to Gabriel Earing RN to present to the Patient Name: Shelia Black MRN: [id number] Date of Birth: 1921/10/16   Medicare Important Message Given:  Yes     Kerin Salen 10/08/2018, 11:10 AM

## 2018-10-08 NOTE — Progress Notes (Signed)
Occupational Therapy Treatment Patient Details Name: Shelia BreedingDorothy Black MRN: [id number] DOB: 10/12/1921 Today's Date: 10/08/2018    History of present illness Pt is a 85 y.o. female with PMH significant of HOH (even with  hearing aides), DDD, COPD, hypothyroidism, pulmonary fibrosis, spinal stenosis, diverticular disease and diabetes who has had chronic low back pain has had multiple injections in the past.  Presented to ED on 8/31 with persistent low back pain which is getting worse with any activities, unable to stand. Denies falls. MRI of spine pending.   OT comments  Pt up in chair. Performed grooming at set up and level AROM.  Pt had completed bathing/dressing earlier.   Follow Up Recommendations  SNF    Equipment Recommendations    3:1 commode   Recommendations for Other Services      Precautions / Restrictions Precautions Precautions: Back;Fall Precaution Booklet Issued: No Precaution Comments: verbally reviewed Restrictions Weight Bearing Restrictions: No       Mobility Bed Mobility                  Transfers                      Balance                                           ADL either performed or assessed with clinical judgement   ADL       Grooming: Set up;Oral care;Brushing hair;Sitting                                 General ADL Comments: pt up in chair.  Had completed bathing/dressing earlier     Vision       Perception     Praxis      Cognition Arousal/Alertness: Awake/alert Behavior During Therapy: WFL for tasks assessed/performed Overall Cognitive Status: No family/caregiver present to determine baseline cognitive functioning                                 General Comments: very HOH        Exercises Exercises: Other exercises Other Exercises Other Exercises: AROM bil shoulders to 90   Shoulder Instructions       General Comments      Pertinent Vitals/  Pain       Pain Assessment: Faces Faces Pain Scale: Hurts even more Pain Location: neck Pain Descriptors / Indicators: Grimacing Pain Intervention(s): Limited activity within patient's tolerance;Monitored during session;Repositioned  Home Living                                          Prior Functioning/Environment              Frequency  Min 2X/week        Progress Toward Goals  OT Goals(current goals can now be found in the care plan section)  Progress towards OT goals: Progressing toward goals     Plan      Co-evaluation                 AM-PAC OT "6 Clicks" Daily Activity  Outcome Measure   Help from another person eating meals?: A Little Help from another person taking care of personal grooming?: A Little Help from another person toileting, which includes using toliet, bedpan, or urinal?: Total Help from another person bathing (including washing, rinsing, drying)?: A Lot Help from another person to put on and taking off regular upper body clothing?: A Lot Help from another person to put on and taking off regular lower body clothing?: Total 6 Click Score: 12    End of Session    OT Visit Diagnosis: Unsteadiness on feet (R26.81);Other abnormalities of gait and mobility (R26.89);Muscle weakness (generalized) (M62.81);Other symptoms and signs involving cognitive function;Pain   Activity Tolerance Patient tolerated treatment well   Patient Left in chair;with call bell/phone within reach   Nurse Communication          Time: 2956-2130 OT Time Calculation (min): 17 min  Charges: OT General Charges $OT Visit: 1 Visit OT Treatments $Self Care/Home Management : 8-22 mins  Lesle Chris, OTR/L Acute Rehabilitation Services [phone] [place] pager [phone] office 10/08/2018   [place] 10/08/2018, 11:29 AM

## 2019-01-11 ENCOUNTER — Telehealth: Payer: Self-pay | Admitting: Cardiovascular Disease

## 2019-01-11 NOTE — Telephone Encounter (Signed)
Let pt daughter know that she is approved to accompany pt.

## 2019-01-11 NOTE — Telephone Encounter (Signed)
Daughter would like to come to patient's appointment on 01/14/19. Due to patient being in a wheelchair and hard of hearing.

## 2019-01-14 ENCOUNTER — Encounter: Payer: Self-pay | Admitting: Cardiovascular Disease

## 2019-01-14 ENCOUNTER — Encounter (INDEPENDENT_AMBULATORY_CARE_PROVIDER_SITE_OTHER): Payer: Self-pay

## 2019-01-14 ENCOUNTER — Other Ambulatory Visit: Payer: Self-pay

## 2019-01-14 ENCOUNTER — Ambulatory Visit (INDEPENDENT_AMBULATORY_CARE_PROVIDER_SITE_OTHER): Payer: Medicare Other | Admitting: Cardiovascular Disease

## 2019-01-14 DIAGNOSIS — R0789 Other chest pain: Secondary | ICD-10-CM | POA: Diagnosis not present

## 2019-01-14 NOTE — Progress Notes (Signed)
01/14/2019 Shelia Black   04/02/1921  [id number]  Primary Physician [name], PA-C Primary Cardiologist: Lorretta Harp MD Lupe Carney, [place]  HPI:  Shelia Black is a 85 y.o. thin and frail appearing widowed Caucasian female who is accompanied by her daughter Hilda Blades who also is a patient of mine.  She has no prior cardiac history.  Her only risk factors are diabetes on Metformin.  She is never had a heart attack or stroke.  Over the last month she developed some sharp fleeting chest pains which do not sound cardiac in nature.   Current Meds  Medication Sig  . acetaminophen (TYLENOL) 500 MG tablet Take 500-1,000 mg by mouth every 6 (six) hours as needed for moderate pain or fever.   Marland Kitchen albuterol (PROVENTIL HFA;VENTOLIN HFA) 108 (90 Base) MCG/ACT inhaler Inhale 1 puff into the lungs every 6 (six) hours as needed for wheezing or shortness of breath.  Marland Kitchen ammonium lactate (LAC-HYDRIN) 12 % lotion Apply 1 application topically daily as needed for dry skin.   . Ascorbic Acid (VITAMIN C) 1000 MG tablet Take 1,000 mg by mouth daily.  . Calcium Carbonate (CALCIUM 600 PO) Take 1 tablet by mouth 2 (two) times daily.  . chlorpheniramine-HYDROcodone (TUSSIONEX) 10-8 MG/5ML SUER Take 5 mLs by mouth every 12 (twelve) hours as needed for cough.   . Cholecalciferol (VITAMIN D-3) 5000 units TABS Take 1 tablet by mouth daily.  . diphenoxylate-atropine (LOMOTIL) 2.5-0.025 MG tablet Take 2 tablets by mouth 4 (four) times daily as needed for diarrhea or loose stools. diarrhea  . levothyroxine (SYNTHROID, LEVOTHROID) 125 MCG tablet Take 125 mcg by mouth daily before breakfast.  . omeprazole (PRILOSEC) 40 MG capsule Take 1 capsule (40 mg total) by mouth daily.  Vladimir Faster Glycol-Propyl Glycol (SYSTANE) 0.4-0.3 % SOLN Apply 1 drop to eye daily as needed (dry eyes).  . Probiotic Product (ALIGN PO) Take 1 tablet by mouth daily.     Allergies  Allergen Reactions  .  Oxycodone-Acetaminophen Nausea Only  . Penicillins Hives and Rash    Has patient had a PCN reaction causing immediate rash, facial/tongue/throat swelling, SOB or lightheadedness with hypotension: no Has patient had a PCN reaction causing severe rash involving mucus membranes or skin necrosis: unknown Has patient had a PCN reaction that required hospitalization : unknown Has patient had a PCN reaction occurring within the last 10 years: no If all of the above answers are "NO", then may proceed with Cephalosporin use.   . Codeine     Nausea and vomiting  . Percocet [Oxycodone-Acetaminophen] Nausea And Vomiting  . Tramadol Nausea And Vomiting    Social History   Socioeconomic History  . Marital status: Widowed    Spouse name: Not on file  . Number of children: 2  . Years of education: Not on file  . Highest education level: Not on file  Occupational History  . Occupation: retired  Tobacco Use  . Smoking status: Never Smoker  . Smokeless tobacco: Never Used  Substance and Sexual Activity  . Alcohol use: No  . Drug use: No  . Sexual activity: Not Currently    Birth control/protection: None  Other Topics Concern  . Not on file  Social History Narrative  . Not on file   Social Determinants of Health   Financial Resource Strain:   . Difficulty of Paying Living Expenses: Not on file  Food Insecurity:   . Worried About Charity fundraiser in the Last  Year: Not on file  . Ran Out of Food in the Last Year: Not on file  Transportation Needs:   . Lack of Transportation (Medical): Not on file  . Lack of Transportation (Non-Medical): Not on file  Physical Activity:   . Days of Exercise per Week: Not on file  . Minutes of Exercise per Session: Not on file  Stress:   . Feeling of Stress : Not on file  Social Connections:   . Frequency of Communication with Friends and Family: Not on file  . Frequency of Social Gatherings with Friends and Family: Not on file  . Attends Religious  Services: Not on file  . Active Member of Clubs or Organizations: Not on file  . Attends Archivist Meetings: Not on file  . Marital Status: Not on file  Intimate Partner Violence:   . Fear of Current or Ex-Partner: Not on file  . Emotionally Abused: Not on file  . Physically Abused: Not on file  . Sexually Abused: Not on file     Review of Systems: General: negative for chills, fever, night sweats or weight changes.  Cardiovascular: negative for chest pain, dyspnea on exertion, edema, orthopnea, palpitations, paroxysmal nocturnal dyspnea or shortness of breath Dermatological: negative for rash Respiratory: negative for cough or wheezing Urologic: negative for hematuria Abdominal: negative for nausea, vomiting, diarrhea, bright red blood per rectum, melena, or hematemesis Neurologic: negative for visual changes, syncope, or dizziness All other systems reviewed and are otherwise negative except as noted above.    Blood pressure 130/62, pulse 77, height 5' 4"  (1.626 m), weight 146 lb 3.2 oz (66.3 kg).  General appearance: alert and no distress Neck: no adenopathy, no carotid bruit, no JVD, supple, symmetrical, trachea midline and thyroid not enlarged, symmetric, no tenderness/mass/nodules Lungs: clear to auscultation bilaterally Heart: regular rate and rhythm, S1, S2 normal, no murmur, click, rub or gallop Extremities: extremities normal, atraumatic, no cyanosis or edema Pulses: 2+ and symmetric Skin: Skin color, texture, turgor normal. No rashes or lesions Neurologic: Alert and oriented X 3, normal strength and tone. Normal symmetric reflexes. Normal coordination and gait  EKG sinus rhythm at 77 with PACs and nonspecific ST and T wave changes.  I personally reviewed this EKG.  ASSESSMENT AND PLAN:   Atypical chest pain Ms.Mix is self-referred for evaluation of atypical chest pain.  She is accompanied by her daughter Oswaldo Conroy who I have seen in the past as  well.  Her only cardiac risk factor is diabetes on Metformin.  She is never had a heart attack or stroke.  She is never smoked.  Her family history is somewhat obscure.  Her last month she has noticed episodes of sharp chest pain that lasts for a second or 2 at a time and that occur every couple days.  These do not sound ischemically mediated.  She is lives with her daughter and is minimally ambulatory.  I do not think it is appropriate to begin a noninvasive evaluation at this time.  I discussed this with the patient and her daughter.  Likewise, I do not feel compelled to begin her on antianginal medications given the quality of her symptoms.      Lorretta Harp MD FACP,FACC,FAHA, [facility] 01/14/2019 2:14 PM

## 2019-01-14 NOTE — Assessment & Plan Note (Signed)
Shelia Black is self-referred for evaluation of atypical chest pain.  She is accompanied by her daughter Oswaldo Conroy who I have seen in the past as well.  Her only cardiac risk factor is diabetes on Metformin.  She is never had a heart attack or stroke.  She is never smoked.  Her family history is somewhat obscure.  Her last month she has noticed episodes of sharp chest pain that lasts for a second or 2 at a time and that occur every couple days.  These do not sound ischemically mediated.  She is lives with her daughter and is minimally ambulatory.  I do not think it is appropriate to begin a noninvasive evaluation at this time.  I discussed this with the patient and her daughter.  Likewise, I do not feel compelled to begin her on antianginal medications given the quality of her symptoms.

## 2019-01-14 NOTE — Patient Instructions (Signed)
Medication Instructions:  Your physician recommends that you continue on your current medications as directed. Please refer to the Current Medication list given to you today.  If you need a refill on your cardiac medications before your next appointment, please call your pharmacy.   Lab work: NONE  Testing/Procedures: NONE  Follow-Up: At [facility], you and your health needs are our priority.  As part of our continuing mission to provide you with exceptional heart care, we have created designated Provider Care Teams.  These Care Teams include your primary Cardiologist (physician) and Advanced Practice Providers (APPs -  Physician Assistants and Nurse Practitioners) who all work together to provide you with the care you need, when you need it. You may see Dr Gwenlyn Found or one of the following Advanced Practice Providers on your designated Care Team:    Kerin Ransom, PA-C  [place], [place]  Coletta Memos, [place]  Your physician wants you to follow-up in: 6 months

## 2019-03-27 IMAGING — CT CT CERVICAL SPINE W/O CM
3 of 4 series · 12 of 33 positions shown, 14 images · non-contrast
Comparison: None.

CLINICAL DATA: Severe neck pain for 3-4 days without fall or known
injury. Question arthritis.

EXAM:
CT CERVICAL SPINE WITHOUT CONTRAST
TECHNIQUE: Multidetector CT imaging of the cervical spine was performed without
intravenous contrast. Multiplanar CT image reconstructions were also
generated.

[Series 7: orthogonal bone · axial · 0.23mm/px · z∈[-180,-47]mm · 4 of 102 slices shown, 5 images]
[im 17/102  soft-tissue]
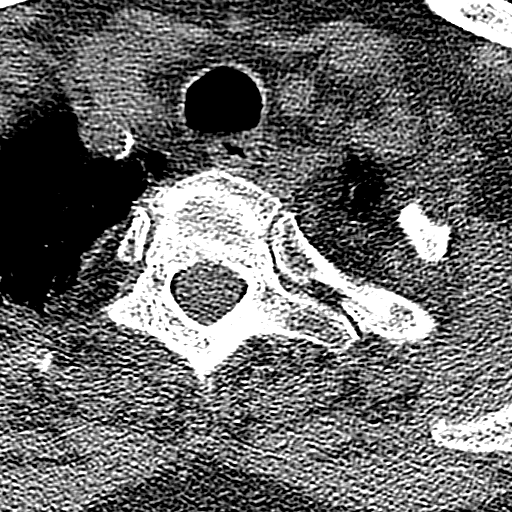
[im 17/102  bone]
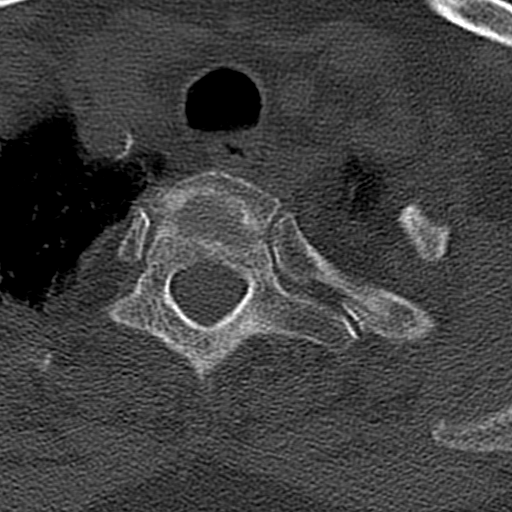
[im 34/102  bone]
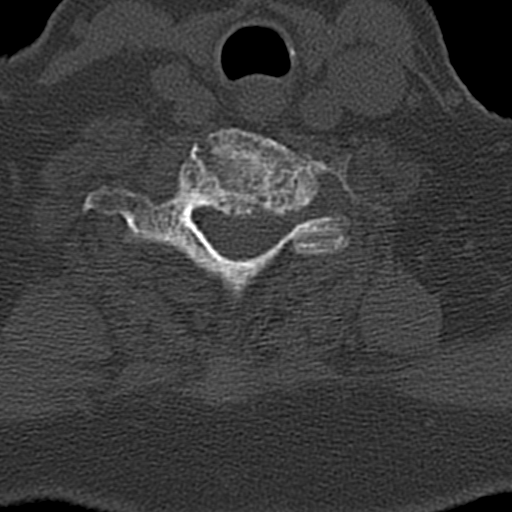
[im 68/102  bone]
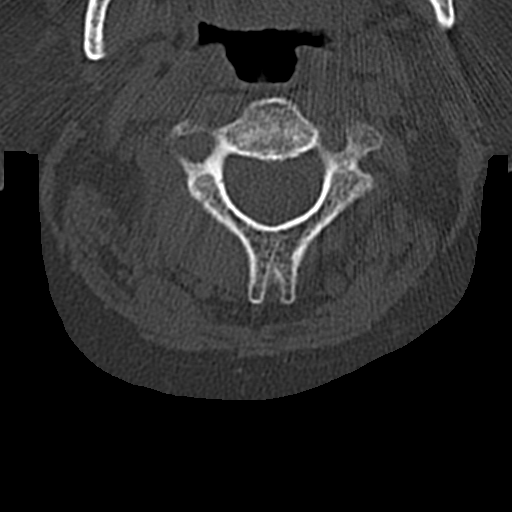
[im 85/102  bone]
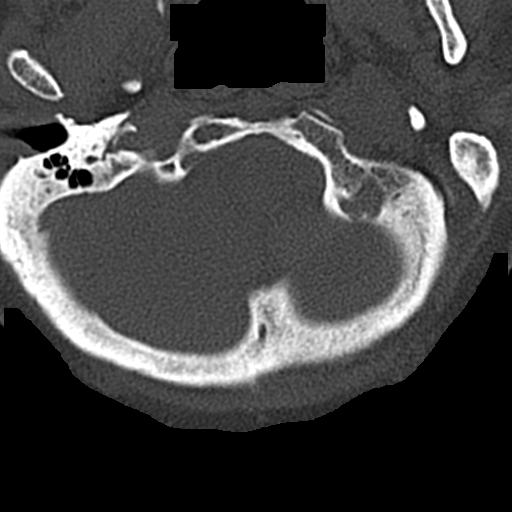

[Series 8: coronal bone · coronal · 0.27mm/px · 3 of 67 slices shown]
[im 14/67  bone]
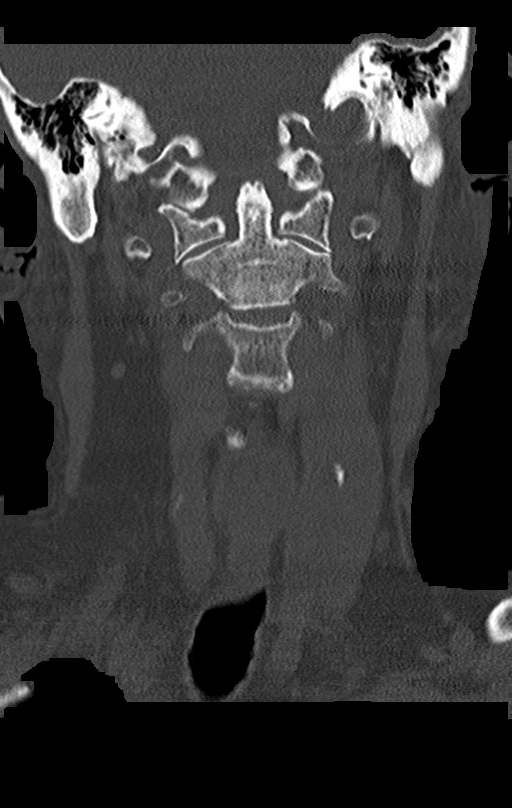
[im 27/67  bone]
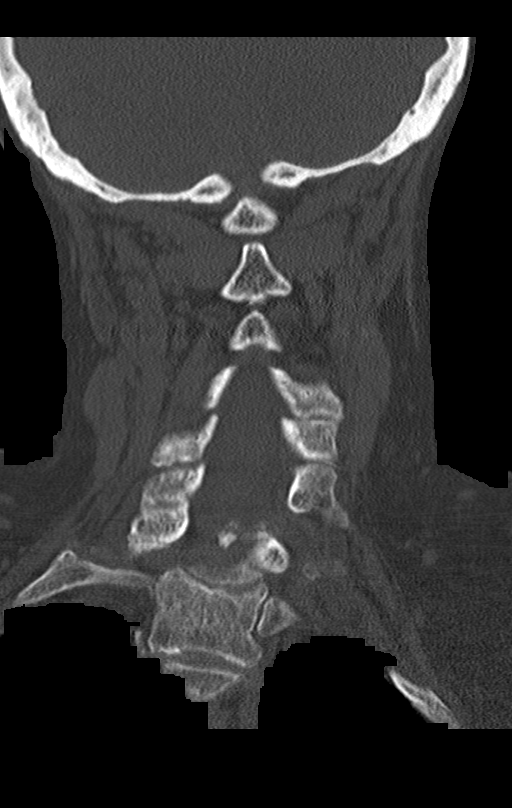
[im 40/67  bone]
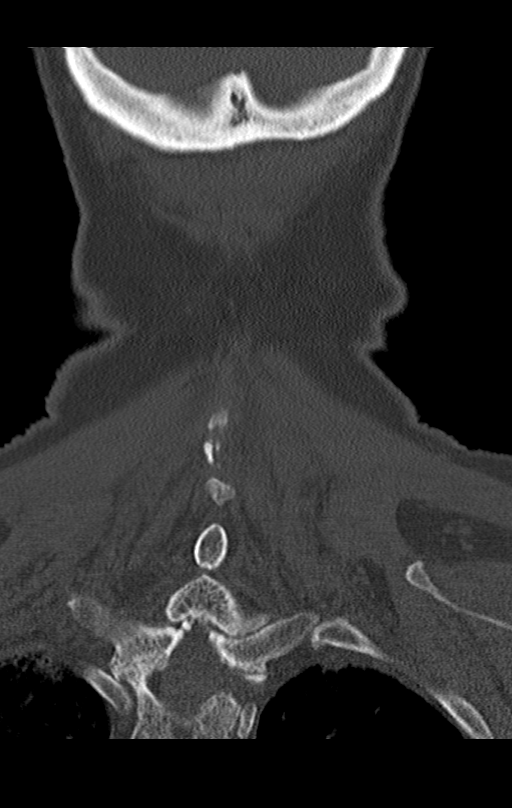

[Series 9: sagittal bone · sagittal · 0.30mm/px · 5 of 61 slices shown, 6 images]
[im 21/61  bone]
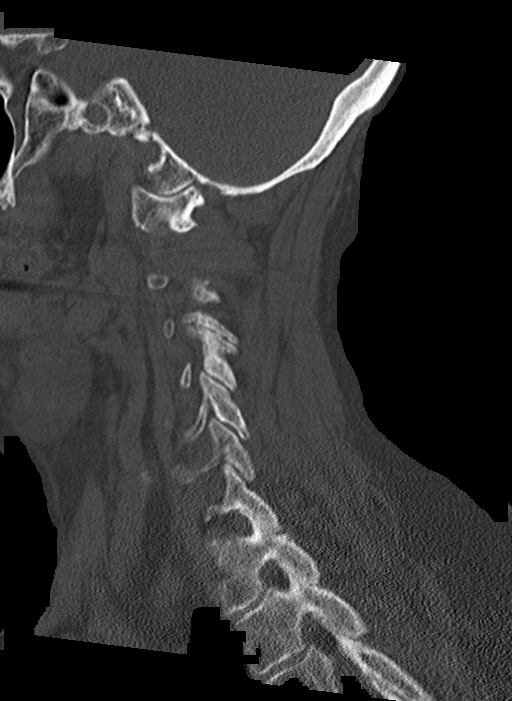
[im 26/61  bone]
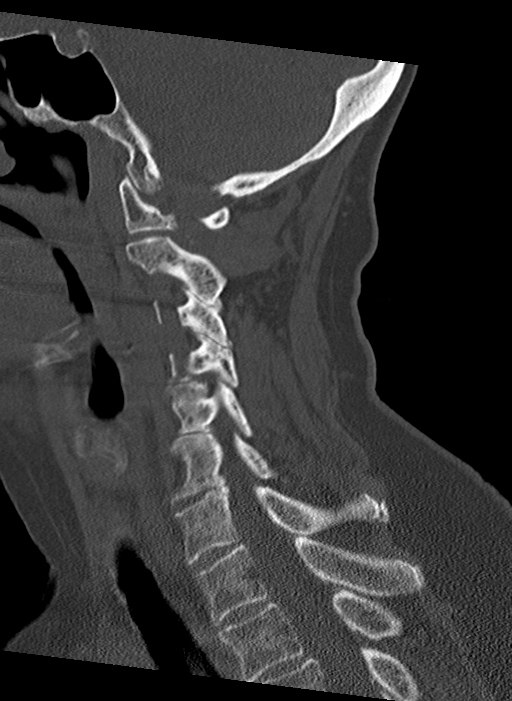
[im 31/61  soft-tissue]
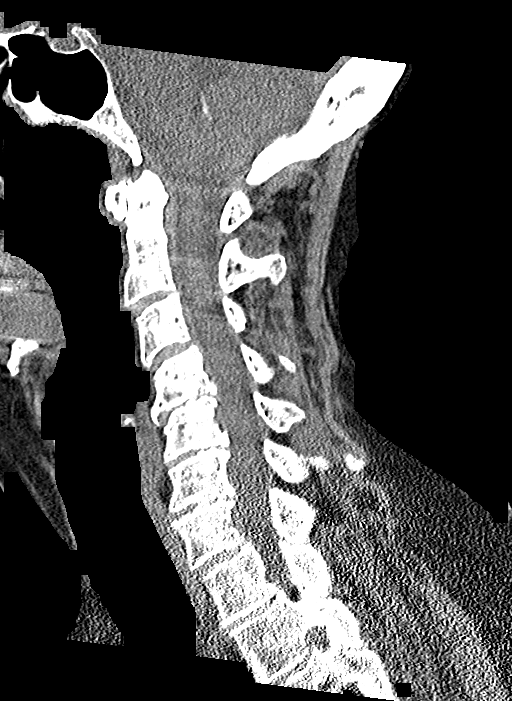
[im 31/61  bone]
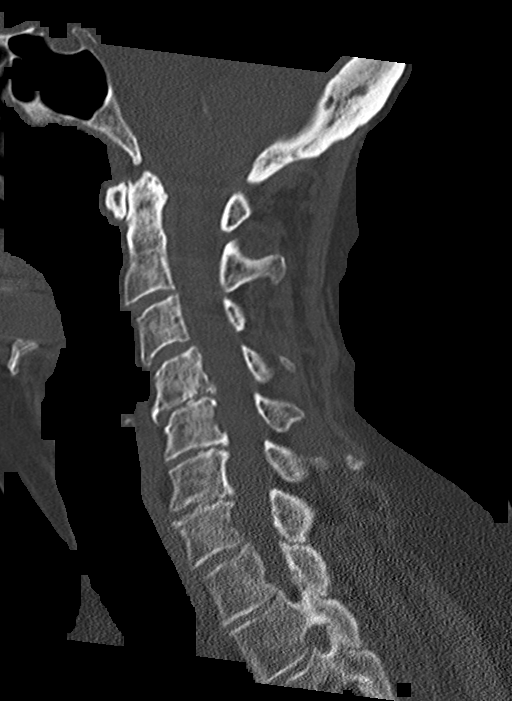
[im 36/61  bone]
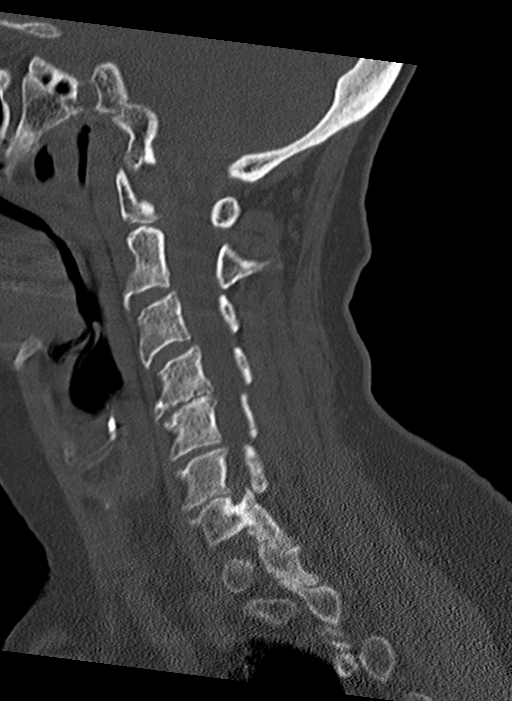
[im 41/61  bone]
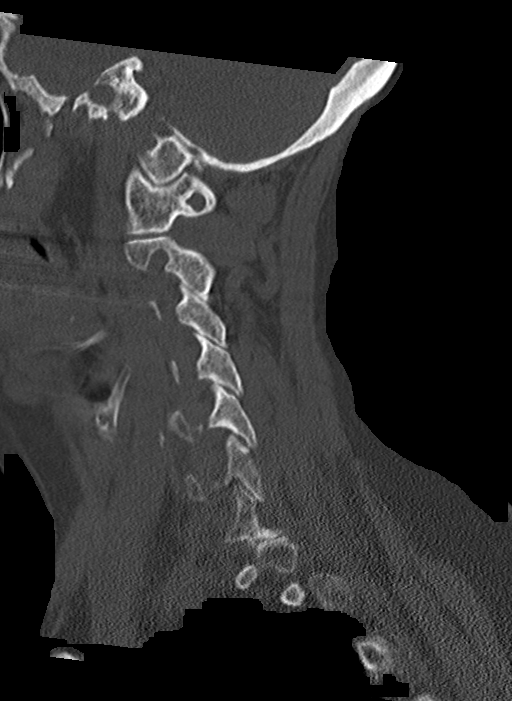

[12 of 33 positions shown; findings below may reference images not displayed]

FINDINGS: Alignment: There is mild dextroconvex curvature of the cervical
spine. Additionally there is reversal cervical lordosis attributable
to degenerative disc disease, apex at C5. The craniocervical
relationship appears intact. No splaying of the lateral masses of C1
on C2. The atlantodental interval is intact.

Skull base and vertebrae: No skull base fracture or suspicious
osseous lesions. No acute vertebral fracture or focal pathologic
process.

Soft tissues and spinal canal: No prevertebral soft tissue swelling.
No visible canal hematoma.

Disc levels:

C2-C3: Mild-to-moderate disc flattening. Minimal central disc bulge.
No significant central foraminal encroachment.

C3-C4: Moderate disc flattening without focal disc herniation or or
disc bulge. No significant central or foraminal encroachment. Mild
moderate right and mild left facet arthropathy.

C4-C5: Marked disc flattening with anterior posterior osteophytes.
No focal disc herniation. Moderate to marked right-sided foraminal
encroachment from uncovertebral joint osteoarthritis and uncinate
spurring. No significant left foraminal encroachment though there is
uncovertebral joint spurring as well.

C5-C6: Marked disc flattening with small posterior marginal
osteophytes, central to right central. Slight right foraminal
encroachment from uncinate spurring and uncovertebral joint
osteoarthritis.

C6-C7: Moderate to marked disc flattening, no significant central
nor foraminal encroachment. Central osteophyte. Touching upon the
ventral aspect of the thecal sac.

C7-T1: Moderate disc flattening without central foraminal
encroachment.

Upper chest: Extracranial carotid arteriosclerosis is noted
bilaterally. Subpleural fibrosis is seen at the lung apices.

Other: None
IMPRESSION: 1. No acute cervical spine fracture or posttraumatic listhesis.
2. Cervical spondylosis as above. Marked disc flattening C4-5, C5-6
and C6-7 as above described associated with uncovertebral joint
osteoarthritis and uncinate spurring contributing to variable
degrees of foraminal encroachment, greatest at C4-5 on the right.

## 2019-04-08 IMAGING — CT CT ABD-PELV W/ CM
2 of 5 series · 16 of 46 positions shown, 18 images · IV contrast (iopamidol)
Comparison: 05/23/2016

CLINICAL DATA: Low back pain, abdominal pain

EXAM:
CT ABDOMEN AND PELVIS WITH CONTRAST
TECHNIQUE: Multidetector CT imaging of the abdomen and pelvis was performed
using the standard protocol following bolus administration of
intravenous contrast.
CONTRAST:  100mL [id number] IOPAMIDOL ([id number]) INJECTION 61%

[Series 3: axial st · axial · 0.85mm/px · z∈[+1040,+1455]mm · 13 of 95 slices shown, 15 images]
[im 6/95  soft-tissue]
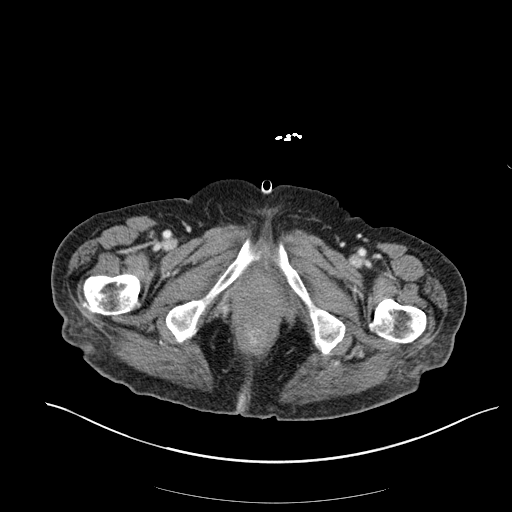
[im 6/95  bone]
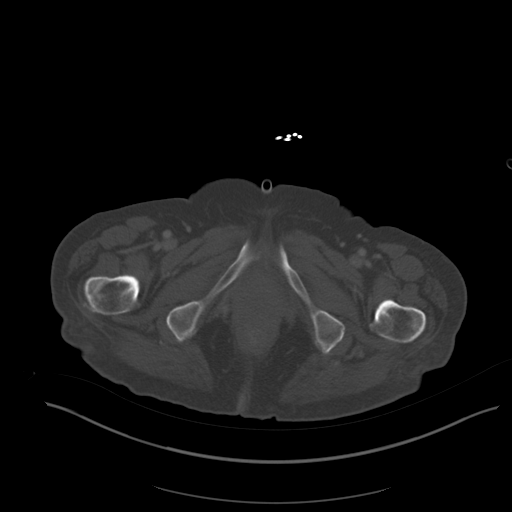
[im 12/95  soft-tissue]
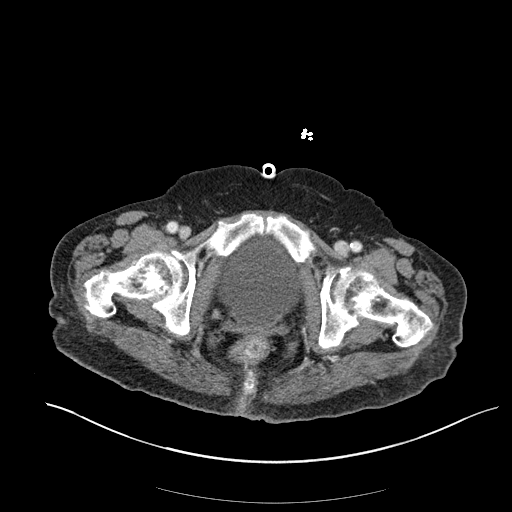
[im 18/95  soft-tissue]
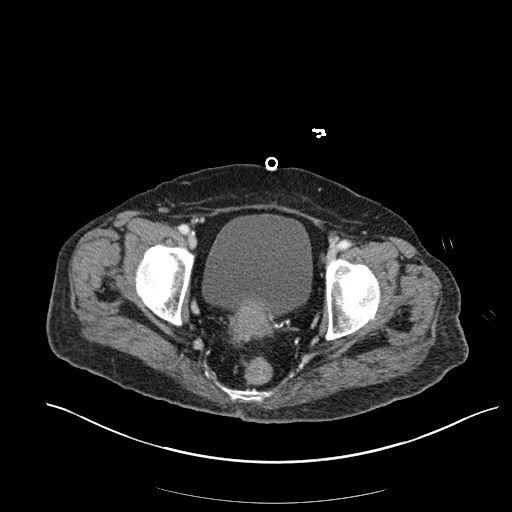
[im 30/95  soft-tissue]
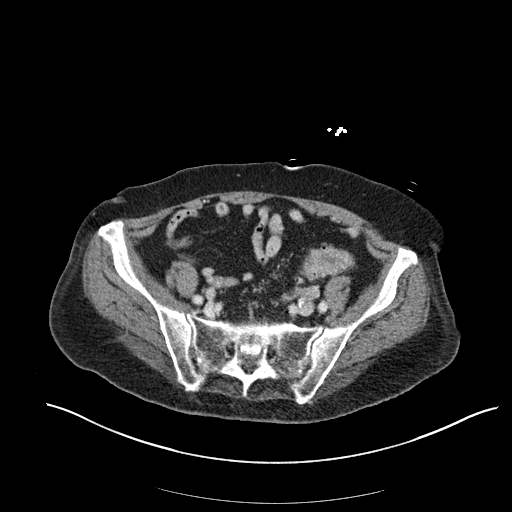
[im 36/95  soft-tissue]
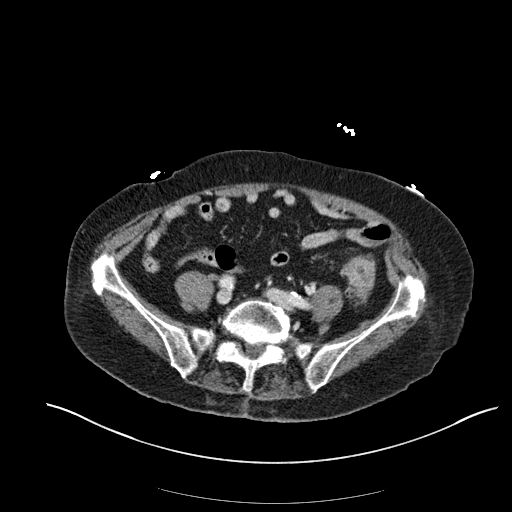
[im 42/95  soft-tissue]
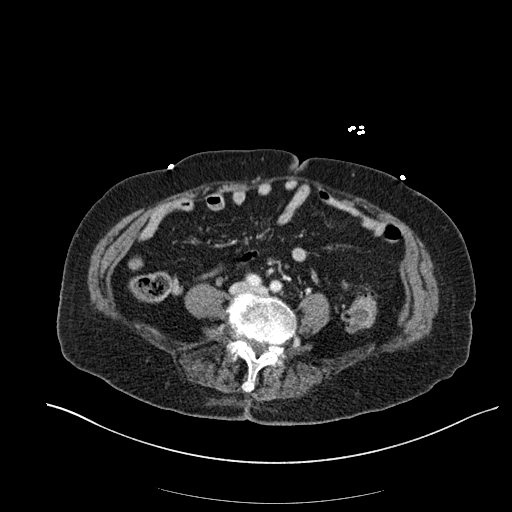
[im 48/95  soft-tissue]
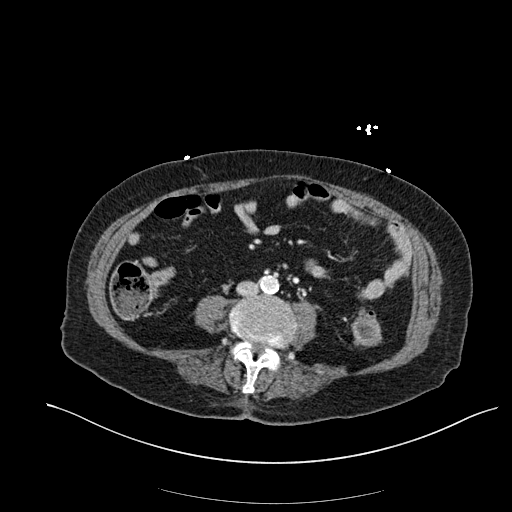
[im 53/95  soft-tissue]
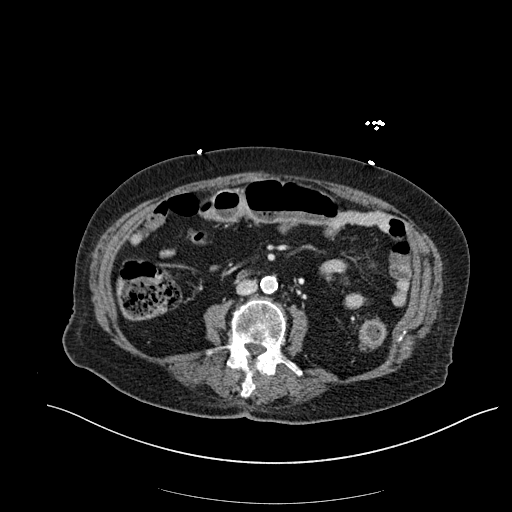
[im 59/95  soft-tissue]
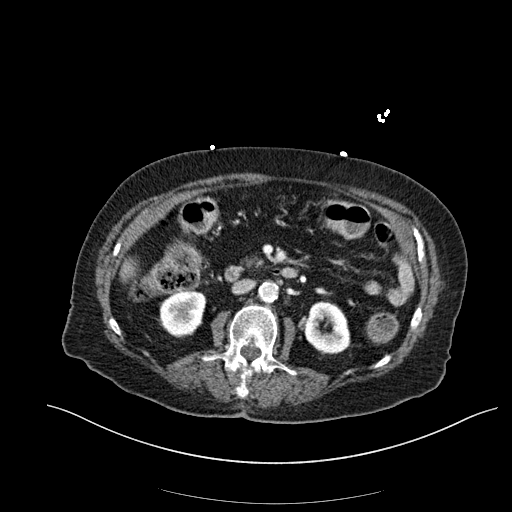
[im 59/95  bone]
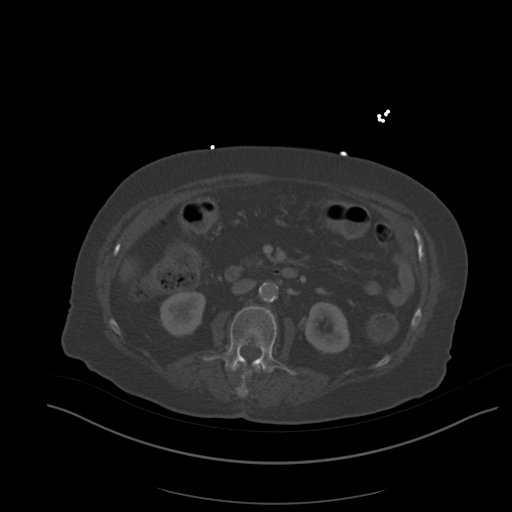
[im 65/95  soft-tissue]
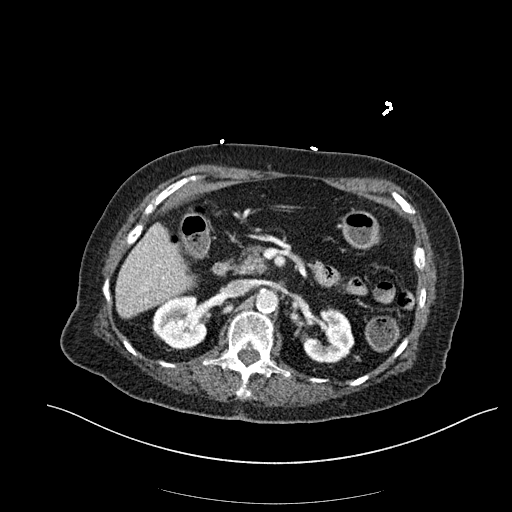
[im 77/95  soft-tissue]
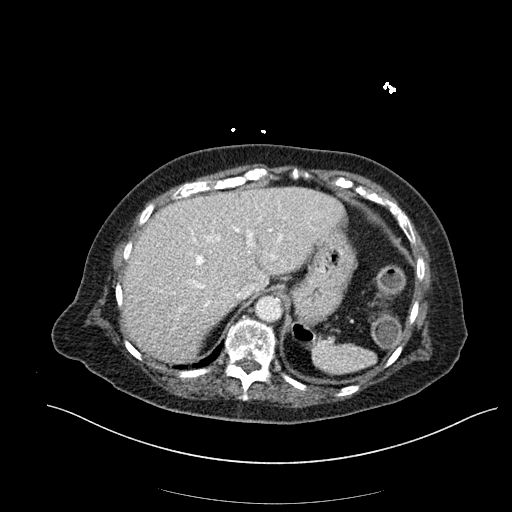
[im 83/95  soft-tissue]
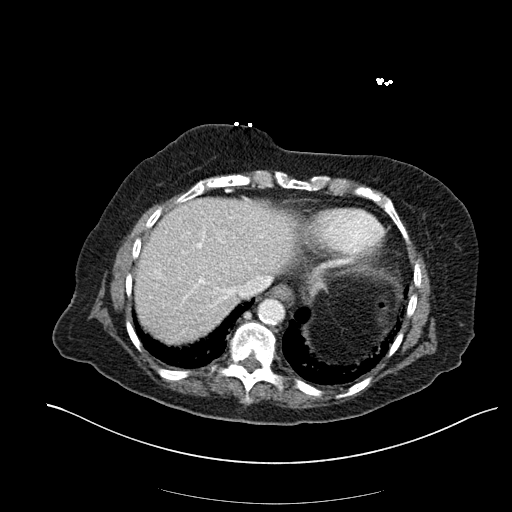
[im 89/95  soft-tissue]
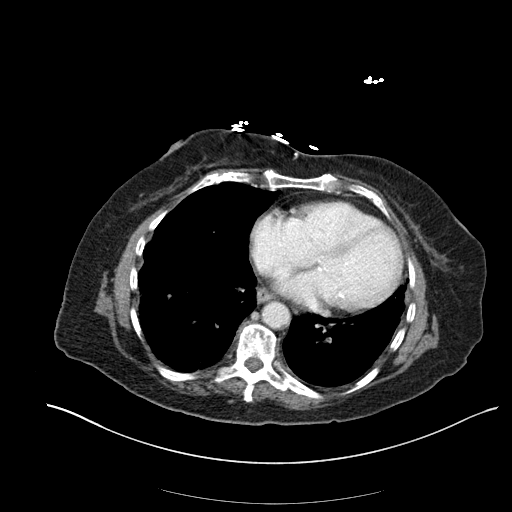

[Series 6: coronal st · coronal · 0.72mm/px · 3 of 86 slices shown]
[im 29/86  soft-tissue]
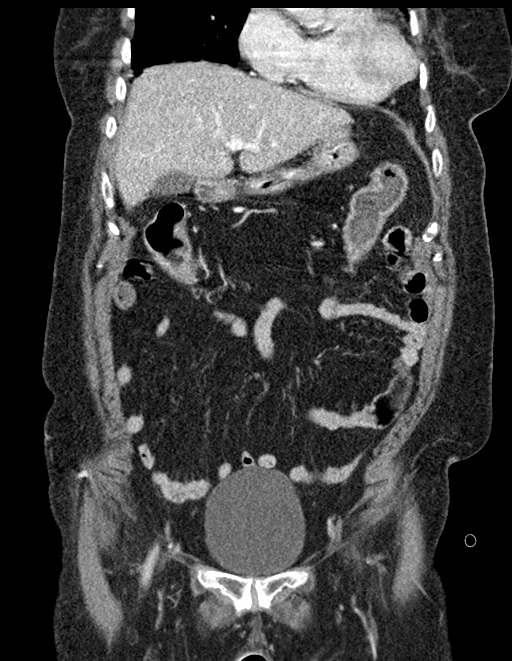
[im 38/86  soft-tissue]
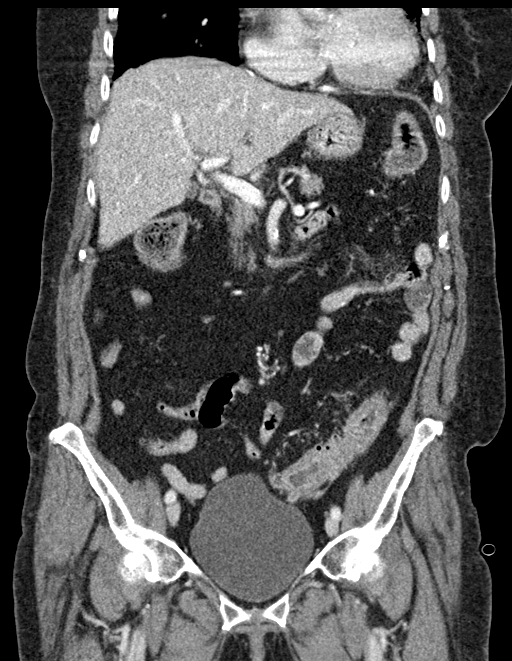
[im 48/86  soft-tissue]
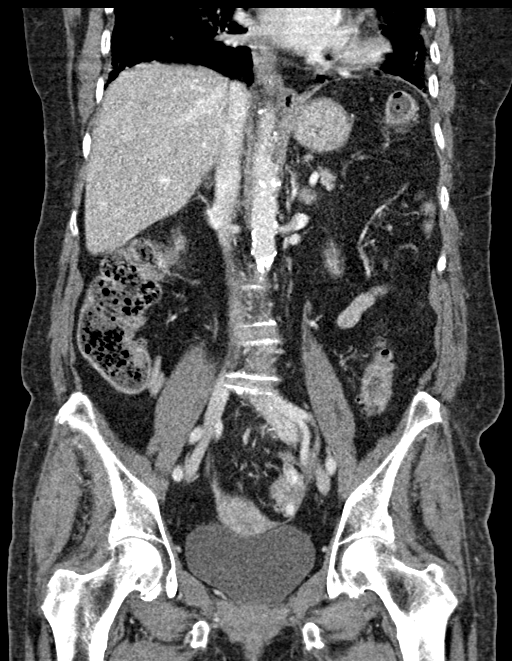

[16 of 46 positions shown; findings below may reference images not displayed]

FINDINGS: Lower chest: Bilateral lower lung chronic interstitial disease with
fibrosis.

Hepatobiliary: No focal liver abnormality is seen. No gallstones,
gallbladder wall thickening, or biliary dilatation.

Pancreas: Unremarkable. No pancreatic ductal dilatation or
surrounding inflammatory changes.

Spleen: Normal in size without focal abnormality.

Adrenals/Urinary Tract: Adrenal glands are unremarkable. Kidneys are
normal, without renal calculi, focal lesion, or hydronephrosis.
Bladder is unremarkable.

Stomach/Bowel: Stomach is within normal limits. Incidental note made
of a posterior gastric diverticulum. No pneumatosis,
pneumoperitoneum or portal venous gas. No normal nor abnormal
appendix is identified. Diverticulosis of the descending and sigmoid
colon. Mild bowel wall thickening involving the transverse,
descending and sigmoid colon concerning for mild colitis.

Vascular/Lymphatic: Abdominal aortic atherosclerosis. No
lymphadenopathy.

Reproductive: Uterus and bilateral adnexa are unremarkable.

Other: No abdominal wall hernia or abnormality. No abdominopelvic
ascites.

Musculoskeletal: No acute osseous abnormality. No aggressive osseous
lesion. Degenerative disc disease with disc height loss at L5-S1
with bilateral facet arthropathy.
IMPRESSION: 1. Mild bowel wall thickening involving the transverse, descending
and sigmoid colon concerning for mild colitis.
2. Diverticulosis of the descending and sigmoid colon.
3.  Aortic Atherosclerosis ([id number].G).

## 2019-04-27 IMAGING — CT CT ABD-PELV W/ CM
3 of 8 series · 15 of 46 positions shown, 17 images · IV contrast (ISOVUE)
Comparison: None.

CLINICAL DATA: [AGE]/o  F; back pain.

EXAM:
CT ABDOMEN AND PELVIS WITH CONTRAST
CT LUMBAR SPINE WITHOUT CONTRAST
TECHNIQUE: Multidetector CT imaging of the abdomen and pelvis was performed
using the standard protocol following bolus administration of
intravenous contrast.
Multidetector CT imaging of the lumbar spine was performed without
intravenous contrast administration. Multiplanar CT image
reconstructions were also generated.
CONTRAST:  100mL [id number] IOPAMIDOL ([id number]) INJECTION 61%

[Series 2: axial st · axial · 0.70mm/px · z∈[-401,-51]mm · 9 of 88 slices shown, 11 images]
[im 9/88  soft-tissue]
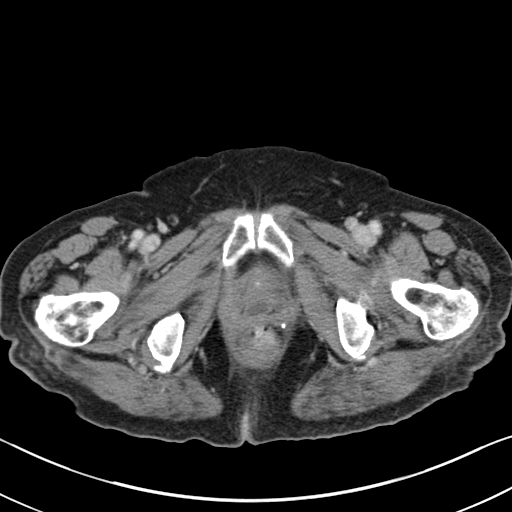
[im 9/88  bone]
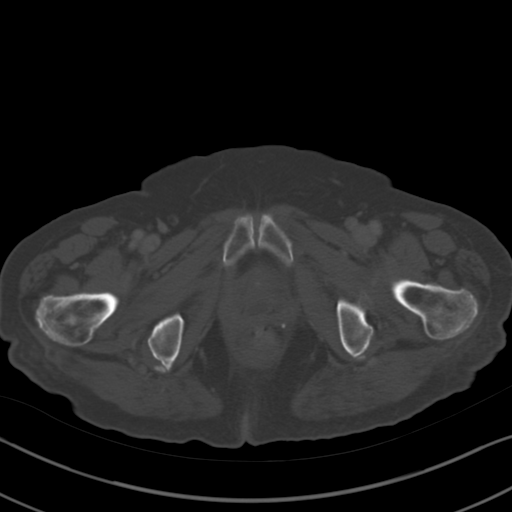
[im 18/88  soft-tissue]
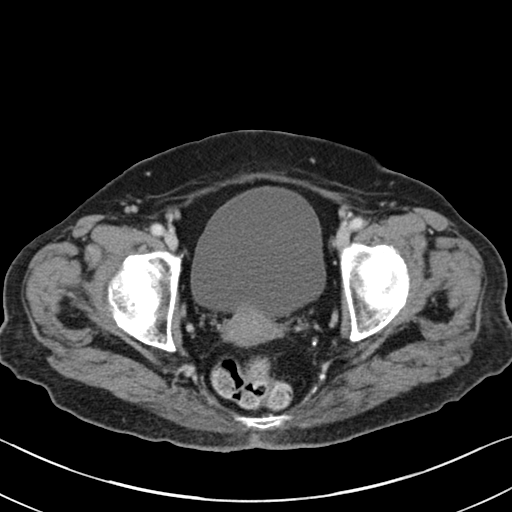
[im 27/88  soft-tissue]
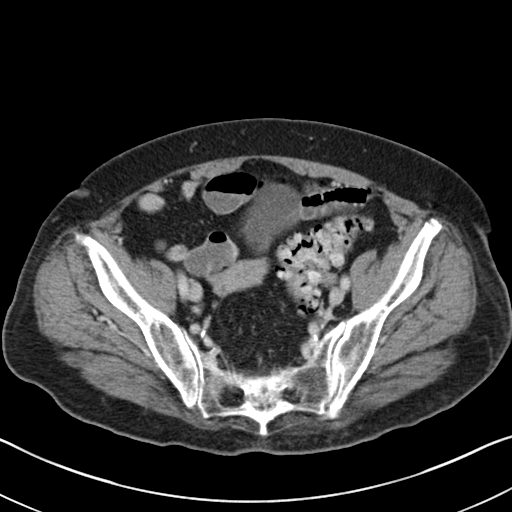
[im 35/88  soft-tissue]
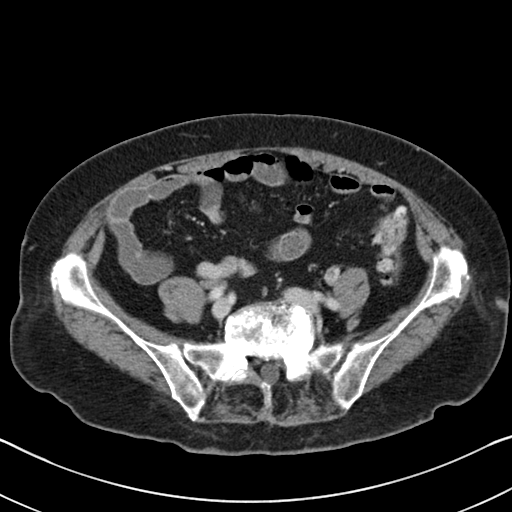
[im 44/88  soft-tissue]
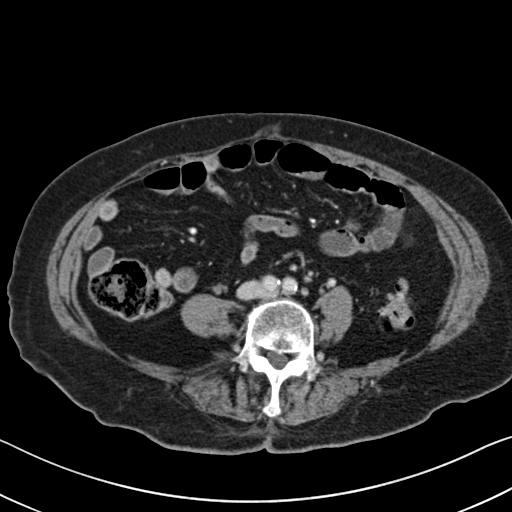
[im 53/88  soft-tissue]
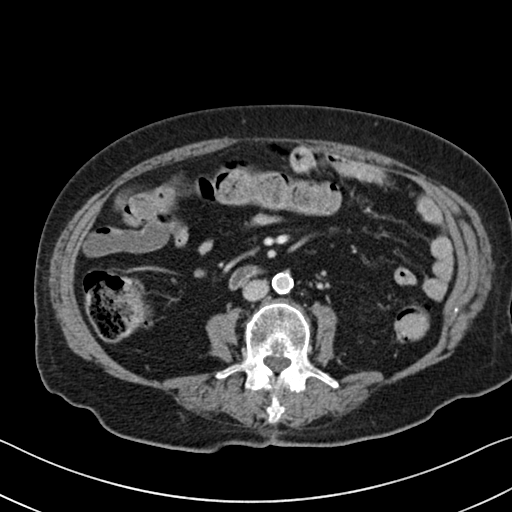
[im 61/88  soft-tissue]
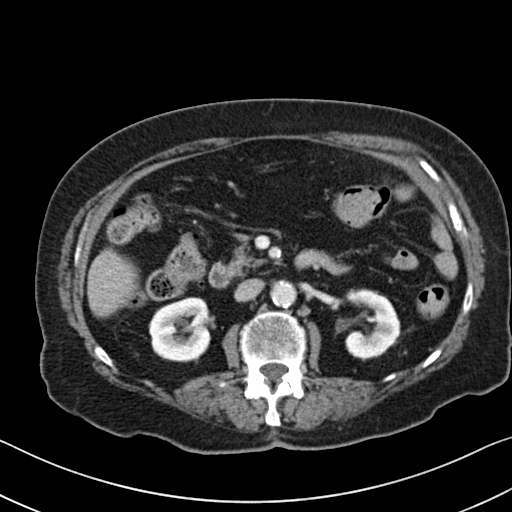
[im 70/88  soft-tissue]
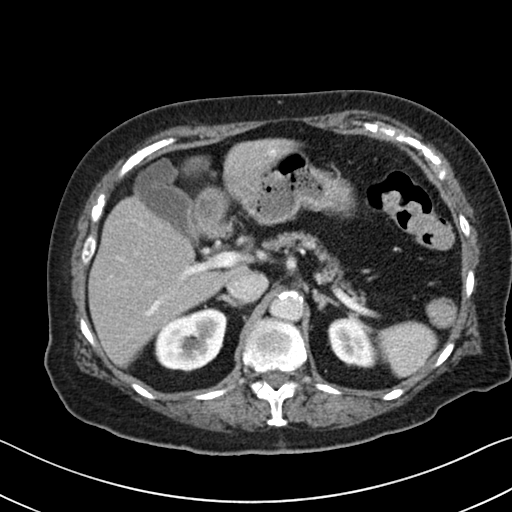
[im 79/88  soft-tissue]
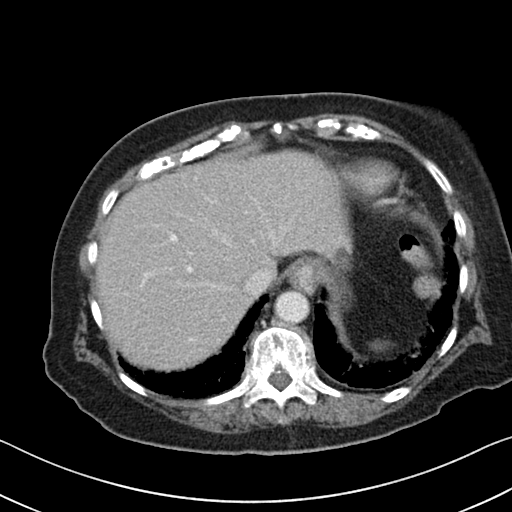
[im 79/88  bone]
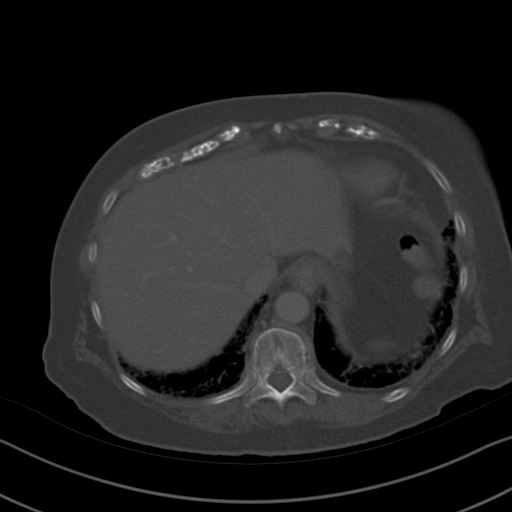

[Series 4: lung bases · axial · 0.70mm/px · z∈[-66,-26]mm · 3 of 41 slices shown]
[im 11/41  bone]
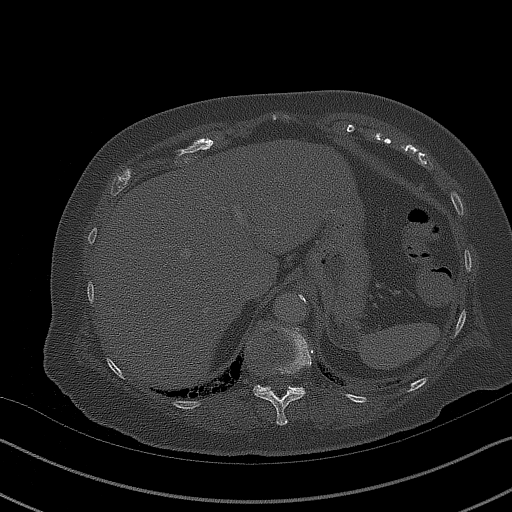
[im 21/41  bone]
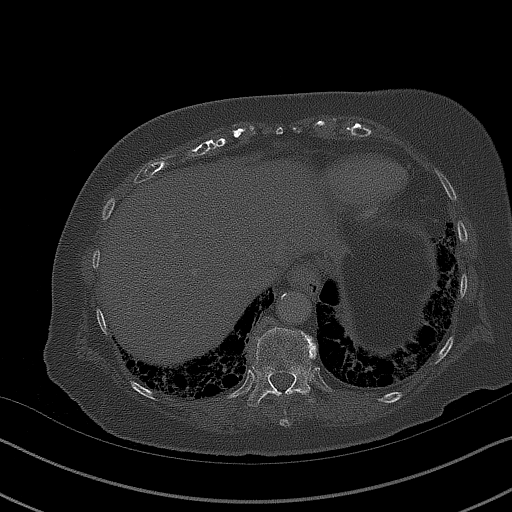
[im 31/41  bone]
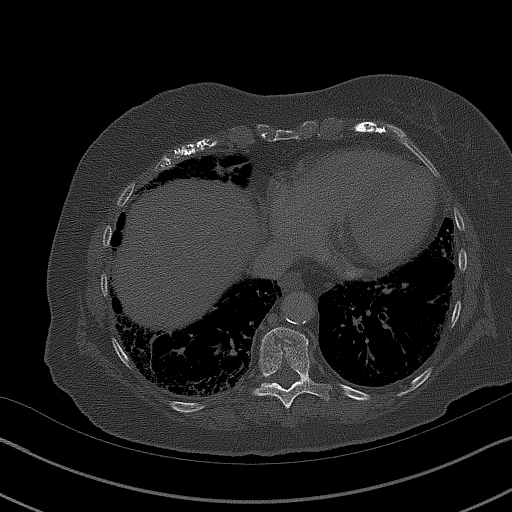

[Series 5: coronal st · coronal · 0.63mm/px · 3 of 121 slices shown]
[im 41/121  soft-tissue]
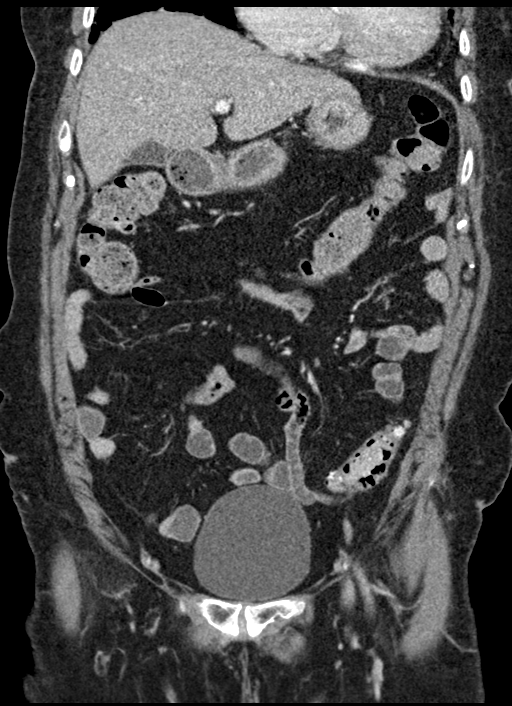
[im 61/121  soft-tissue]
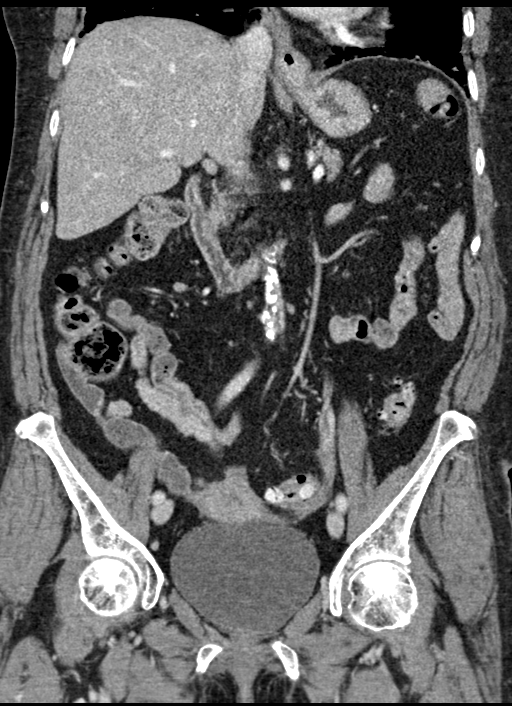
[im 81/121  soft-tissue]
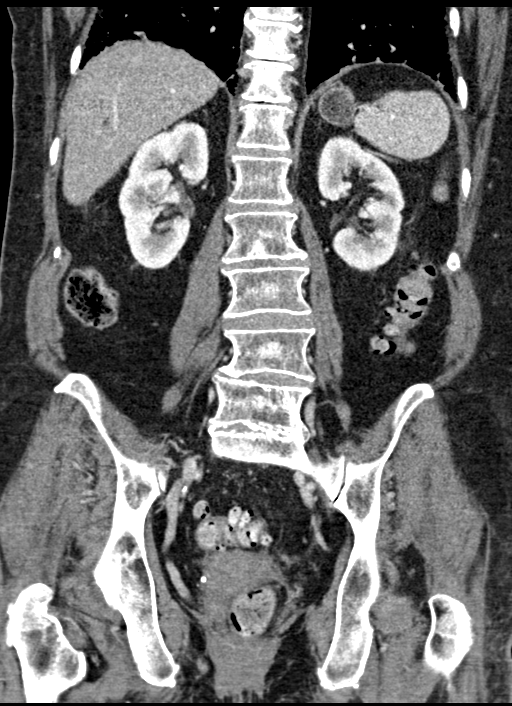

[15 of 46 positions shown; findings below may reference images not displayed]

FINDINGS: Lumbar spine findings:

Segmentation: 5 lumbar type vertebrae.

Alignment: Mild lumbar levocurvature with apex at L4. Straightening
of lumbar lordosis.

Vertebrae: No acute fracture or focal pathologic process.

Paraspinal and other soft tissues: Negative.

Disc levels: Multilevel discogenic degenerative changes with loss of
intervertebral disc space height greatest at the L2-3, L3-4, and
L5-S1 levels. Advanced lower lumbar facet arthrosis. Disc and facet
degenerative changes contribute to right-sided L3-4 and L4-5
foraminal stenosis as well as left-sided L3-S1 foraminal stenosis.
Mild-to-moderate spinal canal stenosis at the L3-4 and L4-5 levels.

Abdomen and pelvis findings:

Lower chest: Stable peripheral and basilar lung fibrosis.

Hepatobiliary: No focal liver abnormality is seen. No gallstones,
gallbladder wall thickening, or biliary dilatation.

Pancreas: Unremarkable. No pancreatic ductal dilatation or
surrounding inflammatory changes.

Spleen: Normal in size without focal abnormality.

Adrenals/Urinary Tract: Adrenal glands are unremarkable. Multiple
stable renal cysts measuring up to 22 mm arising from left
interpolar kidney. Kidneys are normal, without renal calculi, focal
lesion, or hydronephrosis. Bladder is unremarkable.

Stomach/Bowel: Diverticulum of gastric cardia. Otherwise stomach is
within normal limits. Appendectomy. No evidence of bowel wall
thickening, distention, or inflammatory changes. Extensive sigmoid
diverticulosis, no findings of acute diverticulitis

Vascular/Lymphatic: Aortic atherosclerosis. No enlarged abdominal or
pelvic lymph nodes.

Reproductive: Uterus and bilateral adnexa are unremarkable.

Other: No abdominal wall hernia or abnormality. No abdominopelvic
ascites.

Musculoskeletal: No fracture is seen.
IMPRESSION: CT lumbar spine:

1. No acute fracture or dislocation.
2. Stable mild lumbar levocurvature and mild-to-moderate multilevel
spondylosis greatest at the L3-S1.

CT abdomen and pelvis:

1. No acute process identified.
2. Extensive sigmoid diverticulosis, no findings of acute
diverticulitis.
3.  Aortic Atherosclerosis ([id number]).
4. Stable lung base fibrosis.

## 2019-04-27 IMAGING — CR DG ELBOW COMPLETE 3+V*L*
4 series · 4 of 4 positions shown · non-contrast
Comparison: None.

CLINICAL DATA: [AGE]/o  F; anterior elbow pain. No known injury.

EXAM:
LEFT ELBOW - COMPLETE 3+ VIEW

[x elbow ap left]
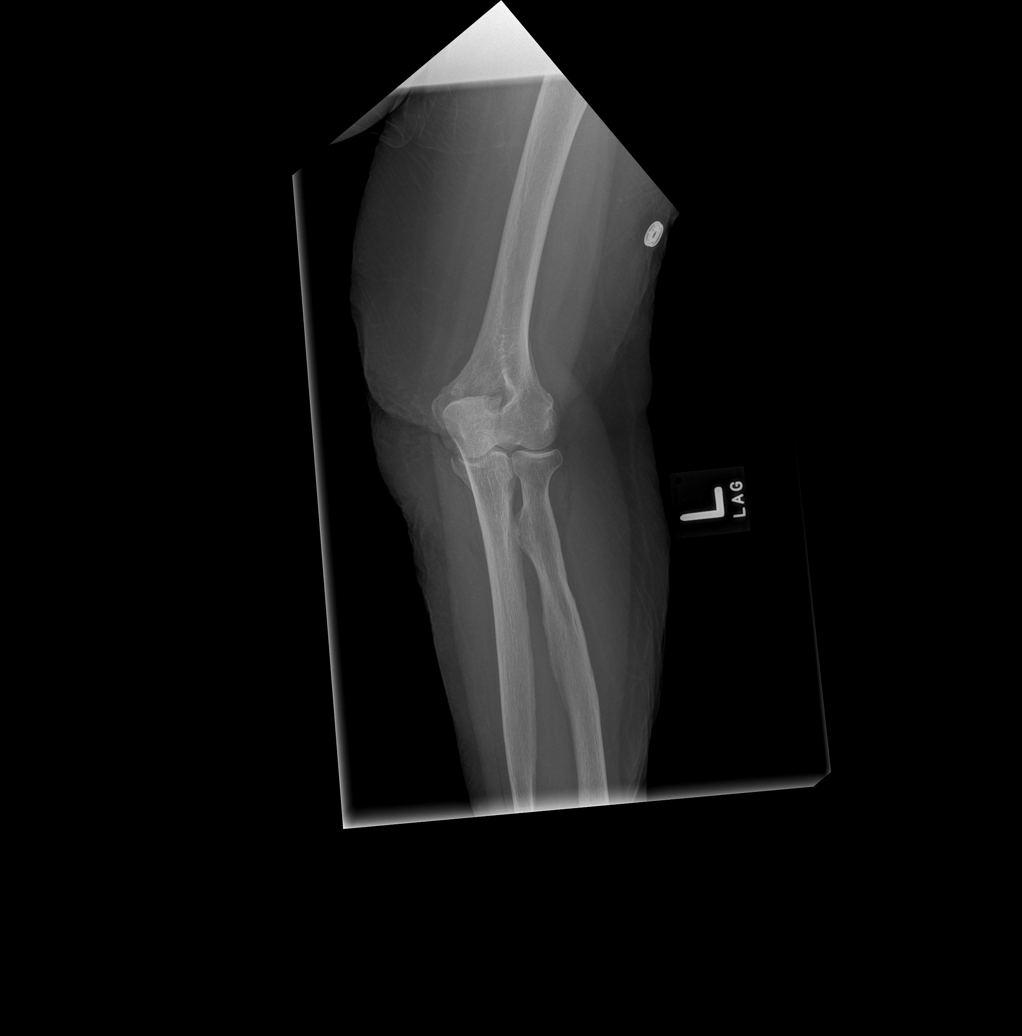

[x elbow obl left (1 of 2)]
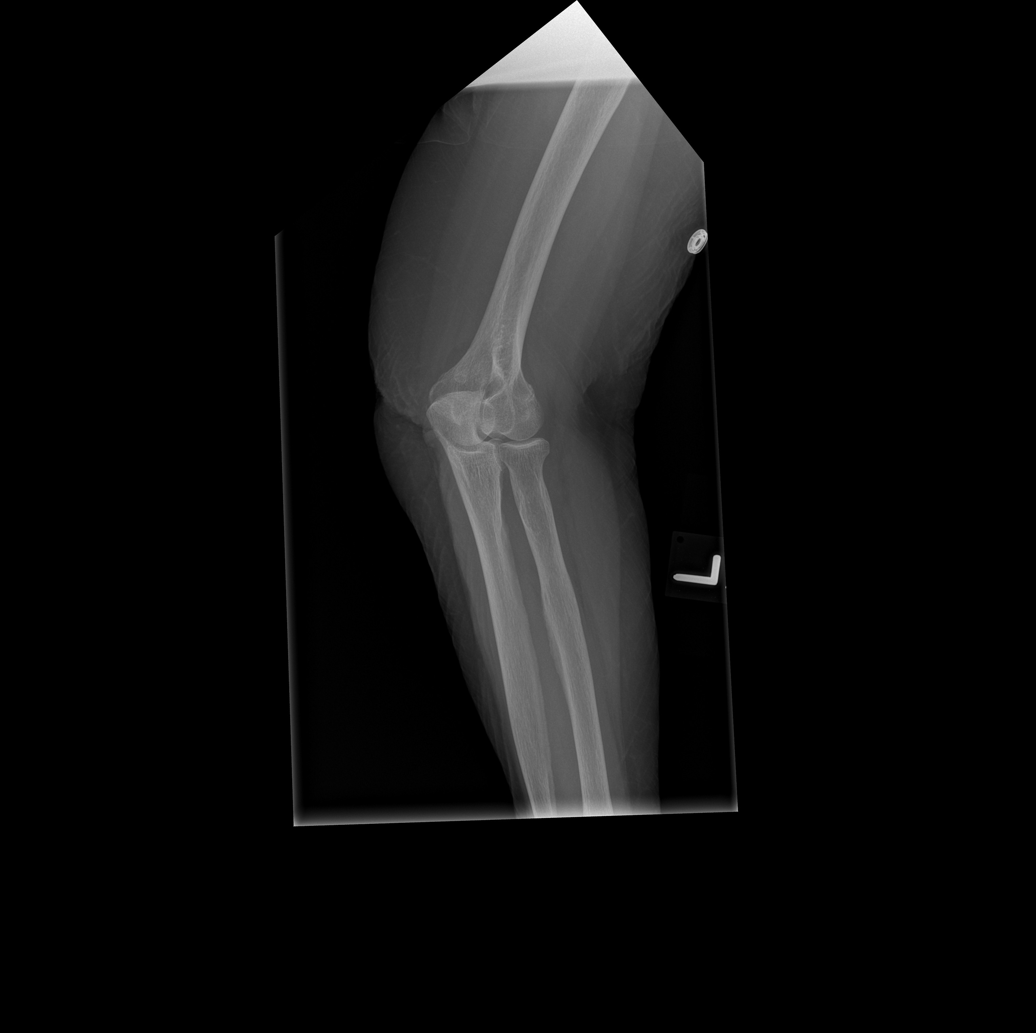

[x elbow obl left (2 of 2)]
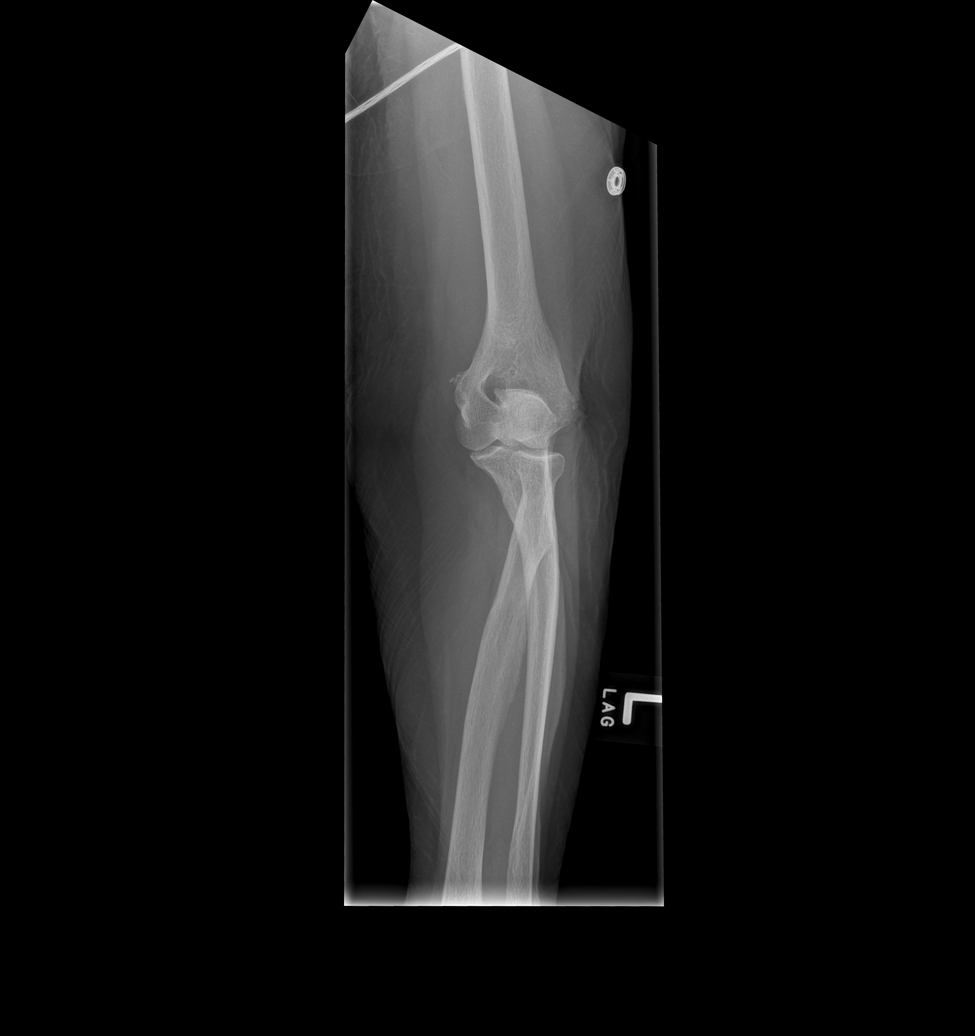

[x elbow lat left]
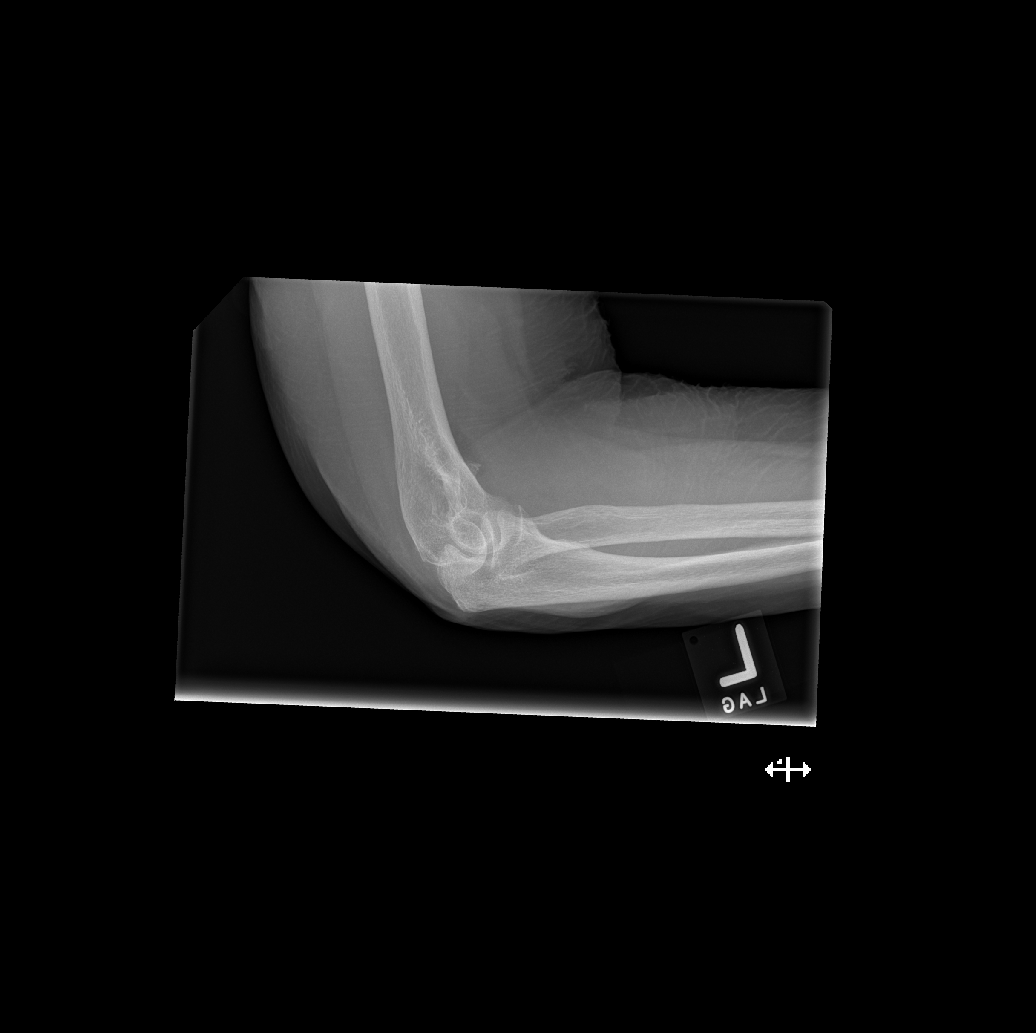

[4 of 4 positions shown; findings below may reference images not displayed]

FINDINGS: No acute fracture or dislocation identified. Prominent anterior fat
pad may represent a joint effusion. Medial epicondyle enthesophyte.
IMPRESSION: 1. No acute fracture or dislocation identified.
2. Prominent anterior fat pad may represent a joint effusion.
3. Medial epicondyle enthesophyte.

## 2019-07-06 IMAGING — CR DG ABDOMEN ACUTE W/ 1V CHEST
3 series · 3 of 3 positions shown · non-contrast
Comparison: Chest x-ray 05/24/2016, CT 03/31/2018

CLINICAL DATA: Nausea and emesis

EXAM:
DG ABDOMEN ACUTE W/ 1V CHEST

[w abdomen decub]
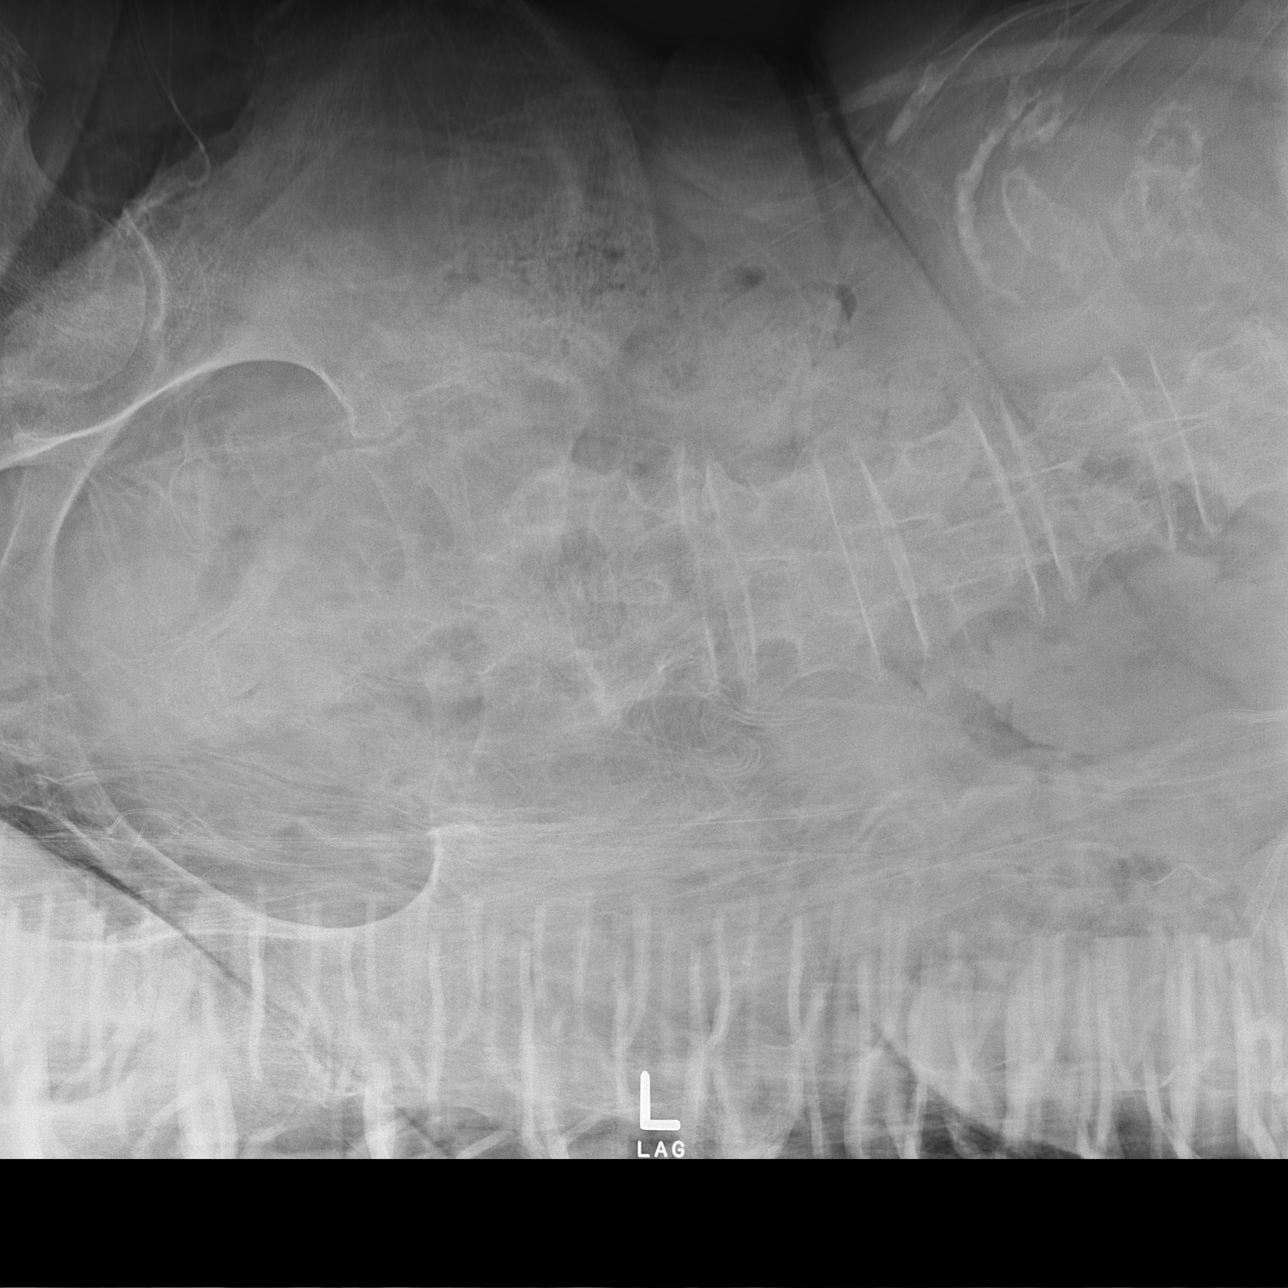

[x abdomen supine]
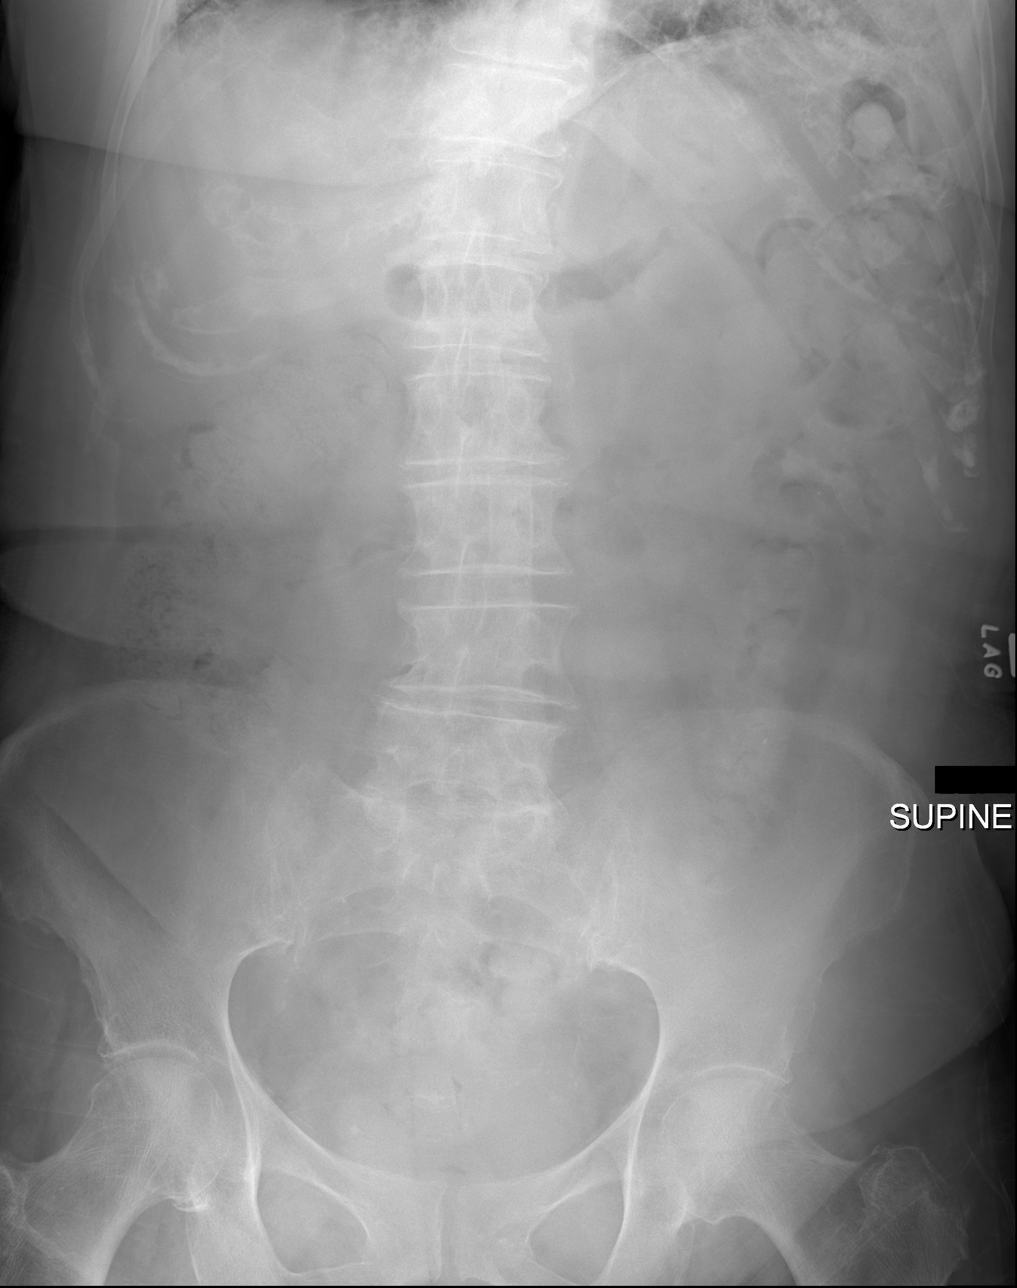

[x chest ap]
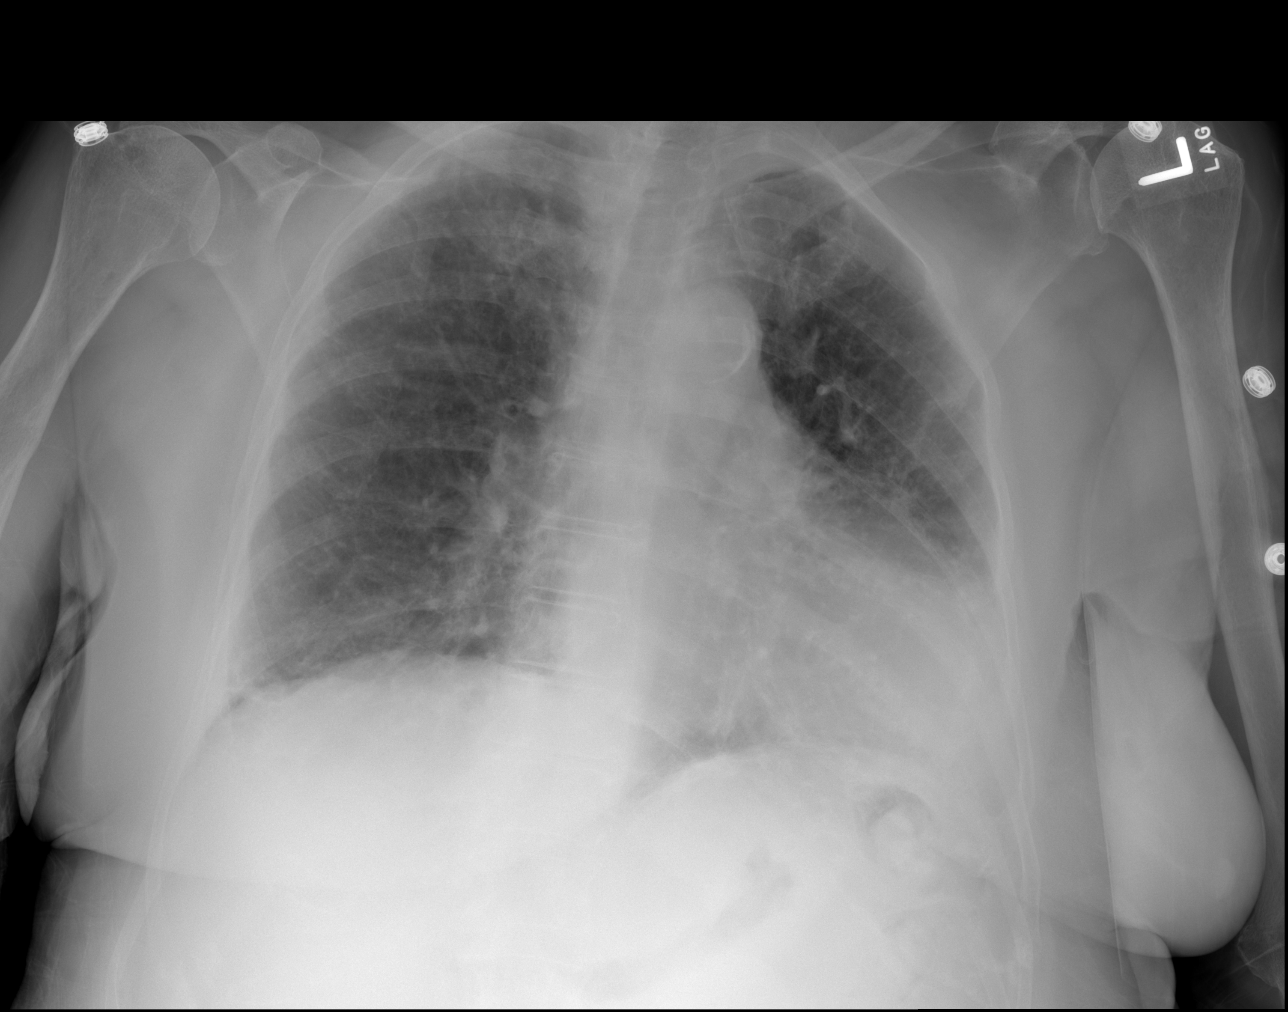

[3 of 3 positions shown; findings below may reference images not displayed]

FINDINGS: Single-view chest demonstrates bibasilar fibrosis. Mild cardiomegaly
with aortic atherosclerosis. No focal consolidation or pleural
effusion.

Supine and decubitus views of the abdomen. Nonobstructed gas pattern
with large amount of stool in the colon. No gross free air allowing
for limited decubitus view.
IMPRESSION: 1. Mild cardiomegaly and bibasilar fibrosis.
2. Nonobstructed gas pattern with moderate to large stool in the
colon

## 2019-07-13 ENCOUNTER — Telehealth (INDEPENDENT_AMBULATORY_CARE_PROVIDER_SITE_OTHER): Payer: Medicare Other | Admitting: Cardiology

## 2019-07-13 ENCOUNTER — Encounter: Payer: Self-pay | Admitting: Cardiology

## 2019-07-13 VITALS — Ht 64.0 in

## 2019-07-13 DIAGNOSIS — R0789 Other chest pain: Secondary | ICD-10-CM

## 2019-07-13 NOTE — Progress Notes (Signed)
Virtual Visit via Telephone Note   This visit type was conducted due to national recommendations for restrictions regarding the COVID-19 Pandemic (e.g. social distancing) in an effort to limit this patient's exposure and mitigate transmission in our community.  Due to her co-morbid illnesses, this patient is at least at moderate risk for complications without adequate follow up.  This format is felt to be most appropriate for this patient at this time.  The patient did not have access to video technology/had technical difficulties with video requiring transitioning to audio format only (telephone).  All issues noted in this document were discussed and addressed.  No physical exam could be performed with this format.  Please refer to the patient's chart for her  consent to telehealth for [facility].   The patient was identified using 2 identifiers.  Date:  07/13/2019   ID:  Shelia Black, DOB 01/06/1922, MRN [id number]  Patient Location: Home Provider Location: Home  PCP:  [name], PA-C  Cardiologist:  Dr Gwenlyn Found Electrophysiologist:  None   Evaluation Performed:  Follow-Up Visit  Chief Complaint:  none  History of Present Illness:    Shelia Black is a 86 y.o. female who saw Dr. Gwenlyn Found 6 months ago for chest pain which he felt was atypical.  No further work-up was recommended.  The patient's daughter is also a patient of Dr. Kennon Holter, I spoke with her on the phone today.  She was contacted today for 49-monthfollow-up.  She tells me [place]  Her mother has not had recurrent chest pain.  Overall her mother has been doing well, she is limited in her mobility secondary to chronic back issues.  The patient does not have symptoms concerning for COVID-19 infection (fever, chills, cough, or new shortness of breath).  Pt and daughter are vaccinated   Past Medical History:  Diagnosis Date  . Arthritis   . Cataract   . Chronic cough   .  COPD (chronic obstructive pulmonary disease) ([place]   . Diabetes mellitus without complication ([place]   . Diverticulitis   . Diverticulosis   . GERD (gastroesophageal reflux disease)   . Hepatic steatosis   . Hiatal hernia   . Hypothyroidism   . ILD (interstitial lung disease) ([place]   . Pulmonary fibrosis ([place]   . Thyroid disease   . Vertigo    Past Surgical History:  Procedure Laterality Date  . APPENDECTOMY    . INTRAOCULAR LENS INSERTION  1997  . REPLACEMENT TOTAL KNEE BILATERAL    . SHOULDER SURGERY Right    tendonitis     Current Meds  Medication Sig  . acetaminophen (TYLENOL) 500 MG tablet Take 500-1,000 mg by mouth every 6 (six) hours as needed for moderate pain or fever.   .Marland Kitchenalbuterol (PROVENTIL HFA;VENTOLIN HFA) 108 (90 Base) MCG/ACT inhaler Inhale 1 puff into the lungs every 6 (six) hours as needed for wheezing or shortness of breath.  .Marland Kitchenammonium lactate (LAC-HYDRIN) 12 % lotion Apply 1 application topically daily as needed for dry skin.   . Ascorbic Acid (VITAMIN C) 1000 MG tablet Take 1,000 mg by mouth daily.  . Calcium Carbonate (CALCIUM 600 PO) Take 1 tablet by mouth 2 (two) times daily.  . chlorpheniramine-HYDROcodone (TUSSIONEX) 10-8 MG/5ML SUER Take 5 mLs by mouth every 12 (twelve) hours as needed for cough.   . Cholecalciferol (VITAMIN D-3) 5000 units TABS Take 1 tablet by mouth daily.  . diphenoxylate-atropine (LOMOTIL) 2.5-0.025 MG tablet Take 2  tablets by mouth 4 (four) times daily as needed for diarrhea or loose stools. diarrhea  . levothyroxine (SYNTHROID) 75 MCG tablet Take 75 mcg by mouth daily before breakfast.  . metFORMIN (GLUCOPHAGE) 500 MG tablet Take 1 tablet by mouth daily.  Marland Kitchen omeprazole (PRILOSEC) 40 MG capsule Take 1 capsule (40 mg total) by mouth daily.  Vladimir Faster Glycol-Propyl Glycol (SYSTANE) 0.4-0.3 % SOLN Apply 1 drop to eye daily as needed (dry eyes).  . Probiotic Product (ALIGN PO) Take 1 tablet by mouth daily.  . Zinc 50 MG CAPS Take by  mouth daily.     Allergies:   Oxycodone-acetaminophen, Penicillins, Codeine, Percocet [oxycodone-acetaminophen], and Tramadol   Social History   Tobacco Use  . Smoking status: Never Smoker  . Smokeless tobacco: Never Used  Substance Use Topics  . Alcohol use: No  . Drug use: No     Family Hx: The patient's family history includes Diabetes in her mother and sister; Hypertension in her father; Kidney cancer in her mother; Skin cancer in her father; Supraventricular tachycardia in her father.  ROS:   Please see the history of present illness.    All other systems reviewed and are negative.   Prior CV studies:   The following studies were reviewed today:    Labs/Other Tests and Data Reviewed:    EKG:  An ECG dated 01/14/2019 was personally reviewed today and demonstrated:  NSR-HR 77, poor anterior RW  Recent Labs: 08/05/2018: ALT 12 10/07/2018: Hemoglobin 12.9; Platelets 243 10/08/2018: BUN 37; Creatinine, Ser 1.12; Potassium 4.5; Sodium 135   Recent Lipid Panel No results found for: CHOL, TRIG, HDL, CHOLHDL, LDLCALC, LDLDIRECT  Wt Readings from Last 3 Encounters:  01/14/19 146 lb 3.2 oz (66.3 kg)  10/05/18 147 lb 0.8 oz (66.7 kg)  08/05/18 139 lb 15.9 oz (63.5 kg)     Objective:    Vital Signs:  Ht 5' 4"  (1.626 m)   LMP  (LMP Unknown)   BMI 25.10 kg/m    VITAL SIGNS:  reviewed  ASSESSMENT & PLAN:    Chest pain- Felt to be atypical- no further work up recommended.  COVID-19 Education: The signs and symptoms of COVID-19 were discussed with the patient and how to seek care for testing (follow up with PCP or arrange E-visit).  The importance of social distancing was discussed today.  Time:   Today, I have spent 10 minutes with the patient with telehealth technology discussing the above problems.     Medication Adjustments/Labs and Tests Ordered: Current medicines are reviewed at length with the patient today.  Concerns regarding medicines are outlined above.    Tests Ordered: No orders of the defined types were placed in this encounter.   Medication Changes: No orders of the defined types were placed in this encounter.   Follow Up:  Either In Person or Virtual prn  Signed, Kerin Ransom, PA-C  07/13/2019 9:01 AM    [place]

## 2019-07-13 NOTE — Patient Instructions (Signed)
Medication Instructions:  Your physician recommends that you continue on your current medications as directed. Please refer to the Current Medication list given to you today.  *If you need a refill on your cardiac medications before your next appointment, please call your pharmacy*   Follow-Up: At [facility], you and your health needs are our priority.  As part of our continuing mission to provide you with exceptional heart care, we have created designated Provider Care Teams.  These Care Teams include your primary Cardiologist (physician) and Advanced Practice Providers (APPs -  Physician Assistants and Nurse Practitioners) who all work together to provide you with the care you need, when you need it.  We recommend signing up for the patient portal called "MyChart".  Sign up information is provided on this After Visit Summary.  MyChart is used to connect with patients for Virtual Visits (Telemedicine).  Patients are able to view lab/test results, encounter notes, upcoming appointments, etc.  Non-urgent messages can be sent to your provider as well.   To learn more about what you can do with MyChart, go to [email or web address].    Your next appointment:   As needed

## 2019-08-30 IMAGING — DX RIGHT HAND - COMPLETE 3+ VIEW
3 series · 3 of 3 positions shown · non-contrast
Comparison: None.

CLINICAL DATA: [AGE] female with 4 days of right hand pain,
thumb and index finger pain.

EXAM:
RIGHT HAND - COMPLETE 3+ VIEW

[hand ap]
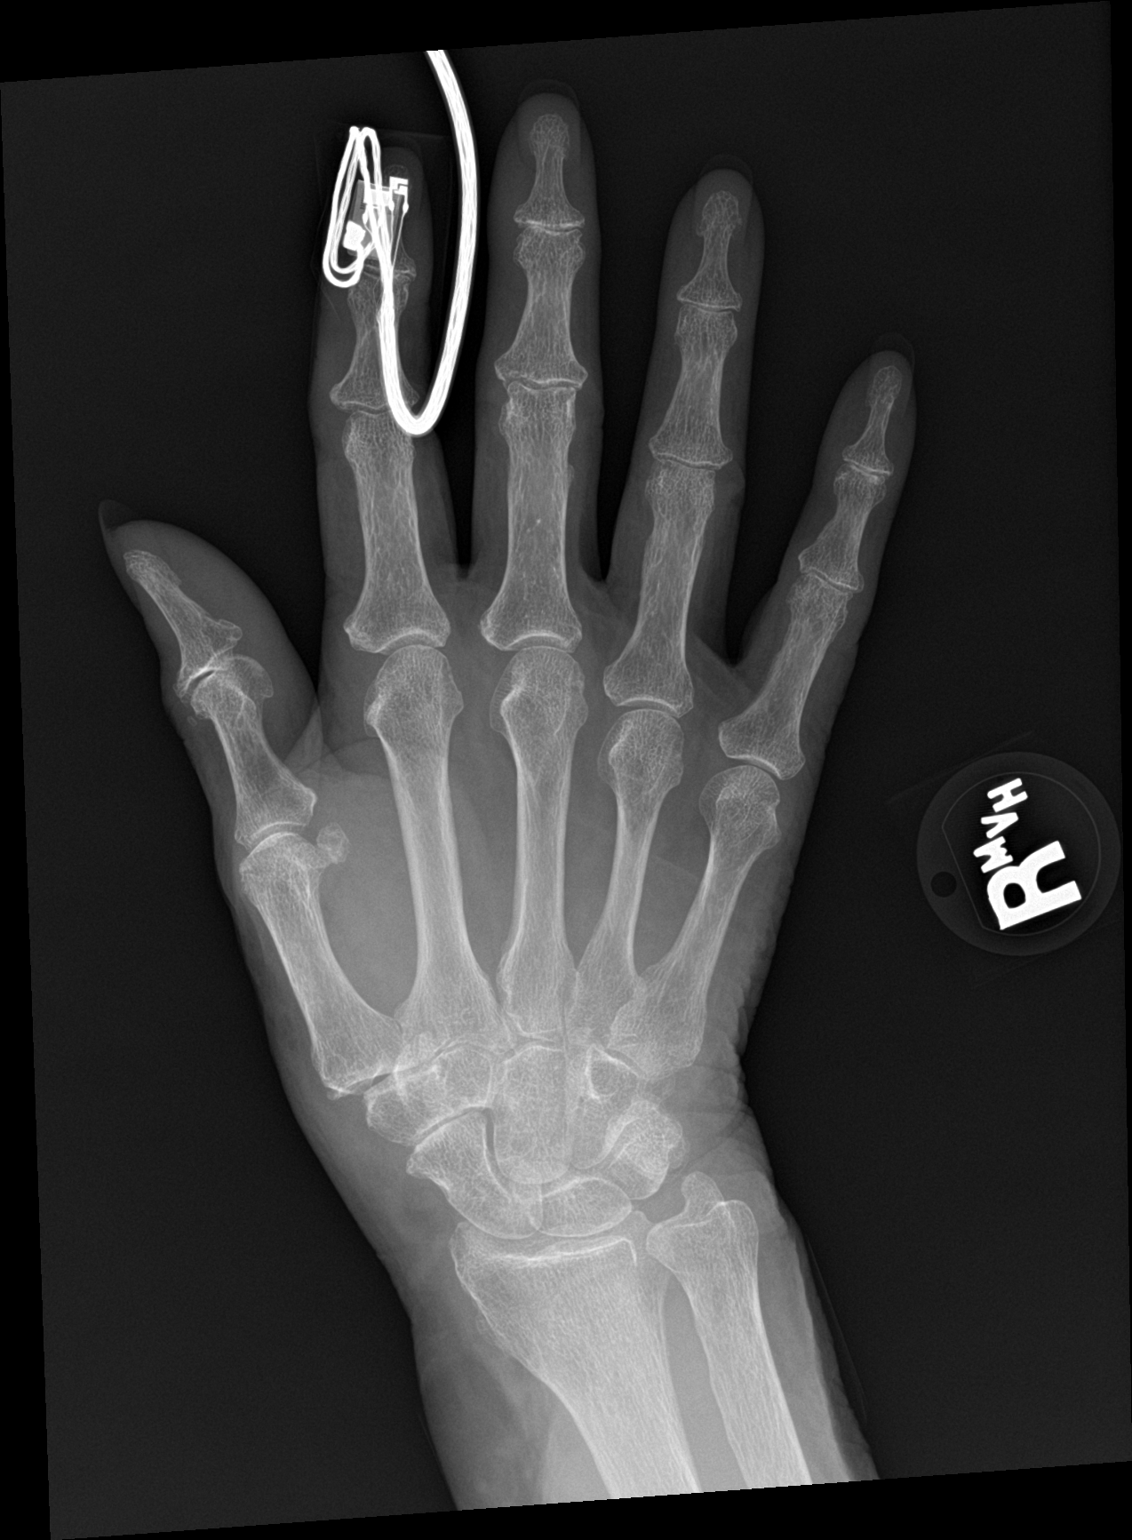

[hand obl]
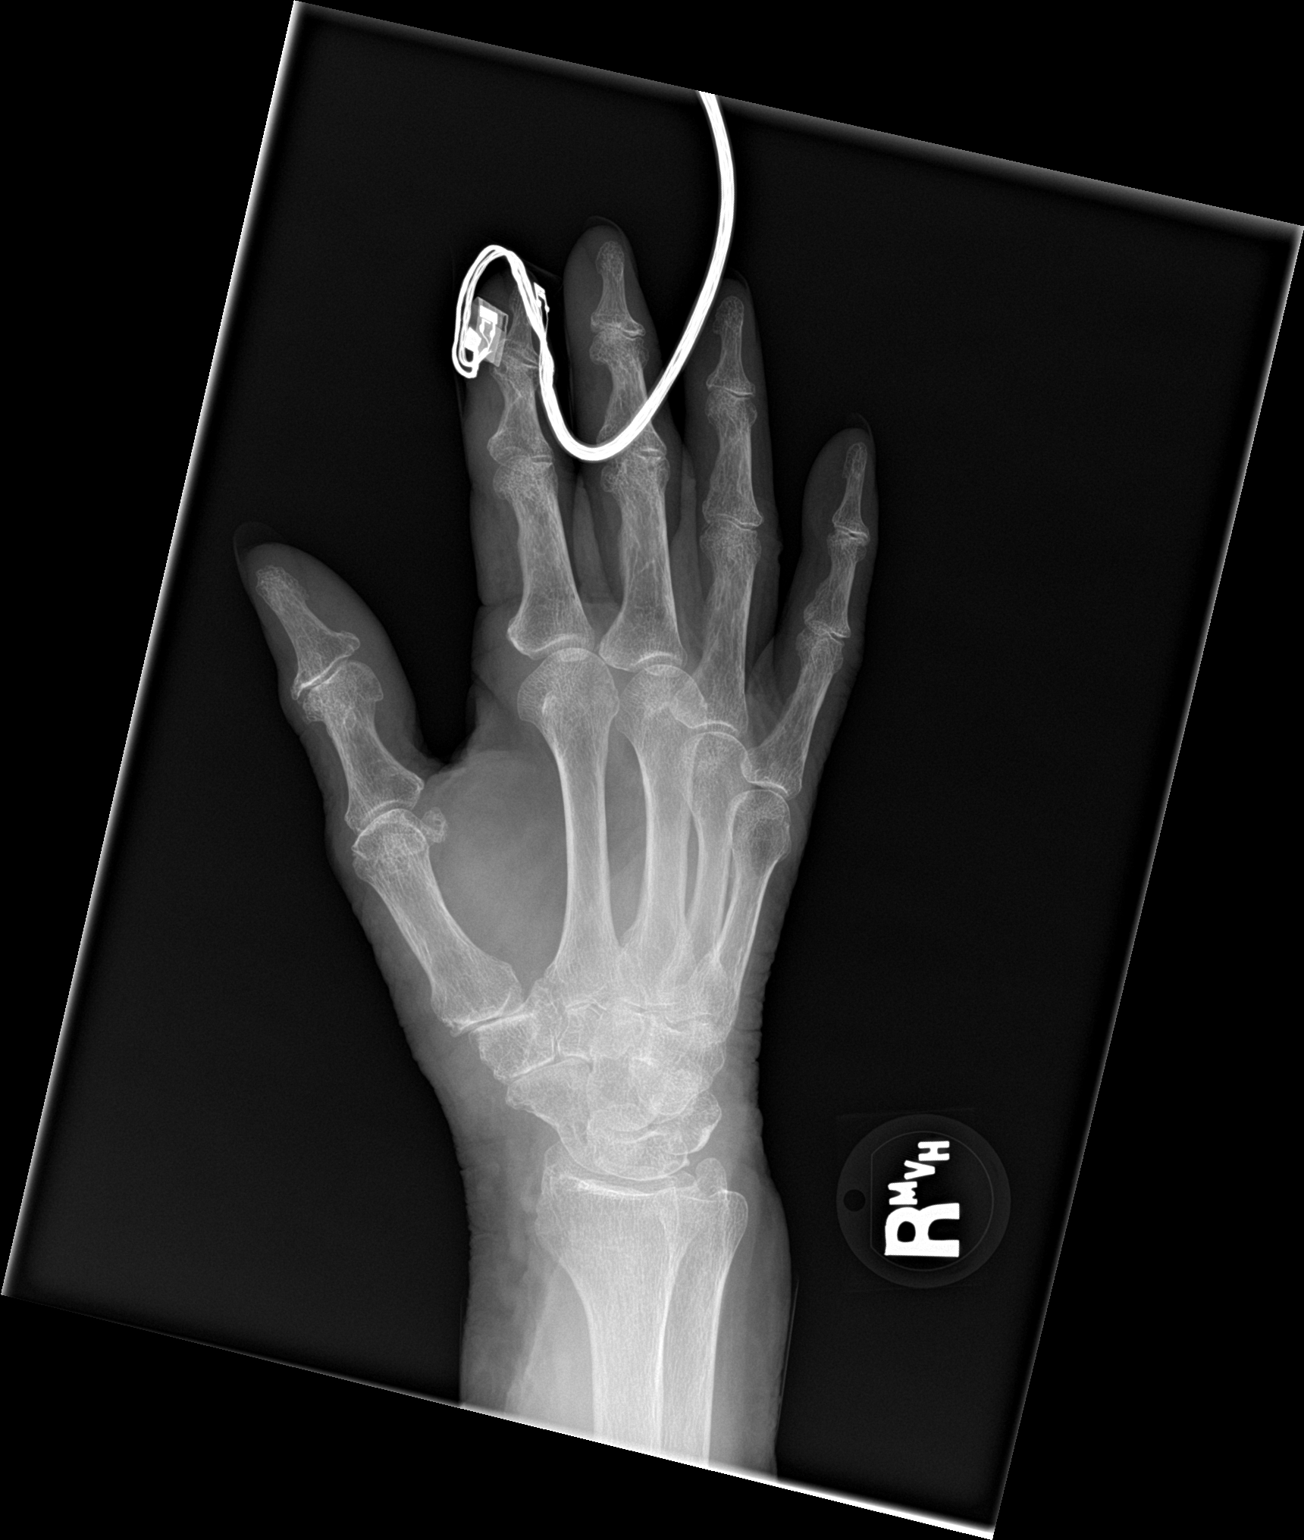

[hand lat]
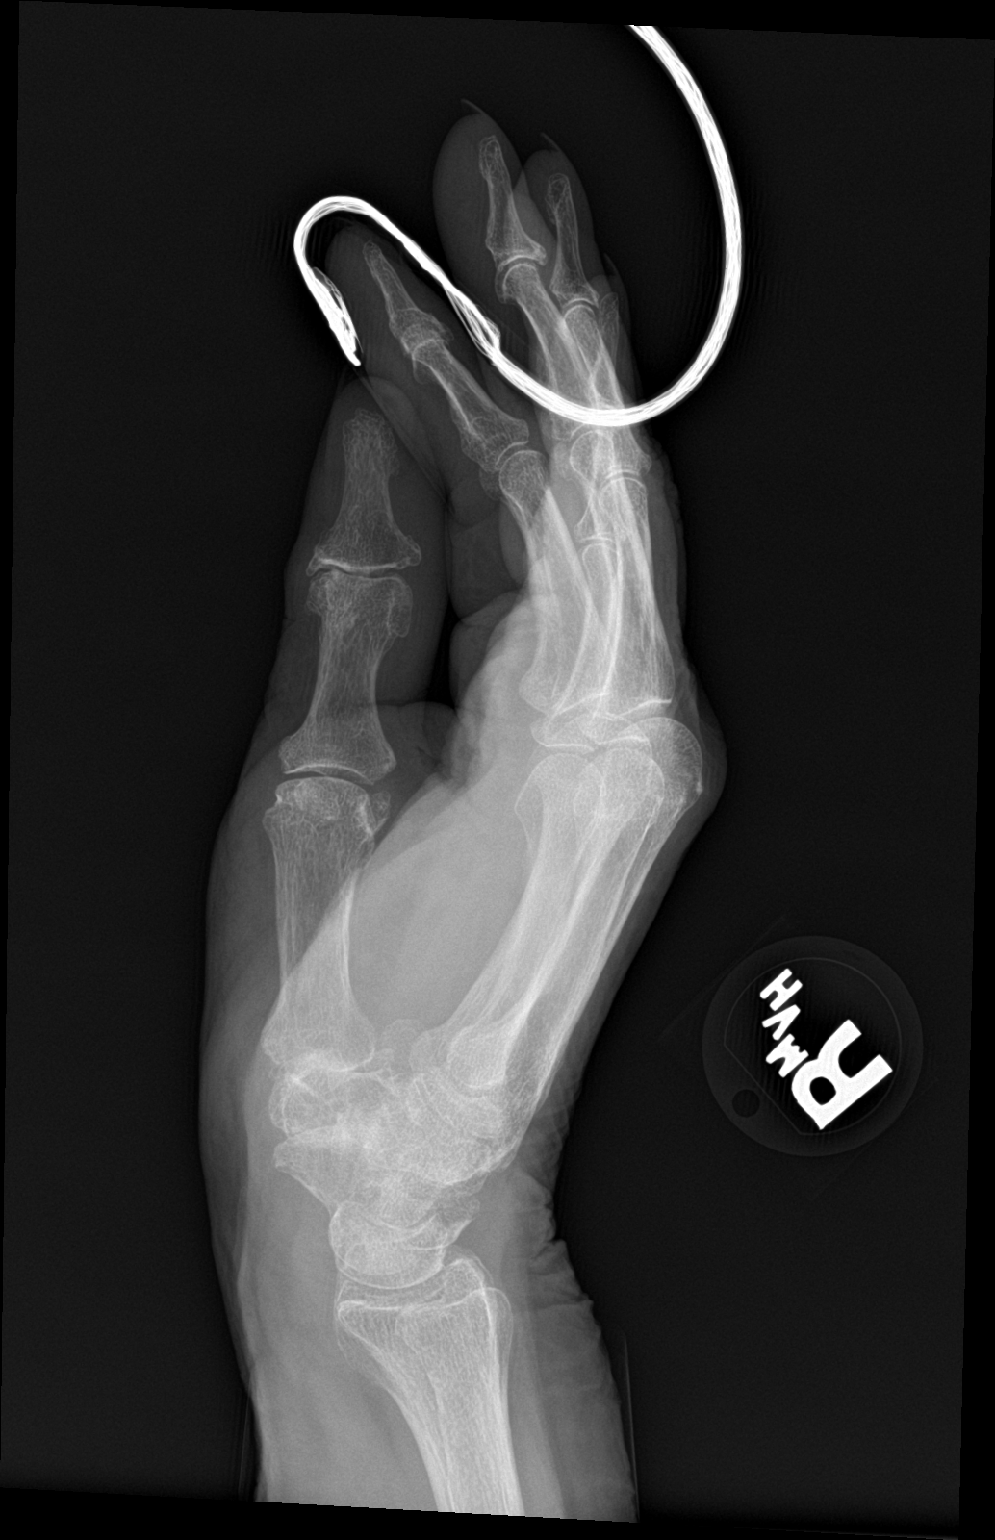

[3 of 3 positions shown; findings below may reference images not displayed]

FINDINGS: Right index finger pulse oximeter in place. Distal radius and ulna
intact. Carpal bone alignment within normal limits. Mild
osteoarthritis along the radial aspect of the carpal bones and at
the 1st CMC joint with joint space loss and subchondral sclerosis.
MCP joints are normal for age. There is IP joint osteoarthritis
diffusely. No acute osseous abnormality identified. No discrete soft
tissue abnormality.
IMPRESSION: Osteoarthritis.  No acute osseous abnormality identified.

## 2019-09-01 IMAGING — MR MRI OF THE RIGHT WRIST WITHOUT AND WITH CONTRAST
5 of 11 series · 18 of 40 positions shown · IV contrast (gadavist)
Comparison: Radiographs 08/05/2018.

CLINICAL DATA: Progressive right hand swelling with fever for 2
days. Possible septic arthritis.

EXAM:
MR OF THE RIGHT WRIST WITHOUT AND WITH CONTRAST
TECHNIQUE: Multiplanar multisequence MR imaging of the right wrist was
performed both before and after the administration of intravenous
contrast.
CONTRAST:  6 cc Gadavist

[Series 3: T2 fat-sat · axial · 3.0mm · 0.21mm/px · z∈[-25,+55]mm · 5 of 24 slices shown (1 of 2)]
[im 1/24]
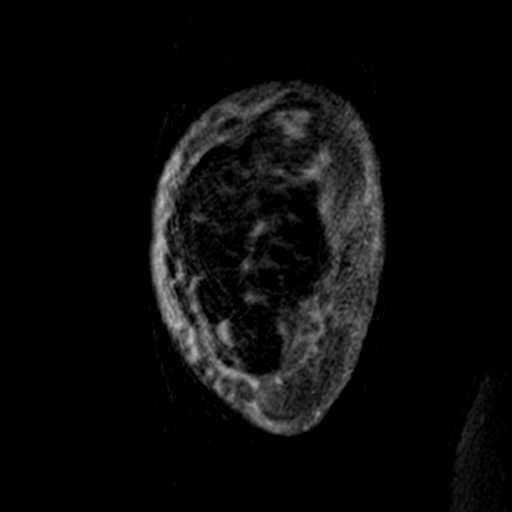
[im 6/24]
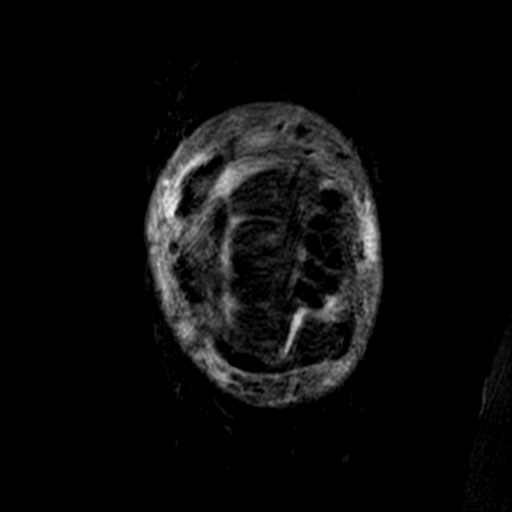
[im 12/24]
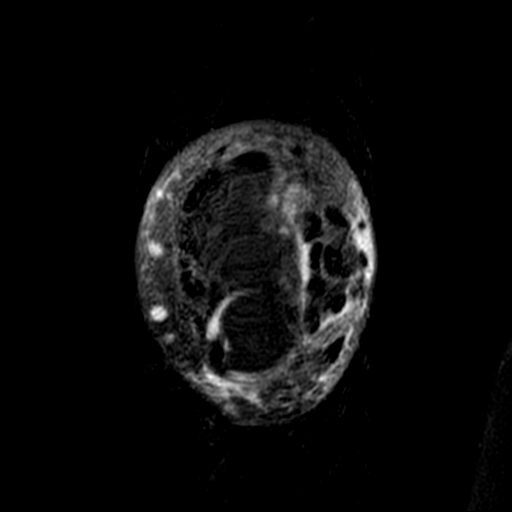
[im 18/24]
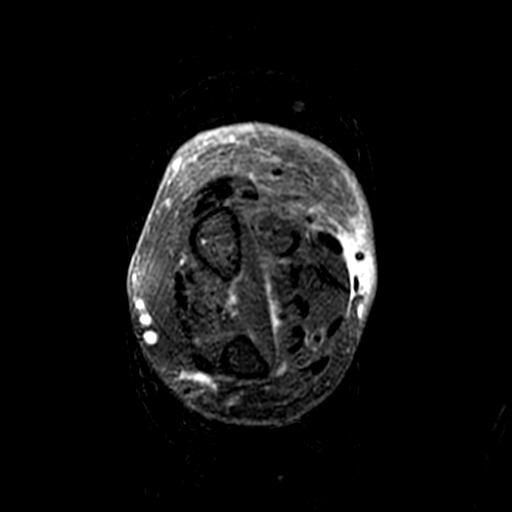
[im 24/24]
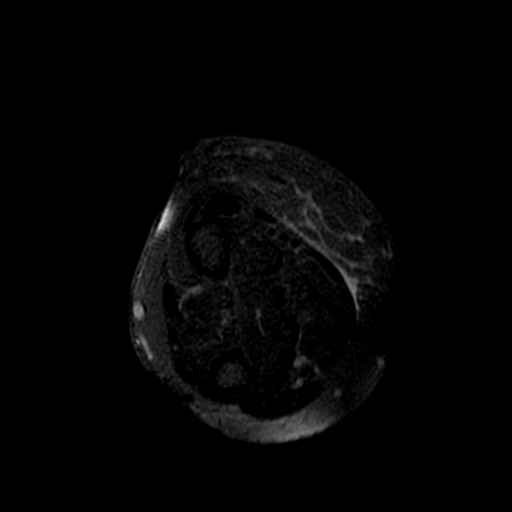

[Series 4: T1 · axial · 3.0mm · 0.21mm/px · z∈[-25,+55]mm · 5 of 24 slices shown (1 of 3)]
[im 1/24]
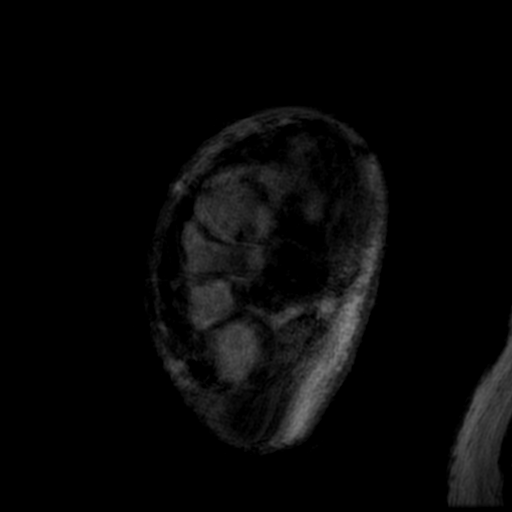
[im 6/24]
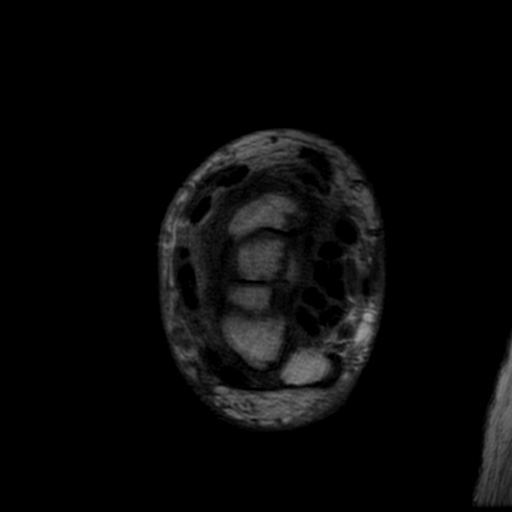
[im 12/24]
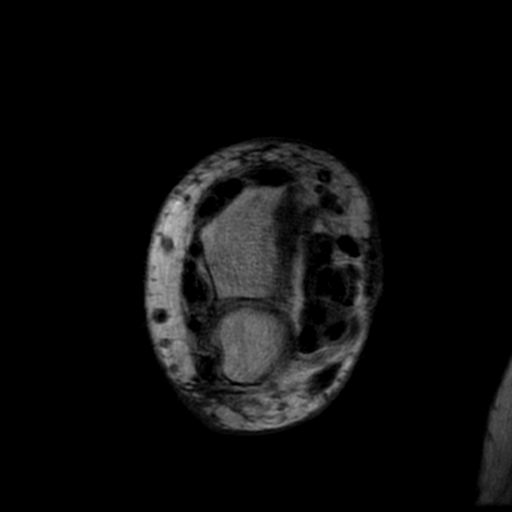
[im 18/24]
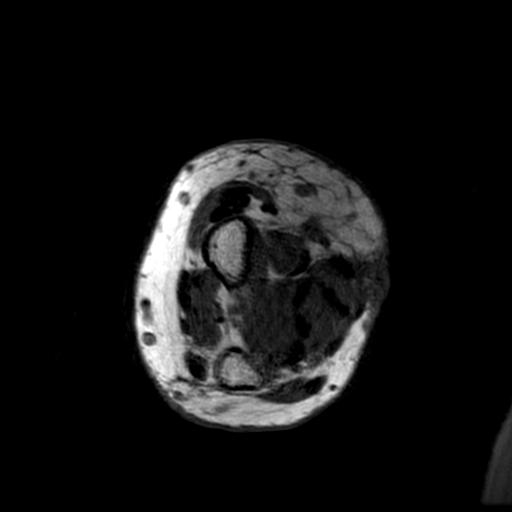
[im 24/24]
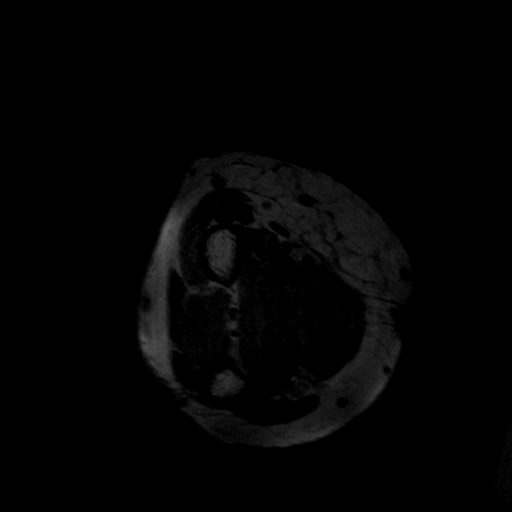

[Series 6: T1 · sagittal · 3.0mm · 0.21mm/px · 3 of 18 slices shown (2 of 3)]
[im 1/18]
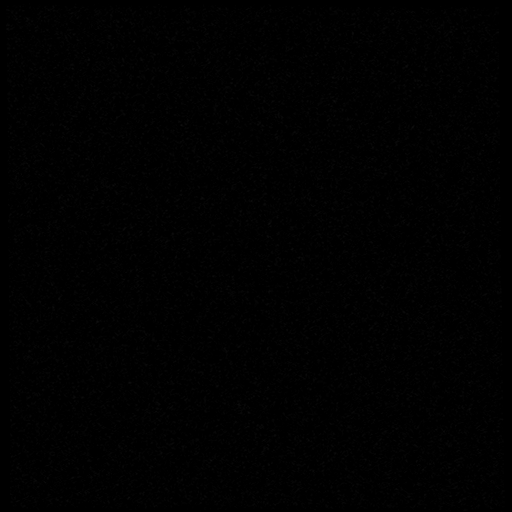
[im 9/18]
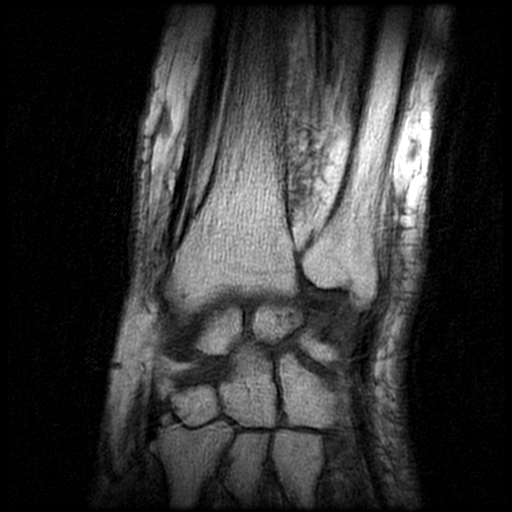
[im 18/18]
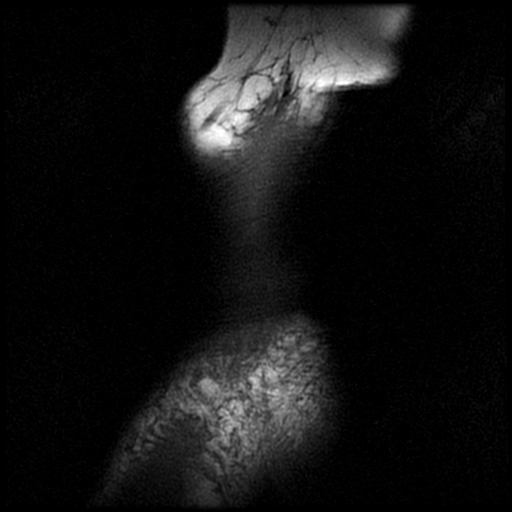

[Series 8: T2 fat-sat · sagittal · 3.0mm · 0.21mm/px · 3 of 18 slices shown (2 of 2)]
[im 1/18]
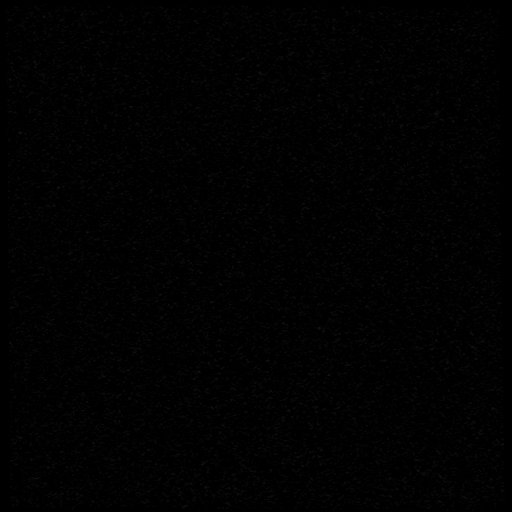
[im 9/18]
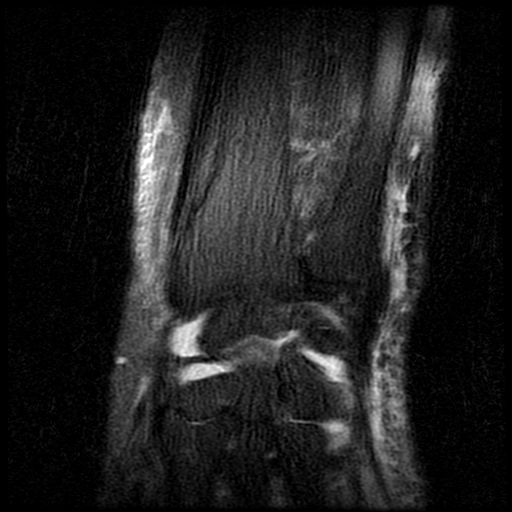
[im 18/18]
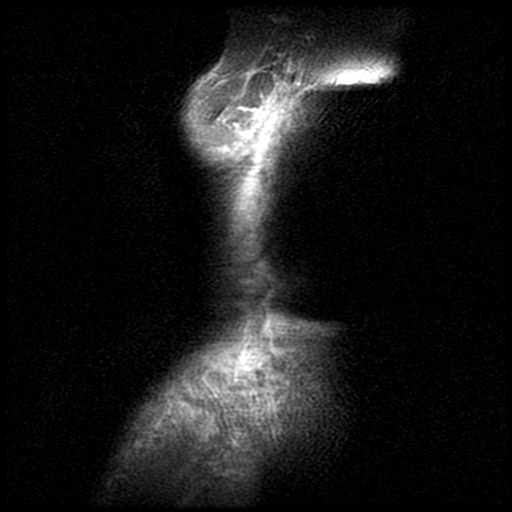

[Series 10: T1 · axial · 3.0mm · 0.21mm/px · z∈[-40,-15]mm · 2 of 24 slices shown (3 of 3)]
[im 1/24]
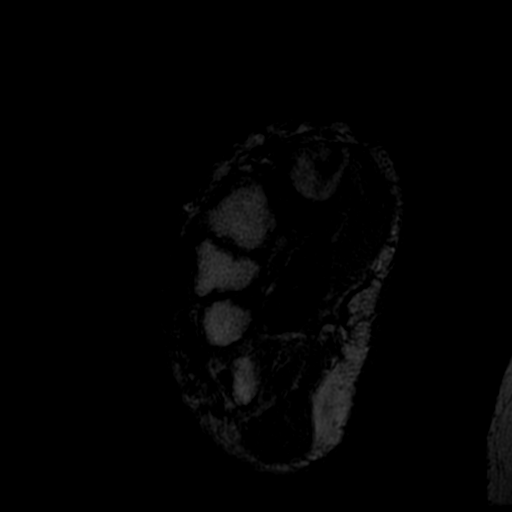
[im 8/24]
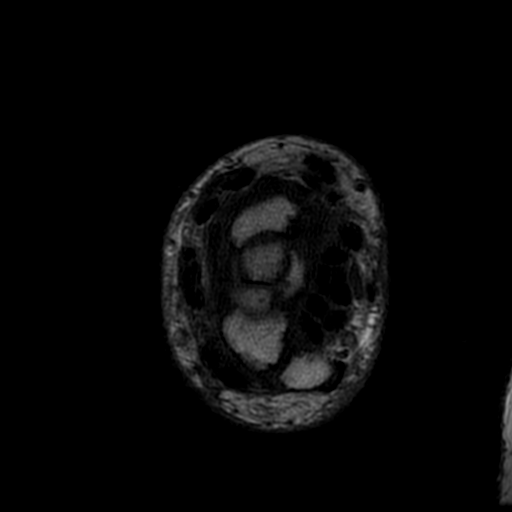

[18 of 40 positions shown; findings below may reference images not displayed]

FINDINGS: Despite efforts by the technologist and patient, moderate motion
artifact is present on today's exam and could not be eliminated.
This reduces exam sensitivity and specificity.

Ligaments: Ligamentous evaluation is limited by the motion. The
scapholunate and lunotriquetral ligaments are grossly intact. No
corresponding diastasis.

Triangular fibrocartilage: The triangular fibrocartilage complex
appears intact. The ulnar variance is neutral.

Tendons: The flexor and extensor tendons are intact. No significant
tenosynovitis or abnormal enhancement associated with the tendon
sheaths.

Carpal tunnel/median nerve: Suboptimally evaluated due to motion. No
gross abnormality.

Guyon's canal: Suboptimally evaluated due to motion. No gross
abnormality.

Joint/cartilage: There are small to moderate radiocarpal and
midcarpal compartment effusions with diffuse synovial enhancement
following contrast.Similar findings are present at the 1st
carpometacarpal joint.

Bones/carpal alignment: There are scattered erosions throughout the
carpal bones and distal radius with associated enhancement following
contrast. No cortical destruction identified.The carpal bone
alignment appears normal.

Other: There is soft tissue edema and enhancement surrounding the
wrist. No focal fluid collections are seen.
IMPRESSION: 1. The study is moderately motion degraded, limiting intra-articular
detail.
2. Radiocarpal, midcarpal and 1st carpometacarpal joint effusions
with diffuse enhancement consistent with synovitis. Scattered
erosions throughout the carpal bones and distal radius. These
findings support an inflammatory process. Differential
considerations include rheumatoid arthritis and septic arthritis.
3. Periarticular soft tissue edema and enhancement without focal
fluid collection.
4. No frank osteomyelitis identified.

## 2019-09-01 IMAGING — CR RIGHT WRIST - COMPLETE 3+ VIEW
5 series · 5 of 5 positions shown · non-contrast
Comparison: 08/03/2018

CLINICAL DATA: Wrist pain

EXAM:
RIGHT WRIST - COMPLETE 3+ VIEW

[x wrist pa right]
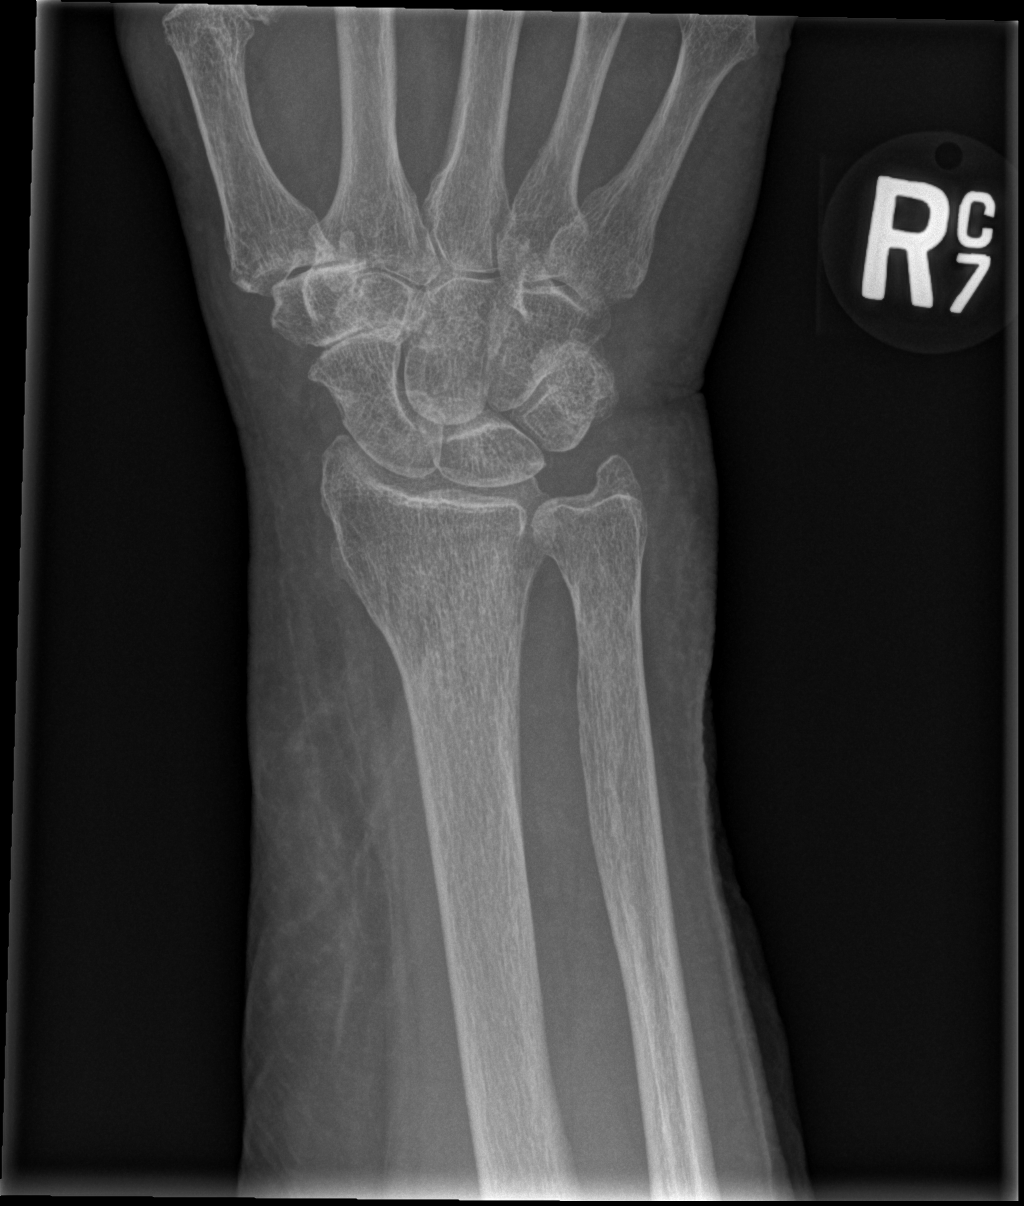

[x wrist obl right (1 of 2)]
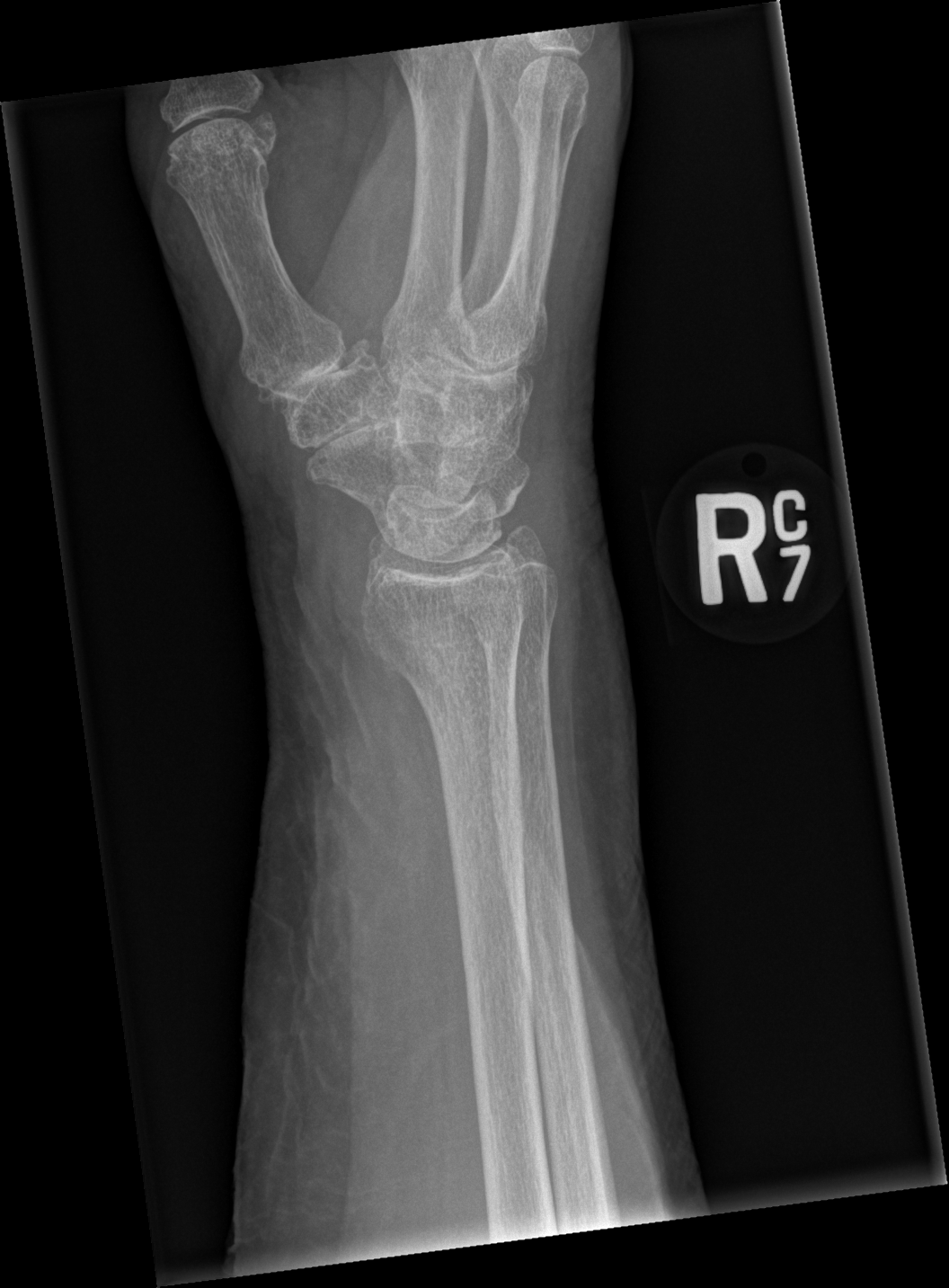

[x wrist obl right (2 of 2)]
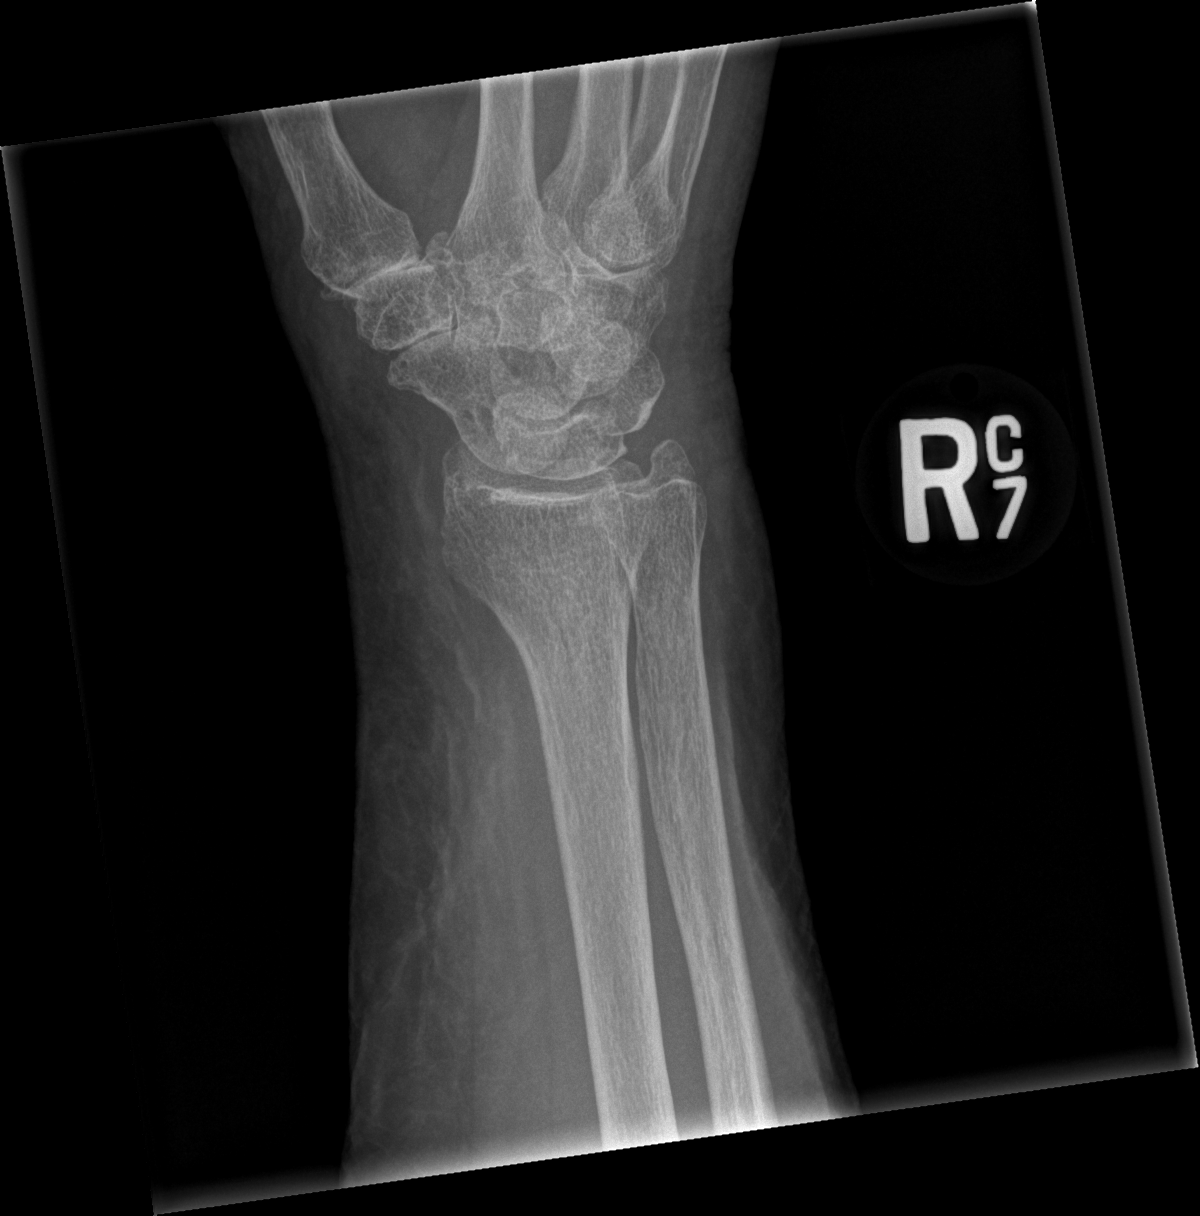

[x wrist lat right]
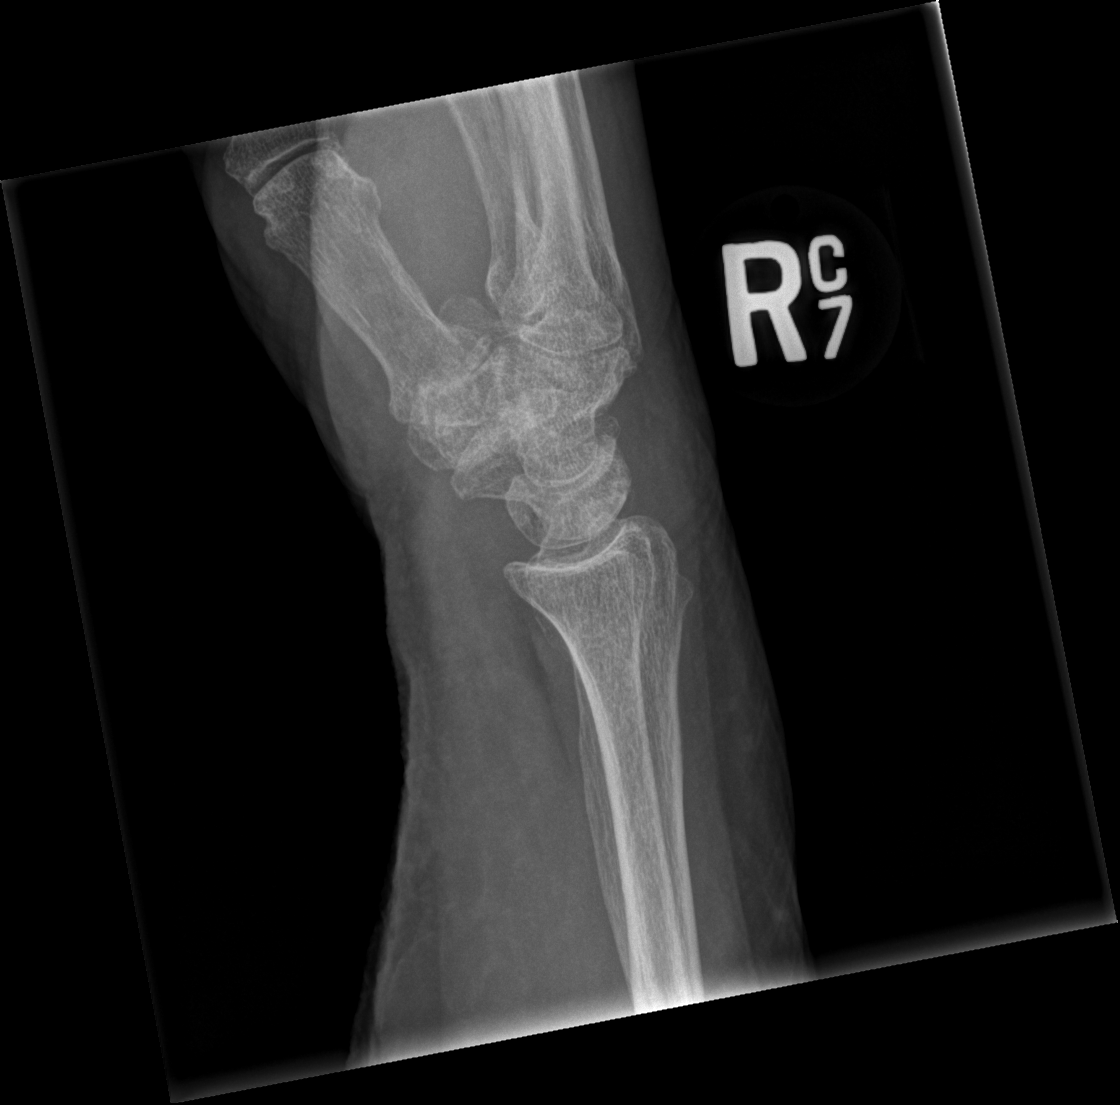

[x wrist navicular view right]
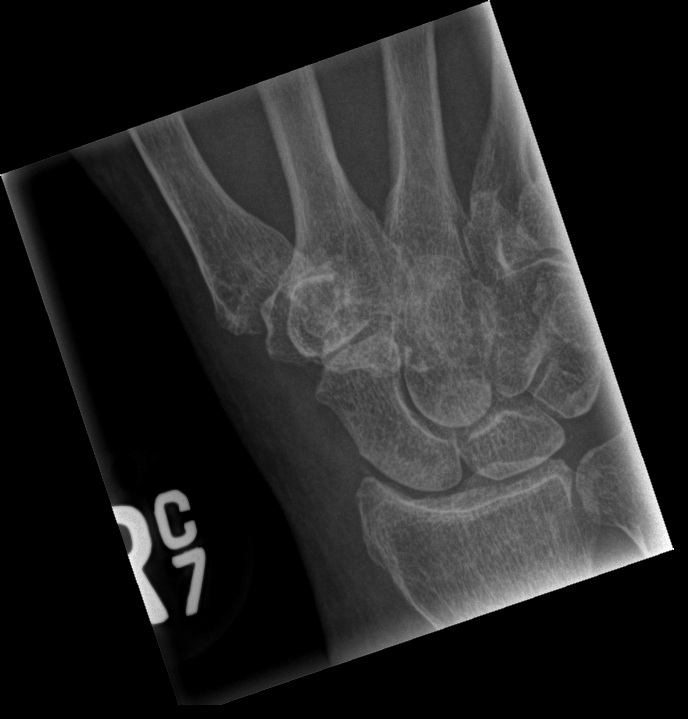

[5 of 5 positions shown; findings below may reference images not displayed]

FINDINGS: Again noted are findings of osteoarthritis, greatest at the first
carpometacarpal joint. There is soft tissue swelling about the wrist
without evidence of an acute displaced fracture or dislocation.
There is no radiopaque foreign body.
IMPRESSION: No acute osseous abnormality. Nonspecific soft tissue swelling about
the wrist.

## 2019-11-01 IMAGING — CR DG HIP (WITH OR WITHOUT PELVIS) 2-3V RIGHT
3 series · 3 of 3 positions shown · non-contrast
Comparison: None.

CLINICAL DATA: Right hip pain.

EXAM:
DG HIP (WITH OR WITHOUT PELVIS) 2-3V RIGHT

[t pelvis ap]
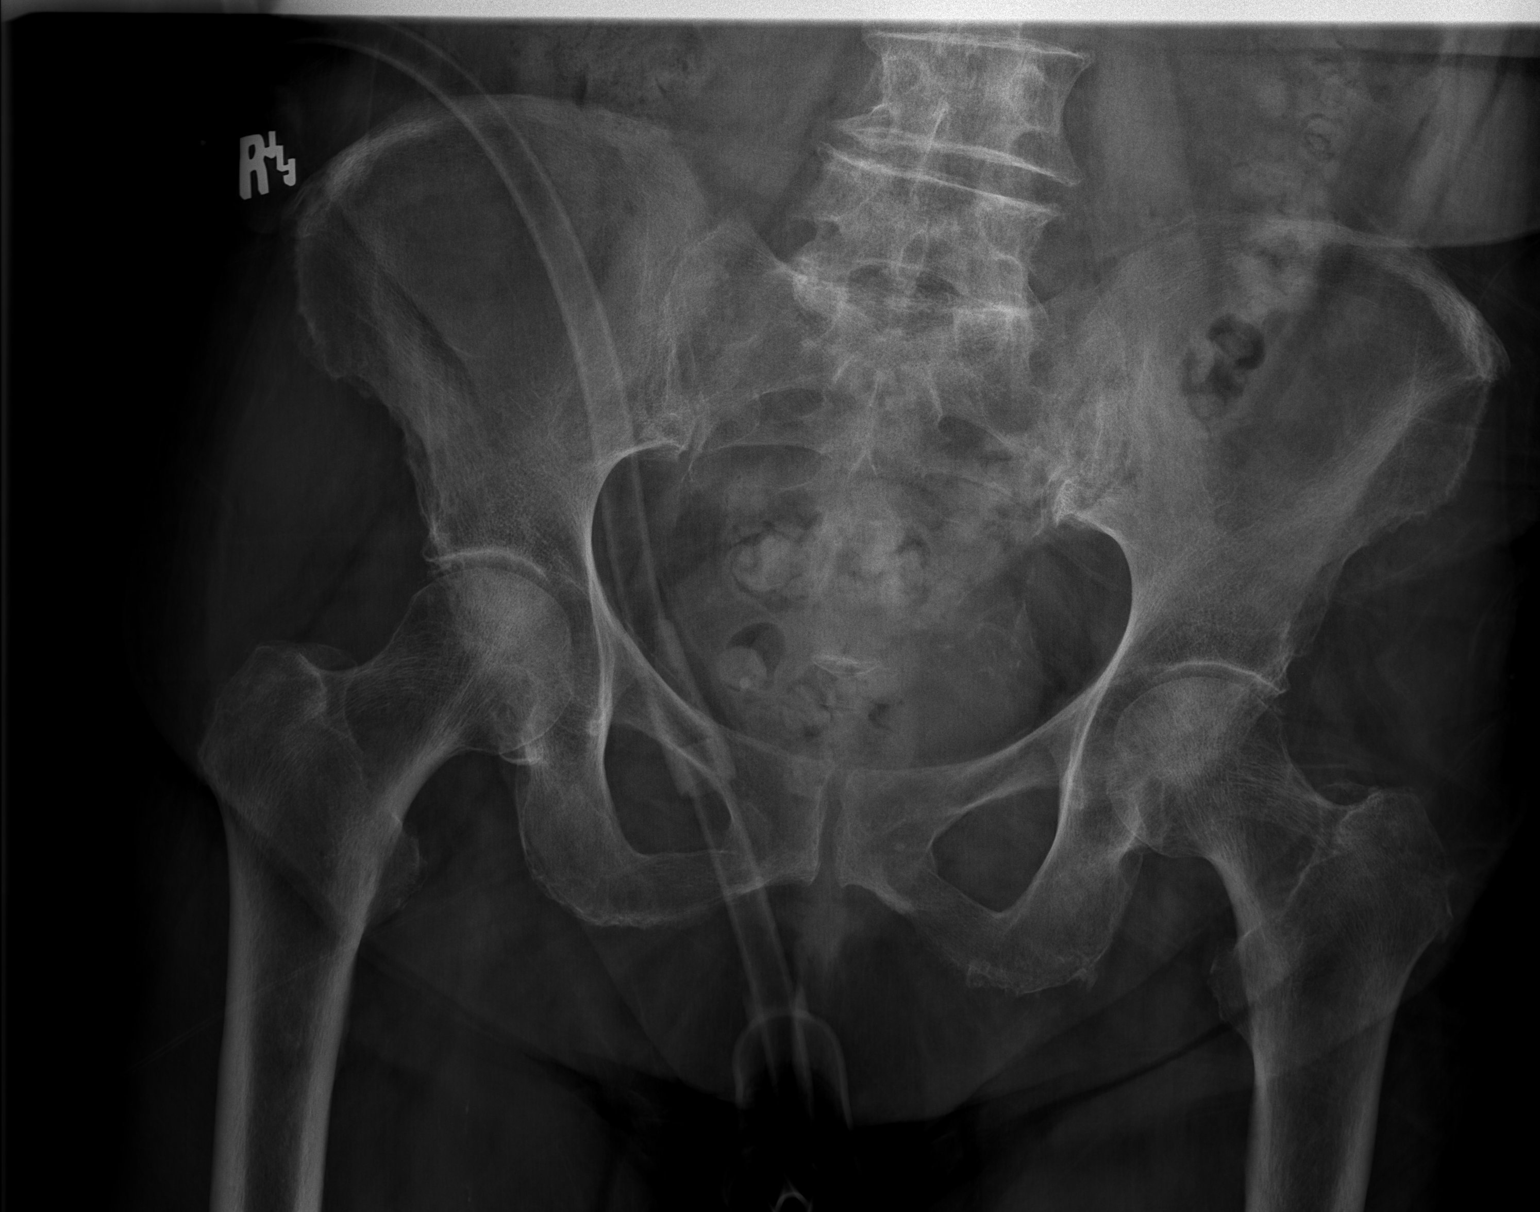

[t hip ap right]
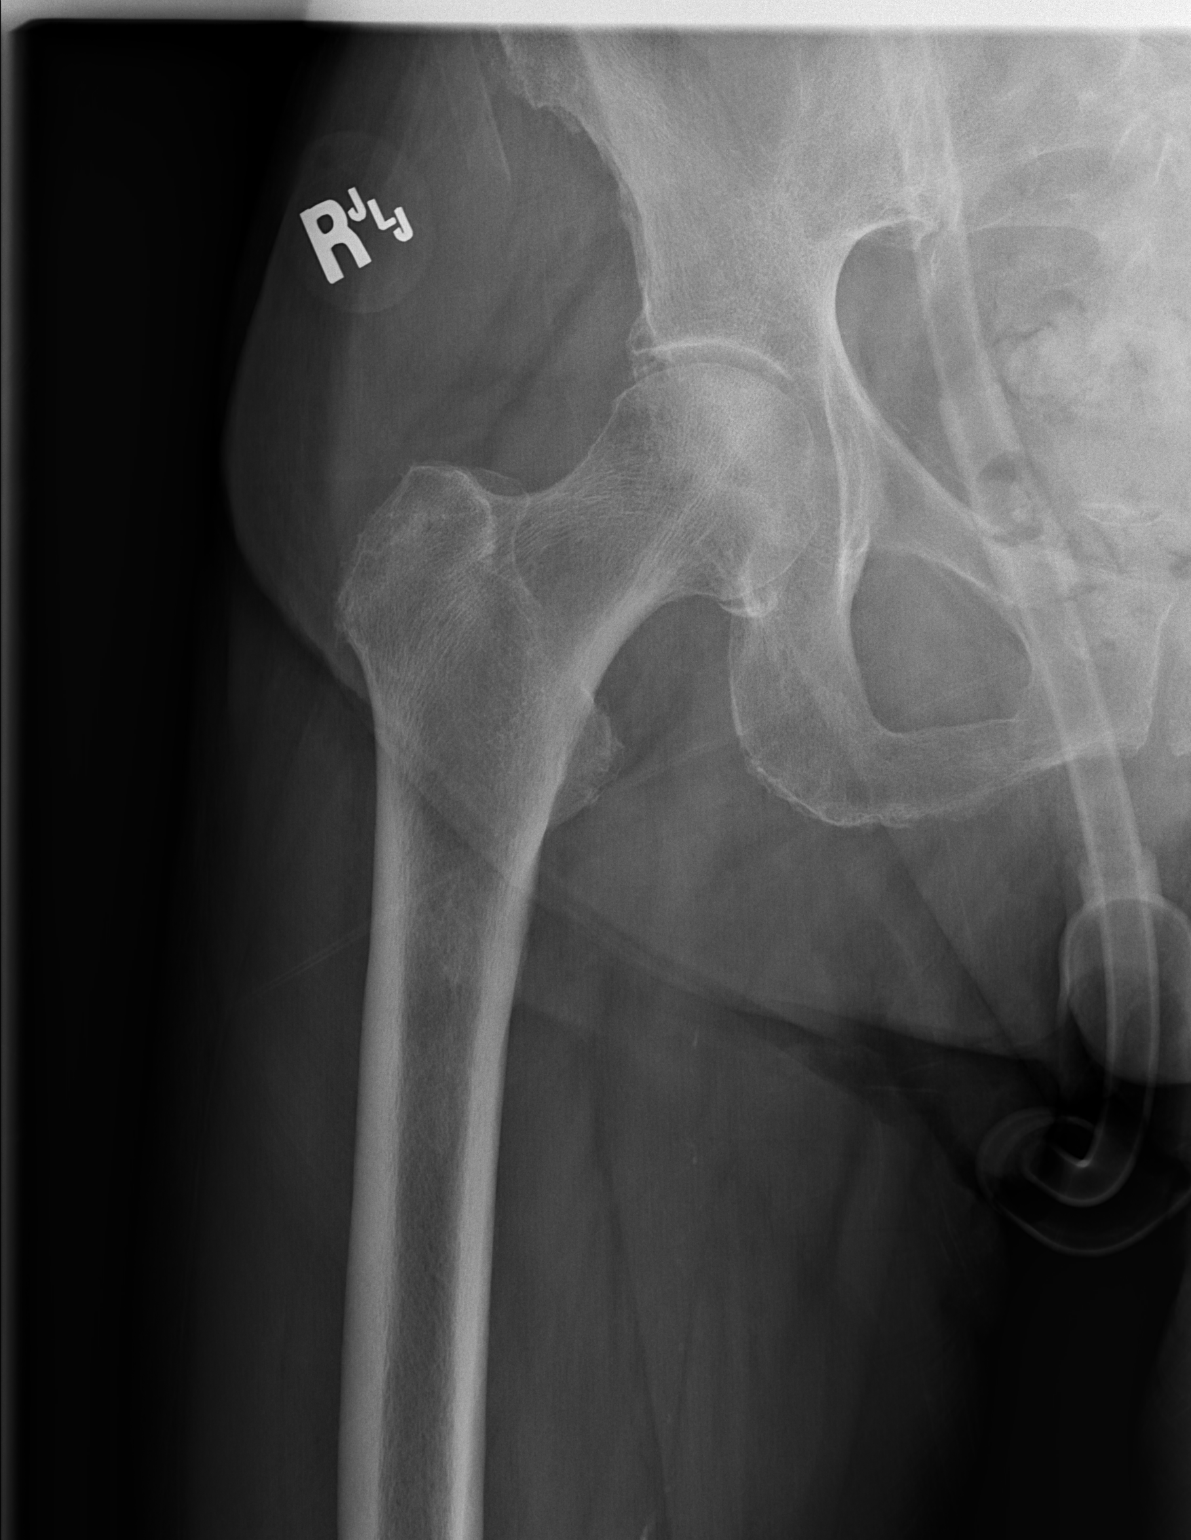

[t hip frog leg right]
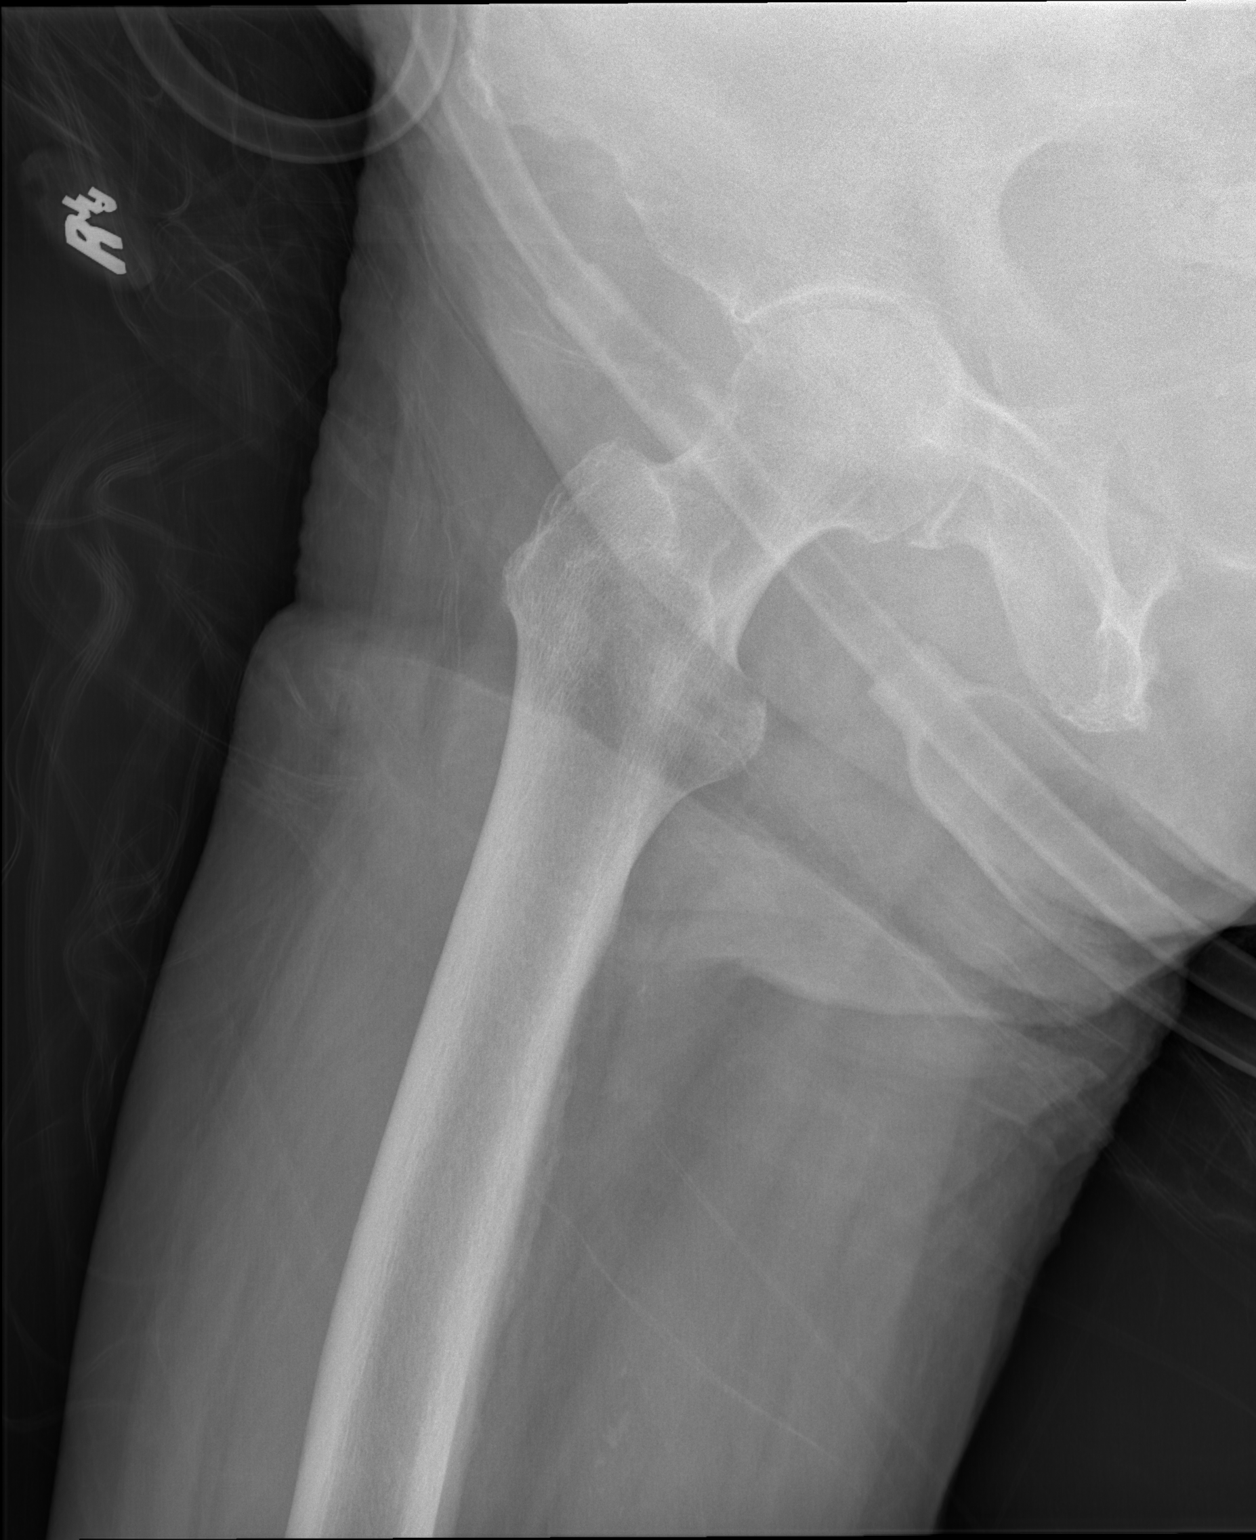

[3 of 3 positions shown; findings below may reference images not displayed]

FINDINGS: The cortical margins of the bony pelvis and right hip are intact. No
fracture. Pubic symphysis and sacroiliac joints are congruent. Both
femoral heads are well-seated in the respective acetabula. Age
related acetabular spurring bilaterally.
IMPRESSION: Mild degenerative change for age.  No acute osseous abnormality.

## 2019-11-01 IMAGING — MR MR LUMBAR SPINE WO/W CM
4 of 8 series · 18 of 48 positions shown · IV contrast (Yes)
Comparison: Lumbar spine radiographs 10/05/2018. CT lumbar spine,
abdomen and pelvis 03/31/2018

CLINICAL DATA: Right hip pain radiating into groin. No known
injury.

EXAM:
MRI LUMBAR SPINE WITHOUT AND WITH CONTRAST
TECHNIQUE: Multiplanar and multiecho pulse sequences of the lumbar spine were
obtained without and with intravenous contrast.
CONTRAST:  7 cc Gadavist

[Series 3: T1 · sagittal · 4.0mm · 0.51mm/px · 3 of 14 slices shown]
[im 1/14]
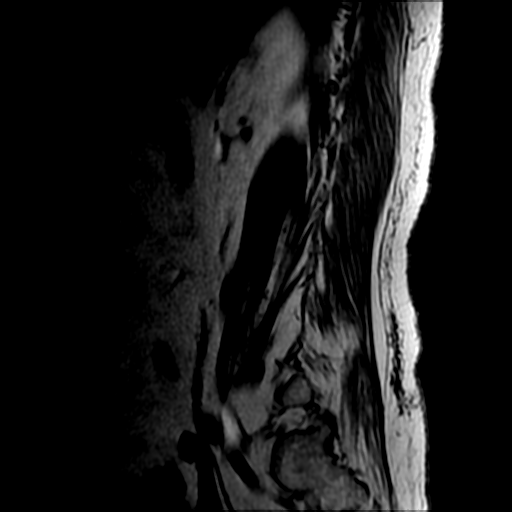
[im 7/14]
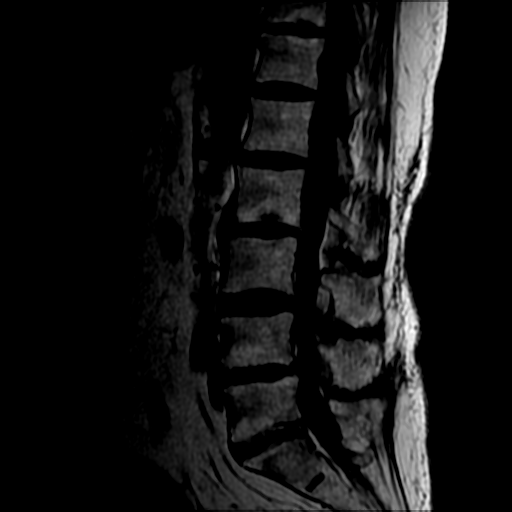
[im 14/14]
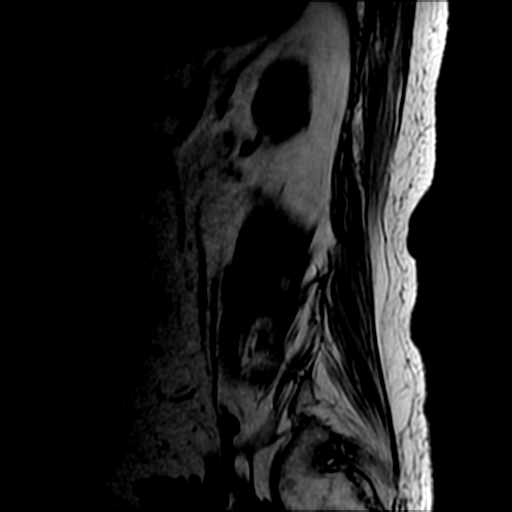

[Series 4: T2 post-contrast · sagittal · 4.0mm · 0.51mm/px · 3 of 14 slices shown (1 of 2)]
[im 1/14]
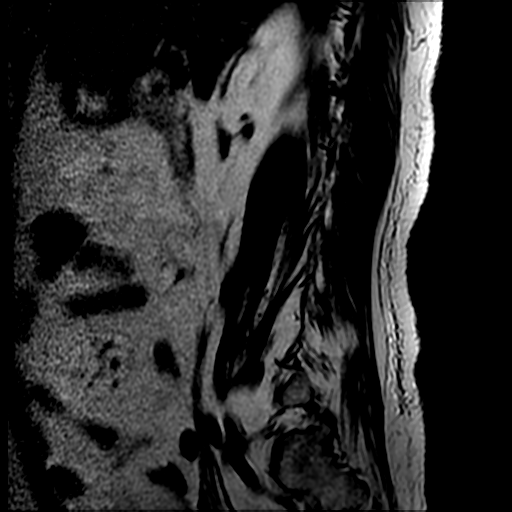
[im 7/14]
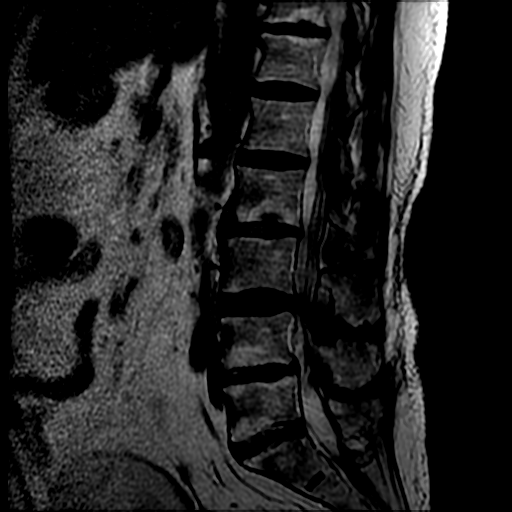
[im 14/14]
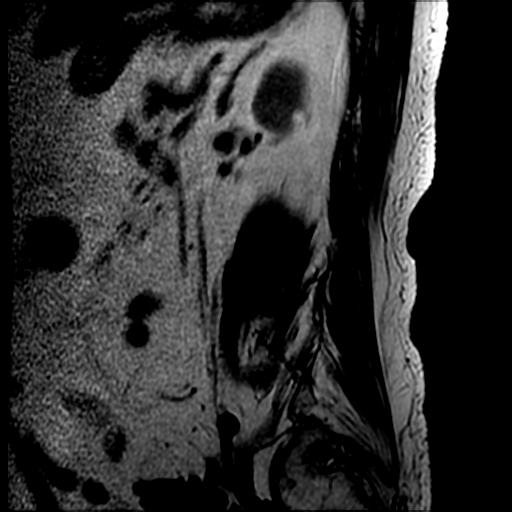

[Series 6: T2 · axial · 4.0mm · 0.39mm/px · z∈[-79,+127]mm · 8 of 37 slices shown]
[im 1/37]
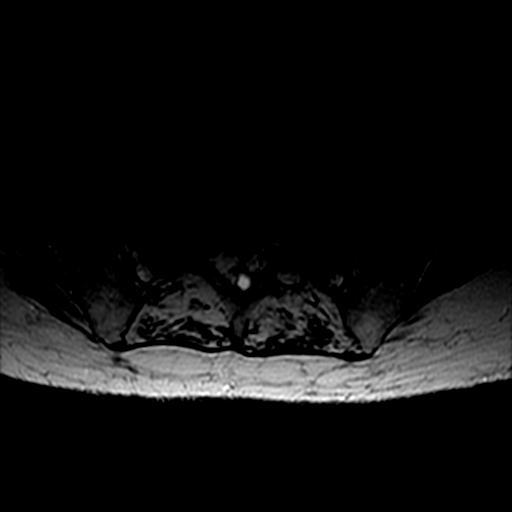
[im 5/37]
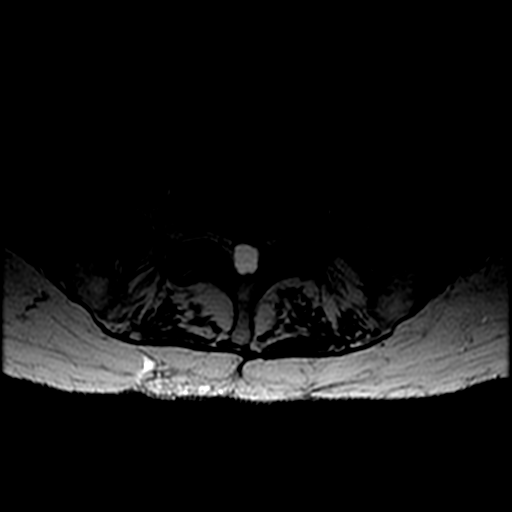
[im 13/37]
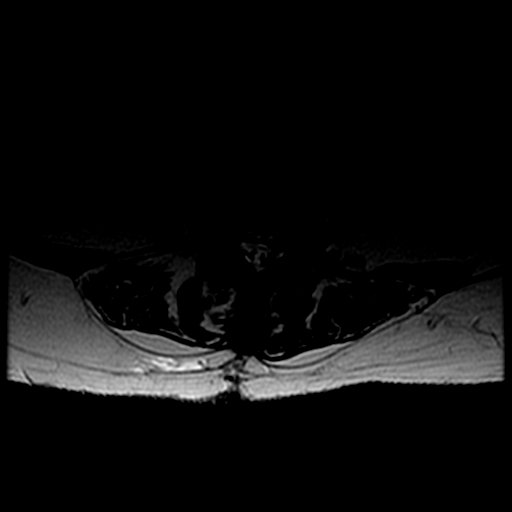
[im 17/37]
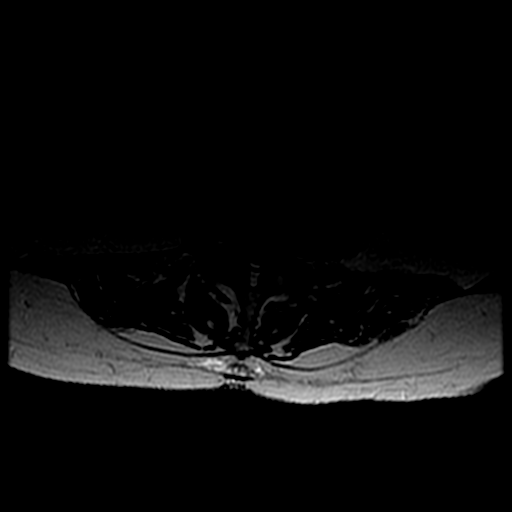
[im 21/37]
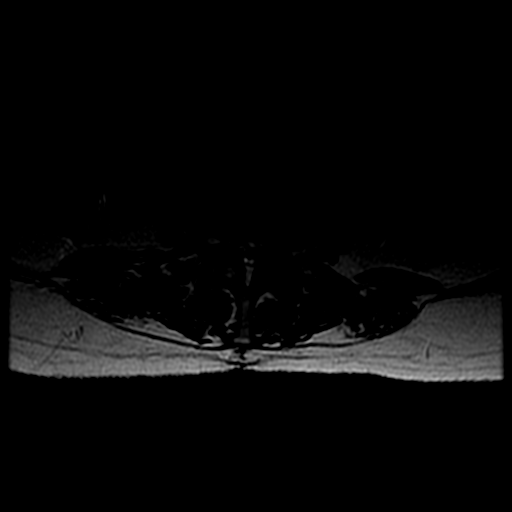
[im 25/37]
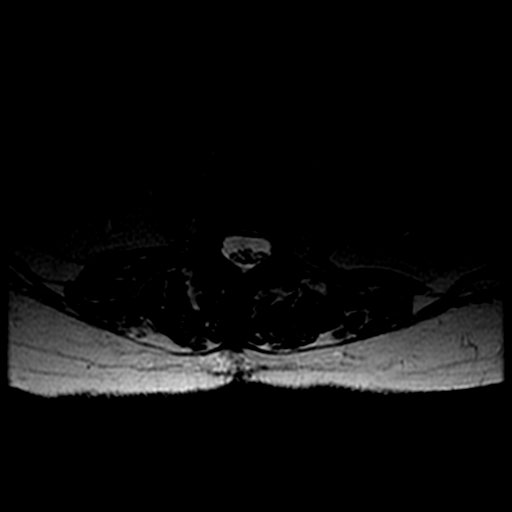
[im 33/37]
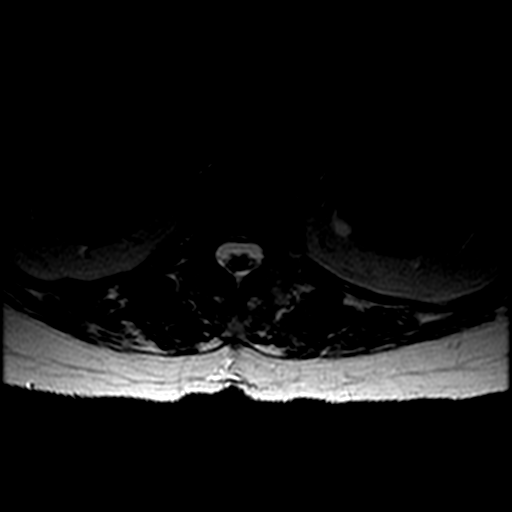
[im 37/37]
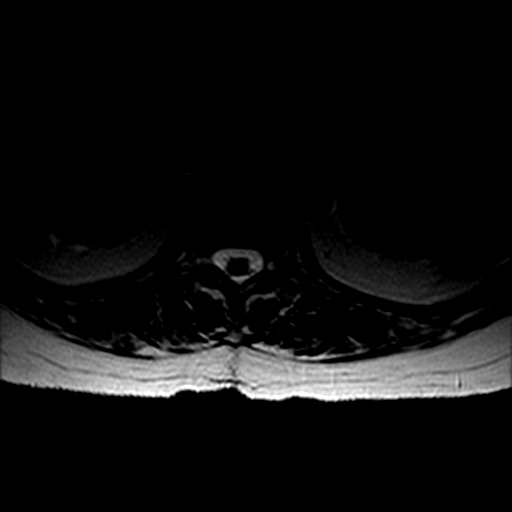

[Series 8: T2 post-contrast · sagittal · 4.0mm · 0.51mm/px · 4 of 14 slices shown (2 of 2)]
[im 1/14]
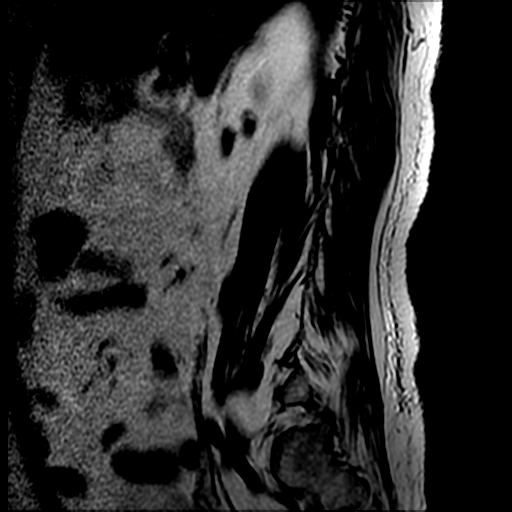
[im 5/14]
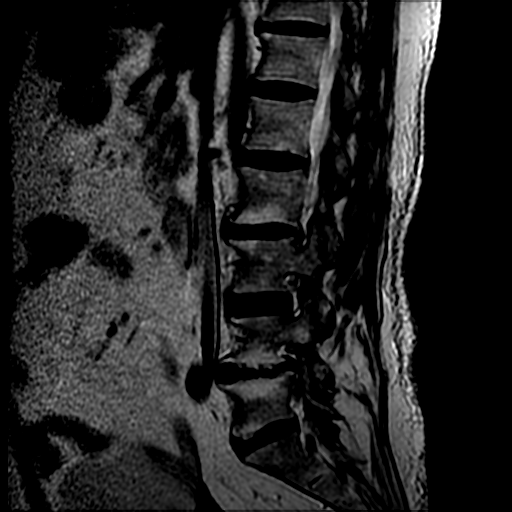
[im 9/14]
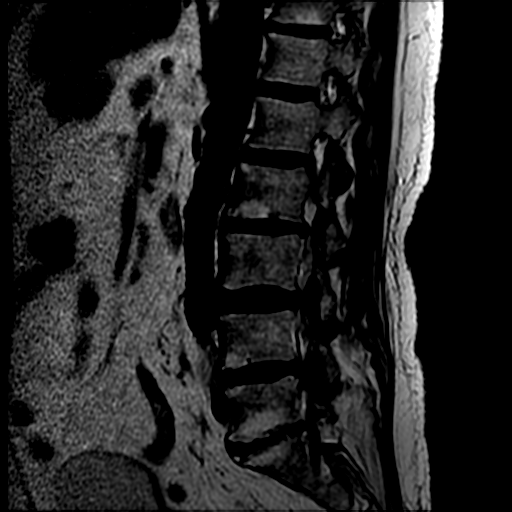
[im 14/14]
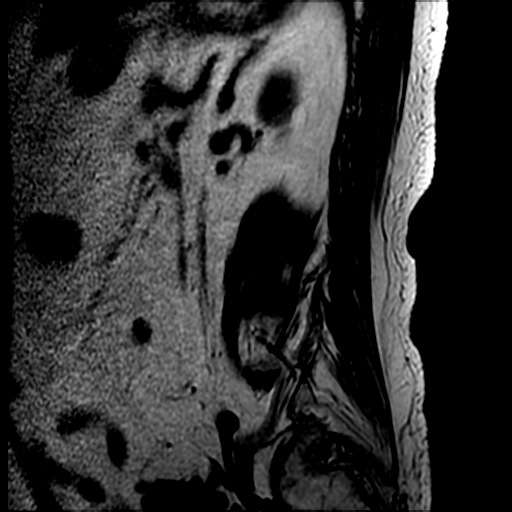

[18 of 48 positions shown; findings below may reference images not displayed]

FINDINGS: Segmentation: Conventional anatomy assumed, with the last open disc
space designated L5-S1.Concordant with previous imaging.

Alignment: Mild straightening and mild convex left scoliosis,
stable.

Vertebrae: No worrisome osseous lesion, acute fracture or pars
defect. There are chronic endplate degenerative changes at L2-3,
L4-5 and L5-S1. No abnormal marrow enhancement. The lumbar pedicles
are somewhat short on a congenital basis.The visualized sacroiliac
joints appear unremarkable.

Conus medullaris: Extends to the L2 level and appears normal. No
abnormal intradural enhancement.

Paraspinal and other soft tissues: No significant paraspinal
findings. The urinary bladder is moderately distended, similar to
previous studies. There are small left renal cysts.

Disc levels:

No significant disc space findings from T11-12 through L1-2.

L2-3: Loss of disc height with annular disc bulging and endplate
osteophytes. Mild facet and ligamentous hypertrophy. There is mild
narrowing of the lateral recesses. The foramina are patent.

L3-4: Disc height is relatively preserved. Annular disc bulging,
facet and ligamentous hypertrophy superimposed on congenital factors
contribute to moderate multifactorial spinal stenosis with mild
narrowing of the lateral recesses and foramina bilaterally.

L4-5: Chronic degenerative disc disease with annular disc bulging
and endplate osteophytes asymmetric to the right. Facet hypertrophy
is also asymmetric to the right and contributes to moderate right
foraminal narrowing. There is mild narrowing of the lateral
recesses, left greater than right.

L5-S1: Remote laminectomy defect on the left. Chronic loss of disc
height with endplate osteophytes asymmetric to the left and
bilateral facet hypertrophy. Mild left foraminal narrowing appears
stable.
IMPRESSION: 1. No acute findings or clear explanation for the patient's
symptoms.
2. Multilevel spondylosis, similar to previous CTs from 03/31/2018.
3. Moderate multifactorial spinal stenosis at L3-4 with mild
narrowing of the lateral recesses and foramina.
4. Chronic right foraminal narrowing at L4-5 with potential for
chronic right L4 nerve root encroachment.
5. Postsurgical changes on the left at L5-S1.

## 2019-11-01 IMAGING — CR LUMBAR SPINE - COMPLETE 4+ VIEW
5 series · 5 of 5 positions shown · non-contrast
Comparison: Lumbar spine CT 03/31/2018

CLINICAL DATA: Lumbosacral back pain.

EXAM:
LUMBAR SPINE - COMPLETE 4+ VIEW

[t lumbar spine ap]
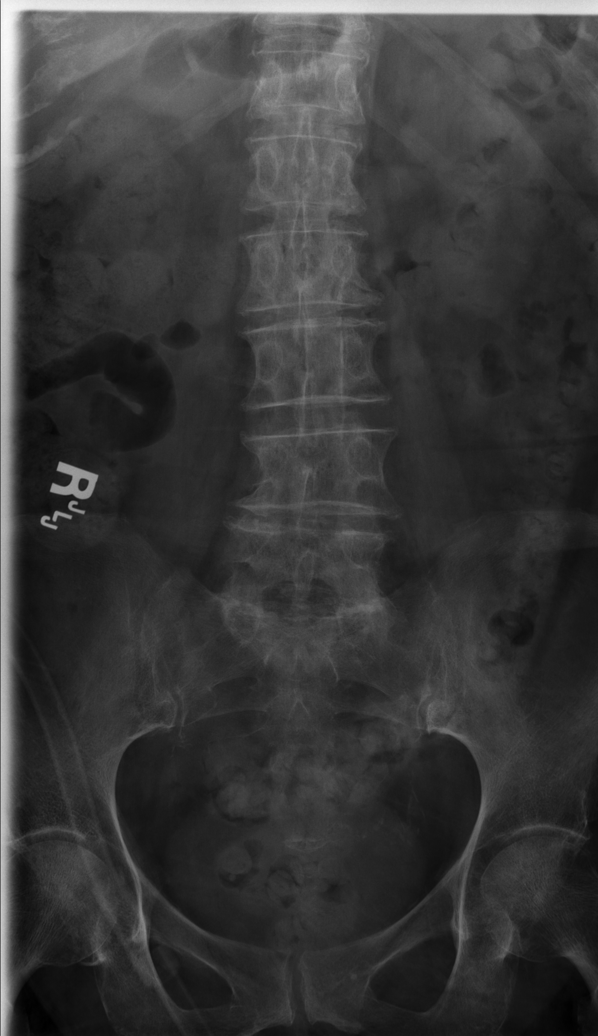

[t lumbar spine obl (1 of 2)]
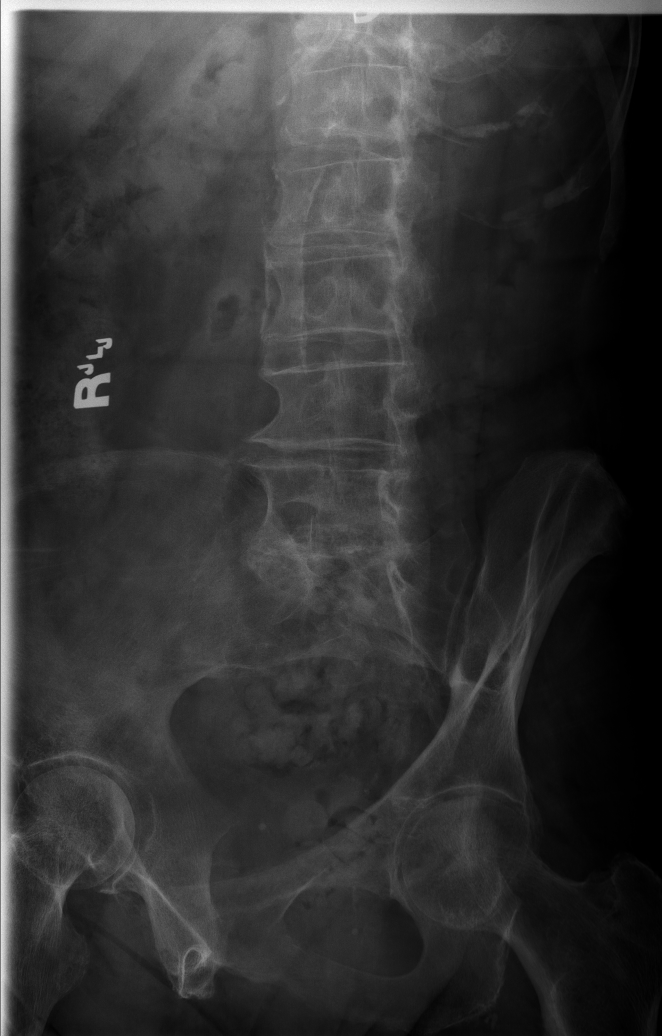

[t lumbar spine obl (2 of 2)]
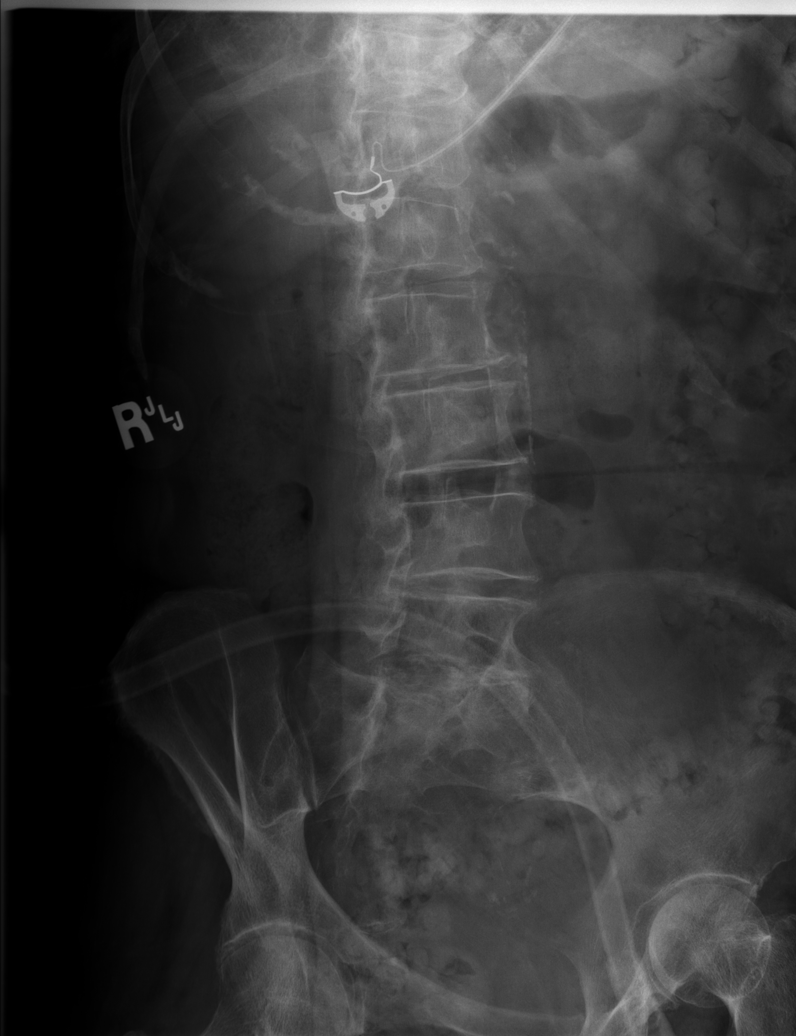

[t lumbar spine lat]
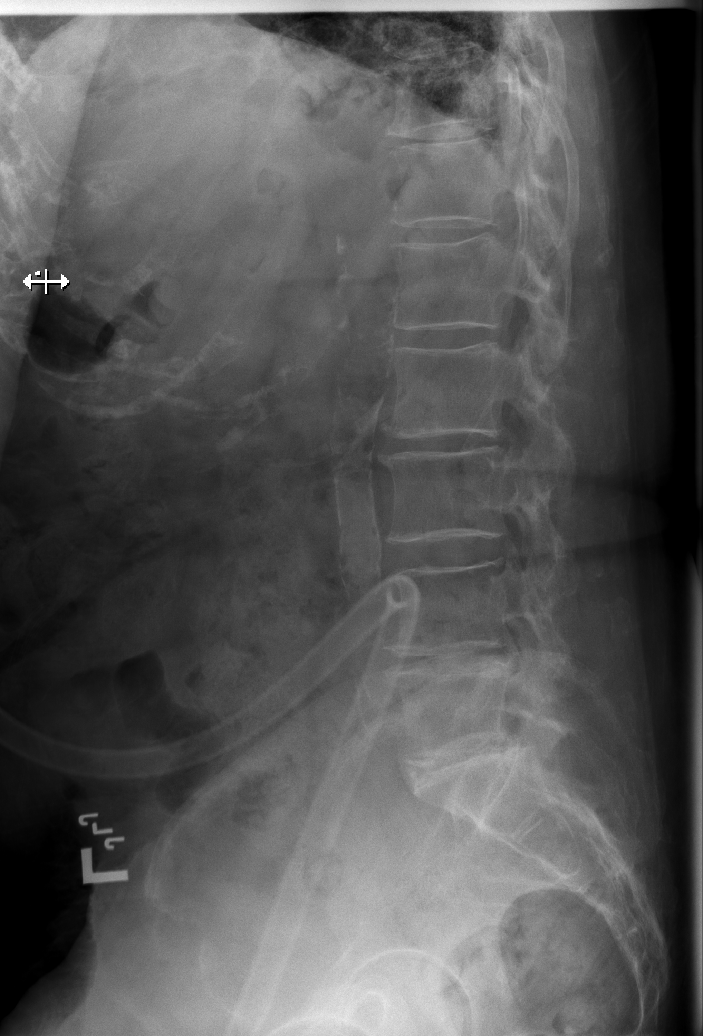

[t lumbar l-5 s-1 spot]
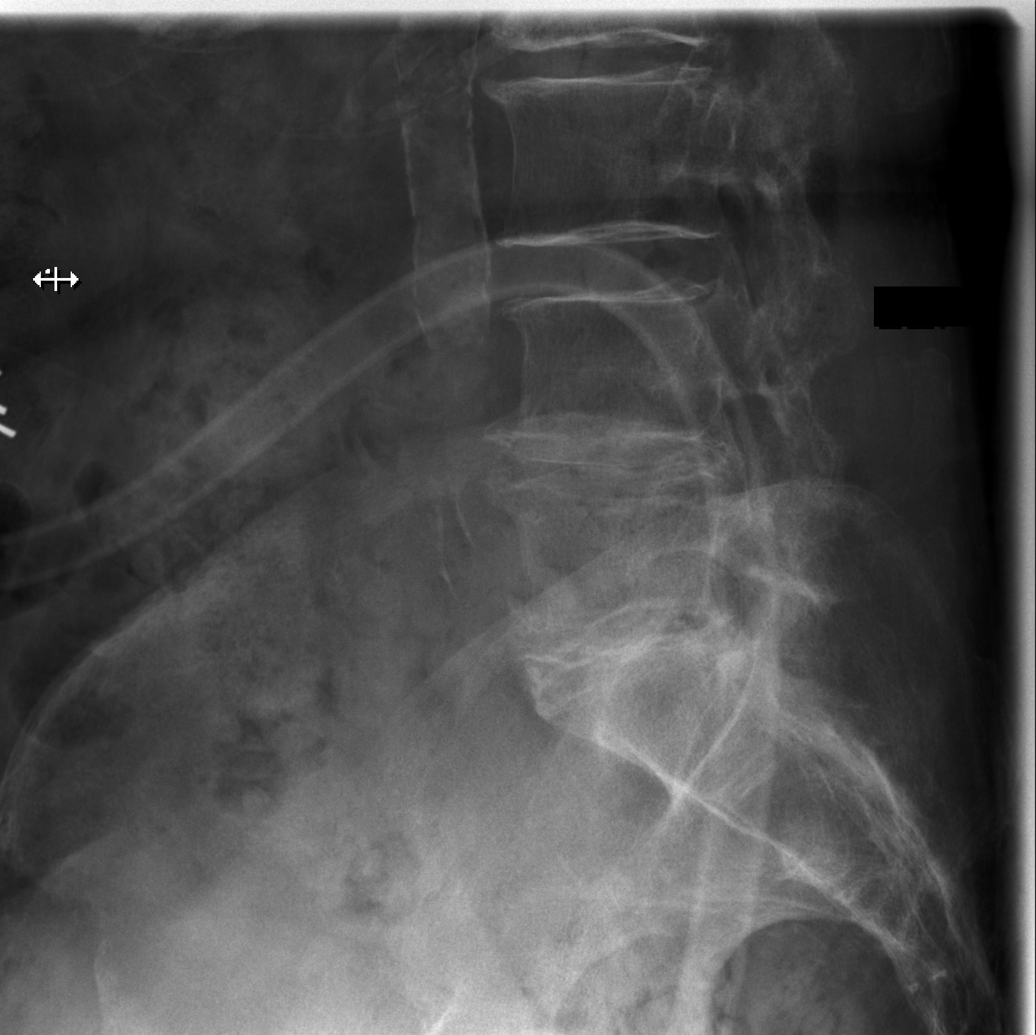

[5 of 5 positions shown; findings below may reference images not displayed]

FINDINGS: The alignment is maintained, similar straightening to prior exam.
Vertebral body heights are normal. There is no listhesis. Disc space
narrowing and endplate spurring at L4-L5, L5-S1, and L2-L3.
Multilevel facet hypertrophy. The posterior elements are intact. No
fracture. Sacroiliac joints are symmetric and normal. Aortic
atherosclerosis.
IMPRESSION: Multilevel degenerative disc disease and facet arthropathy
throughout the lumbar spine. No acute osseous abnormality.

## 2019-12-16 ENCOUNTER — Other Ambulatory Visit (INDEPENDENT_AMBULATORY_CARE_PROVIDER_SITE_OTHER): Payer: Medicare Other

## 2019-12-16 ENCOUNTER — Ambulatory Visit (INDEPENDENT_AMBULATORY_CARE_PROVIDER_SITE_OTHER): Payer: Medicare Other | Admitting: Physician Assistant

## 2019-12-16 ENCOUNTER — Encounter: Payer: Self-pay | Admitting: Physician Assistant

## 2019-12-16 VITALS — BP 104/60 | HR 54 | Ht 64.0 in | Wt 135.0 lb

## 2019-12-16 DIAGNOSIS — R197 Diarrhea, unspecified: Secondary | ICD-10-CM

## 2019-12-16 DIAGNOSIS — R1030 Lower abdominal pain, unspecified: Secondary | ICD-10-CM

## 2019-12-16 LAB — BASIC METABOLIC PANEL
BUN: 22 mg/dL (ref 6–23)
CO2: 29 mEq/L (ref 19–32)
Calcium: 9.3 mg/dL (ref 8.4–10.5)
Chloride: 102 mEq/L (ref 96–112)
Creatinine, Ser: 0.94 mg/dL (ref 0.40–1.20)
GFR: 51.35 mL/min — ABNORMAL LOW (ref >60.00)
Glucose, Bld: 116 mg/dL — ABNORMAL HIGH (ref 70–99)
Potassium: 4.4 mEq/L (ref 3.5–5.1)
Sodium: 139 mEq/L (ref 135–145)

## 2019-12-16 LAB — CBC WITH DIFFERENTIAL/PLATELET
Basophils Absolute: 0.1 10*3/uL (ref 0.0–0.1)
Basophils Relative: 1.2 % (ref 0.0–3.0)
Eosinophils Absolute: 0.3 10*3/uL (ref 0.0–0.7)
Eosinophils Relative: 4 % (ref 0.0–5.0)
HCT: 39.6 % (ref 36.0–46.0)
Hemoglobin: 13.3 g/dL (ref 12.0–15.0)
Lymphocytes Relative: 27.1 % (ref 12.0–46.0)
Lymphs Abs: 1.9 10*3/uL (ref 0.7–4.0)
MCHC: 33.7 g/dL (ref 30.0–36.0)
MCV: 99.4 fl (ref 78.0–100.0)
Monocytes Absolute: 0.6 10*3/uL (ref 0.1–1.0)
Monocytes Relative: 8.4 % (ref 3.0–12.0)
Neutro Abs: 4.1 10*3/uL (ref 1.4–7.7)
Neutrophils Relative %: 59.3 % (ref 43.0–77.0)
Platelets: 333 10*3/uL (ref 150.0–400.0)
RBC: 3.98 Mil/uL (ref 3.87–5.11)
RDW: 13.7 % (ref 11.5–15.5)
WBC: 6.9 10*3/uL (ref 4.0–10.5)

## 2019-12-16 MED ORDER — GLYCOPYRROLATE 2 MG PO TABS: 2.0000 mg | ORAL_TABLET | Freq: Every morning | ORAL | 6 refills | Status: AC

## 2019-12-16 NOTE — Patient Instructions (Addendum)
If you are age 84 or older, your body mass index should be between 23-30. Your Body mass index is 23.17 kg/m. If this is out of the aforementioned range listed, please consider follow up with your Primary Care Provider.  If you are age 16 or younger, your body mass index should be between 19-25. Your Body mass index is 23.17 kg/m. If this is out of the aformentioned range listed, please consider follow up with your Primary Care Provider.   Your provider has requested that you go to the basement level for lab work before leaving today. Press "B" on the elevator. The lab is located at the first door on the left as you exit the elevator.  You have been scheduled for a CT scan of the abdomen and pelvis at [facility], 1st floor Radiology. You are scheduled on 12/27/19  at 8:30 am. You should arrive 15 minutes prior to your appointment time for registration.  Please pick up 2 bottles of contrast from [facility] at least 3 days prior to your scan. The solution may taste better if refrigerated, but do NOT add ice or any other liquid to this solution. Shake well before drinking.   Please follow the written instructions below on the day of your exam:   1) Do not eat anything after 4:30 am (4 hours prior to your test)   2) Drink 1 bottle of contrast @ 6:30 am (2 hours prior to your exam)  Remember to shake well before drinking and do NOT pour over ice.     Drink 1 bottle of contrast @ 7:30 am (1 hour prior to your exam)   You may take any medications as prescribed with a small amount of water, if necessary. If you take any of the following medications: METFORMIN, GLUCOPHAGE, GLUCOVANCE, AVANDAMET, RIOMET, FORTAMET, [place] MET, JANUMET, GLUMETZA or METAGLIP, you MAY be asked to HOLD this medication 48 hours AFTER the exam.   The purpose of you drinking the oral contrast is to aid in the visualization of your intestinal tract. The contrast solution may cause some diarrhea. Depending on your  individual set of symptoms, you may also receive an intravenous injection of x-ray contrast/dye. Plan on being at [facility] for 45 minutes or longer, depending on the type of exam you are having performed.   If you have any questions regarding your exam or if you need to reschedule, you may call [facility] at [phone] opt 3, then opt 2 between the hours of 8:00 am and 5:00 pm, Monday-Friday.   START Glycopyrrolate 2 mg 1 tablet every morning.  Continue using your Lomotil  Follow up pending the results of your CT.

## 2019-12-16 NOTE — Progress Notes (Addendum)
Subjective:    Patient ID: Shelia Black, female    DOB: 04-14-1921, 86 y.o.   MRN: [id number]  HPI Shelia Black "Carla Drape" is a pleasant 86 year old white female, established with Dr. Fuller Plan and seen in the office once previously in April 2019 by myself.  She has a history of diverticulitis and was actually treated for diverticulitis at the time of that visit.  She had undergone prior colonoscopies while living in [place]. Also with history of interstitial lung disease, GERD, prior history of ischemic colitis, adult onset diabetes mellitus, chronic kidney disease. She and her daughter come in today with concerns about recent episodes of diarrhea with fecal incontinence.  Patient has had at least 4 of these episodes over the past 5 to 6 weeks.  They have, on very suddenly with urgency for bowel movement and then incontinence of diarrhea.  Once an episode starts she will have several episodes of diarrhea.  She has no complaints of abdominal pain or cramping no nausea or vomiting, no fever no melena or hematochezia.  In between these episodes she has returned to having normal bowel movements. She had an episode on October 28 which her daughter described as "terrible" with incontinence of liquid stool all over the carpet etc. With most recent episode did have low grade fever , and vomited  Twice over course of a couple days They had been in to see primary care, had labs done and stool studies.  Initially in early October she had an elevated WBC and was given a course of antibiotics.  Her daughter says these episodes started before that.  She had previously been on Metformin which has been on hold over the past month or so. No known infectious exposures, no other new medications or changes in medications or diet.  Over the past 1 week she has not had any recurrence and bowel movements have been normal.  There have been one occasion over the past month or so with a small amount of bright red blood noticed with  wiping but that had been during an episode of diarrhea. Patient's daughter is concerned about underlying cancer etc.  Patient has had lower abdominal pain intermittently and has had pain with episodes of diverticulitis but is not complaining of pain currently.  Review of Systems Pertinent positive and negative review of systems were noted in the above HPI section.  All other review of systems was otherwise negative.  Outpatient Encounter Medications as of 12/16/2019  Medication Sig  . acetaminophen (TYLENOL) 500 MG tablet Take 500-1,000 mg by mouth every 6 (six) hours as needed for moderate pain or fever.   Marland Kitchen albuterol (PROVENTIL HFA;VENTOLIN HFA) 108 (90 Base) MCG/ACT inhaler Inhale 1 puff into the lungs every 6 (six) hours as needed for wheezing or shortness of breath.  Marland Kitchen ammonium lactate (LAC-HYDRIN) 12 % lotion Apply 1 application topically daily as needed for dry skin.   . Ascorbic Acid (VITAMIN C) 1000 MG tablet Take 1,000 mg by mouth daily.  . chlorpheniramine-HYDROcodone (TUSSIONEX) 10-8 MG/5ML SUER Take 5 mLs by mouth every 12 (twelve) hours as needed for cough.   . Cholecalciferol (VITAMIN D-3) 5000 units TABS Take 1 tablet by mouth daily.  . diphenoxylate-atropine (LOMOTIL) 2.5-0.025 MG tablet Take 2 tablets by mouth 4 (four) times daily as needed for diarrhea or loose stools. diarrhea  . levothyroxine (SYNTHROID) 75 MCG tablet Take 75 mcg by mouth daily before breakfast.  . omeprazole (PRILOSEC) 40 MG capsule Take 1 capsule (40 mg total) by  mouth daily.  . Zinc 50 MG CAPS Take by mouth daily.  Marland Kitchen glycopyrrolate (ROBINUL) 2 MG tablet Take 1 tablet (2 mg total) by mouth in the morning.  . metFORMIN (GLUCOPHAGE) 500 MG tablet Take 1 tablet by mouth daily. (Patient not taking: Reported on 12/16/2019)  . [DISCONTINUED] Calcium Carbonate (CALCIUM 600 PO) Take 1 tablet by mouth 2 (two) times daily. (Patient not taking: Reported on 12/16/2019)  . [DISCONTINUED] Polyethyl Glycol-Propyl Glycol  (SYSTANE) 0.4-0.3 % SOLN Apply 1 drop to eye daily as needed (dry eyes). (Patient not taking: Reported on 12/16/2019)  . [DISCONTINUED] Probiotic Product (ALIGN PO) Take 1 tablet by mouth daily. (Patient not taking: Reported on 12/16/2019)   No facility-administered encounter medications on file as of 12/16/2019.   Allergies  Allergen Reactions  . Oxycodone-Acetaminophen Nausea Only  . Penicillins Hives and Rash    Has patient had a PCN reaction causing immediate rash, facial/tongue/throat swelling, SOB or lightheadedness with hypotension: no Has patient had a PCN reaction causing severe rash involving mucus membranes or skin necrosis: unknown Has patient had a PCN reaction that required hospitalization : unknown Has patient had a PCN reaction occurring within the last 10 years: no If all of the above answers are "NO", then may proceed with Cephalosporin use.   . Codeine     Nausea and vomiting  . Percocet [Oxycodone-Acetaminophen] Nausea And Vomiting  . Tramadol Nausea And Vomiting   Patient Active Problem List   Diagnosis Date Noted  . Atypical chest pain 01/14/2019  . Hypoxia 10/05/2018  . Back pain, lumbosacral 10/05/2018  . Back pain 10/05/2018  . Ischemic colitis ([place]) 03/14/2018  . Colitis 03/13/2018  . Colitis with rectal bleeding 03/12/2018  . Sepsis ([place]) 05/23/2016  . Diverticulitis 05/23/2016  . GERD (gastroesophageal reflux disease) 05/23/2016  . Hypothyroidism 05/23/2016  . Depression 05/23/2016  . ILD (interstitial lung disease) ([place])   . Pulmonary fibrosis ([place])   . Acute UTI   . UTI (lower urinary tract infection)   . Hyperglycemia 02/20/2015  . Diabetes mellitus type 2 in obese ([place]) 02/20/2015  . CKD (chronic kidney disease) stage 3, GFR 30-59 ml/min ([facility]) 02/20/2015  . UTI (urinary tract infection) 04/29/2014  . Diverticulitis of colon with bleeding 04/29/2014  . Acute respiratory failure with hypoxia ([place]) 04/29/2014  . Acute renal failure ([place])  04/29/2014  . Hyperkalemia 04/29/2014  . Nausea and vomiting 04/29/2014   Social History   Socioeconomic History  . Marital status: Widowed    Spouse name: Not on file  . Number of children: 2  . Years of education: Not on file  . Highest education level: Not on file  Occupational History  . Occupation: retired  Tobacco Use  . Smoking status: Never Smoker  . Smokeless tobacco: Never Used  Substance and Sexual Activity  . Alcohol use: No  . Drug use: No  . Sexual activity: Not Currently    Birth control/protection: None  Other Topics Concern  . Not on file  Social History Narrative  . Not on file   Social Determinants of Health   Financial Resource Strain:   . Difficulty of Paying Living Expenses: Not on file  Food Insecurity:   . Worried About Charity fundraiser in the Last Year: Not on file  . Ran Out of Food in the Last Year: Not on file  Transportation Needs:   . Lack of Transportation (Medical): Not on file  . Lack of Transportation (Non-Medical): Not on file  Physical Activity:   . Days of Exercise per Week: Not on file  . Minutes of Exercise per Session: Not on file  Stress:   . Feeling of Stress : Not on file  Social Connections:   . Frequency of Communication with Friends and Family: Not on file  . Frequency of Social Gatherings with Friends and Family: Not on file  . Attends Religious Services: Not on file  . Active Member of Clubs or Organizations: Not on file  . Attends Archivist Meetings: Not on file  . Marital Status: Not on file  Intimate Partner Violence:   . Fear of Current or Ex-Partner: Not on file  . Emotionally Abused: Not on file  . Physically Abused: Not on file  . Sexually Abused: Not on file    Ms. Nop's family history includes Diabetes in her mother and sister; Hypertension in her father; Kidney cancer in her mother; Skin cancer in her father; Supraventricular tachycardia in her father.      Objective:      Vitals:   12/16/19 0905  BP: 104/60  Pulse: (!) 54  SpO2: 94%    Physical Exam Well-developed well-nourished elderly white female in a wheelchair, accompanied by her daughter in no acute distress.  Patient very hard of hearing and hearing aid not functioning, history given by patient's daughter height, Weight, 135 BMI 23.1  HEENT; nontraumatic normocephalic, EOMI, PE RR LA, sclera anicteric. Oropharynx; not done Neck; supple, no JVD Cardiovascular; regular rate and rhythm with S1-S2, no murmur rub or gallop Pulmonary; Clear bilaterally Abdomen; soft, nontender, nondistended, no palpable mass or hepatosplenomegaly, bowel sounds are active Rectal; not done today Skin; benign exam, no jaundice rash or appreciable lesions Extremities; no clubbing cyanosis or edema skin warm and dry Neuro/Psych; alert and oriented x4, grossly nonfocal mood and affect appropriate, hard of hearing       Assessment & Plan:   #53 86 year old white female with 2-3 episodes of recent episodic urgent diarrhea with incontinence.  Patient had several bowel movements each day during each of these episodes and has returned to normal bowel movements in between. No further symptoms over the past 1 week.  No nausea vomiting fever chills or abdominal pain Daughter had related that with the last episode a week of go she did have some low-grade fever and nausea and vomiting x1.  Etiology of symptoms is not clear, recent labs and stool studies by PCP unremarkable and symptoms have currently resolved. Rule out viral gastroenteritis, rule out IBS, rule out low-grade obstructive symptoms, rule out intra-abdominal inflammatory process or neoplasm  #2 prior history of diverticulitis 3.  GERD 4.  Interstitial lung disease 5.  Prior history of ischemic colitis 6.  Chronic kidney disease 7.  Adult onset diabetes mellitus  Plan; CBC and be met today Schedule for CT of the abdomen pelvis with contrast. Start trial of  glycopyrrolate 2 mg p.o. every morning. Patient has Lomotil at home if needed for as needed use. Further recommendations pending results of CT.  Cindra Austad S Davit Vassar PA-C 12/16/2019   Cc: [name], PA-C

## 2019-12-16 NOTE — Progress Notes (Signed)
Reviewed and agree with management plan.  Tay Whitwell T. Clayburn Weekly, MD FACG [facility]  

## 2019-12-27 ENCOUNTER — Ambulatory Visit (HOSPITAL_COMMUNITY): Payer: Medicare Other

## 2019-12-29 ENCOUNTER — Other Ambulatory Visit: Payer: Self-pay

## 2019-12-29 ENCOUNTER — Ambulatory Visit (HOSPITAL_COMMUNITY)
Admission: RE | Admit: 2019-12-29 | Discharge: 2019-12-29 | Disposition: A | Discharge status: Home / Self Care | Payer: Medicare Other | Source: Ambulatory Visit | Attending: Physician Assistant | Admitting: Physician Assistant

## 2019-12-29 ENCOUNTER — Encounter (HOSPITAL_COMMUNITY): Payer: Self-pay

## 2019-12-29 DIAGNOSIS — R1030 Lower abdominal pain, unspecified: Secondary | ICD-10-CM | POA: Diagnosis present

## 2019-12-29 DIAGNOSIS — R197 Diarrhea, unspecified: Secondary | ICD-10-CM | POA: Diagnosis present

## 2019-12-29 MED ORDER — IOHEXOL 300 MG/ML  SOLN
100.0000 mL | Freq: Once | INTRAMUSCULAR | Status: AC | PRN
  Administered 2019-12-29: 75 mL via INTRAVENOUS

## 2020-01-03 ENCOUNTER — Telehealth: Payer: Self-pay

## 2020-01-03 NOTE — Telephone Encounter (Signed)
-----   Message from Sammuel Cooper, PA-C sent at 12/29/2019  3:13 PM EST ----- Please call pt's daughter who is her caregiver and let them l know the CT scan does not show any sign of cancer, no gallstones, no inflammation of intestine , uterus unremarkable - she does have a distended bladder, and may not be emptying her bladder . I do not know if they have a urologist . If her bladder is staying distended this could make her feel uncomfortable - if they would like Urology referral please proceed with that - thanks

## 2020-01-03 NOTE — Telephone Encounter (Signed)
The daughter is aware of the results. She said the patient thought she had a UTI a few days ago. They used the home AZO urine test to screen. She was negative. The symptoms have stopped. She is urinating without issues. She will discuss this option and let us know if she does want the referral.

## 2020-02-28 ENCOUNTER — Other Ambulatory Visit: Payer: Self-pay | Admitting: Family Medicine

## 2020-02-28 ENCOUNTER — Emergency Department (HOSPITAL_COMMUNITY): Payer: Medicare Other

## 2020-02-28 ENCOUNTER — Encounter (HOSPITAL_COMMUNITY): Payer: Self-pay

## 2020-02-28 ENCOUNTER — Inpatient Hospital Stay (HOSPITAL_COMMUNITY)
Admission: EM | Admit: 2020-02-28 | Discharge: 2020-03-10 | DRG: 871 | Disposition: A | Discharge status: Skilled Nursing Facility | Payer: Medicare Other | Attending: Internal Medicine | Admitting: Internal Medicine

## 2020-02-28 ENCOUNTER — Other Ambulatory Visit: Payer: Self-pay

## 2020-02-28 DIAGNOSIS — Z880 Allergy status to penicillin: Secondary | ICD-10-CM

## 2020-02-28 DIAGNOSIS — K5732 Diverticulitis of large intestine without perforation or abscess without bleeding: Secondary | ICD-10-CM | POA: Diagnosis present

## 2020-02-28 DIAGNOSIS — Z8051 Family history of malignant neoplasm of kidney: Secondary | ICD-10-CM

## 2020-02-28 DIAGNOSIS — J84112 Idiopathic pulmonary fibrosis: Secondary | ICD-10-CM | POA: Diagnosis present

## 2020-02-28 DIAGNOSIS — R7981 Abnormal blood-gas level: Secondary | ICD-10-CM

## 2020-02-28 DIAGNOSIS — M1990 Unspecified osteoarthritis, unspecified site: Secondary | ICD-10-CM | POA: Diagnosis present

## 2020-02-28 DIAGNOSIS — Z9981 Dependence on supplemental oxygen: Secondary | ICD-10-CM

## 2020-02-28 DIAGNOSIS — E876 Hypokalemia: Secondary | ICD-10-CM | POA: Diagnosis not present

## 2020-02-28 DIAGNOSIS — Z79890 Hormone replacement therapy (postmenopausal): Secondary | ICD-10-CM

## 2020-02-28 DIAGNOSIS — K5780 Diverticulitis of intestine, part unspecified, with perforation and abscess without bleeding: Secondary | ICD-10-CM

## 2020-02-28 DIAGNOSIS — Z8249 Family history of ischemic heart disease and other diseases of the circulatory system: Secondary | ICD-10-CM

## 2020-02-28 DIAGNOSIS — Z885 Allergy status to narcotic agent status: Secondary | ICD-10-CM

## 2020-02-28 DIAGNOSIS — E1122 Type 2 diabetes mellitus with diabetic chronic kidney disease: Secondary | ICD-10-CM | POA: Diagnosis present

## 2020-02-28 DIAGNOSIS — J849 Interstitial pulmonary disease, unspecified: Secondary | ICD-10-CM | POA: Diagnosis present

## 2020-02-28 DIAGNOSIS — M9901 Segmental and somatic dysfunction of cervical region: Secondary | ICD-10-CM

## 2020-02-28 DIAGNOSIS — K5792 Diverticulitis of intestine, part unspecified, without perforation or abscess without bleeding: Secondary | ICD-10-CM

## 2020-02-28 DIAGNOSIS — I129 Hypertensive chronic kidney disease with stage 1 through stage 4 chronic kidney disease, or unspecified chronic kidney disease: Secondary | ICD-10-CM | POA: Diagnosis present

## 2020-02-28 DIAGNOSIS — J449 Chronic obstructive pulmonary disease, unspecified: Secondary | ICD-10-CM | POA: Diagnosis present

## 2020-02-28 DIAGNOSIS — J189 Pneumonia, unspecified organism: Secondary | ICD-10-CM

## 2020-02-28 DIAGNOSIS — Z7984 Long term (current) use of oral hypoglycemic drugs: Secondary | ICD-10-CM

## 2020-02-28 DIAGNOSIS — H9190 Unspecified hearing loss, unspecified ear: Secondary | ICD-10-CM | POA: Diagnosis present

## 2020-02-28 DIAGNOSIS — N179 Acute kidney failure, unspecified: Secondary | ICD-10-CM | POA: Diagnosis not present

## 2020-02-28 DIAGNOSIS — E039 Hypothyroidism, unspecified: Secondary | ICD-10-CM | POA: Diagnosis present

## 2020-02-28 DIAGNOSIS — W19XXXA Unspecified fall, initial encounter: Secondary | ICD-10-CM

## 2020-02-28 DIAGNOSIS — Z7952 Long term (current) use of systemic steroids: Secondary | ICD-10-CM

## 2020-02-28 DIAGNOSIS — K219 Gastro-esophageal reflux disease without esophagitis: Secondary | ICD-10-CM | POA: Diagnosis present

## 2020-02-28 DIAGNOSIS — Z66 Do not resuscitate: Secondary | ICD-10-CM | POA: Diagnosis not present

## 2020-02-28 DIAGNOSIS — A419 Sepsis, unspecified organism: Secondary | ICD-10-CM | POA: Diagnosis not present

## 2020-02-28 DIAGNOSIS — Z20822 Contact with and (suspected) exposure to covid-19: Secondary | ICD-10-CM | POA: Diagnosis present

## 2020-02-28 DIAGNOSIS — R609 Edema, unspecified: Secondary | ICD-10-CM

## 2020-02-28 DIAGNOSIS — M5030 Other cervical disc degeneration, unspecified cervical region: Secondary | ICD-10-CM | POA: Diagnosis present

## 2020-02-28 DIAGNOSIS — E1169 Type 2 diabetes mellitus with other specified complication: Secondary | ICD-10-CM | POA: Diagnosis present

## 2020-02-28 DIAGNOSIS — Z79899 Other long term (current) drug therapy: Secondary | ICD-10-CM

## 2020-02-28 DIAGNOSIS — K760 Fatty (change of) liver, not elsewhere classified: Secondary | ICD-10-CM | POA: Diagnosis present

## 2020-02-28 DIAGNOSIS — J8410 Pulmonary fibrosis, unspecified: Secondary | ICD-10-CM | POA: Diagnosis present

## 2020-02-28 DIAGNOSIS — L0291 Cutaneous abscess, unspecified: Secondary | ICD-10-CM | POA: Diagnosis present

## 2020-02-28 DIAGNOSIS — J690 Pneumonitis due to inhalation of food and vomit: Secondary | ICD-10-CM | POA: Diagnosis not present

## 2020-02-28 DIAGNOSIS — Z833 Family history of diabetes mellitus: Secondary | ICD-10-CM

## 2020-02-28 DIAGNOSIS — E669 Obesity, unspecified: Secondary | ICD-10-CM | POA: Diagnosis present

## 2020-02-28 DIAGNOSIS — R0902 Hypoxemia: Secondary | ICD-10-CM | POA: Diagnosis not present

## 2020-02-28 DIAGNOSIS — Z96653 Presence of artificial knee joint, bilateral: Secondary | ICD-10-CM | POA: Diagnosis present

## 2020-02-28 DIAGNOSIS — R222 Localized swelling, mass and lump, trunk: Secondary | ICD-10-CM

## 2020-02-28 DIAGNOSIS — K5721 Diverticulitis of large intestine with perforation and abscess with bleeding: Secondary | ICD-10-CM | POA: Diagnosis present

## 2020-02-28 DIAGNOSIS — M109 Gout, unspecified: Secondary | ICD-10-CM

## 2020-02-28 DIAGNOSIS — K5733 Diverticulitis of large intestine without perforation or abscess with bleeding: Secondary | ICD-10-CM | POA: Diagnosis present

## 2020-02-28 DIAGNOSIS — R339 Retention of urine, unspecified: Secondary | ICD-10-CM | POA: Diagnosis present

## 2020-02-28 DIAGNOSIS — N183 Chronic kidney disease, stage 3 unspecified: Secondary | ICD-10-CM | POA: Diagnosis present

## 2020-02-28 DIAGNOSIS — Z808 Family history of malignant neoplasm of other organs or systems: Secondary | ICD-10-CM

## 2020-02-28 DIAGNOSIS — K449 Diaphragmatic hernia without obstruction or gangrene: Secondary | ICD-10-CM | POA: Diagnosis present

## 2020-02-28 LAB — CBC
HCT: 34.3 % — ABNORMAL LOW (ref 36.0–46.0)
Hemoglobin: 11 g/dL — ABNORMAL LOW (ref 12.0–15.0)
MCH: 32.8 pg (ref 26.0–34.0)
MCHC: 32.1 g/dL (ref 30.0–36.0)
MCV: 102.4 fL — ABNORMAL HIGH (ref 80.0–100.0)
Platelets: 331 10*3/uL (ref 150–400)
RBC: 3.35 MIL/uL — ABNORMAL LOW (ref 3.87–5.11)
RDW: 14.5 % (ref 11.5–15.5)
WBC: 11.2 10*3/uL — ABNORMAL HIGH (ref 4.0–10.5)
nRBC: 0 % (ref 0.0–0.2)

## 2020-02-28 LAB — COMPREHENSIVE METABOLIC PANEL
ALT: 26 U/L (ref 0–44)
AST: 28 U/L (ref 15–41)
Albumin: 3.2 g/dL — ABNORMAL LOW (ref 3.5–5.0)
Alkaline Phosphatase: 80 U/L (ref 38–126)
Anion gap: 10 (ref 5–15)
BUN: 21 mg/dL (ref 8–23)
CO2: 26 mmol/L (ref 22–32)
Calcium: 8.9 mg/dL (ref 8.9–10.3)
Chloride: 97 mmol/L — ABNORMAL LOW (ref 98–111)
Creatinine, Ser: 1.03 mg/dL — ABNORMAL HIGH (ref 0.44–1.00)
GFR, Estimated: 50 mL/min — ABNORMAL LOW (ref >60)
Glucose, Bld: 181 mg/dL — ABNORMAL HIGH (ref 70–99)
Potassium: 5.1 mmol/L (ref 3.5–5.1)
Sodium: 133 mmol/L — ABNORMAL LOW (ref 135–145)
Total Bilirubin: 0.5 mg/dL (ref 0.3–1.2)
Total Protein: 7.3 g/dL (ref 6.5–8.1)

## 2020-02-28 LAB — LIPASE, BLOOD: Lipase: 17 U/L (ref 11–51)

## 2020-02-28 LAB — URINALYSIS, ROUTINE W REFLEX MICROSCOPIC
Bilirubin Urine: NEGATIVE
Glucose, UA: NEGATIVE mg/dL
Hgb urine dipstick: NEGATIVE
Ketones, ur: NEGATIVE mg/dL
Leukocytes,Ua: NEGATIVE
Nitrite: NEGATIVE
Protein, ur: NEGATIVE mg/dL
Specific Gravity, Urine: 1.013 (ref 1.005–1.030)
pH: 5 (ref 5.0–8.0)

## 2020-02-28 LAB — LACTIC ACID, PLASMA: Lactic Acid, Venous: 1.5 mmol/L (ref 0.5–1.9)

## 2020-02-28 LAB — POC OCCULT BLOOD, ED: Fecal Occult Bld: NEGATIVE

## 2020-02-28 LAB — SARS CORONAVIRUS 2 BY RT PCR (HOSPITAL ORDER, PERFORMED IN ~~LOC~~ HOSPITAL LAB): SARS Coronavirus 2: NEGATIVE

## 2020-02-28 MED ORDER — CIPROFLOXACIN IN D5W 400 MG/200ML IV SOLN
400.0000 mg | Freq: Once | INTRAVENOUS | Status: AC
  Administered 2020-02-28: 400 mg via INTRAVENOUS
  Filled 2020-02-28: qty 200

## 2020-02-28 MED ORDER — SODIUM CHLORIDE 0.9 % IV BOLUS
500.0000 mL | Freq: Once | INTRAVENOUS | Status: AC
  Administered 2020-02-28: 500 mL via INTRAVENOUS

## 2020-02-28 MED ORDER — IOHEXOL 300 MG/ML  SOLN
100.0000 mL | Freq: Once | INTRAMUSCULAR | Status: AC | PRN
  Administered 2020-02-28: 100 mL via INTRAVENOUS

## 2020-02-28 MED ORDER — ACETAMINOPHEN 325 MG PO TABS
650.0000 mg | ORAL_TABLET | Freq: Once | ORAL | Status: AC
  Administered 2020-02-28: 650 mg via ORAL
  Filled 2020-02-28: qty 2

## 2020-02-28 MED ORDER — METRONIDAZOLE IN NACL 5-0.79 MG/ML-% IV SOLN
500.0000 mg | Freq: Once | INTRAVENOUS | Status: AC
  Administered 2020-02-28: 500 mg via INTRAVENOUS
  Filled 2020-02-28: qty 100

## 2020-02-28 NOTE — ED Triage Notes (Signed)
Patient is HOH. patient's daughter reports that the patient has not urinated since 0600 today. Also reported that the patient has had small amount of blood from the rectum, only noted when she cleaned the patient. Patient also c/o lower abdominal pain since this AM

## 2020-02-28 NOTE — ED Provider Notes (Signed)
[facility] DEPT Provider Note   CSN: [id number] Arrival date & time: 02/28/20  1525     History Chief Complaint  Patient presents with  . Urinary Retention  . Rectal Bleeding  . Abdominal Pain    Shelia Black is a 87 y.o. female.  Patient presents with abdominal pain, difficulty urinating and noticing some blood in her stool earlier today. Denies any headache or chest pain. No reported fevers or cough. No vomiting or diarrhea noted. Family states she has a history of diverticulosis in the past and is concerned this may be recurrent.        Past Medical History:  Diagnosis Date  . Arthritis   . Cataract   . Chronic cough   . COPD (chronic obstructive pulmonary disease) ([place])   . Diabetes mellitus without complication ([place])   . Diverticulitis   . Diverticulosis   . GERD (gastroesophageal reflux disease)   . Hepatic steatosis   . Hiatal hernia   . Hypothyroidism   . ILD (interstitial lung disease) ([place])   . Pulmonary fibrosis ([place])   . Thyroid disease   . Vertigo     Patient Active Problem List   Diagnosis Date Noted  . Atypical chest pain 01/14/2019  . Hypoxia 10/05/2018  . Back pain, lumbosacral 10/05/2018  . Back pain 10/05/2018  . Ischemic colitis ([place]) 03/14/2018  . Colitis 03/13/2018  . Colitis with rectal bleeding 03/12/2018  . Sepsis ([place]) 05/23/2016  . Diverticulitis 05/23/2016  . GERD (gastroesophageal reflux disease) 05/23/2016  . Hypothyroidism 05/23/2016  . Depression 05/23/2016  . ILD (interstitial lung disease) ([place])   . Pulmonary fibrosis ([place])   . Acute UTI   . UTI (lower urinary tract infection)   . Hyperglycemia 02/20/2015  . Diabetes mellitus type 2 in obese ([place]) 02/20/2015  . CKD (chronic kidney disease) stage 3, GFR 30-59 ml/min (HCC) 02/20/2015  . UTI (urinary tract infection) 04/29/2014  . Diverticulitis of colon with bleeding 04/29/2014  . Acute respiratory failure with hypoxia ([place])  04/29/2014  . Acute renal failure ([place]) 04/29/2014  . Hyperkalemia 04/29/2014  . Nausea and vomiting 04/29/2014    Past Surgical History:  Procedure Laterality Date  . APPENDECTOMY    . INTRAOCULAR LENS INSERTION  1997  . REPLACEMENT TOTAL KNEE BILATERAL    . SHOULDER SURGERY Right    tendonitis     OB History   No obstetric history on file.     Family History  Problem Relation Age of Onset  . Diabetes Mother   . Kidney cancer Mother   . Hypertension Father   . Supraventricular tachycardia Father   . Skin cancer Father   . Diabetes Sister     Social History   Tobacco Use  . Smoking status: Never Smoker  . Smokeless tobacco: Never Used  Vaping Use  . Vaping Use: Never used  Substance Use Topics  . Alcohol use: No  . Drug use: No    Home Medications Prior to Admission medications   Medication Sig Start Date End Date Taking? Authorizing Provider  acetaminophen (TYLENOL) 500 MG tablet Take 500-1,000 mg by mouth every 6 (six) hours as needed for moderate pain or fever.     [provider]  albuterol (PROVENTIL HFA;VENTOLIN HFA) 108 (90 Base) MCG/ACT inhaler Inhale 1 puff into the lungs every 6 (six) hours as needed for wheezing or shortness of breath. 05/27/16   Doreatha Lew, MD  ammonium lactate (LAC-HYDRIN) 12 % lotion Apply  1 application topically daily as needed for dry skin.     [provider]  Ascorbic Acid (VITAMIN C) 1000 MG tablet Take 1,000 mg by mouth daily.    [provider]  chlorpheniramine-HYDROcodone (TUSSIONEX) 10-8 MG/5ML SUER Take 5 mLs by mouth every 12 (twelve) hours as needed for cough.  01/14/18   [provider]  Cholecalciferol (VITAMIN D-3) 5000 units TABS Take 1 tablet by mouth daily.    [provider]  diphenoxylate-atropine (LOMOTIL) 2.5-0.025 MG tablet Take 2 tablets by mouth 4 (four) times daily as needed for diarrhea or loose stools. diarrhea 11/11/12   [provider]   glycopyrrolate (ROBINUL) 2 MG tablet Take 1 tablet (2 mg total) by mouth in the morning. 12/16/19   Esterwood, Amy S, PA-C  levothyroxine (SYNTHROID) 75 MCG tablet Take 75 mcg by mouth daily before breakfast.    [provider]  metFORMIN (GLUCOPHAGE) 500 MG tablet Take 1 tablet by mouth daily. Patient not taking: Reported on 12/16/2019    [provider]  omeprazole (PRILOSEC) 40 MG capsule Take 1 capsule (40 mg total) by mouth daily. 03/15/18   Eugenie Filler, MD  Zinc 50 MG CAPS Take by mouth daily.    [provider]    Allergies    Oxycodone-acetaminophen, Penicillins, Codeine, Percocet [oxycodone-acetaminophen], and Tramadol  Review of Systems   Review of Systems  Constitutional: Negative for fever.  HENT: Negative for ear pain.   Eyes: Negative for pain.  Respiratory: Negative for cough.   Cardiovascular: Negative for chest pain.  Gastrointestinal: Positive for abdominal pain.  Genitourinary: Negative for flank pain.  Musculoskeletal: Negative for back pain.  Skin: Negative for rash.  Neurological: Negative for headaches.    Physical Exam Updated Vital Signs BP (!) 117/50   Pulse 87   Temp 99.5 F (37.5 C)   Resp (!) 26   Ht 5' 4"  (1.626 m)   Wt 61.2 kg   LMP  (LMP Unknown)   SpO2 90%   BMI 23.17 kg/m   Physical Exam Constitutional:      General: She is not in acute distress.    Appearance: Normal appearance.  HENT:     Head: Normocephalic.     Nose: Nose normal.  Eyes:     Extraocular Movements: Extraocular movements intact.  Cardiovascular:     Rate and Rhythm: Normal rate.  Pulmonary:     Effort: Pulmonary effort is normal.  Abdominal:     Tenderness: There is abdominal tenderness in the periumbilical area.  Musculoskeletal:        General: Normal range of motion.     Cervical back: Normal range of motion.  Neurological:     General: No focal deficit present.     Mental Status: She is alert. Mental status is at  baseline.     ED Results / Procedures / Treatments   Labs (all labs ordered are listed, but only abnormal results are displayed) Labs Reviewed  COMPREHENSIVE METABOLIC PANEL - Abnormal; Notable for the following components:      Result Value   Sodium 133 (*)    Chloride 97 (*)    Glucose, Bld 181 (*)    Creatinine, Ser 1.03 (*)    Albumin 3.2 (*)    GFR, Estimated 50 (*)    All other components within normal limits  CBC - Abnormal; Notable for the following components:   WBC 11.2 (*)    RBC 3.35 (*)  Hemoglobin 11.0 (*)    HCT 34.3 (*)    MCV 102.4 (*)    All other components within normal limits  SARS CORONAVIRUS 2 BY RT PCR (HOSPITAL ORDER, [facility])  CULTURE, BLOOD (ROUTINE X 2)  CULTURE, BLOOD (ROUTINE X 2)  LIPASE, BLOOD  URINALYSIS, ROUTINE W REFLEX MICROSCOPIC  LACTIC ACID, PLASMA  POC OCCULT BLOOD, ED    EKG None  Radiology CT Abdomen Pelvis W Contrast  Result Date: 02/28/2020 CLINICAL DATA:  Acute abdominal pain. Difficulty urinating. Small amount of blood in the rectum. EXAM: CT ABDOMEN AND PELVIS WITH CONTRAST TECHNIQUE: Multidetector CT imaging of the abdomen and pelvis was performed using the standard protocol following bolus administration of intravenous contrast. CONTRAST:  120m OMNIPAQUE IOHEXOL 300 MG/ML  SOLN COMPARISON:  12/29/2019 FINDINGS: Lower chest: Mild cardiac enlargement. Coronary artery calcifications. Fibrosis and honeycomb changes in the lung bases. Small esophageal hiatal hernia. Hepatobiliary: No focal liver abnormality is seen. No gallstones, gallbladder wall thickening, or biliary dilatation. Pancreas: Unremarkable. No pancreatic ductal dilatation or surrounding inflammatory changes. Spleen: Normal in size without focal abnormality. Adrenals/Urinary Tract: Adrenal glands are unremarkable. Kidneys are normal, without renal calculi, focal lesion, or hydronephrosis. Bladder is decompressed with a Foley catheter.  Stomach/Bowel: Diverticulum off of the cardiac region of the stomach. Stomach is decompressed without wall thickening. Small bowel are mostly decompressed with scattered fluid. No wall thickening. Scattered stool throughout the colon without colonic distention. Diverticulosis of the sigmoid colon. The rectosigmoid junction demonstrates wall thickening and edema with adjacent stranding. 3.1 cm diameter cystic collection suggested along the lateral wall of the rectosigmoid junction. The appearance suggests focal acute diverticulitis with an abscess. An underlying mass lesion could also have this appearance and should be excluded in follow-up. Vascular/Lymphatic: Aortic atherosclerosis. No enlarged abdominal or pelvic lymph nodes. Reproductive: Uterus and bilateral adnexa are unremarkable. Other: No abdominal wall hernia or abnormality. No abdominopelvic ascites. Musculoskeletal: Degenerative changes in the spine and hips. IMPRESSION: 1. Diverticulosis of the sigmoid colon with wall thickening, edema and adjacent stranding. 3.1 cm diameter cystic collection suggested along the lateral wall of the rectosigmoid junction. The appearance suggests focal acute diverticulitis with an abscess. An underlying mass lesion could also have this appearance and should be excluded in follow-up. 2. Small esophageal hiatal hernia. 3. Fibrosis and honeycomb changes in the lung bases. 4. Aortic atherosclerosis. 5. Foley catheter in the bladder. 6. Diverticulum off of the cardiac region of the stomach. 7. Degenerative changes in the spine and hips. Aortic Atherosclerosis (ICD10-I70.0). Electronically Signed   By: WLucienne CapersM.D.   On: 02/28/2020 21:50    Procedures Procedures   Medications Ordered in ED Medications  ciprofloxacin (CIPRO) IVPB 400 mg (400 mg Intravenous New Bag/Given 02/28/20 2305)  metroNIDAZOLE (FLAGYL) IVPB 500 mg (500 mg Intravenous New Bag/Given 02/28/20 2306)  acetaminophen (TYLENOL) tablet 650 mg (650  mg Oral Given 02/28/20 1909)  sodium chloride 0.9 % bolus 500 mL (0 mLs Intravenous Stopped 02/28/20 2240)  iohexol (OMNIPAQUE) 300 MG/ML solution 100 mL (100 mLs Intravenous Contrast Given 02/28/20 2122)    ED Course  I have reviewed the triage vital signs and the nursing notes.  Pertinent labs & imaging results that were available during my care of the patient were reviewed by me and considered in my medical decision making (see chart for details).    MDM Rules/Calculators/A&P  Labs are normal white count normal chemistry.  CT L and pelvis concerning for acute diverticulitis with 3 cm abscess.  Case discussed with surgery Dr.Gerkin, recommending IV antibiotics and admission.  Medical consultation for admission.   Final Clinical Impression(s) / ED Diagnoses Final diagnoses:  Acute diverticulitis  Hypoxemia    Rx / DC Orders ED Discharge Orders    None       Luna Fuse, MD 02/28/20 2306

## 2020-02-28 NOTE — ED Notes (Signed)
Pt began falling asleep and her oxygen level dropped to 88. Pt placed on 2L by nasal cannula and her oxygen went back to 97

## 2020-02-29 DIAGNOSIS — R339 Retention of urine, unspecified: Secondary | ICD-10-CM | POA: Diagnosis present

## 2020-02-29 DIAGNOSIS — A419 Sepsis, unspecified organism: Secondary | ICD-10-CM | POA: Diagnosis present

## 2020-02-29 DIAGNOSIS — Z7952 Long term (current) use of systemic steroids: Secondary | ICD-10-CM | POA: Diagnosis not present

## 2020-02-29 DIAGNOSIS — K449 Diaphragmatic hernia without obstruction or gangrene: Secondary | ICD-10-CM | POA: Diagnosis present

## 2020-02-29 DIAGNOSIS — N179 Acute kidney failure, unspecified: Secondary | ICD-10-CM | POA: Diagnosis not present

## 2020-02-29 DIAGNOSIS — E1122 Type 2 diabetes mellitus with diabetic chronic kidney disease: Secondary | ICD-10-CM | POA: Diagnosis present

## 2020-02-29 DIAGNOSIS — J449 Chronic obstructive pulmonary disease, unspecified: Secondary | ICD-10-CM | POA: Diagnosis present

## 2020-02-29 DIAGNOSIS — K5721 Diverticulitis of large intestine with perforation and abscess with bleeding: Secondary | ICD-10-CM | POA: Diagnosis present

## 2020-02-29 DIAGNOSIS — K760 Fatty (change of) liver, not elsewhere classified: Secondary | ICD-10-CM | POA: Diagnosis present

## 2020-02-29 DIAGNOSIS — J84112 Idiopathic pulmonary fibrosis: Secondary | ICD-10-CM | POA: Diagnosis present

## 2020-02-29 DIAGNOSIS — K5732 Diverticulitis of large intestine without perforation or abscess without bleeding: Secondary | ICD-10-CM | POA: Diagnosis present

## 2020-02-29 DIAGNOSIS — Z66 Do not resuscitate: Secondary | ICD-10-CM | POA: Diagnosis not present

## 2020-02-29 DIAGNOSIS — E876 Hypokalemia: Secondary | ICD-10-CM | POA: Diagnosis not present

## 2020-02-29 DIAGNOSIS — E039 Hypothyroidism, unspecified: Secondary | ICD-10-CM | POA: Diagnosis present

## 2020-02-29 DIAGNOSIS — K5720 Diverticulitis of large intestine with perforation and abscess without bleeding: Secondary | ICD-10-CM | POA: Diagnosis not present

## 2020-02-29 DIAGNOSIS — R531 Weakness: Secondary | ICD-10-CM | POA: Diagnosis not present

## 2020-02-29 DIAGNOSIS — K5733 Diverticulitis of large intestine without perforation or abscess with bleeding: Secondary | ICD-10-CM | POA: Diagnosis not present

## 2020-02-29 DIAGNOSIS — Z7189 Other specified counseling: Secondary | ICD-10-CM | POA: Diagnosis not present

## 2020-02-29 DIAGNOSIS — M1990 Unspecified osteoarthritis, unspecified site: Secondary | ICD-10-CM | POA: Diagnosis present

## 2020-02-29 DIAGNOSIS — M109 Gout, unspecified: Secondary | ICD-10-CM | POA: Diagnosis present

## 2020-02-29 DIAGNOSIS — Z9981 Dependence on supplemental oxygen: Secondary | ICD-10-CM | POA: Diagnosis not present

## 2020-02-29 DIAGNOSIS — E1169 Type 2 diabetes mellitus with other specified complication: Secondary | ICD-10-CM | POA: Diagnosis not present

## 2020-02-29 DIAGNOSIS — R0902 Hypoxemia: Secondary | ICD-10-CM | POA: Diagnosis present

## 2020-02-29 DIAGNOSIS — K5792 Diverticulitis of intestine, part unspecified, without perforation or abscess without bleeding: Secondary | ICD-10-CM | POA: Diagnosis not present

## 2020-02-29 DIAGNOSIS — Z515 Encounter for palliative care: Secondary | ICD-10-CM | POA: Diagnosis not present

## 2020-02-29 DIAGNOSIS — K5780 Diverticulitis of intestine, part unspecified, with perforation and abscess without bleeding: Secondary | ICD-10-CM | POA: Diagnosis not present

## 2020-02-29 DIAGNOSIS — H9190 Unspecified hearing loss, unspecified ear: Secondary | ICD-10-CM | POA: Diagnosis present

## 2020-02-29 DIAGNOSIS — N183 Chronic kidney disease, stage 3 unspecified: Secondary | ICD-10-CM | POA: Diagnosis present

## 2020-02-29 DIAGNOSIS — K219 Gastro-esophageal reflux disease without esophagitis: Secondary | ICD-10-CM | POA: Diagnosis present

## 2020-02-29 DIAGNOSIS — J690 Pneumonitis due to inhalation of food and vomit: Secondary | ICD-10-CM | POA: Diagnosis not present

## 2020-02-29 DIAGNOSIS — M5030 Other cervical disc degeneration, unspecified cervical region: Secondary | ICD-10-CM | POA: Diagnosis present

## 2020-02-29 DIAGNOSIS — I129 Hypertensive chronic kidney disease with stage 1 through stage 4 chronic kidney disease, or unspecified chronic kidney disease: Secondary | ICD-10-CM | POA: Diagnosis present

## 2020-02-29 DIAGNOSIS — Z20822 Contact with and (suspected) exposure to covid-19: Secondary | ICD-10-CM | POA: Diagnosis present

## 2020-02-29 LAB — LACTIC ACID, PLASMA: Lactic Acid, Venous: 0.7 mmol/L (ref 0.5–1.9)

## 2020-02-29 LAB — CBG MONITORING, ED
Glucose-Capillary: 85 mg/dL (ref 70–99)
Glucose-Capillary: 97 mg/dL (ref 70–99)

## 2020-02-29 MED ORDER — SODIUM CHLORIDE 0.9 % IV SOLN
2.0000 g | INTRAVENOUS | Status: DC
  Administered 2020-02-29 – 2020-03-06 (×7): 2 g via INTRAVENOUS
  Filled 2020-02-29: qty 2
  Filled 2020-02-29 (×3): qty 20
  Filled 2020-02-29 (×3): qty 2
  Filled 2020-02-29: qty 20

## 2020-02-29 MED ORDER — BISACODYL 10 MG RE SUPP: 10.0000 mg | Freq: Every day | RECTAL | Status: DC | PRN

## 2020-02-29 MED ORDER — ONDANSETRON HCL 4 MG/2ML IJ SOLN: 4.0000 mg | Freq: Four times a day (QID) | INTRAMUSCULAR | Status: DC | PRN

## 2020-02-29 MED ORDER — ACETAMINOPHEN 325 MG PO TABS
650.0000 mg | ORAL_TABLET | Freq: Four times a day (QID) | ORAL | Status: DC | PRN
  Administered 2020-03-01 – 2020-03-02 (×2): 650 mg via ORAL
  Filled 2020-02-29 (×3): qty 2

## 2020-02-29 MED ORDER — PANTOPRAZOLE SODIUM 40 MG PO TBEC
40.0000 mg | DELAYED_RELEASE_TABLET | Freq: Every day | ORAL | Status: DC
  Administered 2020-02-29 – 2020-03-10 (×11): 40 mg via ORAL
  Filled 2020-02-29 (×11): qty 1

## 2020-02-29 MED ORDER — INSULIN ASPART 100 UNIT/ML ~~LOC~~ SOLN
0.0000 [IU] | Freq: Three times a day (TID) | SUBCUTANEOUS | Status: DC
  Administered 2020-03-01 – 2020-03-04 (×3): 1 [IU] via SUBCUTANEOUS
  Administered 2020-03-04: 13:00:00 2 [IU] via SUBCUTANEOUS
  Administered 2020-03-05 (×2): 1 [IU] via SUBCUTANEOUS
  Administered 2020-03-06: 3 [IU] via SUBCUTANEOUS
  Administered 2020-03-07: 09:00:00 2 [IU] via SUBCUTANEOUS
  Administered 2020-03-07: 13:00:00 1 [IU] via SUBCUTANEOUS
  Administered 2020-03-07 – 2020-03-08 (×2): 5 [IU] via SUBCUTANEOUS
  Administered 2020-03-08: 2 [IU] via SUBCUTANEOUS
  Administered 2020-03-08: 3 [IU] via SUBCUTANEOUS
  Administered 2020-03-09: 1 [IU] via SUBCUTANEOUS
  Administered 2020-03-09: 17:00:00 5 [IU] via SUBCUTANEOUS
  Administered 2020-03-09: 1 [IU] via SUBCUTANEOUS
  Administered 2020-03-10: 2 [IU] via SUBCUTANEOUS
  Administered 2020-03-10: 18:00:00 5 [IU] via SUBCUTANEOUS
  Filled 2020-02-29: qty 0.09

## 2020-02-29 MED ORDER — LEVOTHYROXINE SODIUM 75 MCG PO TABS
75.0000 ug | ORAL_TABLET | Freq: Every day | ORAL | Status: DC
  Administered 2020-03-01 – 2020-03-10 (×10): 75 ug via ORAL
  Filled 2020-02-29 (×10): qty 1

## 2020-02-29 MED ORDER — SODIUM CHLORIDE 0.9 % IV BOLUS
1000.0000 mL | Freq: Once | INTRAVENOUS | Status: AC
  Administered 2020-02-29: 1000 mL via INTRAVENOUS

## 2020-02-29 MED ORDER — DEXTROSE-NACL 5-0.45 % IV SOLN: INTRAVENOUS | Status: AC

## 2020-02-29 MED ORDER — METRONIDAZOLE IN NACL 5-0.79 MG/ML-% IV SOLN: 500.0000 mg | Freq: Three times a day (TID) | INTRAVENOUS | Status: DC

## 2020-02-29 MED ORDER — ALBUTEROL SULFATE (2.5 MG/3ML) 0.083% IN NEBU: 2.5000 mg | INHALATION_SOLUTION | RESPIRATORY_TRACT | Status: DC | PRN

## 2020-02-29 MED ORDER — ALBUTEROL SULFATE HFA 108 (90 BASE) MCG/ACT IN AERS: 1.0000 | INHALATION_SPRAY | Freq: Four times a day (QID) | RESPIRATORY_TRACT | Status: DC | PRN

## 2020-02-29 MED ORDER — CIPROFLOXACIN IN D5W 400 MG/200ML IV SOLN: 400.0000 mg | INTRAVENOUS | Status: DC

## 2020-02-29 MED ORDER — ONDANSETRON HCL 4 MG PO TABS: 4.0000 mg | ORAL_TABLET | Freq: Four times a day (QID) | ORAL | Status: DC | PRN

## 2020-02-29 MED ORDER — METRONIDAZOLE IN NACL 5-0.79 MG/ML-% IV SOLN
500.0000 mg | Freq: Three times a day (TID) | INTRAVENOUS | Status: DC
  Administered 2020-02-29 – 2020-03-07 (×22): 500 mg via INTRAVENOUS
  Filled 2020-02-29 (×21): qty 100

## 2020-02-29 MED ORDER — HEPARIN SODIUM (PORCINE) 5000 UNIT/ML IJ SOLN
5000.0000 [IU] | Freq: Three times a day (TID) | INTRAMUSCULAR | Status: DC
  Administered 2020-02-29 – 2020-03-10 (×30): 5000 [IU] via SUBCUTANEOUS
  Filled 2020-02-29 (×30): qty 1

## 2020-02-29 MED ORDER — ACETAMINOPHEN 650 MG RE SUPP: 650.0000 mg | Freq: Four times a day (QID) | RECTAL | Status: DC | PRN

## 2020-02-29 MED ORDER — FENTANYL CITRATE (PF) 100 MCG/2ML IJ SOLN
12.5000 ug | INTRAMUSCULAR | Status: DC | PRN
Start: 2020-02-29 — End: 2020-03-04
  Administered 2020-02-29 – 2020-03-02 (×3): 12.5 ug via INTRAVENOUS
  Filled 2020-02-29 (×4): qty 2

## 2020-02-29 MED ORDER — IPRATROPIUM BROMIDE 0.02 % IN SOLN
0.5000 mg | Freq: Four times a day (QID) | RESPIRATORY_TRACT | Status: DC
  Administered 2020-02-29 – 2020-03-02 (×6): 0.5 mg via RESPIRATORY_TRACT
  Filled 2020-02-29 (×6): qty 2.5

## 2020-02-29 MED ORDER — LEVOTHYROXINE SODIUM 50 MCG PO TABS: 75.0000 ug | ORAL_TABLET | Freq: Every day | ORAL | Status: DC

## 2020-02-29 NOTE — ED Notes (Signed)
Pt's daughter took her hearing aids and clothes home

## 2020-02-29 NOTE — Consult Note (Addendum)
[facility] Surgery Consult Note  Shelia Black Mar 18, 1921  [id number].    Requesting MD: Thamas Jaegers, MD Chief Complaint/Reason for Consult: complicated diverticulitis   HPI:  [name] is a 87 y/o F with a PMH hypothyroidism, DM type II, GERD, COPD, arthritis, and diverticulosis who presented to [facility] 02/28/20 with a cc abdominal pain and urinary hesitancy. Reports cramping, non-radiating, bilateral lower abdominal pain that started 3-4 days ago. Associated with decreased appetite due to pain. Reports history of diverticulitis years ago - is unsure if it resolved with outpatient or inpatient treatment. Denies fever, chills, nausea, vomiting, SOB, hematochezia, or melena. States her last BM was before she came to the hospital and was "normal" and non-bloody. At baseline patient endorses constipation. She denies alcohol, tobacco, or other drug use. States she has been vaccinated against COVID 19. She is from [place] but lives in [place] with her daughter.  ED workup significant for WBC 11.2 and CT scan of the abdomen and pelvis showing sigmoid diverticulitis with 3 cm abscess.   ROS: Review of Systems  All other systems reviewed and are negative.  Family History  Problem Relation Age of Onset  . Diabetes Mother   . Kidney cancer Mother   . Hypertension Father   . Supraventricular tachycardia Father   . Skin cancer Father   . Diabetes Sister     Past Medical History:  Diagnosis Date  . Arthritis   . Cataract   . Chronic cough   . COPD (chronic obstructive pulmonary disease) ([place])   . Diabetes mellitus without complication ([place])   . Diverticulitis   . Diverticulosis   . GERD (gastroesophageal reflux disease)   . Hepatic steatosis   . Hiatal hernia   . Hypothyroidism   . ILD (interstitial lung disease) ([place])   . Pulmonary fibrosis ([place])   . Thyroid disease   . Vertigo     Past Surgical History:  Procedure Laterality Date  . APPENDECTOMY     . INTRAOCULAR LENS INSERTION  1997  . REPLACEMENT TOTAL KNEE BILATERAL    . SHOULDER SURGERY Right    tendonitis    Social History:  reports that she has never smoked. She has never used smokeless tobacco. She reports that she does not drink alcohol and does not use drugs.  Allergies:  Allergies  Allergen Reactions  . Oxycodone-Acetaminophen Nausea Only  . Penicillins Hives and Rash    Has patient had a PCN reaction causing immediate rash, facial/tongue/throat swelling, SOB or lightheadedness with hypotension: no Has patient had a PCN reaction causing severe rash involving mucus membranes or skin necrosis: unknown Has patient had a PCN reaction that required hospitalization : unknown Has patient had a PCN reaction occurring within the last 10 years: no If all of the above answers are "NO", then may proceed with Cephalosporin use.   . Codeine     Nausea and vomiting  . Percocet [Oxycodone-Acetaminophen] Nausea And Vomiting  . Tramadol Nausea And Vomiting    (Not in a hospital admission)   Blood pressure (!) 105/44, pulse 74, temperature 99.5 F (37.5 C), resp. rate (!) 32, height 5' 4"  (1.626 m), weight 61.2 kg, SpO2 98 %. Physical Exam: Constitutional: NAD; conversant; no deformities, hard of hearing  Eyes: Moist conjunctiva; no lid lag; anicteric; PERRL Neck: Trachea midline; no thyromegaly Lungs: Normal respiratory effort; no tactile fremitus CV: RRR; no palpable thrills; no pitting edema, pedal pulses 2+BL GI: Abd soft, mild distention, mild TTP lower abdomen without  guarding or peritonitis, no palpable hepatosplenomegaly MSK: symmetricalt; no clubbing/cyanosis Psychiatric: Appropriate affect; alert and oriented x3 Lymphatic: No palpable cervical or axillary lymphadenopathy  Results for orders placed or performed during the hospital encounter of 02/28/20 (from the past 48 hour(s))  Lipase, blood     Status: None   Collection Time: 02/28/20  3:43 PM  Result Value Ref  Range   Lipase 17 11 - 51 U/L    Comment: Performed at [facility], [place] [address] [place], [place] [address]  Comprehensive metabolic panel     Status: Abnormal   Collection Time: 02/28/20  3:43 PM  Result Value Ref Range   Sodium 133 (L) 135 - 145 mmol/L   Potassium 5.1 3.5 - 5.1 mmol/L   Chloride 97 (L) 98 - 111 mmol/L   CO2 26 22 - 32 mmol/L   Glucose, Bld 181 (H) 70 - 99 mg/dL    Comment: Glucose reference range applies only to samples taken after fasting for at least 8 hours.   BUN 21 8 - 23 mg/dL   Creatinine, Ser 1.03 (H) 0.44 - 1.00 mg/dL   Calcium 8.9 8.9 - 10.3 mg/dL   Total Protein 7.3 6.5 - 8.1 g/dL   Albumin 3.2 (L) 3.5 - 5.0 g/dL   AST 28 15 - 41 U/L   ALT 26 0 - 44 U/L   Alkaline Phosphatase 80 38 - 126 U/L   Total Bilirubin 0.5 0.3 - 1.2 mg/dL   GFR, Estimated 50 (L) >60 mL/min    Comment: (NOTE) Calculated using the CKD-EPI Creatinine Equation (2021)    Anion gap 10 5 - 15    Comment: Performed at [facility], [place] [address], [place], [place] [address]  CBC     Status: Abnormal   Collection Time: 02/28/20  3:43 PM  Result Value Ref Range   WBC 11.2 (H) 4.0 - 10.5 K/uL   RBC 3.35 (L) 3.87 - 5.11 MIL/uL   Hemoglobin 11.0 (L) 12.0 - 15.0 g/dL   HCT 34.3 (L) 36.0 - 46.0 %   MCV 102.4 (H) 80.0 - 100.0 fL   MCH 32.8 26.0 - 34.0 pg   MCHC 32.1 30.0 - 36.0 g/dL   RDW 14.5 11.5 - 15.5 %   Platelets 331 150 - 400 K/uL   nRBC 0.0 0.0 - 0.2 %    Comment: Performed at [facility], [place] [address] [place], [place] [address]  Urinalysis, Routine w reflex microscopic     Status: None   Collection Time: 02/28/20  6:50 PM  Result Value Ref Range   Color, Urine YELLOW YELLOW   APPearance CLEAR CLEAR   Specific Gravity, Urine 1.013 1.005 - 1.030   pH 5.0 5.0 - 8.0   Glucose, UA NEGATIVE NEGATIVE mg/dL   Hgb urine dipstick NEGATIVE NEGATIVE   Bilirubin Urine NEGATIVE NEGATIVE   Ketones, ur NEGATIVE NEGATIVE  mg/dL   Protein, ur NEGATIVE NEGATIVE mg/dL   Nitrite NEGATIVE NEGATIVE   Leukocytes,Ua NEGATIVE NEGATIVE    Comment: Performed at [facility] [address], [place], [place] [address]  Lactic acid, plasma     Status: None   Collection Time: 02/28/20  6:52 PM  Result Value Ref Range   Lactic Acid, Venous 1.5 0.5 - 1.9 mmol/L    Comment: Performed at [facility], [place] [address] [place],  [address]  SARS Coronavirus 2 by RT PCR (hospital order, performed in [facility]  hospital lab) Nasopharyngeal Nasopharyngeal Swab     Status: None   Collection Time: 02/28/20  7:16 PM   Specimen: Nasopharyngeal Swab  Result Value Ref Range   SARS Coronavirus 2 NEGATIVE NEGATIVE    Comment: (NOTE) SARS-CoV-2 target nucleic acids are NOT DETECTED.  The SARS-CoV-2 RNA is generally detectable in upper and lower respiratory specimens during the acute phase of infection. The lowest concentration of SARS-CoV-2 viral copies this assay can detect is 250 copies / mL. A negative result does not preclude SARS-CoV-2 infection and should not be used as the sole basis for treatment or other patient management decisions.  A negative result may occur with improper specimen collection / handling, submission of specimen other than nasopharyngeal swab, presence of viral mutation(s) within the areas targeted by this assay, and inadequate number of viral copies (<250 copies / mL). A negative result must be combined with clinical observations, patient history, and epidemiological information.  Fact Sheet for Patients:   StrictlyIdeas.no  Fact Sheet for Healthcare Providers: BankingDealers.co.za  This test is not yet approved or  cleared by the [place] FDA and has been authorized for detection and/or diagnosis of SARS-CoV-2 by FDA under an Emergency Use Authorization (EUA).  This EUA will remain in effect (meaning  this test can be used) for the duration of the COVID-19 declaration under Section 564(b)(1) of the Act, 21 U.S.C. section 360bbb-3(b)(1), unless the authorization is terminated or revoked sooner.  Performed at [facility], [place] [address] [place], [place] [address]   POC occult blood, ED     Status: None   Collection Time: 02/28/20  8:12 PM  Result Value Ref Range   Fecal Occult Bld NEGATIVE NEGATIVE  Lactic acid, plasma     Status: None   Collection Time: 02/29/20  8:18 AM  Result Value Ref Range   Lactic Acid, Venous 0.7 0.5 - 1.9 mmol/L    Comment: Performed at [facility], [place] [address], [place], [place] [address]   CT Abdomen Pelvis W Contrast  Result Date: 02/28/2020 CLINICAL DATA:  Acute abdominal pain. Difficulty urinating. Small amount of blood in the rectum. EXAM: CT ABDOMEN AND PELVIS WITH CONTRAST TECHNIQUE: Multidetector CT imaging of the abdomen and pelvis was performed using the standard protocol following bolus administration of intravenous contrast. CONTRAST:  153m OMNIPAQUE IOHEXOL 300 MG/ML  SOLN COMPARISON:  12/29/2019 FINDINGS: Lower chest: Mild cardiac enlargement. Coronary artery calcifications. Fibrosis and honeycomb changes in the lung bases. Small esophageal hiatal hernia. Hepatobiliary: No focal liver abnormality is seen. No gallstones, gallbladder wall thickening, or biliary dilatation. Pancreas: Unremarkable. No pancreatic ductal dilatation or surrounding inflammatory changes. Spleen: Normal in size without focal abnormality. Adrenals/Urinary Tract: Adrenal glands are unremarkable. Kidneys are normal, without renal calculi, focal lesion, or hydronephrosis. Bladder is decompressed with a Foley catheter. Stomach/Bowel: Diverticulum off of the cardiac region of the stomach. Stomach is decompressed without wall thickening. Small bowel are mostly decompressed with scattered fluid. No wall thickening. Scattered stool throughout the  colon without colonic distention. Diverticulosis of the sigmoid colon. The rectosigmoid junction demonstrates wall thickening and edema with adjacent stranding. 3.1 cm diameter cystic collection suggested along the lateral wall of the rectosigmoid junction. The appearance suggests focal acute diverticulitis with an abscess. An underlying mass lesion could also have this appearance and should be excluded in follow-up. Vascular/Lymphatic: Aortic atherosclerosis. No enlarged abdominal or pelvic lymph nodes. Reproductive: Uterus and bilateral adnexa are unremarkable. Other: No abdominal wall hernia or abnormality.  No abdominopelvic ascites. Musculoskeletal: Degenerative changes in the spine and hips. IMPRESSION: 1. Diverticulosis of the sigmoid colon with wall thickening, edema and adjacent stranding. 3.1 cm diameter cystic collection suggested along the lateral wall of the rectosigmoid junction. The appearance suggests focal acute diverticulitis with an abscess. An underlying mass lesion could also have this appearance and should be excluded in follow-up. 2. Small esophageal hiatal hernia. 3. Fibrosis and honeycomb changes in the lung bases. 4. Aortic atherosclerosis. 5. Foley catheter in the bladder. 6. Diverticulum off of the cardiac region of the stomach. 7. Degenerative changes in the spine and hips. Aortic Atherosclerosis (ICD10-I70.0). Electronically Signed   By: Lucienne Capers M.D.   On: 02/28/2020 21:50   DG Chest Port 1 View  Result Date: 02/28/2020 CLINICAL DATA:  Oxygen desaturation EXAM: PORTABLE CHEST 1 VIEW COMPARISON:  05/24/2016 FINDINGS: Diffuse coarse reticular interstitial opacities suspicious for fibrosis. No consolidation or effusion. Stable cardiomediastinal silhouette with aortic atherosclerosis. No pneumothorax IMPRESSION: Diffuse coarse reticular interstitial opacities suspicious for pulmonary fibrosis. No definite acute airspace disease. Electronically Signed   By: Donavan Foil M.D.    On: 02/28/2020 23:18    Assessment/Plan GERD Hypothyroidism Diabetes mellitus, type II  Acute diverticulitis with abscess - afebrile, WBC 11.2 - small 3 cm abscess, may not be amenable to IR drainage but will ask them to review - continue IV abx, currently Rocephin/flagyl.  - PRN analgesics and antiemetics  FEN: NPO - ok for CLD from surgical perspective if IR unable to drain abscess VTE: SCD's, Lovenox Foley: placed in ED for retention  Dispo: admit to medical service for non-operative management of diverticulitis   Jill Alexanders, [facility] Surgery Please see Amion for pager number during day hours 7:00am-4:30pm 02/29/2020, 9:46 AM

## 2020-02-29 NOTE — Progress Notes (Signed)
Pharmacy Antibiotic Note  Shelia Black is a 87 y.o. female admitted on 02/28/2020 with abdominal pain, difficulty urinating and noticing some blood in her stool earlier today.Marland Kitchen  Pharmacy has been consulted to dose abx for intra-abdominal infection. PCN allergy: she has tolerated ceftriaxone & keflex in the past.   Plan: DC Cipro Ceftriaxone 2 gm IV q24 Continue Flagyl 500 mg IV q8h Pharmacy to sign off  Height: 5\' 4"  (162.6 cm) Weight: 61.2 kg (135 lb) IBW/kg (Calculated) : 54.7  Temp (24hrs), Avg:99.5 F (37.5 C), Min:98.7 F (37.1 C), Max:100.2 F (37.9 C)  Recent Labs  Lab 02/28/20 1543 02/28/20 1852  WBC 11.2*  --   CREATININE 1.03*  --   LATICACIDVEN  --  1.5    Estimated Creatinine Clearance: 27.6 mL/min (A) (by C-G formula based on SCr of 1.03 mg/dL (H)).    Allergies  Allergen Reactions  . Oxycodone-Acetaminophen Nausea Only  . Penicillins Hives and Rash    Has patient had a PCN reaction causing immediate rash, facial/tongue/throat swelling, SOB or lightheadedness with hypotension: no Has patient had a PCN reaction causing severe rash involving mucus membranes or skin necrosis: unknown Has patient had a PCN reaction that required hospitalization : unknown Has patient had a PCN reaction occurring within the last 10 years: no If all of the above answers are "NO", then may proceed with Cephalosporin use.   . Codeine     Nausea and vomiting  . Percocet [Oxycodone-Acetaminophen] Nausea And Vomiting  . Tramadol Nausea And Vomiting     Thank you for allowing pharmacy to be a part of this patient's care.  03/01/20, Pharm.D 02/29/2020 8:26 AM

## 2020-02-29 NOTE — Progress Notes (Signed)
Pharmacy Antibiotic Note  Shelia Black is a 87 y.o. female admitted on 02/28/2020 with abdominal pain, difficulty urinating and noticing some blood in her stool earlier today.Marland Kitchen  Pharmacy has been consulted to dose cipro for intra-abdominal infection  Plan: cipro 400mg  IV q24h Follow renal function and clinical course  Height: 5\' 4"  (162.6 cm) Weight: 61.2 kg (135 lb) IBW/kg (Calculated) : 54.7  Temp (24hrs), Avg:99.5 F (37.5 C), Min:98.7 F (37.1 C), Max:100.2 F (37.9 C)  Recent Labs  Lab 02/28/20 1543 02/28/20 1852  WBC 11.2*  --   CREATININE 1.03*  --   LATICACIDVEN  --  1.5    Estimated Creatinine Clearance: 27.6 mL/min (A) (by C-G formula based on SCr of 1.03 mg/dL (H)).    Allergies  Allergen Reactions  . Oxycodone-Acetaminophen Nausea Only  . Penicillins Hives and Rash    Has patient had a PCN reaction causing immediate rash, facial/tongue/throat swelling, SOB or lightheadedness with hypotension: no Has patient had a PCN reaction causing severe rash involving mucus membranes or skin necrosis: unknown Has patient had a PCN reaction that required hospitalization : unknown Has patient had a PCN reaction occurring within the last 10 years: no If all of the above answers are "NO", then may proceed with Cephalosporin use.   . Codeine     Nausea and vomiting  . Percocet [Oxycodone-Acetaminophen] Nausea And Vomiting  . Tramadol Nausea And Vomiting     Thank you for allowing pharmacy to be a part of this patient's care.  03/01/20 RPh 02/29/2020, 4:45 AM

## 2020-02-29 NOTE — ED Notes (Signed)
MD made aware of pt's BP 97/52.

## 2020-02-29 NOTE — H&P (Addendum)
History and Physical    Shelia Black WHQ:[id number] DOB: 08-Dec-1921 DOA: 02/28/2020  PCP: [name], PA-C    Patient coming from: Home  Chief Complaint: Lower abdominal pain and bleeding per rectum.   HPI: Shelia Black is a 87 y.o. female DM, GERD, COPD, pulmonary fibrosis not on home O2 presented with abdominal pain, difficulty urinating and noticing some blood in her stool earlier today.   Daughter reports not doing well for the past 1 week with increasing fatigue. Started complaining of lower abdominal pain yesterday.  Daughter thought was likely a urinary tract infection and went to PCPs office for urine sample however, patient was unable to urinate there.  Also her blood pressures were noted to be in 90s therefore PCP decided to send patient to the ED for further evaluation.  Denies any headache or chest pain. No reported fevers or cough. No vomiting or diarrhea noted. Family states she has a history of diverticulosis in the past and is concerned this may be recurrent   ED Course:  T-max 100.2 Blood pressure 87/32, pulse 71, RR 30, SPO2 97% room air -Sodium 133, potassium 5.1, bicarb 26, serum glucose 181 -BUN 21, creatinine 1.03 -Albumin 3.2.  LFTs and lipase normal -WBC 11, Hb 11, platelets 331 -Lactic acid 1.5  -UA negative for UTI -CT abdomen sigmoid diverticulitis with 3.1 cm abscess.  Honeycomb changes in the lung.  ED meds: -Ciprofloxacin and Flagyl along with 100 cc of normal saline.  Review of Systems: As per HPI otherwise 10 point review of systems negative.    Past Medical History:  Diagnosis Date  . Arthritis   . Cataract   . Chronic cough   . COPD (chronic obstructive pulmonary disease) ([place])   . Diabetes mellitus without complication ([place])   . Diverticulitis   . Diverticulosis   . GERD (gastroesophageal reflux disease)   . Hepatic steatosis   . Hiatal hernia   . Hypothyroidism   . ILD (interstitial lung disease) ([place])   .  Pulmonary fibrosis ([place])   . Thyroid disease   . Vertigo     Past Surgical History:  Procedure Laterality Date  . APPENDECTOMY    . INTRAOCULAR LENS INSERTION  1997  . REPLACEMENT TOTAL KNEE BILATERAL    . SHOULDER SURGERY Right    tendonitis     reports that she has never smoked. She has never used smokeless tobacco. She reports that she does not drink alcohol and does not use drugs.  Allergies  Allergen Reactions  . Oxycodone-Acetaminophen Nausea Only  . Penicillins Hives and Rash    Has patient had a PCN reaction causing immediate rash, facial/tongue/throat swelling, SOB or lightheadedness with hypotension: no Has patient had a PCN reaction causing severe rash involving mucus membranes or skin necrosis: unknown Has patient had a PCN reaction that required hospitalization : unknown Has patient had a PCN reaction occurring within the last 10 years: no If all of the above answers are "NO", then may proceed with Cephalosporin use.   . Codeine     Nausea and vomiting  . Percocet [Oxycodone-Acetaminophen] Nausea And Vomiting  . Tramadol Nausea And Vomiting    Family History  Problem Relation Age of Onset  . Diabetes Mother   . Kidney cancer Mother   . Hypertension Father   . Supraventricular tachycardia Father   . Skin cancer Father   . Diabetes Sister     Prior to Admission medications   Medication Sig Start Date End Date Taking?  Authorizing Provider  acetaminophen (TYLENOL) 500 MG tablet Take 500-1,000 mg by mouth every 6 (six) hours as needed for moderate pain or fever.     [provider]  albuterol (PROVENTIL HFA;VENTOLIN HFA) 108 (90 Base) MCG/ACT inhaler Inhale 1 puff into the lungs every 6 (six) hours as needed for wheezing or shortness of breath. 05/27/16   Doreatha Lew, MD  ammonium lactate (LAC-HYDRIN) 12 % lotion Apply 1 application topically daily as needed for dry skin.     [provider]  Ascorbic Acid (VITAMIN C) 1000 MG tablet Take  1,000 mg by mouth daily.    [provider]  chlorpheniramine-HYDROcodone (TUSSIONEX) 10-8 MG/5ML SUER Take 5 mLs by mouth every 12 (twelve) hours as needed for cough.  01/14/18   [provider]  Cholecalciferol (VITAMIN D-3) 5000 units TABS Take 1 tablet by mouth daily.    [provider]  diphenoxylate-atropine (LOMOTIL) 2.5-0.025 MG tablet Take 2 tablets by mouth 4 (four) times daily as needed for diarrhea or loose stools. diarrhea 11/11/12   [provider]  glycopyrrolate (ROBINUL) 2 MG tablet Take 1 tablet (2 mg total) by mouth in the morning. 12/16/19   Esterwood, Amy S, PA-C  levothyroxine (SYNTHROID) 75 MCG tablet Take 75 mcg by mouth daily before breakfast.    [provider]  metFORMIN (GLUCOPHAGE) 500 MG tablet Take 1 tablet by mouth daily. Patient not taking: Reported on 12/16/2019    [provider]  omeprazole (PRILOSEC) 40 MG capsule Take 1 capsule (40 mg total) by mouth daily. 03/15/18   Eugenie Filler, MD  Zinc 50 MG CAPS Take by mouth daily.    [provider]    Physical Exam: Vitals:   02/29/20 0417 02/29/20 0530 02/29/20 0645 02/29/20 0700  BP: (!) 104/43 (!) 113/44 (!) 95/45 (!) 87/32  Pulse: 70 76 76 71  Resp: (!) 28 (!) 23 (!) 28 (!) 30  Temp:      TempSrc:      SpO2: 100% 100% 100% 97%  Weight:      Height:        Constitutional: NAD, calm, comfortable Sick appearing   Vitals:   02/29/20 0417 02/29/20 0530 02/29/20 0645 02/29/20 0700  BP: (!) 104/43 (!) 113/44 (!) 95/45 (!) 87/32  Pulse: 70 76 76 71  Resp: (!) 28 (!) 23 (!) 28 (!) 30  Temp:      TempSrc:      SpO2: 100% 100% 100% 97%  Weight:      Height:       Eyes: PERRL, lids and conjunctivae normal ENMT: Mucous membranes are moist. Posterior pharynx clear of any exudate or lesions.Normal dentition.  Neck: normal, supple, no masses, no thyromegaly No JVD  Respiratory: clear to auscultation bilaterally, no wheezing, no crackles.  Normal respiratory effort. No accessory muscle use.  Cardiovascular: Regular rate and rhythm,systolic murmur over mitral area.    Abdomen:tenderness in LLQ with guarding and rebound. BS active.    Musculoskeletal: no clubbing / cyanosis. No joint deformity upper and lower extremities. Good ROM, no contractures. Normal muscle tone.  Skin: no rashes, lesions, ulcers. No induration Neurologic: CN 2-12 grossly intact. Sensation intact, DTR normal. Strength 5/5 in all 4.  Psychiatric: Normal judgment and insight. Alert and oriented x 3. Normal mood.   Labs on Admission: I have personally reviewed following labs and imaging studies  CBC: Recent Labs  Lab 02/28/20 1543  WBC 11.2*  HGB 11.0*  HCT 34.3*  MCV 102.4*  PLT 443   Basic Metabolic Panel: Recent Labs  Lab 02/28/20 1543  NA 133*  K 5.1  CL 97*  CO2 26  GLUCOSE 181*  BUN 21  CREATININE 1.03*  CALCIUM 8.9   GFR: Estimated Creatinine Clearance: 27.6 mL/min (A) (by C-G formula based on SCr of 1.03 mg/dL (H)). Liver Function Tests: Recent Labs  Lab 02/28/20 1543  AST 28  ALT 26  ALKPHOS 80  BILITOT 0.5  PROT 7.3  ALBUMIN 3.2*   Recent Labs  Lab 02/28/20 1543  LIPASE 17   Urine analysis:    Component Value Date/Time   COLORURINE YELLOW 02/28/2020 1850   APPEARANCEUR CLEAR 02/28/2020 1850   LABSPEC 1.013 02/28/2020 1850   PHURINE 5.0 02/28/2020 1850   GLUCOSEU NEGATIVE 02/28/2020 1850   HGBUR NEGATIVE 02/28/2020 1850   BILIRUBINUR NEGATIVE 02/28/2020 1850   KETONESUR NEGATIVE 02/28/2020 1850   PROTEINUR NEGATIVE 02/28/2020 1850   UROBILINOGEN 0.2 04/29/2014 0856   NITRITE NEGATIVE 02/28/2020 1850   LEUKOCYTESUR NEGATIVE 02/28/2020 1850    Radiological Exams on Admission: CT Abdomen Pelvis W Contrast  Result Date: 02/28/2020 CLINICAL DATA:  Acute abdominal pain. Difficulty urinating. Small amount of blood in the rectum. EXAM: CT ABDOMEN AND PELVIS WITH CONTRAST TECHNIQUE: Multidetector CT imaging of  the abdomen and pelvis was performed using the standard protocol following bolus administration of intravenous contrast. CONTRAST:  18m OMNIPAQUE IOHEXOL 300 MG/ML  SOLN COMPARISON:  12/29/2019 FINDINGS: Lower chest: Mild cardiac enlargement. Coronary artery calcifications. Fibrosis and honeycomb changes in the lung bases. Small esophageal hiatal hernia. Hepatobiliary: No focal liver abnormality is seen. No gallstones, gallbladder wall thickening, or biliary dilatation. Pancreas: Unremarkable. No pancreatic ductal dilatation or surrounding inflammatory changes. Spleen: Normal in size without focal abnormality. Adrenals/Urinary Tract: Adrenal glands are unremarkable. Kidneys are normal, without renal calculi, focal lesion, or hydronephrosis. Bladder is decompressed with a Foley catheter. Stomach/Bowel: Diverticulum off of the cardiac region of the stomach. Stomach is decompressed without wall thickening. Small bowel are mostly decompressed with scattered fluid. No wall thickening. Scattered stool throughout the colon without colonic distention. Diverticulosis of the sigmoid colon. The rectosigmoid junction demonstrates wall thickening and edema with adjacent stranding. 3.1 cm diameter cystic collection suggested along the lateral wall of the rectosigmoid junction. The appearance suggests focal acute diverticulitis with an abscess. An underlying mass lesion could also have this appearance and should be excluded in follow-up. Vascular/Lymphatic: Aortic atherosclerosis. No enlarged abdominal or pelvic lymph nodes. Reproductive: Uterus and bilateral adnexa are unremarkable. Other: No abdominal wall hernia or abnormality. No abdominopelvic ascites. Musculoskeletal: Degenerative changes in the spine and hips. IMPRESSION: 1. Diverticulosis of the sigmoid colon with wall thickening, edema and adjacent stranding. 3.1 cm diameter cystic collection suggested along the lateral wall of the rectosigmoid junction. The appearance  suggests focal acute diverticulitis with an abscess. An underlying mass lesion could also have this appearance and should be excluded in follow-up. 2. Small esophageal hiatal hernia. 3. Fibrosis and honeycomb changes in the lung bases. 4. Aortic atherosclerosis. 5. Foley catheter in the bladder. 6. Diverticulum off of the cardiac region of the stomach. 7. Degenerative changes in the spine and hips. Aortic Atherosclerosis (ICD10-I70.0). Electronically Signed   By: WLucienne CapersM.D.   On: 02/28/2020 21:50   DG Chest Port 1 View  Result Date: 02/28/2020 CLINICAL DATA:  Oxygen desaturation EXAM: PORTABLE CHEST 1 VIEW COMPARISON:  05/24/2016 FINDINGS: Diffuse coarse reticular interstitial opacities suspicious for fibrosis. No consolidation or effusion. Stable  cardiomediastinal silhouette with aortic atherosclerosis. No pneumothorax IMPRESSION: Diffuse coarse reticular interstitial opacities suspicious for pulmonary fibrosis. No definite acute airspace disease. Electronically Signed   By: Donavan Foil M.D.   On: 02/28/2020 23:18    EKG: Independently reviewed. Pending   Assessment/Plan Active Problems:   Diverticulitis of colon with bleeding   Diabetes mellitus type 2 in obese (HCC)   Sepsis ([place])   ILD (interstitial lung disease) ([place])   Pulmonary fibrosis (HCC)   GERD (gastroesophageal reflux disease)   Sigmoid diverticulitis  #Acute sigmoid diverticulitis with abscess #Sepsis -Surgery consulted. Will attempt CT guided drainage through IR, if possible.  -Otherwise c/w CTX and flagyl. -NPO. Gentle IVF given her tenuous resp status.    #Pulmonary fibrosis on 2LNC -CXR with opacities c/w fibrosis. BNP and EKG pending.   #DM -F/u HbA1c -Was on metformin till recently.    #Fatigue -F/u TSH.   #GERD   DVT prophylaxis: subQ heparin   Code Status: Full code Family Communication: Dtr [name].   Disposition Plan: Home PT vs SNF.   Consults called: General surgery   Admission status: inpt and SDU bed.    Kharter Brew MD [facility]   If 7PM-7AM, please contact night-coverage [email or web address] Password [facility]  02/29/2020, 7:46 AM

## 2020-03-01 DIAGNOSIS — K5792 Diverticulitis of intestine, part unspecified, without perforation or abscess without bleeding: Secondary | ICD-10-CM | POA: Diagnosis not present

## 2020-03-01 LAB — APTT: aPTT: 30 seconds (ref 24–36)

## 2020-03-01 LAB — COMPREHENSIVE METABOLIC PANEL
ALT: 17 U/L (ref 0–44)
AST: 20 U/L (ref 15–41)
Albumin: 2.8 g/dL — ABNORMAL LOW (ref 3.5–5.0)
Alkaline Phosphatase: 58 U/L (ref 38–126)
Anion gap: 9 (ref 5–15)
BUN: 12 mg/dL (ref 8–23)
CO2: 26 mmol/L (ref 22–32)
Calcium: 8.4 mg/dL — ABNORMAL LOW (ref 8.9–10.3)
Chloride: 104 mmol/L (ref 98–111)
Creatinine, Ser: 0.87 mg/dL (ref 0.44–1.00)
GFR, Estimated: >60 mL/min (ref >60)
Glucose, Bld: 92 mg/dL (ref 70–99)
Potassium: 4 mmol/L (ref 3.5–5.1)
Sodium: 139 mmol/L (ref 135–145)
Total Bilirubin: 0.5 mg/dL (ref 0.3–1.2)
Total Protein: 6.1 g/dL — ABNORMAL LOW (ref 6.5–8.1)

## 2020-03-01 LAB — TSH: TSH: 5.492 u[IU]/mL — ABNORMAL HIGH (ref 0.350–4.500)

## 2020-03-01 LAB — PROTIME-INR
INR: 1.3 — ABNORMAL HIGH (ref 0.8–1.2)
Prothrombin Time: 15.4 seconds — ABNORMAL HIGH (ref 11.4–15.2)

## 2020-03-01 LAB — CBG MONITORING, ED
Glucose-Capillary: 91 mg/dL (ref 70–99)
Glucose-Capillary: 84 mg/dL (ref 70–99)
Glucose-Capillary: 123 mg/dL — ABNORMAL HIGH (ref 70–99)
Glucose-Capillary: 103 mg/dL — ABNORMAL HIGH (ref 70–99)

## 2020-03-01 LAB — BRAIN NATRIURETIC PEPTIDE: B Natriuretic Peptide: 625.9 pg/mL — ABNORMAL HIGH (ref 0.0–100.0)

## 2020-03-01 LAB — HEMOGLOBIN A1C
Hgb A1c MFr Bld: 6.3 % — ABNORMAL HIGH (ref 4.8–5.6)
Hgb A1c MFr Bld: 6.3 % — ABNORMAL HIGH (ref 4.8–5.6)
Mean Plasma Glucose: 134.11 mg/dL
Mean Plasma Glucose: 134.11 mg/dL

## 2020-03-01 LAB — POC OCCULT BLOOD, ED: Fecal Occult Bld: NEGATIVE

## 2020-03-01 MED ORDER — SODIUM CHLORIDE 0.9 % IV BOLUS
500.0000 mL | Freq: Once | INTRAVENOUS | Status: AC
  Administered 2020-03-01: 500 mL via INTRAVENOUS

## 2020-03-01 MED ORDER — GLYCOPYRROLATE 1 MG PO TABS
2.0000 mg | ORAL_TABLET | Freq: Every morning | ORAL | Status: DC
  Administered 2020-03-01 – 2020-03-10 (×10): 2 mg via ORAL
  Filled 2020-03-01 (×11): qty 2

## 2020-03-01 MED ORDER — SODIUM CHLORIDE 0.9 % IV SOLN: INTRAVENOUS | Status: DC

## 2020-03-01 MED ORDER — HYDROCOD POLST-CPM POLST ER 10-8 MG/5ML PO SUER: 5.0000 mL | Freq: Two times a day (BID) | ORAL | Status: DC | PRN

## 2020-03-01 NOTE — ED Notes (Signed)
Staff offered assistance with breakfast tray, however pt stated that she was not hungry and did not want anything to eat at that time; RN is aware.

## 2020-03-01 NOTE — ED Notes (Signed)
MD notified of pt's BP trending down.

## 2020-03-01 NOTE — Progress Notes (Addendum)
    JJ:HERDE abdominal pain and bleeding per rectum.   Subjective: Still on a gurney in the ED, complains of her right knee hurting, she has had previous surgery there.  She doesn't know if she had any liquids or not.  No abdominal complaints.  She has had a BM per staff, but no blood seen.  Objective: Vital signs in last 24 hours: Temp:  [99.3 F (37.4 C)] 99.3 F (37.4 C) (01/25 1808) Pulse Rate:  [52-79] 58 (01/26 0645) Resp:  [18-34] 25 (01/26 0645) BP: (89-110)/(38-56) 97/45 (01/26 0645) SpO2:  [93 %-100 %] 98 % (01/26 0645)  NO I/O BP 90's; RR in the upper 20's; Sats OK CMP OK BNP 625 INR 1.3 TSH 5.4 CT 1/24:  Sigmoid diverticulosis, sigmoid, edema, 3.1 cm cystic collection, small hiatal hernia, foley in bladder.  Fibrosis/honeycomb changes lung base. CXR: Diffuse coarse reticular interstitial opacities suspicious for pulmonary fibrosis. No definite acute airspace disease.  Intake/Output from previous day: 01/25 0701 - 01/26 0700 In: 2102 [I.V.:400; IV Piggyback:1702] Out: -  Intake/Output this shift: No intake/output data recorded.  General appearance: alert and very uncomforable on gurney with knee, and very hard of hearing Resp: few rales in the bases GI: soft, non-tender; bowel sounds normal; no masses,  no organomegaly  Lab Results:  Recent Labs    02/28/20 1543  WBC 11.2*  HGB 11.0*  HCT 34.3*  PLT 331    BMET Recent Labs    02/28/20 1543 03/01/20 0408  NA 133* 139  K 5.1 4.0  CL 97* 104  CO2 26 26  GLUCOSE 181* 92  BUN 21 12  CREATININE 1.03* 0.87  CALCIUM 8.9 8.4*   PT/INR Recent Labs    03/01/20 0408  LABPROT 15.4*  INR 1.3*    Recent Labs  Lab 02/28/20 1543 03/01/20 0408  AST 28 20  ALT 26 17  ALKPHOS 80 58  BILITOT 0.5 0.5  PROT 7.3 6.1*  ALBUMIN 3.2* 2.8*     Lipase     Component Value Date/Time   LIPASE 17 02/28/2020 1543     Medications: . heparin  5,000 Units Subcutaneous Q8H  . insulin aspart  0-9 Units  Subcutaneous TID WC  . ipratropium  0.5 mg Nebulization Q6H  . levothyroxine  75 mcg Oral Q0600  . pantoprazole  40 mg Oral Daily    Assessment/Plan Urinary retention  - foley in GERD Hypothyroidism Diabetes mellitus, type II COPD/pulmonary fibrosis  Acute diverticulitis with abscess - afebrile, WBC 11.2 (1/25) - small 3 cm abscess, may not be amenable to IR drainage but will ask them to review - continue IV abx, currently Rocephin/flagyl.  - PRN analgesics and antiemetics  FEN: NPO - ok for CLD from surgical perspective if IR unable to drain abscess VTE: SCD's, Lovenox Foley: placed in ED for retention   Plan:  IR has been ask to see and evaluate for drain, I will give her some sips with meds for now, and after IR has completed plans we can give her clears.  Check stool for blood. OOB to chair at least.  Recheck labs in AM.   I called IR and the report is no cutaneous access for drain, so I will put her on clears.     LOS: 1 day    Corita Allinson 03/01/2020 Please see Amion

## 2020-03-01 NOTE — ED Notes (Signed)
Pt's daughter assisted pt with lunch tray.

## 2020-03-01 NOTE — ED Notes (Addendum)
Pt cleaned, peri care/foley care performed, and new brief applied to pt.

## 2020-03-01 NOTE — Progress Notes (Signed)
PROGRESS NOTE   Shelia Black  WFU:[id number] DOB: 06-30-1921 DOA: 02/28/2020 PCP: [name], PA-C  Brief Narrative:  87 year old white female history of degenerative disc disease, COPD, reflux, IPF on chronic steroids, diverticular disease CKD 3 Prior ischemic colitis and other multiple admissions for diverticulitis 03/12/2018 with bleeding seen by [facility] gastroenterology Hypothyroidism  Represented 02/29/2020 lower abdominal pain bleeding per rectum increasing fatigue and blood in stool Work-up revealed T-max 100.2 blood pressure soft 87/32 lactic acid 1.5 CT abdomen pelvis sigmoid diverticulitis +3.1 cm abscess Started on Cipro Flagyl with normal saline General surgery consulted  Assessment & Plan:   Active Problems:   Diverticulitis of colon with bleeding   Diabetes mellitus type 2 in obese (HCC)   Sepsis ([place])   ILD (interstitial lung disease) ([place])   Pulmonary fibrosis (HCC)   GERD (gastroesophageal reflux disease)   Sigmoid diverticulitis   1. Likely diverticular abscess 3.1 cm a. Per IR abscess not amenable to percutaneous drainage b. Defer to general surgery regarding diet and imaging etc. c. Continuing ceftriaxone and metronidazole at this time IV d. Placed on saline 40 cc an hour 2. COPD/IPF on chronic steroids baseline uses 2 L at home a. Continue oxygen b. Previously has been on steroids at baseline-continue Robinul 2 mg a.m. as well of Tussionex 5 mils every 12 cough, can continue albuterol as needed 3. Mild AKI superimposed on CKD 2 a. Monitor trends which are improving 4. Hypothyroidism a. Continue Synthroid 50 every morning 5. Degenerative disc disease 6. DM TY 2 a. At home is on Metformin 500 daily which has been held b. Monitor on sliding scale only 7. Reflux a. Continue Prilosec 40 daily  DVT prophylaxis: Lovenox Code Status: Full CODE STATUS Family Communication: None Disposition:  Status is: Inpatient  Remains inpatient appropriate  because:Hemodynamically unstable, Unsafe d/c plan, IV treatments appropriate due to intensity of illness or inability to take PO and Inpatient level of care appropriate due to severity of illness   Dispo: The patient is from: Home              Anticipated d/c is to: Home              Anticipated d/c date is: 3 days              Patient currently is not medically stable to d/c.   Difficult to place patient No       Consultants:   General surgery  Interventional radiology  Procedures: None yet  Antimicrobials: Ciprofloxacin and Flagyl   Subjective: Awake coherent but extremely hard of hearing Tells me not had much abdominal pain Main complaint is severe right knee which is paining her because of positioning Has had some fluids this morning Does not seem to recall discussion with surgery  Objective: Vitals:   03/01/20 0430 03/01/20 0515 03/01/20 0600 03/01/20 0645  BP: (!) 90/42 (!) 93/43 (!) 96/45 (!) 97/45  Pulse: 66 65 61 (!) 58  Resp: (!) 27 (!) 26 (!) 24 (!) 25  Temp:      TempSrc:      SpO2: 97% 99% 98% 98%  Weight:      Height:        Intake/Output Summary (Last 24 hours) at 03/01/2020 0710 Last data filed at 03/01/2020 0346 Gross per 24 hour  Intake 2101.95 ml  Output --  Net 2101.95 ml   Filed Weights   02/28/20 1544  Weight: 61.2 kg    Examination: Awake coherent alert but  quite hard of hearing EOMI NCAT Neck soft supple [id number] holosystolic murmur heard best at left lower sternal edge Abdomen soft no rebound Foley catheter in place Right knee swollen but no fluctuance Power 5/5 to lower extremities   Data Reviewed: I have personally reviewed following labs and imaging studies  BUNs/creatinine 21/1.03-creatinine +12/0.8 Potassium 5.1-->4.0    COVID-19 Labs  No results for input(s): DDIMER, FERRITIN, LDH, CRP in the last 72 hours.  Lab Results  Component Value Date   [place] NEGATIVE 02/28/2020   [place] NEGATIVE 10/05/2018    [place] NEGATIVE 08/05/2018   [place] NOT DETECTED 06/09/2018     Radiology Studies: CT Abdomen Pelvis W Contrast  Result Date: 02/28/2020 CLINICAL DATA:  Acute abdominal pain. Difficulty urinating. Small amount of blood in the rectum. EXAM: CT ABDOMEN AND PELVIS WITH CONTRAST TECHNIQUE: Multidetector CT imaging of the abdomen and pelvis was performed using the standard protocol following bolus administration of intravenous contrast. CONTRAST:  144m OMNIPAQUE IOHEXOL 300 MG/ML  SOLN COMPARISON:  12/29/2019 FINDINGS: Lower chest: Mild cardiac enlargement. Coronary artery calcifications. Fibrosis and honeycomb changes in the lung bases. Small esophageal hiatal hernia. Hepatobiliary: No focal liver abnormality is seen. No gallstones, gallbladder wall thickening, or biliary dilatation. Pancreas: Unremarkable. No pancreatic ductal dilatation or surrounding inflammatory changes. Spleen: Normal in size without focal abnormality. Adrenals/Urinary Tract: Adrenal glands are unremarkable. Kidneys are normal, without renal calculi, focal lesion, or hydronephrosis. Bladder is decompressed with a Foley catheter. Stomach/Bowel: Diverticulum off of the cardiac region of the stomach. Stomach is decompressed without wall thickening. Small bowel are mostly decompressed with scattered fluid. No wall thickening. Scattered stool throughout the colon without colonic distention. Diverticulosis of the sigmoid colon. The rectosigmoid junction demonstrates wall thickening and edema with adjacent stranding. 3.1 cm diameter cystic collection suggested along the lateral wall of the rectosigmoid junction. The appearance suggests focal acute diverticulitis with an abscess. An underlying mass lesion could also have this appearance and should be excluded in follow-up. Vascular/Lymphatic: Aortic atherosclerosis. No enlarged abdominal or pelvic lymph nodes. Reproductive: Uterus and bilateral adnexa are unremarkable. Other: No  abdominal wall hernia or abnormality. No abdominopelvic ascites. Musculoskeletal: Degenerative changes in the spine and hips. IMPRESSION: 1. Diverticulosis of the sigmoid colon with wall thickening, edema and adjacent stranding. 3.1 cm diameter cystic collection suggested along the lateral wall of the rectosigmoid junction. The appearance suggests focal acute diverticulitis with an abscess. An underlying mass lesion could also have this appearance and should be excluded in follow-up. 2. Small esophageal hiatal hernia. 3. Fibrosis and honeycomb changes in the lung bases. 4. Aortic atherosclerosis. 5. Foley catheter in the bladder. 6. Diverticulum off of the cardiac region of the stomach. 7. Degenerative changes in the spine and hips. Aortic Atherosclerosis (ICD10-I70.0). Electronically Signed   By: WLucienne CapersM.D.   On: 02/28/2020 21:50   DG Chest Port 1 View  Result Date: 02/28/2020 CLINICAL DATA:  Oxygen desaturation EXAM: PORTABLE CHEST 1 VIEW COMPARISON:  05/24/2016 FINDINGS: Diffuse coarse reticular interstitial opacities suspicious for fibrosis. No consolidation or effusion. Stable cardiomediastinal silhouette with aortic atherosclerosis. No pneumothorax IMPRESSION: Diffuse coarse reticular interstitial opacities suspicious for pulmonary fibrosis. No definite acute airspace disease. Electronically Signed   By: KDonavan FoilM.D.   On: 02/28/2020 23:18     Scheduled Meds: . glycopyrrolate  2 mg Oral q AM  . heparin  5,000 Units Subcutaneous Q8H  . insulin aspart  0-9 Units Subcutaneous TID WC  . ipratropium  0.5 mg Nebulization Q6H  .  levothyroxine  75 mcg Oral Q0600  . pantoprazole  40 mg Oral Daily   Continuous Infusions: . cefTRIAXone (ROCEPHIN)  IV Stopped (02/29/20 1231)  . metronidazole 500 mg (03/01/20 5027)     LOS: 1 day    Time spent: [place], MD [facility] To contact the attending provider between 7A-7P or the covering provider during after  hours 7P-7A, please log into the web site [email or web address] and access using universal [place] password for that web site. If you do not have the password, please call the hospital operator.  03/01/2020, 7:10 AM

## 2020-03-01 NOTE — Consult Note (Signed)
IR received consult request for sigmoid diverticular abscess aspiration and drain placement. Imaging reviewed by IR and no percutaneous access was identified. No IR procedure planned. Surgical team made aware and the order will be deleted.   Please call IR with any questions.  Alwyn Ren, [place] [phone] 03/01/2020, 8:06 AM

## 2020-03-02 DIAGNOSIS — K5792 Diverticulitis of intestine, part unspecified, without perforation or abscess without bleeding: Secondary | ICD-10-CM | POA: Diagnosis not present

## 2020-03-02 DIAGNOSIS — L0291 Cutaneous abscess, unspecified: Secondary | ICD-10-CM | POA: Diagnosis present

## 2020-03-02 LAB — CBC WITH DIFFERENTIAL/PLATELET
Abs Immature Granulocytes: 0.13 10*3/uL — ABNORMAL HIGH (ref 0.00–0.07)
Basophils Absolute: 0.1 10*3/uL (ref 0.0–0.1)
Basophils Relative: 1 %
Eosinophils Absolute: 0.3 10*3/uL (ref 0.0–0.5)
Eosinophils Relative: 3 %
HCT: 32.2 % — ABNORMAL LOW (ref 36.0–46.0)
Hemoglobin: 10.1 g/dL — ABNORMAL LOW (ref 12.0–15.0)
Immature Granulocytes: 1 %
Lymphocytes Relative: 16 %
Lymphs Abs: 1.7 10*3/uL (ref 0.7–4.0)
MCH: 33.4 pg (ref 26.0–34.0)
MCHC: 31.4 g/dL (ref 30.0–36.0)
MCV: 106.6 fL — ABNORMAL HIGH (ref 80.0–100.0)
Monocytes Absolute: 1.4 10*3/uL — ABNORMAL HIGH (ref 0.1–1.0)
Monocytes Relative: 13 %
Neutro Abs: 7.2 10*3/uL (ref 1.7–7.7)
Neutrophils Relative %: 66 %
Platelets: 374 10*3/uL (ref 150–400)
RBC: 3.02 MIL/uL — ABNORMAL LOW (ref 3.87–5.11)
RDW: 14.3 % (ref 11.5–15.5)
WBC: 10.7 10*3/uL — ABNORMAL HIGH (ref 4.0–10.5)
nRBC: 0 % (ref 0.0–0.2)

## 2020-03-02 LAB — COMPREHENSIVE METABOLIC PANEL
ALT: 22 U/L (ref 0–44)
AST: 34 U/L (ref 15–41)
Albumin: 2.8 g/dL — ABNORMAL LOW (ref 3.5–5.0)
Alkaline Phosphatase: 51 U/L (ref 38–126)
Anion gap: 11 (ref 5–15)
BUN: 10 mg/dL (ref 8–23)
CO2: 22 mmol/L (ref 22–32)
Calcium: 8.3 mg/dL — ABNORMAL LOW (ref 8.9–10.3)
Chloride: 102 mmol/L (ref 98–111)
Creatinine, Ser: 0.83 mg/dL (ref 0.44–1.00)
GFR, Estimated: >60 mL/min (ref >60)
Glucose, Bld: 99 mg/dL (ref 70–99)
Potassium: 4.1 mmol/L (ref 3.5–5.1)
Sodium: 135 mmol/L (ref 135–145)
Total Bilirubin: 0.6 mg/dL (ref 0.3–1.2)
Total Protein: 5.8 g/dL — ABNORMAL LOW (ref 6.5–8.1)

## 2020-03-02 LAB — CBG MONITORING, ED: Glucose-Capillary: 101 mg/dL — ABNORMAL HIGH (ref 70–99)

## 2020-03-02 LAB — GLUCOSE, CAPILLARY
Glucose-Capillary: 101 mg/dL — ABNORMAL HIGH (ref 70–99)
Glucose-Capillary: 94 mg/dL (ref 70–99)

## 2020-03-02 MED ORDER — IPRATROPIUM BROMIDE 0.02 % IN SOLN
0.5000 mg | Freq: Two times a day (BID) | RESPIRATORY_TRACT | Status: DC
  Administered 2020-03-03: 08:00:00 0.5 mg via RESPIRATORY_TRACT
  Filled 2020-03-02: qty 2.5

## 2020-03-02 NOTE — Plan of Care (Signed)

## 2020-03-02 NOTE — Progress Notes (Signed)
PROGRESS NOTE   Shelia Black  QJF:[id number] DOB: [birth year] DOA: 02/28/2020 PCP: [name], PA-C  Brief Narrative:  87 year old white female history of degenerative disc disease, COPD, reflux, IPF on chronic steroids, diverticular disease CKD 3 Prior ischemic colitis and other multiple admissions for diverticulitis 03/12/2018 with bleeding seen by [facility] gastroenterology Hypothyroidism  Represented 02/29/2020 lower abdominal pain bleeding per rectum increasing fatigue and blood in stool Work-up revealed T-max 100.2 blood pressure soft 87/32 lactic acid 1.5 CT abdomen pelvis sigmoid diverticulitis +3.1 cm abscess Started on Cipro Flagyl with normal saline General surgery consulted  Assessment & Plan:   Active Problems:   Diverticulitis of colon with bleeding   Diabetes mellitus type 2 in obese (HCC)   Sepsis ([place])   ILD (interstitial lung disease) ([place])   Pulmonary fibrosis (HCC)   GERD (gastroesophageal reflux disease)   Sigmoid diverticulitis   Abscess   1. Likely diverticular abscess 3.1 cm a. Per IR abscess not amenable to percutaneous drainage b. Surgery graduating to full liquids 1/27 c. Continuing ceftriaxone and metronidazole at this time IV d. Continue saline 40 cc an hour 2. COPD/IPF on chronic steroids baseline uses 2 L at home a. Continue oxygen b. Previously has been on steroids at baseline-continue Robinul 2 mg a.m. as well of Tussionex 5 mils every 12 cough, can continue albuterol as needed 3. Mild AKI superimposed on CKD 2 a. Monitor trends which are improving 4. Hypothyroidism a. Continue Synthroid 50 every morning 5. Degenerative disc disease 6. DM TY 2 a. At home is on Metformin 500 daily which has been held b. Monitor on sliding scale only 7. Reflux a. Continue Prilosec 40 daily  DVT prophylaxis: Lovenox Code Status: Full CODE STATUS Family Communication:  Called daughter Shelia Black at [phone] and updated fully Disposition:   Status is: Inpatient-patient can transition to MedSurg unit given stability and have updated the nursing staff  Remains inpatient appropriate because:IV treatments appropriate due to intensity of illness or inability to take PO   Dispo: The patient is from: Home              Anticipated d/c is to: Home              Anticipated d/c date is: 3 days              Patient currently is not medically stable to d/c.   Difficult to place patient No       Consultants:   General surgery  Interventional radiology  Procedures: None yet  Antimicrobials: Ciprofloxacin and Flagyl   Subjective: Doing fair Knee pain is better Seems to be on oxygen now No chest pain fever chills abdominal pain diarrhea Objective: Vitals:   03/02/20 0400 03/02/20 0530 03/02/20 0615 03/02/20 0645  BP: (!) 116/52 (!) 103/46 (!) 107/51   Pulse: 69 72 69 66  Resp: (!) 28 (!) 22 (!) 34 (!) 27  Temp:      TempSrc:      SpO2: 92% 93% 97% 95%  Weight:      Height:        Intake/Output Summary (Last 24 hours) at 03/02/2020 0915 Last data filed at 03/01/2020 2232 Gross per 24 hour  Intake 379.55 ml  Output --  Net 379.55 ml   Filed Weights   02/28/20 1544  Weight: 61.2 kg    Examination: Coherent no distress EOMI NCAT Neck soft supple A7-G8 holosystolic murmur heard best at left lower sternal edge Abdomen soft no rebound Foley  catheter in place Power 5/5 to lower extremities   Data Reviewed: I have personally reviewed following labs and imaging studies  BUNs/creatinine 21/1.03-creatinine +12/0.8-->10/0.8 Potassium 5.1-->4.1 White count 11.2-->10.7    COVID-19 Labs  No results for input(s): DDIMER, FERRITIN, LDH, CRP in the last 72 hours.  Lab Results  Component Value Date   [place] NEGATIVE 02/28/2020   [place] NEGATIVE 10/05/2018   [place] NEGATIVE 08/05/2018   SARSCOV2NAA NOT DETECTED 06/09/2018     Radiology Studies: No results found.   Scheduled Meds: .  glycopyrrolate  2 mg Oral q AM  . heparin  5,000 Units Subcutaneous Q8H  . insulin aspart  0-9 Units Subcutaneous TID WC  . ipratropium  0.5 mg Nebulization Q6H  . levothyroxine  75 mcg Oral Q0600  . pantoprazole  40 mg Oral Daily   Continuous Infusions: . sodium chloride 40 mL/hr at 03/01/20 1000  . cefTRIAXone (ROCEPHIN)  IV Stopped (03/01/20 1034)  . metronidazole 500 mg (03/02/20 0553)     LOS: 2 days    Time spent: 64  Nita Sells, MD [facility] To contact the attending provider between 7A-7P or the covering provider during after hours 7P-7A, please log into the web site [email or web address] and access using universal [place] password for that web site. If you do not have the password, please call the hospital operator.  03/02/2020, 9:15 AM

## 2020-03-02 NOTE — ED Notes (Addendum)
Patient had a mucus/clear small bowel movement  Patient stated that her stomach is hurting/RN notified Repositioned patient / Pillow support under heels to reduce bed sores

## 2020-03-02 NOTE — Progress Notes (Signed)
    CC: Abdominal pain  Subjective: Her biggest complaint is she is not able to sleep she is uncomfortable in the gurney in the ED day 3. She denies any abdominal pain and no pain on exam.  Objective: Vital signs in last 24 hours: Pulse Rate:  [53-73] 66 (01/27 0645) Resp:  [10-34] 27 (01/27 0645) BP: (90-116)/(43-80) 107/51 (01/27 0615) SpO2:  [92 %-100 %] 95 % (01/27 0645)  No intake/output in the ED. No temperature recorded Respiratory rate 28-32 BP stable Sats 93 to 95% on room air CMP is stable WBC 10.7  Intake/Output from previous day: 01/26 0701 - 01/27 0700 In: 379.6 [IV Piggyback:379.6] Out: -  Intake/Output this shift: No intake/output data recorded.  General appearance: alert, cooperative, no distress and Uncomfortable on the gurney in the ED. Resp: She has rales in both bases. GI: Abdomen soft she denies any tenderness on exam.  No BM or flatus reported.  Lab Results:  Recent Labs    02/28/20 1543 03/02/20 0400  WBC 11.2* 10.7*  HGB 11.0* 10.1*  HCT 34.3* 32.2*  PLT 331 374    BMET Recent Labs    03/01/20 0408 03/02/20 0400  NA 139 135  K 4.0 4.1  CL 104 102  CO2 26 22  GLUCOSE 92 99  BUN 12 10  CREATININE 0.87 0.83  CALCIUM 8.4* 8.3*   PT/INR Recent Labs    03/01/20 0408  LABPROT 15.4*  INR 1.3*    Recent Labs  Lab 02/28/20 1543 03/01/20 0408 03/02/20 0400  AST 28 20 34  ALT 26 17 22   ALKPHOS 80 58 51  BILITOT 0.5 0.5 0.6  PROT 7.3 6.1* 5.8*  ALBUMIN 3.2* 2.8* 2.8*     Lipase     Component Value Date/Time   LIPASE 17 02/28/2020 1543     Medications: . glycopyrrolate  2 mg Oral q AM  . heparin  5,000 Units Subcutaneous Q8H  . insulin aspart  0-9 Units Subcutaneous TID WC  . ipratropium  0.5 mg Nebulization Q6H  . levothyroxine  75 mcg Oral Q0600  . pantoprazole  40 mg Oral Daily    Assessment/Plan Urinary retention  - foley in GERD Hypothyroidism Diabetes mellitus, type II COPD/pulmonary  fibrosis  Acute diverticulitis with abscess - afebrile, WBC 11.2 (1/25) - small 3 cm abscess, may not be amenable to IR drainage but will ask them to review - continue IV abx, currentlyRocephin/flagyl.  - PRN analgesics and antiemetics  FEN: NPO - ok for CLD from surgical perspective if IR unable to drain abscess VTE: SCD's, Lovenox Foley: placed in ED for retention   Plan: Advance to full liquids, continue antibiotics.   LOS: 2 days    Ayjah Show 03/02/2020 Please see Amion

## 2020-03-02 NOTE — ED Notes (Signed)
Patient received lunch tray 

## 2020-03-03 DIAGNOSIS — K5792 Diverticulitis of intestine, part unspecified, without perforation or abscess without bleeding: Secondary | ICD-10-CM | POA: Diagnosis not present

## 2020-03-03 LAB — CBC WITH DIFFERENTIAL/PLATELET
Abs Immature Granulocytes: 0.24 10*3/uL — ABNORMAL HIGH (ref 0.00–0.07)
Basophils Absolute: 0.1 10*3/uL (ref 0.0–0.1)
Basophils Relative: 1 %
Eosinophils Absolute: 0.2 10*3/uL (ref 0.0–0.5)
Eosinophils Relative: 2 %
HCT: 30.8 % — ABNORMAL LOW (ref 36.0–46.0)
Hemoglobin: 10 g/dL — ABNORMAL LOW (ref 12.0–15.0)
Immature Granulocytes: 2 %
Lymphocytes Relative: 18 %
Lymphs Abs: 1.8 10*3/uL (ref 0.7–4.0)
MCH: 33.9 pg (ref 26.0–34.0)
MCHC: 32.5 g/dL (ref 30.0–36.0)
MCV: 104.4 fL — ABNORMAL HIGH (ref 80.0–100.0)
Monocytes Absolute: 1.7 10*3/uL — ABNORMAL HIGH (ref 0.1–1.0)
Monocytes Relative: 17 %
Neutro Abs: 6.1 10*3/uL (ref 1.7–7.7)
Neutrophils Relative %: 60 %
Platelets: 380 10*3/uL (ref 150–400)
RBC: 2.95 MIL/uL — ABNORMAL LOW (ref 3.87–5.11)
RDW: 14.1 % (ref 11.5–15.5)
WBC: 10.1 10*3/uL (ref 4.0–10.5)
nRBC: 0 % (ref 0.0–0.2)

## 2020-03-03 LAB — GLUCOSE, CAPILLARY
Glucose-Capillary: 102 mg/dL — ABNORMAL HIGH (ref 70–99)
Glucose-Capillary: 100 mg/dL — ABNORMAL HIGH (ref 70–99)
Glucose-Capillary: 141 mg/dL — ABNORMAL HIGH (ref 70–99)
Glucose-Capillary: 108 mg/dL — ABNORMAL HIGH (ref 70–99)

## 2020-03-03 LAB — COMPREHENSIVE METABOLIC PANEL
ALT: 17 U/L (ref 0–44)
AST: 21 U/L (ref 15–41)
Albumin: 2.4 g/dL — ABNORMAL LOW (ref 3.5–5.0)
Alkaline Phosphatase: 46 U/L (ref 38–126)
Anion gap: 9 (ref 5–15)
BUN: 7 mg/dL — ABNORMAL LOW (ref 8–23)
CO2: 23 mmol/L (ref 22–32)
Calcium: 8.2 mg/dL — ABNORMAL LOW (ref 8.9–10.3)
Chloride: 104 mmol/L (ref 98–111)
Creatinine, Ser: 0.78 mg/dL (ref 0.44–1.00)
GFR, Estimated: >60 mL/min (ref >60)
Glucose, Bld: 108 mg/dL — ABNORMAL HIGH (ref 70–99)
Potassium: 3.8 mmol/L (ref 3.5–5.1)
Sodium: 136 mmol/L (ref 135–145)
Total Bilirubin: 0.4 mg/dL (ref 0.3–1.2)
Total Protein: 5.4 g/dL — ABNORMAL LOW (ref 6.5–8.1)

## 2020-03-03 LAB — URIC ACID: Uric Acid, Serum: 4.4 mg/dL (ref 2.5–7.1)

## 2020-03-03 MED ORDER — ADULT MULTIVITAMIN W/MINERALS CH
1.0000 | ORAL_TABLET | Freq: Every day | ORAL | Status: DC
  Administered 2020-03-03 – 2020-03-10 (×7): 1 via ORAL
  Filled 2020-03-03 (×8): qty 1

## 2020-03-03 MED ORDER — SENNOSIDES-DOCUSATE SODIUM 8.6-50 MG PO TABS
1.0000 | ORAL_TABLET | Freq: Two times a day (BID) | ORAL | Status: DC
  Administered 2020-03-03 – 2020-03-10 (×14): 1 via ORAL
  Filled 2020-03-03 (×15): qty 1

## 2020-03-03 MED ORDER — ENSURE ENLIVE PO LIQD
237.0000 mL | Freq: Two times a day (BID) | ORAL | Status: DC
  Administered 2020-03-04 – 2020-03-10 (×11): 237 mL via ORAL

## 2020-03-03 MED ORDER — CHLORHEXIDINE GLUCONATE CLOTH 2 % EX PADS
6.0000 | MEDICATED_PAD | Freq: Every day | CUTANEOUS | Status: DC
  Administered 2020-03-03 – 2020-03-10 (×8): 6 via TOPICAL

## 2020-03-03 MED ORDER — DICLOFENAC EPOLAMINE 1.3 % EX PTCH
1.0000 | MEDICATED_PATCH | Freq: Two times a day (BID) | CUTANEOUS | Status: DC
  Administered 2020-03-03 – 2020-03-10 (×15): 1 via TRANSDERMAL
  Filled 2020-03-03 (×17): qty 1

## 2020-03-03 MED ORDER — POLYETHYLENE GLYCOL 3350 17 G PO PACK
17.0000 g | PACK | Freq: Every day | ORAL | Status: DC
  Administered 2020-03-05 – 2020-03-10 (×6): 17 g via ORAL
  Filled 2020-03-03 (×7): qty 1

## 2020-03-03 NOTE — Care Management Important Message (Signed)
Important Message  Patient Details Verbal given by Patients daughter [name]. Name: Danna Sewell MRN: [id number] Date of Birth: Mar 23, 1921   Medicare Important Message Given:  Yes     Kerin Salen 03/03/2020, 2:29 PM

## 2020-03-03 NOTE — Plan of Care (Signed)
  Problem: Pain Managment: Goal: General experience of comfort will improve Outcome: Progressing   Problem: Safety: Goal: Ability to remain free from injury will improve Outcome: Progressing   

## 2020-03-03 NOTE — Progress Notes (Addendum)
    CC: Abdominal pain  Subjective: Denies abd pain. Reports she is tired and has knee pain. Tolerating liquids.   Objective: Vital signs in last 24 hours: Temp:  [97.8 F (36.6 C)-99.2 F (37.3 C)] 97.8 F (36.6 C) (01/28 0630) Pulse Rate:  [59-75] 66 (01/28 0630) Resp:  [15-31] 16 (01/28 0630) BP: (100-127)/(37-85) 108/48 (01/28 0630) SpO2:  [93 %-100 %] 100 % (01/28 0630) Last BM Date: 01/27/22No intake/output in the ED. No temperature recorded Respiratory rate 28-32 BP stable Sats 93 to 95% on room air CMP is stable WBC 10.7  Intake/Output from previous day: 01/27 0701 - 01/28 0700 In: 0  Out: 950 [Urine:950] Intake/Output this shift: Total I/O In: 120 [P.O.:120] Out: -   General appearance: alert, cooperative, no distress and Uncomfortable on the gurney in the ED. Resp: She has rales in both bases. GI: Abdomen soft she denies any tenderness on exam.  No BM or flatus reported.  Lab Results:  Recent Labs    03/02/20 0400 03/03/20 0543  WBC 10.7* 10.1  HGB 10.1* 10.0*  HCT 32.2* 30.8*  PLT 374 380    BMET Recent Labs    03/02/20 0400 03/03/20 0543  NA 135 136  K 4.1 3.8  CL 102 104  CO2 22 23  GLUCOSE 99 108*  BUN 10 7*  CREATININE 0.83 0.78  CALCIUM 8.3* 8.2*   PT/INR Recent Labs    03/01/20 0408  LABPROT 15.4*  INR 1.3*    Recent Labs  Lab 02/28/20 1543 03/01/20 0408 03/02/20 0400 03/03/20 0543  AST 28 20 34 21  ALT 26 17 22 17   ALKPHOS 80 58 51 46  BILITOT 0.5 0.5 0.6 0.4  PROT 7.3 6.1* 5.8* 5.4*  ALBUMIN 3.2* 2.8* 2.8* 2.4*     Lipase     Component Value Date/Time   LIPASE 17 02/28/2020 1543     Medications: . Chlorhexidine Gluconate Cloth  6 each Topical Daily  . glycopyrrolate  2 mg Oral q AM  . heparin  5,000 Units Subcutaneous Q8H  . insulin aspart  0-9 Units Subcutaneous TID WC  . ipratropium  0.5 mg Nebulization BID  . levothyroxine  75 mcg Oral Q0600  . pantoprazole  40 mg Oral Daily     Assessment/Plan Urinary retention  - foley in GERD Hypothyroidism Diabetes mellitus, type II COPD/pulmonary fibrosis  Acute diverticulitis with abscess - afebrile, WBC 10.1 - small 3 cm abscess, not amenable to IR drainage - continue IV abx, currentlyRocephin/flagyl.  - PRN analgesics and antiemetics   FEN: advance to SOFT diet.  VTE: SCD's, Lovenox Foley: placed in ED for retention   Plan: Advance to SOFT, continue antibiotics, start bowel reg Anticipate she will be stable for discharge from a [facility] perspective in 24-48 hours. WIll discuss possible repeat CT scan before discharge with MD given known abscess, but clinically she is improving.   LOS: 3 days    Shelia Black 03/03/2020 Please see Amion

## 2020-03-03 NOTE — Progress Notes (Addendum)
Initial Nutrition Assessment  INTERVENTION:   -Ensure Enlive po BID, each supplement provides 350 kcal and 20 grams of protein  -Multivitamin with minerals daily  NUTRITION DIAGNOSIS:   Increased nutrient needs related to acute illness as evidenced by estimated needs.  GOAL:   Patient will meet greater than or equal to 90% of their needs  MONITOR:   PO intake,Supplement acceptance,Labs,Weight trends,I & O's  REASON FOR ASSESSMENT:   Consult Assessment of nutrition requirement/status  ASSESSMENT:   87 year old white female history of degenerative disc disease, COPD, reflux, IPF on chronic steroids, diverticular disease CKD 3  Prior ischemic colitis and other multiple admissions for diverticulitis 03/12/2018 with bleeding. Admitted for likely diverticular abscess.  Patient currently consuming 0-20% of meals. Diet was advanced to soft for lunch.   Per surgery note, pt began having abdominal pain 3-4 days PTA. No longer c/o abdominal pain today. Diet was advanced. Will order protein supplements d/t abscess.  Admission weight: 135 lbs.  Medications: Miralax, Senokot  Labs reviewed: CBGs: 100-102  NUTRITION - FOCUSED PHYSICAL EXAM:  Unable to complete at this time, will attempt at follow-up.  Diet Order:   Diet Order            DIET SOFT Room service appropriate? Yes; Fluid consistency: Thin  Diet effective now                 EDUCATION NEEDS:   No education needs have been identified at this time  Skin:  Skin Assessment: Reviewed RN Assessment  Last BM:  1/27  Height:   Ht Readings from Last 1 Encounters:  02/28/20 5' 4"  (1.626 m)    Weight:   Wt Readings from Last 1 Encounters:  02/28/20 61.2 kg   BMI:  Body mass index is 23.17 kg/m.  Estimated Nutritional Needs:   Kcal:  1550-1750  Protein:  70-80g  Fluid:  1.5L/day  Clayton Bibles, MS, RD, LDN Inpatient Clinical Dietitian Contact information available via Amion

## 2020-03-03 NOTE — Progress Notes (Signed)
PROGRESS NOTE   Shelia Black  DUK:[id number] DOB: 1921/04/08 DOA: 02/28/2020 PCP: Shelia Stalker, PA-C  Brief Narrative:  87 year old white female history of degenerative disc disease, COPD, reflux, IPF on chronic steroids, diverticular disease CKD 3 Prior ischemic colitis and other multiple admissions for diverticulitis 03/12/2018 with bleeding seen by [facility] gastroenterology Hypothyroidism  Represented 02/29/2020 lower abdominal pain bleeding per rectum increasing fatigue and blood in stool Work-up revealed T-max 100.2 blood pressure soft 87/32 lactic acid 1.5 CT abdomen pelvis sigmoid diverticulitis +3.1 cm abscess Started on Cipro Flagyl with normal saline General surgery consulted  Assessment & Plan:   Active Problems:   Diverticulitis of colon with bleeding   Diabetes mellitus type 2 in obese (HCC)   Sepsis ([place])   ILD (interstitial lung disease) ([place])   Pulmonary fibrosis (HCC)   GERD (gastroesophageal reflux disease)   Sigmoid diverticulitis   Abscess   1. Likely diverticular abscess 3.1 cm a. Per IR abscess not amenable to percutaneous drainage b. Now on soft diet as of 1/28 but not hungry c. Continuing ceftriaxone and metronidazole at this time IV d. Continue saline 40 cc an hour 2. Severe arthritic pain R arm and R knee a. Will give diclo patch b. Will discuss starting colchicine with family c. Get Uric acid and review  3. COPD/IPF on chronic steroids baseline uses 2 L at home a. Continue oxygen b. Previously has been on steroids at baseline-continue Robinul 2 mg a.m. as well of Tussionex 5 mils every 12 cough, can continue albuterol as needed 4. Mild AKI superimposed on CKD 2 a. Monitor trends which are improving 5. Hypothyroidism a. Continue Synthroid 50 every morning 6. Degenerative disc disease 7. DM TY 2 a. At home is on Metformin 500 daily which has been held b. Monitor on sliding scale only 8. Reflux a. Continue Prilosec 40 daily  DVT  prophylaxis: Lovenox Code Status: Full CODE STATUS Family Communication:  Called daughter Shelia Black at [phone] but no answer on phone today Disposition:  Status is: Inpatient-patient can transition to MedSurg unit given stability and have updated the nursing staff  Remains inpatient appropriate because:IV treatments appropriate due to intensity of illness or inability to take PO   Dispo: The patient is from: Home              Anticipated d/c is to: Home              Anticipated d/c date is: 3 days              Patient currently is not medically stable to d/c.   Difficult to place patient No       Consultants:   General surgery  Interventional radiology  Procedures: None yet  Antimicrobials: Ciprofloxacin and Flagyl   Subjective: Severe UE and Knee pains No cp Not hungry but tol broth  Objective: Vitals:   03/02/20 1946 03/02/20 2107 03/03/20 0212 03/03/20 0630  BP:  (!) 127/57 (!) 116/47 (!) 108/48  Pulse:  75 60 66  Resp:  18 16 16   Temp:  99.1 F (37.3 C)  97.8 F (36.6 C)  TempSrc:  Oral    SpO2: 94% 100% 98% 100%  Weight:      Height:        Intake/Output Summary (Last 24 hours) at 03/03/2020 1208 Last data filed at 03/03/2020 1205 Gross per 24 hour  Intake 120 ml  Output 1250 ml  Net -1130 ml   Filed Weights   02/28/20 1544  Weight: 61.2 kg    Examination: Coherent very hoh EOMI NCAT no pallor no ict Neck soft supple J5-U7 holosystolic murmur heard best at left lower sternal edge Abdomen soft no rebound Foley catheter in place Power 5/5 to lower extremities   Data Reviewed: I have personally reviewed following labs and imaging studies  BUNs/creatinine 21/1.03-->7/0.7 Potassium 5.1-->4.1-->3.8 White count 11.2-->10.1    COVID-19 Labs  No results for input(s): DDIMER, FERRITIN, LDH, CRP in the last 72 hours.  Lab Results  Component Value Date   [place] NEGATIVE 02/28/2020   [place] NEGATIVE 10/05/2018   [place]  NEGATIVE 08/05/2018   SARSCOV2NAA NOT DETECTED 06/09/2018     Radiology Studies: No results found.   Scheduled Meds: . Chlorhexidine Gluconate Cloth  6 each Topical Daily  . diclofenac  1 patch Transdermal BID  . glycopyrrolate  2 mg Oral q AM  . heparin  5,000 Units Subcutaneous Q8H  . insulin aspart  0-9 Units Subcutaneous TID WC  . ipratropium  0.5 mg Nebulization BID  . levothyroxine  75 mcg Oral Q0600  . pantoprazole  40 mg Oral Daily  . polyethylene glycol  17 g Oral Daily  . senna-docusate  1 tablet Oral BID   Continuous Infusions: . sodium chloride 40 mL/hr at 03/02/20 1821  . cefTRIAXone (ROCEPHIN)  IV 2 g (03/03/20 0904)  . metronidazole 500 mg (03/03/20 0526)     LOS: 3 days    Time spent: 5  Shelia Sells, MD [facility] To contact the attending provider between 7A-7P or the covering provider during after hours 7P-7A, please log into the web site [email or web address] and access using universal [place] password for that web site. If you do not have the password, please call the hospital operator.  03/03/2020, 12:08 PM

## 2020-03-03 NOTE — Progress Notes (Signed)
PT Cancellation Note  Patient Details Name: Shelia Black MRN: [id number] DOB: July 30, 1921   Cancelled Treatment:    Reason Eval/Treat Not Completed: Fatigue/lethargy limiting ability to participate;Other (comment) (Pt recently medicated with pain meds. Daughter present and states she feels pt is too tired to participate. Pt also requests to rest. Will follow up at later date/time as schedule allows.)  Verner Mould, [place] Office [phone] Pager [phone]   Jacques Navy 03/03/2020, 2:52 PM

## 2020-03-04 ENCOUNTER — Inpatient Hospital Stay (HOSPITAL_COMMUNITY): Payer: Medicare Other

## 2020-03-04 DIAGNOSIS — K5792 Diverticulitis of intestine, part unspecified, without perforation or abscess without bleeding: Secondary | ICD-10-CM | POA: Diagnosis not present

## 2020-03-04 LAB — GLUCOSE, CAPILLARY
Glucose-Capillary: 147 mg/dL — ABNORMAL HIGH (ref 70–99)
Glucose-Capillary: 157 mg/dL — ABNORMAL HIGH (ref 70–99)
Glucose-Capillary: 114 mg/dL — ABNORMAL HIGH (ref 70–99)
Glucose-Capillary: 174 mg/dL — ABNORMAL HIGH (ref 70–99)

## 2020-03-04 LAB — CULTURE, BLOOD (ROUTINE X 2)
Culture: NO GROWTH
Culture: NO GROWTH
Special Requests: ADEQUATE
Special Requests: ADEQUATE

## 2020-03-04 LAB — CBC WITH DIFFERENTIAL/PLATELET
Abs Immature Granulocytes: 0.58 10*3/uL — ABNORMAL HIGH (ref 0.00–0.07)
Basophils Absolute: 0.1 10*3/uL (ref 0.0–0.1)
Basophils Relative: 1 %
Eosinophils Absolute: 0.1 10*3/uL (ref 0.0–0.5)
Eosinophils Relative: 1 %
HCT: 32.3 % — ABNORMAL LOW (ref 36.0–46.0)
Hemoglobin: 10.4 g/dL — ABNORMAL LOW (ref 12.0–15.0)
Immature Granulocytes: 5 %
Lymphocytes Relative: 13 %
Lymphs Abs: 1.6 10*3/uL (ref 0.7–4.0)
MCH: 33.7 pg (ref 26.0–34.0)
MCHC: 32.2 g/dL (ref 30.0–36.0)
MCV: 104.5 fL — ABNORMAL HIGH (ref 80.0–100.0)
Monocytes Absolute: 2 10*3/uL — ABNORMAL HIGH (ref 0.1–1.0)
Monocytes Relative: 16 %
Neutro Abs: 8 10*3/uL — ABNORMAL HIGH (ref 1.7–7.7)
Neutrophils Relative %: 64 %
Platelets: 414 10*3/uL — ABNORMAL HIGH (ref 150–400)
RBC: 3.09 MIL/uL — ABNORMAL LOW (ref 3.87–5.11)
RDW: 14.3 % (ref 11.5–15.5)
WBC: 12.3 10*3/uL — ABNORMAL HIGH (ref 4.0–10.5)
nRBC: 0 % (ref 0.0–0.2)

## 2020-03-04 LAB — COMPREHENSIVE METABOLIC PANEL
ALT: 15 U/L (ref 0–44)
AST: 17 U/L (ref 15–41)
Albumin: 2.5 g/dL — ABNORMAL LOW (ref 3.5–5.0)
Alkaline Phosphatase: 44 U/L (ref 38–126)
Anion gap: 11 (ref 5–15)
BUN: 8 mg/dL (ref 8–23)
CO2: 24 mmol/L (ref 22–32)
Calcium: 8.2 mg/dL — ABNORMAL LOW (ref 8.9–10.3)
Chloride: 101 mmol/L (ref 98–111)
Creatinine, Ser: 0.67 mg/dL (ref 0.44–1.00)
GFR, Estimated: >60 mL/min (ref >60)
Glucose, Bld: 152 mg/dL — ABNORMAL HIGH (ref 70–99)
Potassium: 3.4 mmol/L — ABNORMAL LOW (ref 3.5–5.1)
Sodium: 136 mmol/L (ref 135–145)
Total Bilirubin: 0.5 mg/dL (ref 0.3–1.2)
Total Protein: 5.7 g/dL — ABNORMAL LOW (ref 6.5–8.1)

## 2020-03-04 MED ORDER — TRAMADOL HCL 50 MG PO TABS: 50.0000 mg | ORAL_TABLET | Freq: Four times a day (QID) | ORAL | Status: DC | PRN

## 2020-03-04 MED ORDER — HYDROMORPHONE HCL 2 MG PO TABS
1.0000 mg | ORAL_TABLET | ORAL | Status: DC | PRN
  Administered 2020-03-05 – 2020-03-08 (×7): 1 mg via ORAL
  Filled 2020-03-04 (×8): qty 1

## 2020-03-04 MED ORDER — AMLODIPINE BESYLATE 10 MG PO TABS
10.0000 mg | ORAL_TABLET | Freq: Every day | ORAL | Status: DC
  Administered 2020-03-04 – 2020-03-07 (×4): 10 mg via ORAL
  Filled 2020-03-04 (×4): qty 1

## 2020-03-04 NOTE — Progress Notes (Signed)
PROGRESS NOTE   Shelia Black  LPF:[id number] DOB: [birth year] DOA: 02/28/2020 PCP: [name], PA-C  Brief Narrative:  87 year old white female history of degenerative disc disease, COPD, reflux, IPF on chronic steroids, diverticular disease CKD 3 Prior ischemic colitis and other multiple admissions for diverticulitis 03/12/2018 with bleeding seen by [facility] gastroenterology Hypothyroidism  Represented 02/29/2020 lower abdominal pain bleeding per rectum increasing fatigue and blood in stool Work-up revealed T-max 100.2 blood pressure soft 87/32 lactic acid 1.5 CT abdomen pelvis sigmoid diverticulitis +3.1 cm abscess Started on Cipro Flagyl with normal saline General surgery consulted but not recommending any surgery and have been monitoring  Assessment & Plan:   Active Problems:   Diverticulitis of colon with bleeding   Diabetes mellitus type 2 in obese (HCC)   Sepsis ([place])   ILD (interstitial lung disease) ([place])   Pulmonary fibrosis (HCC)   GERD (gastroesophageal reflux disease)   Sigmoid diverticulitis   Abscess   1. Likely diverticular abscess 3.1 cm a. Per IR abscess not amenable to percutaneous drainage b. Now on soft diet as of 1/28 but not hungry c. Continuing ceftriaxone and metronidazole at this time IV-White count slightly up today recheck in several days d. Continue saline 40 cc an hour 2. Severe arthritic pain R arm and R knee a. Pain pretty debilitating--not really responsive to diclo patch b. Add dilaudid low dose--stop fentanyl c. Uric acid argues against gout 3. COPD/IPF on chronic steroids baseline uses 2 L at home a. Continue oxygen b. Previously has been on steroids at baseline-continue Robinul 2 mg a.m. as well of Tussionex 5 mils every 12 cough, can continue albuterol as needed 4. Mild AKI superimposed on CKD 2 a. Repeat labs periodically 5. Hypothyroidism a. Continue Synthroid 50 every morning 6. Degenerative disc disease 7. DM TY 2 a. At  home is on Metformin 500 daily which has been held b. Monitor on sliding scale only 8. Reflux a. Continue Prilosec 40 daily  DVT prophylaxis: Lovenox Code Status: Full CODE STATUS Family Communication: Updated daughter [name] [phone] on 1/28 Disposition:  Status is: Inpatient-daughter wants to take her home[facility]  Remains inpatient appropriate because:IV treatments appropriate due to intensity of illness or inability to take PO   Dispo: The patient is from: Home              Anticipated d/c is to: Home              Anticipated d/c date is: 3 days              Patient currently is not medically stable to d/c.   Difficult to place patient No   Consultants:   General surgery  Interventional radiology  Procedures: None yet  Antimicrobials: Ciprofloxacin and Flagyl   Subjective: Pain unremitting in knees and hands No chest pain Not eating Has not mobilized since admission  Objective: Vitals:   03/03/20 0630 03/03/20 1333 03/03/20 2139 03/04/20 0526  BP: (!) 108/48 (!) 121/56 (!) 111/54 (!) 117/57  Pulse: 66 69 71 75  Resp: 16 17 (!) 24 (!) 24  Temp: 97.8 F (36.6 C) 99.5 F (37.5 C) 100 F (37.8 C) 99.8 F (37.7 C)  TempSrc:  Oral Oral Oral  SpO2: 100% 100% 96% 94%  Weight:      Height:        Intake/Output Summary (Last 24 hours) at 03/04/2020 1200 Last data filed at 03/04/2020 1000 Gross per 24 hour  Intake 1211.63 ml  Output 1325  ml  Net -113.37 ml   Filed Weights   02/28/20 1544  Weight: 61.2 kg    Examination:  Hard of hearing no distress EOMI NCAT On oxygen Chest clear no added sound no rales no rhonchi Abdomen soft no tenderness Right hand still slightly swollen but seems improved compared to prior in terms of swelling although quite painful to passive movement Neurologically intact moves 4 limbs   Data Reviewed: I have personally reviewed following labs and imaging studies  BUNs/creatinine  21/1.03-->7/0.7-->8/0.6 Potassium 5.1-->4.1-->3.8-->3.4 White count 11.2-->10.1-->12.3    COVID-19 Labs  No results for input(s): DDIMER, FERRITIN, LDH, CRP in the last 72 hours.  Lab Results  Component Value Date   [place] NEGATIVE 02/28/2020   [place] NEGATIVE 10/05/2018   [place] NEGATIVE 08/05/2018   SARSCOV2NAA NOT DETECTED 06/09/2018     Radiology Studies: No results found.   Scheduled Meds: . Chlorhexidine Gluconate Cloth  6 each Topical Daily  . diclofenac  1 patch Transdermal BID  . feeding supplement  237 mL Oral BID BM  . glycopyrrolate  2 mg Oral q AM  . heparin  5,000 Units Subcutaneous Q8H  . insulin aspart  0-9 Units Subcutaneous TID WC  . levothyroxine  75 mcg Oral Q0600  . multivitamin with minerals  1 tablet Oral Daily  . pantoprazole  40 mg Oral Daily  . polyethylene glycol  17 g Oral Daily  . senna-docusate  1 tablet Oral BID   Continuous Infusions: . sodium chloride 40 mL/hr at 03/04/20 0302  . cefTRIAXone (ROCEPHIN)  IV 2 g (03/04/20 1047)  . metronidazole 500 mg (03/04/20 0606)     LOS: 4 days    Time spent: 11  Nita Sells, MD [facility] To contact the attending provider between 7A-7P or the covering provider during after hours 7P-7A, please log into the web site [email or web address] and access using universal [place] password for that web site. If you do not have the password, please call the hospital operator.  03/04/2020, 12:00 PM

## 2020-03-04 NOTE — Evaluation (Signed)
Physical Therapy Evaluation Patient Details Name: Shelia Black MRN: [id number] DOB: 08/01/1921 Today's Date: 03/04/2020   History of Present Illness  87 yo female admitted with diverticulitis, R UE/R LE pain. Hx of hearing loss (hoh even with hearing aids), DDD, COPD, pulm fibrosis-O2 dep, spinal stenosis, DM, chronic pain.  Clinical Impression  Bed level eval only. Pt was very lethargic and had difficulty keeping her eyes open. She grimaced and moaned (at times cried out with pain). She was able to participate a little with bed exercises. No family present during session. Will continue to follow and progress activity as tolerated.     Follow Up Recommendations SNF    Equipment Recommendations  None recommended by PT    Recommendations for Other Services       Precautions / Restrictions Precautions Precautions: Fall Precaution Comments: significant pain; extrememtly HOH! Restrictions Weight Bearing Restrictions: No      Mobility  Bed Mobility               General bed mobility comments: NT-pt too lethargic, too painful with movement. She could not keep her eyes open    Transfers                    Ambulation/Gait                Stairs            Wheelchair Mobility    Modified Rankin (Stroke Patients Only)       Balance                                             Pertinent Vitals/Pain Pain Assessment: Faces Faces Pain Scale: Hurts whole lot Pain Location: R UE, R LE, lower abdomen spasms Pain Descriptors / Indicators: Spasm;Sharp;Aching;Grimacing;Moaning Pain Intervention(s): Limited activity within patient's tolerance;Repositioned    Home Living Family/patient expects to be discharged to:: Unsure Living Arrangements: Children                    Prior Function           Comments: no family present during eval. Unable to get PLOF info from pt on eval     Hand Dominance         Extremity/Trunk Assessment   Upper Extremity Assessment Upper Extremity Assessment: Defer to OT evaluation    Lower Extremity Assessment Lower Extremity Assessment: Generalized weakness       Communication   Communication: HOH  Cognition Arousal/Alertness: Lethargic Behavior During Therapy: WFL for tasks assessed/performed Overall Cognitive Status: Difficult to assess                                        General Comments      Exercises General Exercises - Upper Extremity Shoulder Flexion: AAROM;AROM;Both;5 reps Elbow Flexion: AROM;AAROM;Both;5 reps General Exercises - Lower Extremity Ankle Circles/Pumps: AAROM;AROM;Both;5 reps Heel Slides: AAROM;Left;5 reps (unable to tolerate on the R)   Assessment/Plan    PT Assessment Patient needs continued PT services  PT Problem List Decreased strength;Decreased mobility;Decreased balance;Decreased activity tolerance;Decreased knowledge of use of DME;Pain;Decreased range of motion       PT Treatment Interventions DME instruction;Gait training;Therapeutic activities;Therapeutic exercise;Patient/family education;Balance training;Functional mobility training    PT Goals (Current goals  can be found in the Care Plan section)  Acute Rehab PT Goals Patient Stated Goal: none stated PT Goal Formulation: Patient unable to participate in goal setting Time For Goal Achievement: 03/18/20 Potential to Achieve Goals: Fair    Frequency Min 3X/week   Barriers to discharge        Co-evaluation               AM-PAC PT "6 Clicks" Mobility  Outcome Measure Help needed turning from your back to your side while in a flat bed without using bedrails?: Total Help needed moving from lying on your back to sitting on the side of a flat bed without using bedrails?: Total Help needed moving to and from a bed to a chair (including a wheelchair)?: Total Help needed standing up from a chair using your arms (e.g., wheelchair or  bedside chair)?: Total Help needed to walk in hospital room?: Total Help needed climbing 3-5 steps with a railing? : Total 6 Click Score: 6    End of Session Equipment Utilized During Treatment: Oxygen Activity Tolerance: Patient limited by pain;Patient limited by lethargy Patient left: in bed;with call bell/phone within reach   PT Visit Diagnosis: Pain;Muscle weakness (generalized) (M62.81) Pain - Right/Left: Right Pain - part of body: Arm;Leg    Time: 7342-8768 PT Time Calculation (min) (ACUTE ONLY): 18 min   Charges:   PT Evaluation $PT Eval Moderate Complexity: 1 Mod             Doreatha Massed, PT Acute Rehabilitation  Office: [phone] Pager: [phone]

## 2020-03-04 NOTE — Progress Notes (Signed)
    CC: Abdominal pain  Subjective: Denies abd pain.Tolerating some liquids.   Objective: Vital signs in last 24 hours: Temp:  [99.5 F (37.5 C)-100 F (37.8 C)] 99.8 F (37.7 C) (01/29 0526) Pulse Rate:  [69-75] 75 (01/29 0526) Resp:  [17-24] 24 (01/29 0526) BP: (111-121)/(54-57) 117/57 (01/29 0526) SpO2:  [94 %-100 %] 94 % (01/29 0526) Last BM Date: 01/27/22No intake/output in the ED.   Intake/Output from previous day: 01/28 0701 - 01/29 0700 In: 3213.1 [P.O.:560; I.V.:2053.5; IV Piggyback:599.7] Out: 1025 [Urine:1025] Intake/Output this shift: No intake/output data recorded.  General appearance: alert, cooperative, no distress   GI: Abdomen soft she denies any tenderness on exam.  No BM or flatus reported.  Lab Results:  Recent Labs    03/03/20 0543 03/04/20 0629  WBC 10.1 12.3*  HGB 10.0* 10.4*  HCT 30.8* 32.3*  PLT 380 414*    BMET Recent Labs    03/03/20 0543 03/04/20 0629  NA 136 136  K 3.8 3.4*  CL 104 101  CO2 23 24  GLUCOSE 108* 152*  BUN 7* 8  CREATININE 0.78 0.67  CALCIUM 8.2* 8.2*   PT/INR No results for input(s): LABPROT, INR in the last 72 hours.  Recent Labs  Lab 02/28/20 1543 03/01/20 0408 03/02/20 0400 03/03/20 0543 03/04/20 0629  AST 28 20 34 21 17  ALT 26 17 22 17 15   ALKPHOS 80 58 51 46 44  BILITOT 0.5 0.5 0.6 0.4 0.5  PROT 7.3 6.1* 5.8* 5.4* 5.7*  ALBUMIN 3.2* 2.8* 2.8* 2.4* 2.5*     Lipase     Component Value Date/Time   LIPASE 17 02/28/2020 1543     Medications: . Chlorhexidine Gluconate Cloth  6 each Topical Daily  . diclofenac  1 patch Transdermal BID  . feeding supplement  237 mL Oral BID BM  . glycopyrrolate  2 mg Oral q AM  . heparin  5,000 Units Subcutaneous Q8H  . insulin aspart  0-9 Units Subcutaneous TID WC  . levothyroxine  75 mcg Oral Q0600  . multivitamin with minerals  1 tablet Oral Daily  . pantoprazole  40 mg Oral Daily  . polyethylene glycol  17 g Oral Daily  . senna-docusate  1 tablet  Oral BID    Assessment/Plan Urinary retention  - foley in GERD Hypothyroidism Diabetes mellitus, type II COPD/pulmonary fibrosis  Acute diverticulitis with abscess - afebrile, WBC trending up  - small 3 cm abscess, not amenable to IR drainage - continue IV abx, currentlyRocephin/flagyl.  - PRN analgesics and antiemetics   FEN: advance to SOFT diet.  VTE: SCD's, Lovenox Foley: placed in ED for retention   Plan: Advance to SOFT, continue antibiotics, start bowel reg Repeat CBC in AM. May need to repeat CT scan before discharge   LOS: 4 days    Rosario Adie 4/56/2563 Please see Amion

## 2020-03-05 DIAGNOSIS — K5792 Diverticulitis of intestine, part unspecified, without perforation or abscess without bleeding: Secondary | ICD-10-CM | POA: Diagnosis not present

## 2020-03-05 LAB — CBC
HCT: 32.1 % — ABNORMAL LOW (ref 36.0–46.0)
Hemoglobin: 10.7 g/dL — ABNORMAL LOW (ref 12.0–15.0)
MCH: 33.6 pg (ref 26.0–34.0)
MCHC: 33.3 g/dL (ref 30.0–36.0)
MCV: 100.9 fL — ABNORMAL HIGH (ref 80.0–100.0)
Platelets: 510 10*3/uL — ABNORMAL HIGH (ref 150–400)
RBC: 3.18 MIL/uL — ABNORMAL LOW (ref 3.87–5.11)
RDW: 14.2 % (ref 11.5–15.5)
WBC: 13 10*3/uL — ABNORMAL HIGH (ref 4.0–10.5)
nRBC: 0 % (ref 0.0–0.2)

## 2020-03-05 LAB — GLUCOSE, CAPILLARY
Glucose-Capillary: 124 mg/dL — ABNORMAL HIGH (ref 70–99)
Glucose-Capillary: 116 mg/dL — ABNORMAL HIGH (ref 70–99)
Glucose-Capillary: 149 mg/dL — ABNORMAL HIGH (ref 70–99)
Glucose-Capillary: 116 mg/dL — ABNORMAL HIGH (ref 70–99)

## 2020-03-05 LAB — BASIC METABOLIC PANEL
Anion gap: 11 (ref 5–15)
BUN: 8 mg/dL (ref 8–23)
CO2: 25 mmol/L (ref 22–32)
Calcium: 8.2 mg/dL — ABNORMAL LOW (ref 8.9–10.3)
Chloride: 99 mmol/L (ref 98–111)
Creatinine, Ser: 0.63 mg/dL (ref 0.44–1.00)
GFR, Estimated: >60 mL/min (ref >60)
Glucose, Bld: 126 mg/dL — ABNORMAL HIGH (ref 70–99)
Potassium: 3.2 mmol/L — ABNORMAL LOW (ref 3.5–5.1)
Sodium: 135 mmol/L (ref 135–145)

## 2020-03-05 MED ORDER — PREDNISONE 20 MG PO TABS
20.0000 mg | ORAL_TABLET | Freq: Every day | ORAL | Status: AC
  Administered 2020-03-06 – 2020-03-10 (×5): 20 mg via ORAL
  Filled 2020-03-05 (×5): qty 1

## 2020-03-05 MED ORDER — POTASSIUM CHLORIDE CRYS ER 20 MEQ PO TBCR
40.0000 meq | EXTENDED_RELEASE_TABLET | Freq: Every day | ORAL | Status: DC
  Administered 2020-03-05 – 2020-03-10 (×6): 40 meq via ORAL
  Filled 2020-03-05 (×6): qty 2

## 2020-03-05 NOTE — Progress Notes (Signed)
    CC: Abdominal pain  Subjective: Denies abd pain.Tolerating some liquids.   Objective: Vital signs in last 24 hours: Temp:  [97.8 F (36.6 C)-98.9 F (37.2 C)] 98.9 F (37.2 C) (01/30 0507) Pulse Rate:  [66-77] 75 (01/30 0507) Resp:  [16-18] 18 (01/30 0507) BP: (114-187)/(56-82) 126/58 (01/30 0507) SpO2:  [97 %-100 %] 97 % (01/30 0507) Last BM Date: 03/02/20  Intake/Output from previous day: 01/29 0701 - 01/30 0700 In: 2501.6 [P.O.:600; I.V.:980.8; IV Piggyback:920.8] Out: 2550 [Urine:2550] Intake/Output this shift: No intake/output data recorded.  General appearance: sleepy, cooperative, no distress   GI: Abdomen soft, TTP SP.  No BM or flatus reported.  Lab Results:  Recent Labs    03/04/20 0629 03/05/20 0555  WBC 12.3* 13.0*  HGB 10.4* 10.7*  HCT 32.3* 32.1*  PLT 414* 510*    BMET Recent Labs    03/04/20 0629 03/05/20 0555  NA 136 135  K 3.4* 3.2*  CL 101 99  CO2 24 25  GLUCOSE 152* 126*  BUN 8 8  CREATININE 0.67 0.63  CALCIUM 8.2* 8.2*   PT/INR No results for input(s): LABPROT, INR in the last 72 hours.  Recent Labs  Lab 02/28/20 1543 03/01/20 0408 03/02/20 0400 03/03/20 0543 03/04/20 0629  AST 28 20 34 21 17  ALT 26 17 22 17 15   ALKPHOS 80 58 51 46 44  BILITOT 0.5 0.5 0.6 0.4 0.5  PROT 7.3 6.1* 5.8* 5.4* 5.7*  ALBUMIN 3.2* 2.8* 2.8* 2.4* 2.5*     Lipase     Component Value Date/Time   LIPASE 17 02/28/2020 1543     Medications: . amLODipine  10 mg Oral Daily  . Chlorhexidine Gluconate Cloth  6 each Topical Daily  . diclofenac  1 patch Transdermal BID  . feeding supplement  237 mL Oral BID BM  . glycopyrrolate  2 mg Oral q AM  . heparin  5,000 Units Subcutaneous Q8H  . insulin aspart  0-9 Units Subcutaneous TID WC  . levothyroxine  75 mcg Oral Q0600  . multivitamin with minerals  1 tablet Oral Daily  . pantoprazole  40 mg Oral Daily  . polyethylene glycol  17 g Oral Daily  . potassium chloride  40 mEq Oral Daily  .  senna-docusate  1 tablet Oral BID    Assessment/Plan Urinary retention  - foley in GERD Hypothyroidism Diabetes mellitus, type II COPD/pulmonary fibrosis  Acute diverticulitis with abscess - afebrile, WBC trending up  - small 3 cm abscess, not amenable to IR drainage - continue IV abx, currentlyRocephin/flagyl.  - PRN analgesics and antiemetics   FEN: advance to SOFT diet.  VTE: SCD's, Lovenox Foley: placed in ED for retention   Plan: Cont soft diet as tolerated, continue antibiotics, cont bowel reg Repeat CBC in AM. Would rec repeat CT tom if wbc still trending up   LOS: 5 days    Rosario Adie 06/06/7739 Please see Amion

## 2020-03-05 NOTE — Progress Notes (Signed)
PROGRESS NOTE   Shelia Black  XHB:[id number] DOB: 1921/10/23 DOA: 02/28/2020 PCP: [name], PA-C  Brief Narrative:  87 year old white female history of degenerative disc disease, COPD, reflux, IPF on chronic steroids, diverticular disease CKD 3 Prior ischemic colitis and other multiple admissions for diverticulitis 03/12/2018 with bleeding seen by [facility] gastroenterology Hypothyroidism  Represented 02/29/2020 lower abdominal pain bleeding per rectum increasing fatigue and blood in stool Work-up revealed T-max 100.2 blood pressure soft 87/32 lactic acid 1.5 CT abdomen pelvis sigmoid diverticulitis +3.1 cm abscess Started on Cipro Flagyl with normal saline General surgery consulted but not recommending any surgery and have been monitoring  Assessment & Plan:   Active Problems:   Diverticulitis of colon with bleeding   Diabetes mellitus type 2 in obese (HCC)   Sepsis ([place])   ILD (interstitial lung disease) ([place])   Pulmonary fibrosis (HCC)   GERD (gastroesophageal reflux disease)   Sigmoid diverticulitis   Abscess   1. Likely diverticular abscess 3.1 cm a. Per IR abscess not amenable to percutaneous drainage b. Now on soft diet as of 1/28 but not hungry c. Imaging as per general surgery continue ceftriaxone Flagyl d. Continue saline 40 cc an hour 2. Severe arthritic pain R arm and R knee a. Pain pretty debilitating--not really responsive to diclo patch b. Add dilaudid low dose--stop fentanyl c. Uric acid argues against gout 3. COPD/IPF on chronic steroids baseline uses 2 L at home a. Continue oxygen b. Previously has been on steroids at baseline-continue Robinul 2 mg a.m. as well of Tussionex 5 mils every 12 cough, can continue albuterol as needed 4. Mild AKI superimposed on CKD 2 a. Repeat labs periodically 5. Hypokalemia a. Replace with K. Dur 40 6. Hypothyroidism a. Continue Synthroid 50 every morning 7. Degenerative disc disease 8. DM TY 2 a. At home is  on Metformin 500 daily which has been held b. Monitor on sliding scale only 9. Reflux a. Continue Prilosec 40 daily  DVT prophylaxis: Lovenox Code Status: Full CODE STATUS Family Communication: No family present today Disposition:  Status is: Inpatient-daughter wants to take her home--although daughter wishes to take her home she has been recommended for skilled  Remains inpatient appropriate because:IV treatments appropriate due to intensity of illness or inability to take PO   Dispo: The patient is from: Home              Anticipated d/c is to: SNF              Anticipated d/c date is: 3 days              Patient currently is not medically stable to d/c.   Difficult to place patient No   Consultants:   General surgery  Interventional radiology  Procedures: None yet  Antimicrobials: Ceftriaxone and Flagyl   Subjective:  Pain seems less She is coughing continuously No chest pain Mild headache No nausea no vomiting She is not eating  Objective: Vitals:   03/04/20 1709 03/04/20 2120 03/05/20 0133 03/05/20 0507  BP: 114/61 (!) 139/56 119/60 (!) 126/58  Pulse: 74 77 66 75  Resp:  18 18 18   Temp:  98.9 F (37.2 C) 97.8 F (36.6 C) 98.9 F (37.2 C)  TempSrc:  Oral Oral Oral  SpO2:  98% 100% 97%  Weight:      Height:        Intake/Output Summary (Last 24 hours) at 03/05/2020 1022 Last data filed at 03/05/2020 0700 Gross per 24 hour  Intake 2441.57 ml  Output 2250 ml  Net 191.57 ml   Filed Weights   02/28/20 1544  Weight: 61.2 kg    Examination:  EOMI NCAT no focal deficit Mild crackles in the left posterior lateral lung fields No rales no rhonchi S1-S2 no murmur Abdomen soft no rebound no guarding Right hand is swollen but less so than prior it is slightly red Right knee is slightly swollen but very tender to passive motion   Data Reviewed: I have personally reviewed following labs and imaging studies  BUNs/creatinine  21/1.03-->7/0.7-->8/0.6-->8/0.6 Potassium 5.1-->4.1-->3.8-->3.4-->3.2 White count 11.2-->10.1-->12.3-->13    COVID-19 Labs  No results for input(s): DDIMER, FERRITIN, LDH, CRP in the last 72 hours.  Lab Results  Component Value Date   [place] NEGATIVE 02/28/2020   [place] NEGATIVE 10/05/2018   [place] NEGATIVE 08/05/2018   [place] NOT DETECTED 06/09/2018     Radiology Studies: DG Knee 1-2 Views Right  Result Date: 03/04/2020 CLINICAL DATA:  RIGHT knee pain post fall EXAM: RIGHT KNEE - 1-2 VIEW COMPARISON:  Portable exam 1405 hours without priors for comparison FINDINGS: Osseous demineralization. Components of a RIGHT knee prosthesis are identified. No acute fracture, dislocation, bone destruction or periprosthetic lucency. Small knee joint effusion. IMPRESSION: RIGHT knee prosthesis with small joint effusion. No acute osseous abnormalities. Electronically Signed   By: Lavonia Dana M.D.   On: 03/04/2020 14:17     Scheduled Meds: . amLODipine  10 mg Oral Daily  . Chlorhexidine Gluconate Cloth  6 each Topical Daily  . diclofenac  1 patch Transdermal BID  . feeding supplement  237 mL Oral BID BM  . glycopyrrolate  2 mg Oral q AM  . heparin  5,000 Units Subcutaneous Q8H  . insulin aspart  0-9 Units Subcutaneous TID WC  . levothyroxine  75 mcg Oral Q0600  . multivitamin with minerals  1 tablet Oral Daily  . pantoprazole  40 mg Oral Daily  . polyethylene glycol  17 g Oral Daily  . potassium chloride  40 mEq Oral Daily  . senna-docusate  1 tablet Oral BID   Continuous Infusions: . sodium chloride 40 mL/hr at 03/04/20 0302  . cefTRIAXone (ROCEPHIN)  IV 2 g (03/04/20 1047)  . metronidazole 500 mg (03/05/20 0628)     LOS: 5 days    Time spent: 16  Nita Sells, MD [facility] To contact the attending provider between 7A-7P or the covering provider during after hours 7P-7A, please log into the web site [email or web address] and access using universal  [place] password for that web site. If you do not have the password, please call the hospital operator.  03/05/2020, 10:22 AM

## 2020-03-06 ENCOUNTER — Inpatient Hospital Stay (HOSPITAL_COMMUNITY): Payer: Medicare Other

## 2020-03-06 DIAGNOSIS — K5792 Diverticulitis of intestine, part unspecified, without perforation or abscess without bleeding: Secondary | ICD-10-CM | POA: Diagnosis not present

## 2020-03-06 LAB — COMPREHENSIVE METABOLIC PANEL
ALT: 11 U/L (ref 0–44)
AST: 13 U/L — ABNORMAL LOW (ref 15–41)
Albumin: 2.3 g/dL — ABNORMAL LOW (ref 3.5–5.0)
Alkaline Phosphatase: 42 U/L (ref 38–126)
Anion gap: 9 (ref 5–15)
BUN: 11 mg/dL (ref 8–23)
CO2: 25 mmol/L (ref 22–32)
Calcium: 8.2 mg/dL — ABNORMAL LOW (ref 8.9–10.3)
Chloride: 103 mmol/L (ref 98–111)
Creatinine, Ser: 0.58 mg/dL (ref 0.44–1.00)
GFR, Estimated: >60 mL/min (ref >60)
Glucose, Bld: 127 mg/dL — ABNORMAL HIGH (ref 70–99)
Potassium: 3.4 mmol/L — ABNORMAL LOW (ref 3.5–5.1)
Sodium: 137 mmol/L (ref 135–145)
Total Bilirubin: 0.4 mg/dL (ref 0.3–1.2)
Total Protein: 5.3 g/dL — ABNORMAL LOW (ref 6.5–8.1)

## 2020-03-06 LAB — CBC WITH DIFFERENTIAL/PLATELET
Abs Immature Granulocytes: 0.49 10*3/uL — ABNORMAL HIGH (ref 0.00–0.07)
Basophils Absolute: 0.1 10*3/uL (ref 0.0–0.1)
Basophils Relative: 1 %
Eosinophils Absolute: 0.1 10*3/uL (ref 0.0–0.5)
Eosinophils Relative: 1 %
HCT: 31 % — ABNORMAL LOW (ref 36.0–46.0)
Hemoglobin: 10 g/dL — ABNORMAL LOW (ref 12.0–15.0)
Immature Granulocytes: 4 %
Lymphocytes Relative: 16 %
Lymphs Abs: 1.9 10*3/uL (ref 0.7–4.0)
MCH: 33.1 pg (ref 26.0–34.0)
MCHC: 32.3 g/dL (ref 30.0–36.0)
MCV: 102.6 fL — ABNORMAL HIGH (ref 80.0–100.0)
Monocytes Absolute: 1.7 10*3/uL — ABNORMAL HIGH (ref 0.1–1.0)
Monocytes Relative: 15 %
Neutro Abs: 7.4 10*3/uL (ref 1.7–7.7)
Neutrophils Relative %: 63 %
Platelets: 444 10*3/uL — ABNORMAL HIGH (ref 150–400)
RBC: 3.02 MIL/uL — ABNORMAL LOW (ref 3.87–5.11)
RDW: 14.4 % (ref 11.5–15.5)
WBC: 11.7 10*3/uL — ABNORMAL HIGH (ref 4.0–10.5)
nRBC: 0 % (ref 0.0–0.2)

## 2020-03-06 LAB — GLUCOSE, CAPILLARY
Glucose-Capillary: 101 mg/dL — ABNORMAL HIGH (ref 70–99)
Glucose-Capillary: 221 mg/dL — ABNORMAL HIGH (ref 70–99)
Glucose-Capillary: 244 mg/dL — ABNORMAL HIGH (ref 70–99)

## 2020-03-06 NOTE — Progress Notes (Signed)
PROGRESS NOTE   Shelia Black  QMV:[id number] DOB: 02-17-1921 DOA: 02/28/2020 PCP: Shelia Stalker, PA-C  Brief Narrative:  87 year old white female history of degenerative disc disease, COPD, reflux, IPF on chronic steroids, diverticular disease CKD 3 Prior ischemic colitis and other multiple admissions for diverticulitis 03/12/2018 with bleeding seen by [facility] gastroenterology Hypothyroidism  Represented 02/29/2020 lower abdominal pain bleeding per rectum increasing fatigue and blood in stool Work-up revealed T-max 100.2 blood pressure soft 87/32 lactic acid 1.5 CT abdomen pelvis sigmoid diverticulitis +3.1 cm abscess Started on Cipro Flagyl with normal saline General surgery consulted but not recommending any surgery and have been monitoring  Assessment & Plan:   Active Problems:   Diverticulitis of colon with bleeding   Diabetes mellitus type 2 in obese (HCC)   Sepsis ([place])   ILD (interstitial lung disease) ([place])   Pulmonary fibrosis (HCC)   GERD (gastroesophageal reflux disease)   Sigmoid diverticulitis   Abscess   1. Likely diverticular abscess 3.1 cm a. Per IR abscess not amenable to percutaneous drainage b. Now on soft diet as of 1/28 but not hungry c. Imaging as per general surgery continue ceftriaxone Flagyl--wbc is down d. Continue saline 40 cc an hour 2. ?Aspiration a. CXR from 1/31 shows increased opacities b. Get SLP to see--current ABx should cover respiratory pathogen c. Downgrade to dys 1 3. Severe arthritic pain R arm and R knee a. Pain pretty debilitating--not really responsive to diclo patch b. Add dilaudid low dose--stop fentanyl c. Uric acid argues against gout d. Started low dose prednisone 20 mg e. Will cc Dr. Amedeo Plenty but no emergent need for ortho eval f. Will get DS DNA and RF to r/o arthritide 4. COPD/IPF on chronic steroids baseline uses 2 L at home a. Continue oxygen b. Previously has been on steroids at baseline-continue Robinul 2 mg  a.m. as well of Tussionex 5 mils every 12 cough, can continue albuterol as needed 5. Mild AKI superimposed on CKD 2 a. Repeat labs periodically 6. Hypokalemia a. Replace with K. Dur 40 b. Rpt labs in am 7. Hypothyroidism a. Continue Synthroid 50 every morning 8. Degenerative disc disease 9. DM TY 2 a. At home is on Metformin 500 daily which has been held b. Monitor on sliding scale only--sugars 100-130 10. Reflux a. Continue Prilosec 40 daily  DVT prophylaxis: Lovenox Code Status: Full CODE STATUS Family Communication: long discussion with daughter Shelia Black [phone] at the bedside Disposition:  Status is: Inpatient-daughter wants to take her home--although daughter wishes to take her home she has been recommended for skilled  Remains inpatient appropriate because:IV treatments appropriate due to intensity of illness or inability to take PO   Dispo: The patient is from: Home              Anticipated d/c is to: SNF              Anticipated d/c date is: 3 days              Patient currently is not medically stable to d/c.   Difficult to place patient No   Consultants:   General surgery  Interventional radiology  Procedures: None yet  Antimicrobials: Ceftriaxone and Flagyl   Subjective:  Pain is all over now in neck and back No fever not eating much No cp soft stool yesterday 1/30  Objective: Vitals:   03/05/20 0507 03/05/20 1427 03/05/20 2152 03/06/20 0547  BP: (!) 126/58 (!) 107/46 (!) 110/49 (!) 113/47  Pulse: 75 76 61  63  Resp: 18 17 16 14   Temp: 98.9 F (37.2 C) 99.8 F (37.7 C) 98.3 F (36.8 C) 98 F (36.7 C)  TempSrc: Oral Oral Oral Oral  SpO2: 97% 97% 99% 97%  Weight:      Height:        Intake/Output Summary (Last 24 hours) at 03/06/2020 0841 Last data filed at 03/06/2020 0545 Gross per 24 hour  Intake 1125.78 ml  Output 900 ml  Net 225.78 ml   Filed Weights   02/28/20 1544  Weight: 61.2 kg    Examination:  EOMI NCAT no focal deficit  quite sleepy but rouses Decreased lung sounds S1-S2 no murmur Abdomen soft no rebound no guarding Right hand is swollen but less so than prior it is slightly red Right knee is slightly swollen but very tender to passive motion   Data Reviewed: I have personally reviewed following labs and imaging studies  BUNs/creatinine 21/1.03-->7/0.7-->8/0.6-->8/0.6-->11/0.5 Potassium 5.1-->4.1-->3.8-->3.4-->3.2-->3.4 White count 11.2-->10.1-->12.3-->13-->11.7    COVID-19 Labs  No results for input(s): DDIMER, FERRITIN, LDH, CRP in the last 72 hours.  Lab Results  Component Value Date   [place] NEGATIVE 02/28/2020   [place] NEGATIVE 10/05/2018   [place] NEGATIVE 08/05/2018   [place] NOT DETECTED 06/09/2018     Radiology Studies: DG Chest 1 View  Result Date: 03/06/2020 CLINICAL DATA:  Pneumonia EXAM: CHEST  1 VIEW COMPARISON:  Chest x-ray 02/28/2020 FINDINGS: The heart size and mediastinal contours are unchanged. Aortic arch calcification. Interval increase in retrocardiac opacity. Likely trace bilateral pleural effusion. Redemonstration of bilateral lower lung zone patchy airspace opacity. Redemonstration of coarsened and increased interstitial markings. Biapical pleural/pulmonary scarring. No pneumothorax. No acute osseous abnormality. IMPRESSION: 1. Multifocal pneumonia with worsened retrocardiac opacity. 2. Likely bilateral trace pleural effusions. Electronically Signed   By: Iven Finn M.D.   On: 03/06/2020 06:00   DG Knee 1-2 Views Right  Result Date: 03/04/2020 CLINICAL DATA:  RIGHT knee pain post fall EXAM: RIGHT KNEE - 1-2 VIEW COMPARISON:  Portable exam 1405 hours without priors for comparison FINDINGS: Osseous demineralization. Components of a RIGHT knee prosthesis are identified. No acute fracture, dislocation, bone destruction or periprosthetic lucency. Small knee joint effusion. IMPRESSION: RIGHT knee prosthesis with small joint effusion. No acute osseous  abnormalities. Electronically Signed   By: Lavonia Dana M.D.   On: 03/04/2020 14:17     Scheduled Meds: . amLODipine  10 mg Oral Daily  . Chlorhexidine Gluconate Cloth  6 each Topical Daily  . diclofenac  1 patch Transdermal BID  . feeding supplement  237 mL Oral BID BM  . glycopyrrolate  2 mg Oral q AM  . heparin  5,000 Units Subcutaneous Q8H  . insulin aspart  0-9 Units Subcutaneous TID WC  . levothyroxine  75 mcg Oral Q0600  . multivitamin with minerals  1 tablet Oral Daily  . pantoprazole  40 mg Oral Daily  . polyethylene glycol  17 g Oral Daily  . potassium chloride  40 mEq Oral Daily  . predniSONE  20 mg Oral QAC breakfast  . senna-docusate  1 tablet Oral BID   Continuous Infusions: . sodium chloride 40 mL/hr at 03/06/20 0200  . cefTRIAXone (ROCEPHIN)  IV Stopped (03/05/20 1143)  . metronidazole 500 mg (03/06/20 0545)     LOS: 6 days    Time spent: 61  Nita Sells, MD [facility] To contact the attending provider between 7A-7P or the covering provider during after hours 7P-7A, please log into the web site [email or web address]  and access using universal [place] password for that web site. If you do not have the password, please call the hospital operator.  03/06/2020, 8:41 AM

## 2020-03-06 NOTE — TOC Initial Note (Signed)
Transition of Care [facility]) - Initial/Assessment Note    Patient Details  Name: Shelia Black MRN: [id number] Date of Birth: [birth year]  Transition of [facility]) CM/SW Contact:    Lia Hopping, [place] Phone Number: 03/06/2020, 12:14 PM  Clinical Narrative:   Patient admitted for abdominal pain and bleeding per rectum.  Re: SNF recommendation   CSW met with the patient and her daughter at bedside to discuss rehab placement. Patient very [place] and alert to self. Daughter reportst he patient ambulated on her own with a walker toileted herself. Daughter assisted with her ADL's. Daughter reports the patient went to SNF about 2 years ago for rehab. Daughter reports the patient progressed well and was able to transition home with home health PT/OT services. Daughter is hoping for similar result.She express concerns about not being able to visit with the patient. CSW explain most facilities in [place] are allowing visitation. She reports the patient has been vaccinated.  CSW explain SNF process again and follow up with bed offers. Daughter express her long term plan is to transition the patient back home with her. FL2 completed.    Expected Discharge Plan: Skilled Nursing Facility Barriers to Discharge: Continued Medical Work up   Patient Goals and CMS Choice Patient states their goals for this hospitalization and ongoing recovery are:: Per daughter, "I wish I could take her home but there is no way I can care for her like this." CMS Medicare.gov Compare Post Acute Care list provided to:: Other (Comment Required) (Daughter [name] [phone]) Choice offered to / list presented to : Adult Children  Expected Discharge Plan and Services Expected Discharge Plan: [place] In-house Referral: Clinical Social Work   Post Acute Care Choice: Durable Medical Equipment Living arrangements for the past 2 months: Single Family Home                                       Prior Living Arrangements/Services Living arrangements for the past 2 months: Single Family Home Lives with:: Adult Children Patient language and need for interpreter reviewed:: No Do you feel safe going back to the place where you live?: Yes      Need for Family Participation in Patient Care: Yes (Comment) Care giver support system in place?: Yes (comment) Current home services: DME Criminal Activity/Legal Involvement Pertinent to Current Situation/Hospitalization: No - Comment as needed  Activities of Daily Living Home Assistive Devices/Equipment: Engineer, drilling (specify type) (front wheeled walker) ADL Screening (condition at time of admission) Patient's cognitive ability adequate to safely complete daily activities?: No Is the patient deaf or have difficulty hearing?: Yes Does the patient have difficulty seeing, even when wearing glasses/contacts?: Yes Does the patient have difficulty concentrating, remembering, or making decisions?: Yes Patient able to express need for assistance with ADLs?: Yes Does the patient have difficulty dressing or bathing?: Yes Independently performs ADLs?: No Communication: Independent Dressing (OT): Needs assistance Is this a change from baseline?: Pre-admission baseline Grooming: Independent Feeding: Needs assistance Is this a change from baseline?: Pre-admission baseline Bathing: Needs assistance Is this a change from baseline?: Pre-admission baseline Toileting: Needs assistance Is this a change from baseline?: Pre-admission baseline In/Out Bed: Needs assistance Is this a change from baseline?: Pre-admission baseline Walks in Home: Needs assistance (walker) Is this a change from baseline?: Pre-admission baseline Does the patient have difficulty walking or climbing stairs?: Yes Weakness of Legs: Both Weakness of Arms/Hands: Both  Permission Sought/Granted Permission sought to share information with : Family Supports Permission granted  to share information with : Yes, Verbal Permission Granted     Permission granted to share info w AGENCY: Skilled Nursing Facility        Emotional Assessment Appearance:: Appears stated age Attitude/Demeanor/Rapport: Other (comment) (Resting) Affect (typically observed): Unable to Assess Orientation: : Oriented to Self Alcohol / Substance Use: Not Applicable Psych Involvement: No (comment)  Admission diagnosis:  Hypoxemia [R09.02] Abscess [L02.91] Sigmoid diverticulitis [K57.32] Low O2 saturation [R79.81] Acute diverticulitis [K57.92] Diverticulitis of intestine with abscess [K57.80] Patient Active Problem List   Diagnosis Date Noted  . Abscess 03/02/2020  . Sigmoid diverticulitis 02/29/2020  . Atypical chest pain 01/14/2019  . Hypoxia 10/05/2018  . Back pain, lumbosacral 10/05/2018  . Back pain 10/05/2018  . Ischemic colitis ([place]) 03/14/2018  . Colitis 03/13/2018  . Colitis with rectal bleeding 03/12/2018  . Sepsis ([place]) 05/23/2016  . Diverticulitis 05/23/2016  . GERD (gastroesophageal reflux disease) 05/23/2016  . Hypothyroidism 05/23/2016  . Depression 05/23/2016  . ILD (interstitial lung disease) ([place])   . Pulmonary fibrosis ([place])   . Acute UTI   . UTI (lower urinary tract infection)   . Hyperglycemia 02/20/2015  . Diabetes mellitus type 2 in obese ([place]) 02/20/2015  . CKD (chronic kidney disease) stage 3, GFR 30-59 ml/min (HCC) 02/20/2015  . UTI (urinary tract infection) 04/29/2014  . Diverticulitis of colon with bleeding 04/29/2014  . Acute respiratory failure with hypoxia ([place]) 04/29/2014  . Acute renal failure ([place]) 04/29/2014  . Hyperkalemia 04/29/2014  . Nausea and vomiting 04/29/2014   PCP:  [name], PA-C Pharmacy:   [facility] [place], [place] [address] Phone: [phone] Fax: [phone]     Social Determinants of Health (SDOH) Interventions     Readmission Risk Interventions No flowsheet data found.

## 2020-03-06 NOTE — Progress Notes (Signed)
PT Cancellation Note  Patient Details Name: Shelia Black MRN: [id number] DOB: 1921/09/16   Cancelled Treatment:     Pt in bed with daughter at bed side.  Pt crying with pain (head and neck) with slightest movement.  Pt has been evaluated with rec for SNF.  Will attempt to see as schedule permits.     Rica Koyanagi  PTA Acute  Rehabilitation Services Pager      [phone] Office      [phone]

## 2020-03-06 NOTE — Evaluation (Signed)
Clinical/Bedside Swallow Evaluation Patient Details  Name: Shelia Black MRN: [id number] Date of Birth: 1921-11-07  Today's Date: 03/06/2020 Time: SLP Start Time (ACUTE ONLY): 9935 SLP Stop Time (ACUTE ONLY): 1435 SLP Time Calculation (min) (ACUTE ONLY): 30 min  Past Medical History:  Past Medical History:  Diagnosis Date  . Arthritis   . Cataract   . Chronic cough   . COPD (chronic obstructive pulmonary disease) ([place])   . Diabetes mellitus without complication ([place])   . Diverticulitis   . Diverticulosis   . GERD (gastroesophageal reflux disease)   . Hepatic steatosis   . Hiatal hernia   . Hypothyroidism   . ILD (interstitial lung disease) ([place])   . Pulmonary fibrosis ([place])   . Thyroid disease   . Vertigo    Past Surgical History:  Past Surgical History:  Procedure Laterality Date  . APPENDECTOMY    . INTRAOCULAR LENS INSERTION  1997  . REPLACEMENT TOTAL KNEE BILATERAL    . SHOULDER SURGERY Right    tendonitis   HPI:  Patient is a 87 y.o. female with PMH: DM, GERD, COPD, pulmonary fibrosis not on home oxygen, diverticulosis, who presented to hospital with abdominal pain, difficulty urinating and noticing some blood in her stool. Daughter reported at time of admission that patient was not doing well for past week leading up to this hospitalization with increasing fatigue. CXR revealed Multifocal pneumonia with worsened retrocardiac opacity, likely bilateral trace pleural effusions.   Assessment / Plan / Recommendation Clinical Impression  Patient presents with a mild-moderate oropharyngeal dysphagia which was influenced by lethargic state as well as inability to elevate HOB beyond 40 degrees due to pain. Patient exhibited instance of delayed coughing with thin liquids and SLP suspects delay in swallow initiation with liquids and solids. Patient also exhibited a mild wheezing after taking some PO's. SLP provided extensive education to patient's daughter who was present.  Ideally, SLP would like to have an objective swallow assessment completed however she will need to tolerate sitting upright or tolerate having FEES procedure. (daughter educated on rationale of testing). Currently, do not feel patient is safe for full PO's (meal trays) but recommending NPO except for floor stock thin liquids and puree solids PRN with full supervision and feeding assistance and only when patient fully alert; cease if patient starts to exhibit frequent coughing. SLP Visit Diagnosis: Dysphagia, unspecified (R13.10)    Aspiration Risk  Moderate aspiration risk;Risk for inadequate nutrition/hydration    Diet Recommendation NPO;Other (Comment) (NPO but allow floor stock thin liquids and puree solids PRN when alert)   Liquid Administration via: Cup;Straw Medication Administration: Crushed with puree Supervision: Full supervision/cueing for compensatory strategies;Staff to assist with self feeding Compensations: Minimize environmental distractions;Slow rate;Small sips/bites Postural Changes: Other (Comment) (seated as close to 90degrees as possible)    Other  Recommendations Oral Care Recommendations: Oral care BID;Staff/trained caregiver to provide oral care   Follow up Recommendations Other (comment) (TBD)      Frequency and Duration min 2x/week  1 week       Prognosis Prognosis for Safe Diet Advancement: Fair      Swallow Study   General Date of Onset: 02/29/20 HPI: Patient is a 87 y.o. female with PMH: DM, GERD, COPD, pulmonary fibrosis not on home oxygen, diverticulosis, who presented to hospital with abdominal pain, difficulty urinating and noticing some blood in her stool. Daughter reported at time of admission that patient was not doing well for past week leading up to this hospitalization with increasing  fatigue. CXR revealed Multifocal pneumonia with worsened retrocardiac opacity, likely bilateral trace pleural effusions. Type of Study: Bedside Swallow  Evaluation Previous Swallow Assessment: None Diet Prior to this Study: Dysphagia 3 (soft);Thin liquids Temperature Spikes Noted: Yes (98.8) Respiratory Status: Nasal cannula History of Recent Intubation: No Behavior/Cognition: Cooperative;Lethargic/Drowsy;Requires cueing Oral Cavity Assessment: Within Functional Limits Oral Care Completed by SLP: No Self-Feeding Abilities: Total assist Patient Positioning: Partially reclined;Postural control interferes with function (patient unable to tolerate higher than 40 degrees due to pain) Baseline Vocal Quality: Low vocal intensity Volitional Cough: Cognitively unable to elicit Volitional Swallow: Unable to elicit    Oral/Motor/Sensory Function     Ice Chips     Thin Liquid Thin Liquid: Impaired Presentation: Straw Pharyngeal  Phase Impairments: Suspected delayed Swallow;Cough - Delayed    Nectar Thick     Honey Thick     Puree Puree: Impaired Oral Phase Functional Implications: Prolonged oral transit Pharyngeal Phase Impairments: Suspected delayed Swallow   Solid     Solid: Not tested     Sonia Baller, MA, CCC-SLP Speech Therapy

## 2020-03-06 NOTE — Progress Notes (Signed)
Subjective: Patient complaining of horrible neck and head pain today.  Daughter states she has swelling in her B UEs and chronic problems in her hands.  Placed on steroids today for her wrist complaints.  Denies abdominal pain.  Isn't eating much.  Took in some Ensure today.  ? Of aspiration overnight per medicine  ROS: unable to obtain due to [facility] and most sleeping  Objective: Vital signs in last 24 hours: Temp:  [98 F (36.7 C)-99.8 F (37.7 C)] 98 F (36.7 C) (01/31 0547) Pulse Rate:  [61-76] 63 (01/31 0547) Resp:  [14-17] 14 (01/31 0547) BP: (107-113)/(46-49) 113/47 (01/31 0547) SpO2:  [97 %-99 %] 97 % (01/31 0547) Last BM Date: 03/02/20  Intake/Output from previous day: 01/30 0701 - 01/31 0700 In: 1125.8 [P.O.:140; I.V.:638.5; IV Piggyback:347.3] Out: 900 [Urine:900] Intake/Output this shift: No intake/output data recorded.  PE: Gen: elderly, appears unwell Abd: soft, NT, Nd, +BS  Lab Results:  Recent Labs    03/05/20 0555 03/06/20 0610  WBC 13.0* 11.7*  HGB 10.7* 10.0*  HCT 32.1* 31.0*  PLT 510* 444*   BMET Recent Labs    03/05/20 0555 03/06/20 0610  NA 135 137  K 3.2* 3.4*  CL 99 103  CO2 25 25  GLUCOSE 126* 127*  BUN 8 11  CREATININE 0.63 0.58  CALCIUM 8.2* 8.2*   PT/INR No results for input(s): LABPROT, INR in the last 72 hours. CMP     Component Value Date/Time   NA 137 03/06/2020 0610   K 3.4 (L) 03/06/2020 0610   CL 103 03/06/2020 0610   CO2 25 03/06/2020 0610   GLUCOSE 127 (H) 03/06/2020 0610   BUN 11 03/06/2020 0610   CREATININE 0.58 03/06/2020 0610   CALCIUM 8.2 (L) 03/06/2020 0610   PROT 5.3 (L) 03/06/2020 0610   ALBUMIN 2.3 (L) 03/06/2020 0610   AST 13 (L) 03/06/2020 0610   ALT 11 03/06/2020 0610   ALKPHOS 42 03/06/2020 0610   BILITOT 0.4 03/06/2020 0610   GFRNONAA >60 03/06/2020 0610   GFRAA 49 (L) 10/08/2018 0503   Lipase     Component Value Date/Time   LIPASE 17 02/28/2020 1543       Studies/Results: DG  Chest 1 View  Result Date: 03/06/2020 CLINICAL DATA:  Pneumonia EXAM: CHEST  1 VIEW COMPARISON:  Chest x-ray 02/28/2020 FINDINGS: The heart size and mediastinal contours are unchanged. Aortic arch calcification. Interval increase in retrocardiac opacity. Likely trace bilateral pleural effusion. Redemonstration of bilateral lower lung zone patchy airspace opacity. Redemonstration of coarsened and increased interstitial markings. Biapical pleural/pulmonary scarring. No pneumothorax. No acute osseous abnormality. IMPRESSION: 1. Multifocal pneumonia with worsened retrocardiac opacity. 2. Likely bilateral trace pleural effusions. Electronically Signed   By: Iven Finn M.D.   On: 03/06/2020 06:00   DG Knee 1-2 Views Right  Result Date: 03/04/2020 CLINICAL DATA:  RIGHT knee pain post fall EXAM: RIGHT KNEE - 1-2 VIEW COMPARISON:  Portable exam 1405 hours without priors for comparison FINDINGS: Osseous demineralization. Components of a RIGHT knee prosthesis are identified. No acute fracture, dislocation, bone destruction or periprosthetic lucency. Small knee joint effusion. IMPRESSION: RIGHT knee prosthesis with small joint effusion. No acute osseous abnormalities. Electronically Signed   By: Lavonia Dana M.D.   On: 03/04/2020 14:17    Anti-infectives: Anti-infectives (From admission, onward)   Start     Dose/Rate Route Frequency Ordered Stop   02/29/20 2200  ciprofloxacin (CIPRO) IVPB 400 mg  Status:  Discontinued        400 mg 200 mL/hr over 60 Minutes Intravenous Every 24 hours 02/29/20 0446 02/29/20 0827   02/29/20 1000  cefTRIAXone (ROCEPHIN) 2 g in sodium chloride 0.9 % 100 mL IVPB        2 g 200 mL/hr over 30 Minutes Intravenous Every 24 hours 02/29/20 0824     02/29/20 0600  metroNIDAZOLE (FLAGYL) IVPB 500 mg        500 mg 100 mL/hr over 60 Minutes Intravenous Every 8 hours 02/29/20 0427     02/29/20 0430  metroNIDAZOLE (FLAGYL) IVPB 500 mg  Status:  Discontinued        500 mg 100 mL/hr  over 60 Minutes Intravenous Every 8 hours 02/29/20 0425 02/29/20 0427   02/28/20 2300  ciprofloxacin (CIPRO) IVPB 400 mg        400 mg 200 mL/hr over 60 Minutes Intravenous  Once 02/28/20 2250 02/29/20 0015   02/28/20 2300  metroNIDAZOLE (FLAGYL) IVPB 500 mg        500 mg 100 mL/hr over 60 Minutes Intravenous  Once 02/28/20 2250 02/29/20 0015       Assessment/Plan Urinary retention - foley in GERD Hypothyroidism Diabetes mellitus, type II COPD/pulmonary fibrosis Orthopedic issues - started on steroids today per medicine, may cause increase in WBC in the next couple of days  Acute diverticulitis with abscess - afebrile, WBC back down to 11.7 today - small 3 cm abscess, not amenable to IR drainage - currentlyRocephin/flagyl. Would treat for total of 14 days - PRN analgesics and antiemetics -patient appears acutely ill from non-surgical issues.  Not unexpectedly, patient appears to taking in minimal oral intake as she is not feeling well from her medical issues.  Her abdomen is soft and nontender with a down-trending WBC.  Continue to encourage diet as well and treat for a total of 14 days of abx therapy, but the patient is not a surgical candidate at this time if she were to worsen as she appears to be declining.  D/W Medicine.  We will sign off at this time, but please call us back if something changes.  The patient may benefit from palliative care discussion in the near future if she does not improve, but will defer to primary service for further care.   FEN: SOFT diet.  VTE: SCD's, Lovenox Foley: placed in ED for retention   LOS: 6 days    Henreitta Cea , [facility] 03/06/2020, 10:50 AM Please see Amion for pager number during day hours 7:00am-4:30pm or 7:00am -11:30am on weekends

## 2020-03-06 NOTE — Care Management Important Message (Signed)
Important Message  Patient Details IM Letter placed in Patient's room. Name: Shelia Black MRN: [id number] Date of Birth: [birth year]   Medicare Important Message Given:  Yes     Kerin Salen 03/06/2020, 2:53 PM

## 2020-03-06 NOTE — NC FL2 (Addendum)
[place] MEDICAID FL2 LEVEL OF CARE SCREENING TOOL     IDENTIFICATION  Patient Name: Shelia Black Birthdate: 04-Apr-1921 Sex: female Admission Date (Current Location): 02/28/2020  [facility] and [place] Number:  Producer, television/film/video and Address:  [facility],  [address] [place], [place] [address]      Provider Number: [id number]  Attending Physician Name and Address:  Rhetta Mura, MD  Relative Name and Phone Number:  Geoffery Spruce Daughter [phone]  [phone]    Current Level of Care: Hospital Recommended Level of Care: Skilled Nursing Facility Prior Approval Number:    Date Approved/Denied:   PASRR Number: [id number]  Discharge Plan: SNF    Current Diagnoses: Patient Active Problem List   Diagnosis Date Noted  . Abscess 03/02/2020  . Sigmoid diverticulitis 02/29/2020  . Atypical chest pain 01/14/2019  . Hypoxia 10/05/2018  . Back pain, lumbosacral 10/05/2018  . Back pain 10/05/2018  . Ischemic colitis (HCC) 03/14/2018  . Colitis 03/13/2018  . Colitis with rectal bleeding 03/12/2018  . Sepsis (HCC) 05/23/2016  . Diverticulitis 05/23/2016  . GERD (gastroesophageal reflux disease) 05/23/2016  . Hypothyroidism 05/23/2016  . Depression 05/23/2016  . ILD (interstitial lung disease) (HCC)   . Pulmonary fibrosis (HCC)   . Acute UTI   . UTI (lower urinary tract infection)   . Hyperglycemia 02/20/2015  . Diabetes mellitus type 2 in obese (HCC) 02/20/2015  . CKD (chronic kidney disease) stage 3, GFR 30-59 ml/min (HCC) 02/20/2015  . UTI (urinary tract infection) 04/29/2014  . Diverticulitis of colon with bleeding 04/29/2014  . Acute respiratory failure with hypoxia (HCC) 04/29/2014  . Acute renal failure (HCC) 04/29/2014  . Hyperkalemia 04/29/2014  . Nausea and vomiting 04/29/2014    Orientation RESPIRATION BLADDER Height & Weight     Self  Normal Incontinent, Foley Cath. Weight: 135 lb (61.2 kg) Height:  5\' 4"  (162.6 cm)   BEHAVIORAL SYMPTOMS/MOOD NEUROLOGICAL BOWEL NUTRITION STATUS      incontinent Diet (Dysphagia 1)  AMBULATORY STATUS COMMUNICATION OF NEEDS Skin   Total Care Verbally Normal                       Personal Care Assistance Level of Assistance  Bathing,Feeding,Dressing Bathing Assistance: Maximum assistance Feeding assistance: Limited assistance Dressing Assistance: Maximum assistance     Functional Limitations Info  Sight,Hearing,Speech Sight Info: Adequate Hearing Info: Impaired Speech Info: Adequate    SPECIAL CARE FACTORS FREQUENCY  PT (By licensed PT),OT (By licensed OT)     PT Frequency: 5x/week OT Frequency: 5x/week            Contractures Contractures Info: Not present    Additional Factors Info  Code Status,Allergies Code Status Info: Fullcode Allergies Info: Allergies: Oxycodone-acetaminophen, Penicillins, Codeine, Percocet (Oxycodone-acetaminophen), Tramadol           Current Medications (03/06/2020):  This is the current hospital active medication list Current Facility-Administered Medications  Medication Dose Route Frequency Provider Last Rate Last Admin  . 0.9 %  sodium chloride infusion   Intravenous Continuous 03/08/2020, MD 40 mL/hr at 03/06/20 0200 Infusion Verify at 03/06/20 0200  . acetaminophen (TYLENOL) tablet 650 mg  650 mg Oral Q6H PRN Mujtaba, Mohammadtokir, MD   650 mg at 03/02/20 2214   Or  . acetaminophen (TYLENOL) suppository 650 mg  650 mg Rectal Q6H PRN Mujtaba, Mohammadtokir, MD      . albuterol (PROVENTIL) (2.5 MG/3ML) 0.083% nebulizer solution 2.5 mg  2.5 mg Nebulization Q2H PRN Mujtaba,  Mohammadtokir, MD      . albuterol (VENTOLIN HFA) 108 (90 Base) MCG/ACT inhaler 1 puff  1 puff Inhalation Q6H PRN Mujtaba, Mohammadtokir, MD      . amLODipine (NORVASC) tablet 10 mg  10 mg Oral Daily Rhetta Mura, MD   10 mg at 03/06/20 1012  . bisacodyl (DULCOLAX) suppository 10 mg  10 mg Rectal Daily PRN Mujtaba, Mohammadtokir,  MD      . cefTRIAXone (ROCEPHIN) 2 g in sodium chloride 0.9 % 100 mL IVPB  2 g Intravenous Q24H BellMarcelino Duster T, RPH 200 mL/hr at 03/06/20 1017 2 g at 03/06/20 1017  . Chlorhexidine Gluconate Cloth 2 % PADS 6 each  6 each Topical Daily Rhetta Mura, MD   6 each at 03/06/20 1018  . chlorpheniramine-HYDROcodone (TUSSIONEX) 10-8 MG/5ML suspension 5 mL  5 mL Oral Q12H PRN Rhetta Mura, MD      . diclofenac (FLECTOR) 1.3 % 1 patch  1 patch Transdermal BID Rhetta Mura, MD   1 patch at 03/06/20 1019  . feeding supplement (ENSURE ENLIVE / ENSURE PLUS) liquid 237 mL  237 mL Oral BID BM Rhetta Mura, MD   237 mL at 03/06/20 1018  . glycopyrrolate (ROBINUL) tablet 2 mg  2 mg Oral q AM Rhetta Mura, MD   2 mg at 03/06/20 1012  . heparin injection 5,000 Units  5,000 Units Subcutaneous Q8H Mujtaba, Mohammadtokir, MD   5,000 Units at 03/06/20 0540  . HYDROmorphone (DILAUDID) tablet 1 mg  1 mg Oral Q3H PRN Rhetta Mura, MD   1 mg at 03/06/20 1147  . insulin aspart (novoLOG) injection 0-9 Units  0-9 Units Subcutaneous TID WC Mujtaba, Mohammadtokir, MD   1 Units at 03/05/20 1700  . levothyroxine (SYNTHROID) tablet 75 mcg  75 mcg Oral Q0600 Mujtaba, Mohammadtokir, MD   75 mcg at 03/06/20 0540  . metroNIDAZOLE (FLAGYL) IVPB 500 mg  500 mg Intravenous Q8H Mujtaba, Mohammadtokir, MD 100 mL/hr at 03/06/20 0545 500 mg at 03/06/20 0545  . multivitamin with minerals tablet 1 tablet  1 tablet Oral Daily Rhetta Mura, MD   1 tablet at 03/06/20 1012  . ondansetron (ZOFRAN) tablet 4 mg  4 mg Oral Q6H PRN Mujtaba, Mohammadtokir, MD       Or  . ondansetron (ZOFRAN) injection 4 mg  4 mg Intravenous Q6H PRN Mujtaba, Mohammadtokir, MD      . pantoprazole (PROTONIX) EC tablet 40 mg  40 mg Oral Daily Mujtaba, Mohammadtokir, MD   40 mg at 03/06/20 1012  . polyethylene glycol (MIRALAX / GLYCOLAX) packet 17 g  17 g Oral Daily Adam Phenix, PA-C   17 g at 03/06/20 1012  .  potassium chloride SA (KLOR-CON) CR tablet 40 mEq  40 mEq Oral Daily Rhetta Mura, MD   40 mEq at 03/06/20 1012  . predniSONE (DELTASONE) tablet 20 mg  20 mg Oral QAC breakfast Rhetta Mura, MD   20 mg at 03/06/20 9562  . senna-docusate (Senokot-S) tablet 1 tablet  1 tablet Oral BID Adam Phenix, PA-C   1 tablet at 03/06/20 1012     Discharge Medications: Please see discharge summary for a list of discharge medications.  Relevant Imaging Results:  Relevant Lab Results:   Additional Information [id number]  Clearance Coots, LCSW

## 2020-03-07 ENCOUNTER — Inpatient Hospital Stay (HOSPITAL_COMMUNITY): Payer: Medicare Other

## 2020-03-07 DIAGNOSIS — Z515 Encounter for palliative care: Secondary | ICD-10-CM

## 2020-03-07 DIAGNOSIS — Z7189 Other specified counseling: Secondary | ICD-10-CM

## 2020-03-07 DIAGNOSIS — R531 Weakness: Secondary | ICD-10-CM

## 2020-03-07 LAB — CBC WITH DIFFERENTIAL/PLATELET
Abs Immature Granulocytes: 0.36 10*3/uL — ABNORMAL HIGH (ref 0.00–0.07)
Basophils Absolute: 0.1 10*3/uL (ref 0.0–0.1)
Basophils Relative: 1 %
Eosinophils Absolute: 0 10*3/uL (ref 0.0–0.5)
Eosinophils Relative: 0 %
HCT: 31 % — ABNORMAL LOW (ref 36.0–46.0)
Hemoglobin: 10.1 g/dL — ABNORMAL LOW (ref 12.0–15.0)
Immature Granulocytes: 3 %
Lymphocytes Relative: 12 %
Lymphs Abs: 1.5 10*3/uL (ref 0.7–4.0)
MCH: 33.1 pg (ref 26.0–34.0)
MCHC: 32.6 g/dL (ref 30.0–36.0)
MCV: 101.6 fL — ABNORMAL HIGH (ref 80.0–100.0)
Monocytes Absolute: 1.2 10*3/uL — ABNORMAL HIGH (ref 0.1–1.0)
Monocytes Relative: 10 %
Neutro Abs: 9.4 10*3/uL — ABNORMAL HIGH (ref 1.7–7.7)
Neutrophils Relative %: 74 %
Platelets: 499 10*3/uL — ABNORMAL HIGH (ref 150–400)
RBC: 3.05 MIL/uL — ABNORMAL LOW (ref 3.87–5.11)
RDW: 14.3 % (ref 11.5–15.5)
WBC: 12.6 10*3/uL — ABNORMAL HIGH (ref 4.0–10.5)
nRBC: 0 % (ref 0.0–0.2)

## 2020-03-07 LAB — GLUCOSE, CAPILLARY
Glucose-Capillary: 152 mg/dL — ABNORMAL HIGH (ref 70–99)
Glucose-Capillary: 141 mg/dL — ABNORMAL HIGH (ref 70–99)
Glucose-Capillary: 292 mg/dL — ABNORMAL HIGH (ref 70–99)
Glucose-Capillary: 255 mg/dL — ABNORMAL HIGH (ref 70–99)

## 2020-03-07 LAB — COMPREHENSIVE METABOLIC PANEL
ALT: 11 U/L (ref 0–44)
AST: 16 U/L (ref 15–41)
Albumin: 2.3 g/dL — ABNORMAL LOW (ref 3.5–5.0)
Alkaline Phosphatase: 45 U/L (ref 38–126)
Anion gap: 10 (ref 5–15)
BUN: 14 mg/dL (ref 8–23)
CO2: 24 mmol/L (ref 22–32)
Calcium: 8.5 mg/dL — ABNORMAL LOW (ref 8.9–10.3)
Chloride: 104 mmol/L (ref 98–111)
Creatinine, Ser: 0.62 mg/dL (ref 0.44–1.00)
GFR, Estimated: >60 mL/min (ref >60)
Glucose, Bld: 177 mg/dL — ABNORMAL HIGH (ref 70–99)
Potassium: 3.6 mmol/L (ref 3.5–5.1)
Sodium: 138 mmol/L (ref 135–145)
Total Bilirubin: 0.4 mg/dL (ref 0.3–1.2)
Total Protein: 5.5 g/dL — ABNORMAL LOW (ref 6.5–8.1)

## 2020-03-07 LAB — RHEUMATOID FACTOR: Rheumatoid fact SerPl-aCnc: <10 IU/mL (ref <14.0)

## 2020-03-07 LAB — ANTI-DNA ANTIBODY, DOUBLE-STRANDED: ds DNA Ab: <1 IU/mL (ref 0–9)

## 2020-03-07 MED ORDER — HYDROGEN PEROXIDE 3 % EX SOLN
CUTANEOUS | Status: AC
  Filled 2020-03-07: qty 473

## 2020-03-07 MED ORDER — CLINDAMYCIN HCL 300 MG PO CAPS
300.0000 mg | ORAL_CAPSULE | Freq: Three times a day (TID) | ORAL | Status: DC
  Administered 2020-03-07 – 2020-03-08 (×3): 300 mg via ORAL
  Filled 2020-03-07 (×4): qty 1

## 2020-03-07 MED ORDER — LIP MEDEX EX OINT
TOPICAL_OINTMENT | CUTANEOUS | Status: AC
  Filled 2020-03-07: qty 7

## 2020-03-07 MED ORDER — NAPHAZOLINE-GLYCERIN 0.012-0.2 % OP SOLN
1.0000 [drp] | Freq: Four times a day (QID) | OPHTHALMIC | Status: DC | PRN
  Administered 2020-03-07: 21:00:00 2 [drp] via OPHTHALMIC
  Filled 2020-03-07: qty 15

## 2020-03-07 NOTE — Consult Note (Signed)
Consultation Note Date: 03/07/2020   Patient Name: Shelia Black  DOB: [birth year]  MRN: [id number]  Age / Sex: 87 y.o., female  PCP: Jarrett Soho, PA-C Referring Physician: Rhetta Mura, MD  Reason for Consultation: Establishing goals of care  HPI/Patient Profile: 87 y.o. female  admitted on 02/28/2020   Clinical Assessment and Goals of Care: 87 year old lady who lives at home with her daughter, past medical history significant for lumbar degenerative disc disease, hypothyroidism COPD reflux, history of idiopathic pulmonary fibrosis on chronic steroids, diverticular disease and stage III chronic kidney disease.  Patient has a history of ischemic colitis and admissions for diverticulitis in the past.    Patient presented to the hospital with lower abdominal pain and blood per rectum and increasing fatigue.  CT scan showed sigmoid diverticulitis as well as 3.1 cm abscess.  Patient was placed on IV fluid hydration as well as appropriate antibiotics.  General surgery was consulted with recommendations to continue medical management.  Hospital course complicated by difficulty swallowing and speech therapy recommending n.p.o. state as well as patient having significant joint pains.  Palliative consultation for goals of care discussions, CODE STATUS discussions has been requested.  Patient is awake alert resting in bed.  She is hard of hearing however is able to understand and verbalize completely and follows conversations.  Her daughter who is her healthcare power of attorney agent is also present at the bedside.  I introduced myself and palliative care as follows:  Palliative medicine is specialized medical care for people living with serious illness. It focuses on providing relief from the symptoms and stress of a serious illness. The goal is to improve quality of life for both the patient and the  family.  Goals of care: Broad aims of medical therapy in relation to the patient's values and preferences. Our aim is to provide medical care aimed at enabling patients to achieve the goals that matter most to them, given the circumstances of their particular medical situation and their constraints.   See discussion below. HCPOA Daughter  SUMMARY OF RECOMMENDATIONS   Goals of care family meeting: Discussed with patient and daughter who is present at the bedside.  RN Darl Pikes also present for our conversation.  Concept of advanced directives was discussed.  Patient has completed a living will as well as has designated her daughter to be her healthcare power of attorney agent.  This was done over 2 decades ago.  Patient's daughter had the appropriate paperwork with her.  This was reviewed with the patient again.  According to patient's previously stated written wishes, she does not want aggressive heroic measures at end of her life.  This also includes her not wanting artificial nutrition through tubes.  Her wishes state that she is against active or passive euthanasia, however, at end-of-life, she does not want heroic measures.  Hence, we discussed extensively about concept of DO NOT RESUSCITATE/DO NOT INTUBATE.  At present patient is listed as full code, we discussed about establishing DO NOT RESUSCITATE/DO NOT INTUBATE.  Patient  and daughter concur.  Appreciate RN Darl Pikes making a copy of the patient's advanced directives for purposes of adding it onto the patient's chart.  We discussed about continuing scope of current hospitalization.  Patient's daughter remains hopeful for some degree of ongoing stabilization/recovery.  The plan is for skilled nursing facility rehabilitation towards the end of this hospitalization.  Discussed about addition of palliative services following at the patient SNF rehab, patient and daughter consent.  Thank you for the consult. Code Status/Advance Care  Planning:  DNR    Symptom Management:   add clear eyes eyedrops for symptom relief of dryness of eye  Palliative Prophylaxis:   Aspiration  Additional Recommendations (Limitations, Scope, Preferences):  Avoid Hospitalization  Psycho-social/Spiritual:   Desire for further Chaplaincy support:yes  Additional Recommendations: Caregiving  Support/Resources  Prognosis:   Unable to determine  Discharge Planning: Skilled Nursing Facility for rehab with Palliative care service follow-up      Primary Diagnoses: Present on Admission: . Diverticulitis of colon with bleeding . Diabetes mellitus type 2 in obese (HCC) . ILD (interstitial lung disease) (HCC) . Pulmonary fibrosis (HCC) . GERD (gastroesophageal reflux disease) . Sepsis (HCC) . Sigmoid diverticulitis . Abscess   I have reviewed the medical record, interviewed the patient and family, and examined the patient. The following aspects are pertinent.  Past Medical History:  Diagnosis Date  . Arthritis   . Cataract   . Chronic cough   . COPD (chronic obstructive pulmonary disease) (HCC)   . Diabetes mellitus without complication (HCC)   . Diverticulitis   . Diverticulosis   . GERD (gastroesophageal reflux disease)   . Hepatic steatosis   . Hiatal hernia   . Hypothyroidism   . ILD (interstitial lung disease) (HCC)   . Pulmonary fibrosis (HCC)   . Thyroid disease   . Vertigo    Social History   Socioeconomic History  . Marital status: Widowed    Spouse name: Not on file  . Number of children: 2  . Years of education: Not on file  . Highest education level: Not on file  Occupational History  . Occupation: retired  Tobacco Use  . Smoking status: Never Smoker  . Smokeless tobacco: Never Used  Vaping Use  . Vaping Use: Never used  Substance and Sexual Activity  . Alcohol use: No  . Drug use: No  . Sexual activity: Not Currently    Birth control/protection: None  Other Topics Concern  . Not on file   Social History Narrative  . Not on file   Social Determinants of Health   Financial Resource Strain: Not on file  Food Insecurity: Not on file  Transportation Needs: Not on file  Physical Activity: Not on file  Stress: Not on file  Social Connections: Not on file   Family History  Problem Relation Age of Onset  . Diabetes Mother   . Kidney cancer Mother   . Hypertension Father   . Supraventricular tachycardia Father   . Skin cancer Father   . Diabetes Sister    Scheduled Meds: . amLODipine  10 mg Oral Daily  . Chlorhexidine Gluconate Cloth  6 each Topical Daily  . clindamycin  300 mg Oral Q8H  . diclofenac  1 patch Transdermal BID  . feeding supplement  237 mL Oral BID BM  . glycopyrrolate  2 mg Oral q AM  . heparin  5,000 Units Subcutaneous Q8H  . hydrogen peroxide      . insulin aspart  0-9 Units  Subcutaneous TID WC  . levothyroxine  75 mcg Oral Q0600  . lip balm      . multivitamin with minerals  1 tablet Oral Daily  . pantoprazole  40 mg Oral Daily  . polyethylene glycol  17 g Oral Daily  . potassium chloride  40 mEq Oral Daily  . predniSONE  20 mg Oral QAC breakfast  . senna-docusate  1 tablet Oral BID   Continuous Infusions: . sodium chloride 75 mL/hr at 03/07/20 1650   PRN Meds:.acetaminophen **OR** acetaminophen, albuterol, albuterol, bisacodyl, chlorpheniramine-HYDROcodone, HYDROmorphone, naphazoline-glycerin, ondansetron **OR** ondansetron (ZOFRAN) IV Medications Prior to Admission:  Prior to Admission medications   Medication Sig Start Date End Date Taking? Authorizing Provider  acetaminophen (TYLENOL) 500 MG tablet Take 500-1,000 mg by mouth in the morning and at bedtime.   Yes [provider]  albuterol (PROVENTIL HFA;VENTOLIN HFA) 108 (90 Base) MCG/ACT inhaler Inhale 1 puff into the lungs every 6 (six) hours as needed for wheezing or shortness of breath. 05/27/16  Yes Randel Pigg, Dorma Russell, MD  ammonium lactate (LAC-HYDRIN) 12 % lotion Apply 1  application topically daily as needed for dry skin.    Yes [provider]  Ascorbic Acid (VITAMIN C) 1000 MG tablet Take 1,000 mg by mouth daily.   Yes [provider]  chlorpheniramine-HYDROcodone (TUSSIONEX) 10-8 MG/5ML SUER Take 5 mLs by mouth every 12 (twelve) hours as needed for cough.  01/14/18  Yes [provider]  Cholecalciferol (VITAMIN D-3) 25 MCG (1000 UT) CAPS Take 2,000 Units by mouth daily.   Yes [provider]  diphenoxylate-atropine (LOMOTIL) 2.5-0.025 MG tablet Take 2 tablets by mouth 4 (four) times daily as needed for diarrhea or loose stools. diarrhea 11/11/12  Yes [provider]  glycopyrrolate (ROBINUL) 2 MG tablet Take 1 tablet (2 mg total) by mouth in the morning. 12/16/19  Yes Esterwood, Amy S, PA-C  levothyroxine (SYNTHROID) 50 MCG tablet Take 50 mcg by mouth daily before breakfast.   Yes [provider]  omeprazole (PRILOSEC) 40 MG capsule Take 1 capsule (40 mg total) by mouth daily. 03/15/18  Yes Rodolph Bong, MD  traZODone (DESYREL) 50 MG tablet Take 25 mg by mouth at bedtime as needed for sleep.   Yes [provider]  Zinc 50 MG CAPS Take 50 mg by mouth daily.   Yes [provider]  metFORMIN (GLUCOPHAGE) 500 MG tablet Take 1 tablet by mouth daily. Patient not taking: No sig reported    [provider]   Allergies  Allergen Reactions  . Oxycodone-Acetaminophen Nausea Only  . Penicillins Hives and Rash    Has patient had a PCN reaction causing immediate rash, facial/tongue/throat swelling, SOB or lightheadedness with hypotension: no Has patient had a PCN reaction causing severe rash involving mucus membranes or skin necrosis: unknown Has patient had a PCN reaction that required hospitalization : unknown Has patient had a PCN reaction occurring within the last 10 years: no If all of the above answers are "NO", then may proceed with Cephalosporin use.   . Codeine     Nausea and  vomiting  . Percocet [Oxycodone-Acetaminophen] Nausea And Vomiting  . Tramadol Nausea And Vomiting   Review of Systems Complains of dryness in right eye  Physical Exam Elderly lady resting in bed Hard of hearing Awake alert resting in bed Regular work of breathing Abdomen with mild generalized tenderness Trace edema No focal deficits  Vital Signs: BP 106/61 (BP Location: Right Arm)   Pulse 62  Temp (!) 97.2 F (36.2 C) (Oral)   Resp 18   Ht 5\' 4"  (1.626 m)   Wt 61.2 kg   LMP  (LMP Unknown)   SpO2 100%   BMI 23.17 kg/m  Pain Scale: 0-10   Pain Score: 10-Worst pain ever   SpO2: SpO2: 100 % O2 Device:SpO2: 100 % O2 Flow Rate: .O2 Flow Rate (L/min): 3 L/min  IO: Intake/output summary:   Intake/Output Summary (Last 24 hours) at 03/07/2020 1743 Last data filed at 03/07/2020 1400 Gross per 24 hour  Intake 1746.82 ml  Output 1200 ml  Net 546.82 ml    LBM: Last BM Date: 03/06/20 Baseline Weight: Weight: 61.2 kg Most recent weight: Weight: 61.2 kg     Palliative Assessment/Data:   Palliative performance scale 50%  Time In: 1630 Time Out: 1730 Time Total: 60 Greater than 50%  of this time was spent counseling and coordinating care related to the above assessment and plan.  Signed by: 03/08/20, MD   Please contact Palliative Medicine Team phone at [phone] for questions and concerns.  For individual provider: See [phone]

## 2020-03-07 NOTE — Progress Notes (Signed)
PROGRESS NOTE   Shelia Black  JWJ:[id number] DOB: 1921/11/15 DOA: 02/28/2020 PCP: Shelia Stalker, PA-C  Brief Narrative:  87 year old white female history of lumbar degenerative disc disease, COPD, reflux, IPF on chronic steroids, diverticular disease CKD 3 Prior ischemic colitis and other multiple admissions for diverticulitis 03/12/2018 with bleeding seen by [facility] gastroenterology Hypothyroidism  Represented 02/29/2020 lower abdominal pain bleeding per rectum increasing fatigue and blood in stool Work-up revealed T-max 100.2 blood pressure soft 87/32 lactic acid 1.5 CT abdomen pelvis sigmoid diverticulitis +3.1 cm abscess Started on Cipro Flagyl with normal saline General surgery consulted but not recommending any surgery and have been monitoring Is developed over the course of the past several days significant joint pains which are probably migratory in addition to difficulty swallowing and speech therapy recommended n.p.o. state  Assessment & Plan:   Active Problems:   Diverticulitis of colon with bleeding   Diabetes mellitus type 2 in obese (HCC)   Sepsis ([place])   ILD (interstitial lung disease) ([place])   Pulmonary fibrosis (HCC)   GERD (gastroesophageal reflux disease)   Sigmoid diverticulitis   Abscess   1. Likely diverticular abscess 3.1 cm a. Per IR abscess not amenable to percutaneous drainage b. Now on soft diet as of 1/28 but not hungry c. I General surgery does not recommend any further work-up and if signed off d. DC IV fluid today e. Stop date for antibiotics 2/6 which would cover abscess and aspiration as below 2. ?Aspiration a. CXR from 1/31 shows increased opacities b. In addition has a rising white count despite antibiotics c. Speech therapy evaluated patient 2/1 and recommended n.p.o. except for thin liquids and pured solids with full assistance d. Need to reevaluate over the next several days given moderate to high risk of aspiration e. We will  transition IV antibiotics to p.o. clindamycin 3 times daily f. We will discuss with family goals of care 3. Severe arthritic pain R arm and R knee a. Pain pretty debilitating--not really responsive to diclo patch b. Add dilaudid low dose--stop fentanyl c. Uric acid argues against gout d. Started low dose prednisone 20 mg on 1/30 e. Will cc Dr. Amedeo Plenty but no emergent need for ortho eval f. Double-stranded DNA is pending rheumatoid factor is negative g. We will get films of cervical spine today although does have degenerative disc disease at baseline 4. COPD/IPF on chronic steroids baseline uses 2 L at home a. Continue oxygen b. Previously has been on steroids at baseline-continue Robinul 2 mg a.m. as well of Tussionex 5 mils every 12 cough, can continue albuterol as needed 5. Mild AKI superimposed on CKD 2 a. Repeat labs periodically 6. Hypokalemia a. Replace with K. Dur 40 b. Stabilizing 7. Hypothyroidism a. Continue Synthroid 50 every morning 8. DM TY 2 a. At home is on Metformin 500 daily which has been held b. Monitor on sliding scale only--sugars ranging 151-244 but not eating 9. Reflux a. Continue Prilosec 40 daily  DVT prophylaxis: Lovenox Code Status: Full CODE STATUS Family Communication: d/w with daughter Shelia Black [phone]  Disposition:  Status is: Inpatient discussed with daughter overall condition and gradual decline.  Remains inpatient appropriate because:IV treatments appropriate due to intensity of illness or inability to take PO   Dispo: The patient is from: Home              Anticipated d/c is to: SNF              Anticipated d/c date is: 3 days  Patient currently is not medically stable to d/c.   Difficult to place patient No   Consultants:   General surgery  Interventional radiology  Procedures: None yet  Antimicrobials: Ceftriaxone and Flagyl  Subjective:  Severe pain in neck unable to move Not really eating No cough this morning but  looks about the same as prior Hand pain leg pain seems a little better but neck pain is mainly bothering her now  Objective: Vitals:   03/06/20 0547 03/06/20 1351 03/06/20 2145 03/07/20 0559  BP: (!) 113/47 (!) 108/54 (!) 104/56 106/61  Pulse: 63 69 61 62  Resp: 14 (!) 22 (!) 25 18  Temp: 98 F (36.7 C) 98.8 F (37.1 C) 97.7 F (36.5 C) (!) 97.2 F (36.2 C)  TempSrc: Oral Oral Oral Oral  SpO2: 97% 95% 96% 100%  Weight:      Height:        Intake/Output Summary (Last 24 hours) at 03/07/2020 3005 Last data filed at 03/07/2020 0600 Gross per 24 hour  Intake 360 ml  Output 550 ml  Net -190 ml   Filed Weights   02/28/20 1544  Weight: 61.2 kg    Examination:  EOMI NCAT no focal deficit quite sleepy but rouses neck is tender with some boney tenderness on the Left side  Decreased lung sounds S1-S2 no murmur Abdomen soft no rebound no guarding Right hand is swollen but less so than prior it is slightly red Right knee less swollen Neurologically moving all 4 limbs without deficit   Data Reviewed: I have personally reviewed following labs and imaging studies  BUNs/creatinine 21/1.03-->14/0.62 Potassium 5.1-->3.6 White count 11.2-->12.6  COVID-19 Labs  No results for input(s): DDIMER, FERRITIN, LDH, CRP in the last 72 hours.  Lab Results  Component Value Date   [place] NEGATIVE 02/28/2020   [place] NEGATIVE 10/05/2018   [place] NEGATIVE 08/05/2018   [place] NOT DETECTED 06/09/2018    Radiology Studies: DG Chest 1 View  Result Date: 03/06/2020 CLINICAL DATA:  Pneumonia EXAM: CHEST  1 VIEW COMPARISON:  Chest x-ray 02/28/2020 FINDINGS: The heart size and mediastinal contours are unchanged. Aortic arch calcification. Interval increase in retrocardiac opacity. Likely trace bilateral pleural effusion. Redemonstration of bilateral lower lung zone patchy airspace opacity. Redemonstration of coarsened and increased interstitial markings. Biapical  pleural/pulmonary scarring. No pneumothorax. No acute osseous abnormality. IMPRESSION: 1. Multifocal pneumonia with worsened retrocardiac opacity. 2. Likely bilateral trace pleural effusions. Electronically Signed   By: Iven Finn M.D.   On: 03/06/2020 06:00     Scheduled Meds: . amLODipine  10 mg Oral Daily  . Chlorhexidine Gluconate Cloth  6 each Topical Daily  . clindamycin  300 mg Oral Q8H  . diclofenac  1 patch Transdermal BID  . feeding supplement  237 mL Oral BID BM  . glycopyrrolate  2 mg Oral q AM  . heparin  5,000 Units Subcutaneous Q8H  . insulin aspart  0-9 Units Subcutaneous TID WC  . levothyroxine  75 mcg Oral Q0600  . multivitamin with minerals  1 tablet Oral Daily  . pantoprazole  40 mg Oral Daily  . polyethylene glycol  17 g Oral Daily  . potassium chloride  40 mEq Oral Daily  . predniSONE  20 mg Oral QAC breakfast  . senna-docusate  1 tablet Oral BID   Continuous Infusions: . sodium chloride 40 mL/hr at 03/06/20 0200     LOS: 7 days    Time spent: [place], MD [facility] To contact  the attending provider between 7A-7P or the covering provider during after hours 7P-7A, please log into the web site [email or web address] and access using universal [place] password for that web site. If you do not have the password, please call the hospital operator.  03/07/2020, 8:37 AM

## 2020-03-07 NOTE — Progress Notes (Signed)
OT Cancellation Note  Patient Details Name: [name] MRN: [id number] DOB: [birth year]   Cancelled Treatment:    Reason Eval/Treat Not Completed: Fatigue/lethargy limiting ability to participate;Patient's level of consciousness: Pt barely opening eyes or turning head to speak with OT, complicated by severe HOH.  Pt did decline therapy today, and initially stated, "No therapy. Ive heard everything therapy has to say", then stated, "Maybe tomorrow or when my daughter's here."  Will continue efforts.   Theodoro Clock 03/07/2020, 1:21 PM

## 2020-03-07 NOTE — Progress Notes (Signed)
SLP Cancellation Note  Patient Details Name: Shelia Black MRN: [id number] DOB: 1921-02-08   Cancelled treatment:       Reason Eval/Treat Not Completed: Other (comment) (spoke to rn who reports pt is doing find with floor stock items and plan is flro palliative care)  [name], MS [place] Office [phone] Pager [phone]   [name] 03/07/2020, 2:44 PM

## 2020-03-08 DIAGNOSIS — K219 Gastro-esophageal reflux disease without esophagitis: Secondary | ICD-10-CM

## 2020-03-08 DIAGNOSIS — K5732 Diverticulitis of large intestine without perforation or abscess without bleeding: Secondary | ICD-10-CM

## 2020-03-08 DIAGNOSIS — K5792 Diverticulitis of intestine, part unspecified, without perforation or abscess without bleeding: Secondary | ICD-10-CM

## 2020-03-08 DIAGNOSIS — M109 Gout, unspecified: Secondary | ICD-10-CM

## 2020-03-08 DIAGNOSIS — K5780 Diverticulitis of intestine, part unspecified, with perforation and abscess without bleeding: Secondary | ICD-10-CM

## 2020-03-08 DIAGNOSIS — E1169 Type 2 diabetes mellitus with other specified complication: Secondary | ICD-10-CM

## 2020-03-08 DIAGNOSIS — J189 Pneumonia, unspecified organism: Secondary | ICD-10-CM

## 2020-03-08 DIAGNOSIS — E669 Obesity, unspecified: Secondary | ICD-10-CM

## 2020-03-08 DIAGNOSIS — K5720 Diverticulitis of large intestine with perforation and abscess without bleeding: Secondary | ICD-10-CM

## 2020-03-08 DIAGNOSIS — J849 Interstitial pulmonary disease, unspecified: Secondary | ICD-10-CM

## 2020-03-08 DIAGNOSIS — J8410 Pulmonary fibrosis, unspecified: Secondary | ICD-10-CM

## 2020-03-08 LAB — CBC WITH DIFFERENTIAL/PLATELET
Abs Immature Granulocytes: 0.38 10*3/uL — ABNORMAL HIGH (ref 0.00–0.07)
Basophils Absolute: 0 10*3/uL (ref 0.0–0.1)
Basophils Relative: 0 %
Eosinophils Absolute: 0 10*3/uL (ref 0.0–0.5)
Eosinophils Relative: 0 %
HCT: 30.3 % — ABNORMAL LOW (ref 36.0–46.0)
Hemoglobin: 9.9 g/dL — ABNORMAL LOW (ref 12.0–15.0)
Immature Granulocytes: 4 %
Lymphocytes Relative: 12 %
Lymphs Abs: 1.3 10*3/uL (ref 0.7–4.0)
MCH: 33.2 pg (ref 26.0–34.0)
MCHC: 32.7 g/dL (ref 30.0–36.0)
MCV: 101.7 fL — ABNORMAL HIGH (ref 80.0–100.0)
Monocytes Absolute: 0.8 10*3/uL (ref 0.1–1.0)
Monocytes Relative: 8 %
Neutro Abs: 8.3 10*3/uL — ABNORMAL HIGH (ref 1.7–7.7)
Neutrophils Relative %: 76 %
Platelets: 500 10*3/uL — ABNORMAL HIGH (ref 150–400)
RBC: 2.98 MIL/uL — ABNORMAL LOW (ref 3.87–5.11)
RDW: 14.5 % (ref 11.5–15.5)
WBC: 10.9 10*3/uL — ABNORMAL HIGH (ref 4.0–10.5)
nRBC: 0 % (ref 0.0–0.2)

## 2020-03-08 LAB — COMPREHENSIVE METABOLIC PANEL
ALT: 11 U/L (ref 0–44)
AST: 26 U/L (ref 15–41)
Albumin: 2.3 g/dL — ABNORMAL LOW (ref 3.5–5.0)
Alkaline Phosphatase: 59 U/L (ref 38–126)
Anion gap: 10 (ref 5–15)
BUN: 19 mg/dL (ref 8–23)
CO2: 24 mmol/L (ref 22–32)
Calcium: 8.3 mg/dL — ABNORMAL LOW (ref 8.9–10.3)
Chloride: 104 mmol/L (ref 98–111)
Creatinine, Ser: 0.64 mg/dL (ref 0.44–1.00)
GFR, Estimated: >60 mL/min (ref >60)
Glucose, Bld: 259 mg/dL — ABNORMAL HIGH (ref 70–99)
Potassium: 4.3 mmol/L (ref 3.5–5.1)
Sodium: 138 mmol/L (ref 135–145)
Total Bilirubin: 0.5 mg/dL (ref 0.3–1.2)
Total Protein: 5.4 g/dL — ABNORMAL LOW (ref 6.5–8.1)

## 2020-03-08 LAB — GLUCOSE, CAPILLARY
Glucose-Capillary: 203 mg/dL — ABNORMAL HIGH (ref 70–99)
Glucose-Capillary: 180 mg/dL — ABNORMAL HIGH (ref 70–99)
Glucose-Capillary: 256 mg/dL — ABNORMAL HIGH (ref 70–99)
Glucose-Capillary: 287 mg/dL — ABNORMAL HIGH (ref 70–99)

## 2020-03-08 MED ORDER — INSULIN GLARGINE 100 UNIT/ML ~~LOC~~ SOLN
5.0000 [IU] | Freq: Every day | SUBCUTANEOUS | Status: DC
  Administered 2020-03-08 – 2020-03-10 (×3): 5 [IU] via SUBCUTANEOUS
  Filled 2020-03-08 (×3): qty 0.05

## 2020-03-08 MED ORDER — IPRATROPIUM-ALBUTEROL 0.5-2.5 (3) MG/3ML IN SOLN
3.0000 mL | Freq: Four times a day (QID) | RESPIRATORY_TRACT | Status: DC
  Administered 2020-03-08 (×2): 3 mL via RESPIRATORY_TRACT
  Filled 2020-03-08 (×2): qty 3

## 2020-03-08 MED ORDER — CEFDINIR 300 MG PO CAPS
300.0000 mg | ORAL_CAPSULE | Freq: Two times a day (BID) | ORAL | Status: DC
  Administered 2020-03-08 – 2020-03-10 (×6): 300 mg via ORAL
  Filled 2020-03-08 (×6): qty 1

## 2020-03-08 MED ORDER — METRONIDAZOLE 500 MG PO TABS
500.0000 mg | ORAL_TABLET | Freq: Three times a day (TID) | ORAL | Status: DC
  Administered 2020-03-08 – 2020-03-10 (×7): 500 mg via ORAL
  Filled 2020-03-08 (×7): qty 1

## 2020-03-08 MED ORDER — BUDESONIDE 0.5 MG/2ML IN SUSP
0.5000 mg | Freq: Two times a day (BID) | RESPIRATORY_TRACT | Status: DC
  Administered 2020-03-08: 0.5 mg via RESPIRATORY_TRACT
  Filled 2020-03-08 (×3): qty 2

## 2020-03-08 MED ORDER — DIPHENOXYLATE-ATROPINE 2.5-0.025 MG/5ML PO LIQD: 5.0000 mL | Freq: Once | ORAL | Status: DC

## 2020-03-08 MED ORDER — DIPHENOXYLATE-ATROPINE 2.5-0.025 MG PO TABS
2.0000 | ORAL_TABLET | Freq: Once | ORAL | Status: DC
  Filled 2020-03-08: qty 2

## 2020-03-08 NOTE — Evaluation (Signed)
Occupational Therapy Evaluation Patient Details Name: Shelia Black MRN: [id number] DOB: [birth year] Today's Date: 03/08/2020    History of Present Illness 87 yo female admitted with diverticulitis, R UE/R LE pain. Hx of hearing loss (hoh even with hearing aids), DDD, COPD, pulm fibrosis-O2 dep, spinal stenosis, DM, chronic pain.   Clinical Impression   Shelia Black is an 87 year old woman who presents with above pertinent medical history. On evaluation patient presents with generalized weakness, decreased activity tolerance, impaired balance and pain resulting in decreased ability to perform baseline mobility and ADLs. Per daughter patient ambulates with walker and typically independent with BADLs with increased time. On evaluation patient min assist for transfers to side of bed, BSC and then to recliner with RW. Patient needing more assistance with ADLs - total assist for LB dressing and toileting. Patient limited by fatigue. Patient found on 2L [place]. [place] removed and patient predominantly maintained between 90-92% but with transfer dropped to 87% but recovered quickly. Patient will benefit from skilled OT services while in hospital to improve deficits and learn compensatory strategies as needed in order to return to PLOF. Therapist recommends short term rehab if patient and family agreeable.      Follow Up Recommendations  SNF;Home health OT    Equipment Recommendations  3 in 1 bedside commode    Recommendations for Other Services       Precautions / Restrictions Precautions Precautions: Fall Precaution Comments: HOH Restrictions Weight Bearing Restrictions: No      Mobility Bed Mobility Overal bed mobility: Needs Assistance Bed Mobility: Supine to Sit     Supine to sit: Min assist;HOB elevated     General bed mobility comments: Min assist for trunk negotiation and scooting forward to transfer to side of bed    Transfers Overall transfer level: Needs  assistance Equipment used: Rolling walker (2 wheeled) Transfers: Sit to/from [facility] Sit to Stand: Min assist;From elevated surface Stand pivot transfers: Min assist       General transfer comment: Min assist to transfer to [facility] with RW. Min assist to stand from [facility] and then for walker management to take steps and back up to recliner.    Balance Overall balance assessment: Needs assistance Sitting-balance support: Feet supported Sitting balance-Leahy Scale: Fair     Standing balance support: During functional activity;Bilateral upper extremity supported Standing balance-Leahy Scale: Poor                             ADL either performed or assessed with clinical judgement   ADL Overall ADL's : Needs assistance/impaired Eating/Feeding: Set up;Sitting   Grooming: Set up;Sitting   Upper Body Bathing: Sitting;Minimal assistance;Set up   Lower Body Bathing: Maximal assistance;Sit to/from stand;Set up   Upper Body Dressing : Moderate assistance;Sitting   Lower Body Dressing: Total assistance;Sit to/from stand   Toilet Transfer: Minimal assistance;BSC;Stand-pivot;RW Armed forces technical officer Details (indicate cue type and reason): [facility] as she reports she needs to have BM Toileting- Clothing Manipulation and Hygiene: Total assistance;Sit to/from stand Toileting - Clothing Manipulation Details (indicate cue type and reason): Total assist for clothing management and wiping - patient reliant on UEs on walker             Vision Patient Visual Report: No change from baseline       Perception     Praxis      Pertinent Vitals/Pain Pain Assessment: Faces Faces Pain Scale: Hurts a  little bit Pain Location: RUE Pain Descriptors / Indicators: Grimacing;Sore Pain Intervention(s): Monitored during session     Hand Dominance     Extremity/Trunk Assessment Upper Extremity Assessment Upper Extremity Assessment: Generalized weakness (Grossly  able to raise arms to shoulder level, deferred MMT due to recent RUE pain and neck pain.)   Lower Extremity Assessment Lower Extremity Assessment: Defer to PT evaluation   Cervical / Trunk Assessment Cervical / Trunk Assessment: Kyphotic   Communication Communication Communication: HOH   Cognition Arousal/Alertness: Awake/alert Behavior During Therapy: WFL for tasks assessed/performed Overall Cognitive Status: Within Functional Limits for tasks assessed                                     General Comments       Exercises     Shoulder Instructions      Home Living Family/patient expects to be discharged to:: Skilled nursing facility Living Arrangements: Children                               Additional Comments: Lives at home with daughter.      Prior Functioning/Environment Level of Independence: Independent with assistive device(s)        Comments: Ambulates with RW. Typically able to perform ADLs with increased time.        OT Problem List: Decreased strength;Decreased activity tolerance;Impaired balance (sitting and/or standing);Decreased knowledge of use of DME or AE;Pain      OT Treatment/Interventions: Self-care/ADL training;Therapeutic exercise;DME and/or AE instruction;Therapeutic activities;Balance training;Patient/family education    OT Goals(Current goals can be found in the care plan section) Acute Rehab OT Goals Patient Stated Goal: to get stronger in order to return home OT Goal Formulation: With family Time For Goal Achievement: 03/22/20 Potential to Achieve Goals: Good  OT Frequency: Min 2X/week   Barriers to D/C:            Co-evaluation              AM-PAC OT "6 Clicks" Daily Activity     Outcome Measure Help from another person eating meals?: A Little Help from another person taking care of personal grooming?: A Little Help from another person toileting, which includes using toliet, bedpan, or  urinal?: Total Help from another person bathing (including washing, rinsing, drying)?: A Lot Help from another person to put on and taking off regular upper body clothing?: A Lot Help from another person to put on and taking off regular lower body clothing?: Total 6 Click Score: 12   End of Session Equipment Utilized During Treatment: Rolling walker Nurse Communication:  (okay to see per RN)  Activity Tolerance: Patient limited by fatigue Patient left: in chair;with call bell/phone within reach;with family/visitor present  OT Visit Diagnosis: Unsteadiness on feet (R26.81);Other abnormalities of gait and mobility (R26.89);Muscle weakness (generalized) (M62.81);Pain                Time: 7322-0254 OT Time Calculation (min): 25 min Charges:  OT General Charges $OT Visit: 1 Visit OT Evaluation $OT Eval Moderate Complexity: 1 Mod OT Treatments $Self Care/Home Management : 8-22 mins  Wilmon Conover, OTR/L [place] [phone] Pager: [place] 03/08/2020, 2:12 PM

## 2020-03-08 NOTE — Progress Notes (Signed)
PROGRESS NOTE    Shelia Black  WJX:[id number] DOB: February 10, 1921 DOA: 02/28/2020 PCP: [name], PA-C    Chief Complaint  Patient presents with  . Urinary Retention  . Rectal Bleeding  . Abdominal Pain    Brief Narrative:  87 year old white female history of lumbar degenerative disc disease, COPD, reflux, IPF on chronic steroids, diverticular disease CKD 3 Prior ischemic colitis and other multiple admissions for diverticulitis 03/12/2018 with bleeding seen by [facility] gastroenterology Hypothyroidism  Represented 02/29/2020 lower abdominal pain bleeding per rectum increasing fatigue and blood in stool Work-up revealed T-max 100.2 blood pressure soft 87/32 lactic acid 1.5 CT abdomen pelvis sigmoid diverticulitis +3.1 cm abscess Started on Cipro Flagyl with normal saline General surgery consulted but not recommending any surgery and have been monitoring Is developed over the course of the past several days significant joint pains which are probably migratory in addition to difficulty swallowing and speech therapy recommended n.p.o. state   Assessment & Plan:   Active Problems:   Diverticulitis of colon with bleeding   Diabetes mellitus type 2 in obese (HCC)   Sepsis ([place])   ILD (interstitial lung disease) ([place])   Pulmonary fibrosis (HCC)   GERD (gastroesophageal reflux disease)   Sigmoid diverticulitis   Abscess  1 acute diverticulitis with diverticular abscess 3.1 cm Patient slowly improving clinically.  Seen in consultation by general surgery and I recommended evaluation by IR for percutaneous drainage.  Patient seen by IR and felt abscess not amenable to percutaneous drainage due to lack of percutaneous window.  Afebrile.  Leukocytosis trending down.  Seen by speech therapy and regular diet/soft diet with thin liquids recommended.  Received a dose of IV Cipro on admission and was on IV Rocephin and IV Flagyl and subsequently transition to oral clindamycin.  Will  discontinue clindamycin and placed on oral Omnicef and oral Flagyl to complete course of antibiotic treatment.  General surgery was following but signed off.  Supportive care.  Follow.  2.  Hypertension Blood pressure borderline with systolic blood pressure in the low 100s.  Discontinue Norvasc.  Follow.  3.?  Aspiration pneumonia Chest x-ray from 03/06/2020 with increased opacities.  Patient also noted to have a leukocytosis.  Speech therapy reassess patient and patient currently on a soft diet with thin liquids.  Was on IV antibiotics transition to oral clindamycin.  Discontinue clindamycin and placed on oral Omnicef and Flagyl.  Patient also noted to have some expiratory wheezing.  Will place on Pulmicort and scheduled duo nebs.  Continue PPI.  4.  Pain right knee, wrist/probable gout Patient noted to have some debilitating joint pain which was not responsive to patch.  Fentanyl patch discontinued and patient on low-dose Dilaudid.  Uric acid obtained at 4.4.  Daughter describing pain as exquisite, migratory, significant pain with bed sheets touching.  Description of patient's pain concerning for gout.  Rheumatoid factor negative.  double-stranded DNA antibody negative.  Patient with clinical improvement after being started on prednisone.  Continue prednisone.  Supportive care.  Follow.  5.  COPD/IPF on chronic steroids uses 2 L nasal cannula at home baseline Continue O2.  Patient has been on steroids at baseline previously.  Continue current regimen of prednisone 20 mg daily.  Continue Robinul as well as Tussionex.  Placed on scheduled duo nebs and Pulmicort due to expiratory wheezing.  6.  Mild AKI superimposed on chronic kidney disease stage II Follow.  7.  Hypokalemia Repleted.  8.  Hypothyroidism Continue Synthroid.  Outpatient follow-up.  9.  Well-controlled diabetes mellitus type 2 Hemoglobin A1c 6.3 (03/01/2020).  CBG 203 this morning.  Elevated CBG likely secondary to steroids.   Placed on Lantus 5 units daily.  Sliding scale insulin.  10.  GERD PPI.   DVT prophylaxis: Heparin Code Status: DNR Family Communication: Updated patient and daughter at bedside. Disposition:   Status is: Inpatient    Dispo: The patient is from: Home              Anticipated d/c is to: SNF with palliative care following              Anticipated d/c date is: 03/10/2020              Patient currently IV antibiotics, not stable for discharge   Difficult to place patient: No       Consultants:   Palliative care: Dr. Rowe Pavy 03/07/2020  Interventional radiology: Dr. Shon Baton 03/01/2020  General surgery: Dr. Harlow Asa 02/29/2020  Procedures:   CT abdomen and pelvis 02/28/2020  Chest x-ray 03/06/2020, 02/28/2020  Plain films of the C-spine to 02/24/2020  Plain films of bilateral knees 03/04/2020  Antimicrobials:   Omnicef 03/08/2020>>>> 03/13/2020  Clindamycin 03/07/2020>>> 03/08/2020  Flagyl 22/2022>>> 03/13/2020  IV Rocephin 02/29/2020>>>> 03/07/2020  IV ciprofloxacin 124 2022x1 dose  IV Flagyl 1/25/ 2022>>>>> 03/07/2020   Subjective: Sitting up in bed.  Hard of hearing.  Daughter at bedside.  Patient states she is feeling better than she did yesterday.  Improvement with joint pain which was exquisitely tender per daughter 24 hours ago.  Patient with some abdominal cramping per daughter. Looking at menu getting ready to order food.  Has just been seen by speech therapy.  Objective: Vitals:   03/06/20 2145 03/07/20 0559 03/07/20 2136 03/08/20 0611  BP: (!) 104/56 106/61 (!) 107/53 106/70  Pulse: 61 62 66 64  Resp: (!) 25 18 16 18   Temp: 97.7 F (36.5 C) (!) 97.2 F (36.2 C) 97.8 F (36.6 C) 97.7 F (36.5 C)  TempSrc: Oral Oral Oral Oral  SpO2: 96% 100% 96% 96%  Weight:      Height:        Intake/Output Summary (Last 24 hours) at 03/08/2020 1208 Last data filed at 03/08/2020 0859 Gross per 24 hour  Intake 2223.63 ml  Output 1400 ml  Net 823.63 ml   Filed Weights   02/28/20  1544  Weight: 61.2 kg    Examination:  General exam: Appears calm and comfortable  Respiratory system: Diffuse expiratory wheezing. Fine crackles.  Fair air movement Cardiovascular system: S1 & S2 heard, RRR. No JVD, murmurs, rubs, gallops or clicks. No pedal edema. Gastrointestinal system: Abdomen is nondistended, soft and nontender. No organomegaly or masses felt. Normal bowel sounds heard. Central nervous system: Alert and oriented. No focal neurological deficits. Extremities: Symmetric 5 x 5 power. Skin: No rashes, lesions or ulcers Psychiatry: Judgement and insight appear normal. Mood & affect appropriate.     Data Reviewed: I have personally reviewed following labs and imaging studies  CBC: Recent Labs  Lab 03/03/20 0543 03/04/20 0629 03/05/20 0555 03/06/20 0610 03/07/20 0516 03/08/20 0542  WBC 10.1 12.3* 13.0* 11.7* 12.6* 10.9*  NEUTROABS 6.1 8.0*  --  7.4 9.4* 8.3*  HGB 10.0* 10.4* 10.7* 10.0* 10.1* 9.9*  HCT 30.8* 32.3* 32.1* 31.0* 31.0* 30.3*  MCV 104.4* 104.5* 100.9* 102.6* 101.6* 101.7*  PLT 380 414* 510* 444* 499* 500*    Basic Metabolic Panel: Recent Labs  Lab 03/04/20 0629 03/05/20 0555 03/06/20 0610 03/07/20  0516 03/08/20 0542  NA 136 135 137 138 138  K 3.4* 3.2* 3.4* 3.6 4.3  CL 101 99 103 104 104  CO2 24 25 25 24 24   GLUCOSE 152* 126* 127* 177* 259*  BUN 8 8 11 14 19   CREATININE 0.67 0.63 0.58 0.62 0.64  CALCIUM 8.2* 8.2* 8.2* 8.5* 8.3*    GFR: Estimated Creatinine Clearance: 35.5 mL/min (by C-G formula based on SCr of 0.64 mg/dL).  Liver Function Tests: Recent Labs  Lab 03/03/20 0543 03/04/20 0629 03/06/20 0610 03/07/20 0516 03/08/20 0542  AST 21 17 13* 16 26  ALT 17 15 11 11 11   ALKPHOS 46 44 42 45 59  BILITOT 0.4 0.5 0.4 0.4 0.5  PROT 5.4* 5.7* 5.3* 5.5* 5.4*  ALBUMIN 2.4* 2.5* 2.3* 2.3* 2.3*    CBG: Recent Labs  Lab 03/07/20 1211 03/07/20 1730 03/07/20 2138 03/08/20 0747 03/08/20 1157  GLUCAP 141* 292* 255* 203*  180*     Recent Results (from the past 240 hour(s))  Culture, blood (routine x 2)     Status: None   Collection Time: 02/28/20  6:52 PM   Specimen: BLOOD  Result Value Ref Range Status   Specimen Description   Final    BLOOD RIGHT ANTECUBITAL Performed at [facility], [place] [address] [place], [place] [address]    Special Requests   Final    BOTTLES DRAWN AEROBIC AND ANAEROBIC Blood Culture adequate volume Performed at [facility] [address], [place], [place] [address]    Culture   Final    NO GROWTH 5 DAYS Performed at [facility], [place] [address], [place], [place] [address]    Report Status 03/04/2020 FINAL  Final  Culture, blood (routine x 2)     Status: None   Collection Time: 02/28/20  7:05 PM   Specimen: BLOOD  Result Value Ref Range Status   Specimen Description   Final    BLOOD RIGHT ANTECUBITAL Performed at [facility] [address] [place], [place] [address]    Special Requests   Final    BOTTLES DRAWN AEROBIC AND ANAEROBIC Blood Culture adequate volume Performed at [facility] [address], [place], [place] [address]    Culture   Final    NO GROWTH 5 DAYS Performed at [facility], [place] [address] [place], [place] [address]    Report Status 03/04/2020 FINAL  Final  SARS Coronavirus 2 by RT PCR (hospital order, performed in [facility] hospital lab) Nasopharyngeal Nasopharyngeal Swab     Status: None   Collection Time: 02/28/20  7:16 PM   Specimen: Nasopharyngeal Swab  Result Value Ref Range Status   SARS Coronavirus 2 NEGATIVE NEGATIVE Final    Comment: (NOTE) SARS-CoV-2 target nucleic acids are NOT DETECTED.  The SARS-CoV-2 RNA is generally detectable in upper and lower respiratory specimens during the acute phase of infection. The lowest concentration of SARS-CoV-2 viral copies this assay can detect is 250 copies / mL. A negative result does not preclude  SARS-CoV-2 infection and should not be used as the sole basis for treatment or other patient management decisions.  A negative result may occur with improper specimen collection / handling, submission of specimen other than nasopharyngeal swab, presence of viral mutation(s) within the areas targeted by this assay, and inadequate number of viral copies (<250 copies / mL). A negative result must be combined with clinical observations, patient history, and epidemiological  information.  Fact Sheet for Patients:   StrictlyIdeas.no  Fact Sheet for Healthcare Providers: BankingDealers.co.za  This test is not yet approved or  cleared by the [place] FDA and has been authorized for detection and/or diagnosis of SARS-CoV-2 by FDA under an Emergency Use Authorization (EUA).  This EUA will remain in effect (meaning this test can be used) for the duration of the COVID-19 declaration under Section 564(b)(1) of the Act, 21 U.S.C. section 360bbb-3(b)(1), unless the authorization is terminated or revoked sooner.  Performed at [facility], [place] [address] [place], [place] [address]          Radiology Studies: DG Cervical Spine 2 or 3 views  Result Date: 03/07/2020 CLINICAL DATA:  Cervical spine pain, worsening progressively. Severe pain limiting evaluation. EXAM: CERVICAL SPINE - 2-3 VIEW COMPARISON:  CT of the cervical spine from January of 2020 FINDINGS: Very limited examination perhaps due to patient discomfort and inability to position the patient in order to obtain adequate C-spine evaluation. Cervical spine imaged only through the midportion of C6 with marked degenerative changes noted particularly at C4-5 and C5-6. Calcification of the nuchal ligament with similar appearance. Visualized prevertebral soft tissues are unremarkable. Slight curvature to the RIGHT on frontal radiograph apparently patient unable to straighten  neck for the evaluation. There are coarse interstitial markings noted at the lung apices also displayed on recent chest radiograph. Examination is severely limited by limitations related to patient positioning. In the setting of worsening cervical spine pain CT may be helpful as standard radiographs may not allow for complete coverage particularly in the lateral view. IMPRESSION: 1. Limited evaluation of the cervical spine perhaps due to patient discomfort and inability to position the patient in order to obtain adequate C-spine evaluation. CT may be helpful for further evaluation of the cervical spine. 2. Multilevel degenerative disease in visualized portions of the spine. 3. Coarse interstitial markings in the lung apices displayed on recent chest radiograph. Electronically Signed   By: Zetta Bills M.D.   On: 03/07/2020 15:54        Scheduled Meds: . Chlorhexidine Gluconate Cloth  6 each Topical Daily  . clindamycin  300 mg Oral Q8H  . diclofenac  1 patch Transdermal BID  . feeding supplement  237 mL Oral BID BM  . glycopyrrolate  2 mg Oral q AM  . heparin  5,000 Units Subcutaneous Q8H  . insulin aspart  0-9 Units Subcutaneous TID WC  . insulin glargine  5 Units Subcutaneous Daily  . levothyroxine  75 mcg Oral Q0600  . multivitamin with minerals  1 tablet Oral Daily  . pantoprazole  40 mg Oral Daily  . polyethylene glycol  17 g Oral Daily  . potassium chloride  40 mEq Oral Daily  . predniSONE  20 mg Oral QAC breakfast  . senna-docusate  1 tablet Oral BID   Continuous Infusions: . sodium chloride 75 mL/hr at 03/07/20 1650     LOS: 8 days    Time spent: 35 minutes    Irine Seal, MD [facility]   To contact the attending provider between 7A-7P or the covering provider during after hours 7P-7A, please log into the web site [email or web address] and access using universal [place] password for that web site. If you do not have the password, please call the hospital  operator.  03/08/2020, 12:08 PM

## 2020-03-08 NOTE — Progress Notes (Signed)
Physical Therapy Treatment Patient Details Name: Shelia Black MRN: [id number] DOB: [birth year] Today's Date: 03/08/2020    History of Present Illness 87 yo female admitted with diverticulitis, R UE/R LE pain. Hx of hearing loss (hoh even with hearing aids), DDD, COPD, pulm fibrosis-O2 dep, spinal stenosis, DM, chronic pain.    PT Comments    Pt OOB in recliner via OT since shortly after 1 pm.  Max c/o neck pain and feeling "tired".  Assisted out of recliner.  General transfer comment: required increased assist this afternoon due to fatigue as pt sat in recliner.  General Gait Details: limited amb distance from recliner to bed with MAX c/o fatigue and B LE weakness/pain.  Assisted back to bed. General bed mobility comments: assisted back to bed and positioned L sidelying.  Also assisted with peri care after small BM.     Follow Up Recommendations  SNF     Equipment Recommendations       Recommendations for Other Services       Precautions / Restrictions Precautions Precautions: Fall Precaution Comments: HOH Restrictions Weight Bearing Restrictions: No    Mobility  Bed Mobility Overal bed mobility: Needs Assistance Bed Mobility: Supine to Sit     Supine to sit: Mod assist;Max assist     General bed mobility comments: assisted back to bed and positioned L sidelying  Transfers Overall transfer level: Needs assistance Equipment used: Rolling walker (2 wheeled) Transfers: Sit to/from [facility] Sit to Stand: Mod assist Stand pivot transfers: Mod assist       General transfer comment: required increased assist this afternoon due to fatigue as pt sat inrecliner  Ambulation/Gait Ambulation/Gait assistance: Mod assist Gait Distance (Feet): 1 Feet Assistive device: Rolling walker (2 wheeled) Gait Pattern/deviations: Step-to pattern Gait velocity: decreased   General Gait Details: limited amb distance from recliner to bed with MAX c/o fatigue and B  LE weakness/pain   Stairs             Wheelchair Mobility    Modified Rankin (Stroke Patients Only)       Balance Overall balance assessment: Needs assistance Sitting-balance support: Feet supported Sitting balance-Leahy Scale: Fair     Standing balance support: During functional activity;Bilateral upper extremity supported Standing balance-Leahy Scale: Poor                              Cognition Arousal/Alertness: Awake/alert Behavior During Therapy: WFL for tasks assessed/performed Overall Cognitive Status: Within Functional Limits for tasks assessed                                 General Comments: AxO x 2 pleasant very HOH      Exercises      General Comments        Pertinent Vitals/Pain Pain Assessment: Faces Faces Pain Scale: Hurts a little bit Pain Location: neck Pain Descriptors / Indicators: Grimacing;Sore;Aching Pain Intervention(s): Monitored during session;Repositioned    Home Living Family/patient expects to be discharged to:: Skilled nursing facility Living Arrangements: Children             Additional Comments: Lives at home with daughter.    Prior Function Level of Independence: Independent with assistive device(s)      Comments: Ambulates with RW. Typically able to perform ADLs with increased time.   PT Goals (current goals can now be found in the  care plan section) Acute Rehab PT Goals Patient Stated Goal: to get stronger in order to return home Progress towards PT goals: Progressing toward goals    Frequency    Min 3X/week      PT Plan Current plan remains appropriate    Co-evaluation              AM-PAC PT "6 Clicks" Mobility   Outcome Measure  Help needed turning from your back to your side while in a flat bed without using bedrails?: Total Help needed moving from lying on your back to sitting on the side of a flat bed without using bedrails?: Total Help needed moving to and  from a bed to a chair (including a wheelchair)?: Total Help needed standing up from a chair using your arms (e.g., wheelchair or bedside chair)?: Total Help needed to walk in hospital room?: Total Help needed climbing 3-5 steps with a railing? : Total 6 Click Score: 6    End of Session Equipment Utilized During Treatment: Oxygen Activity Tolerance: Patient limited by pain;Patient limited by fatigue Patient left: in bed;with call bell/phone within reach;with bed alarm set;with family/visitor present Nurse Communication: Mobility status PT Visit Diagnosis: Pain;Muscle weakness (generalized) (M62.81) Pain - Right/Left: Right     Time: 1594-7076 PT Time Calculation (min) (ACUTE ONLY): 26 min  Charges:  $Gait Training: 8-22 mins $Therapeutic Activity: 8-22 mins                     Shelia Black  PTA Acute  Rehabilitation Services Pager      [phone] Office      [phone]

## 2020-03-08 NOTE — Progress Notes (Signed)
  Speech Language Pathology Treatment: Dysphagia  Patient Details Name: Shelia Black MRN: [id number] DOB: 08-31-1921 Today's Date: 03/08/2020 Time: 0277-4128 SLP Time Calculation (min) (ACUTE ONLY): 29 min  Assessment / Plan / Recommendation Clinical Impression  Pt naturally takes very small bites/sips independently without cues; Mentation is much improved - as well as swallow function.  Recommend advance diet to regular/thin to prevent restrictions. Pt informed SLP that she prefers softer foods to start.  No indication of aspiration with all po observed including entire container of Ensure, vanilla icecream and gingerale.  Her articulation ability is intact and thus recommend solids as tolerate.  NO SLP follow up indicated and pt's acute dysphagia has resolved. SLP Informed pt to recommendations, alternatives for medication administration if needed and importance of self feeding using teach back.  No SLP follow up needed.    HPI HPI: Patient is a 87 y.o. female with PMH: DM, GERD, COPD, pulmonary fibrosis not on home oxygen, diverticulosis, who presented to hospital with abdominal pain, difficulty urinating and noticing some blood in her stool. Daughter reported at time of admission that patient was not doing well for past week leading up to this hospitalization with increasing fatigue. CXR revealed Multifocal pneumonia with worsened retrocardiac opacity, likely bilateral trace pleural effusions.  Pt seen for follow up dysphagia treatment.  She is s/p palliative meeting and plans for comfort intake.  SLP Informed pt to recommendations, alternatives for medication administration if needed and importance of self feeding using teach back.      SLP Plan  All goals met       Recommendations  Diet recommendations: Regular;Thin liquid (soft foods) Liquids provided via: Straw;Cup Medication Administration: Other (Comment) (as tolerate, with puree if problematic) Supervision: Patient able to  self feed Compensations: Minimize environmental distractions;Slow rate;Small sips/bites Postural Changes and/or Swallow Maneuvers: Upright 30-60 min after meal;Seated upright 90 degrees                Oral Care Recommendations: Oral care BID;Staff/trained caregiver to provide oral care Follow up Recommendations: None Plan: All goals met       GO                [name] 03/08/2020, 10:51 AM   [name], MS [facility] SLP [place] Office [phone] Pager [phone]

## 2020-03-09 ENCOUNTER — Inpatient Hospital Stay (HOSPITAL_COMMUNITY): Payer: Medicare Other

## 2020-03-09 LAB — CBC WITH DIFFERENTIAL/PLATELET
Abs Immature Granulocytes: 0.69 10*3/uL — ABNORMAL HIGH (ref 0.00–0.07)
Basophils Absolute: 0.1 10*3/uL (ref 0.0–0.1)
Basophils Relative: 1 %
Eosinophils Absolute: 0 10*3/uL (ref 0.0–0.5)
Eosinophils Relative: 0 %
HCT: 30.5 % — ABNORMAL LOW (ref 36.0–46.0)
Hemoglobin: 9.9 g/dL — ABNORMAL LOW (ref 12.0–15.0)
Immature Granulocytes: 6 %
Lymphocytes Relative: 18 %
Lymphs Abs: 2 10*3/uL (ref 0.7–4.0)
MCH: 32.7 pg (ref 26.0–34.0)
MCHC: 32.5 g/dL (ref 30.0–36.0)
MCV: 100.7 fL — ABNORMAL HIGH (ref 80.0–100.0)
Monocytes Absolute: 1.1 10*3/uL — ABNORMAL HIGH (ref 0.1–1.0)
Monocytes Relative: 10 %
Neutro Abs: 7.2 10*3/uL (ref 1.7–7.7)
Neutrophils Relative %: 65 %
Platelets: 454 10*3/uL — ABNORMAL HIGH (ref 150–400)
RBC: 3.03 MIL/uL — ABNORMAL LOW (ref 3.87–5.11)
RDW: 14.8 % (ref 11.5–15.5)
WBC: 11.1 10*3/uL — ABNORMAL HIGH (ref 4.0–10.5)
nRBC: 0 % (ref 0.0–0.2)

## 2020-03-09 LAB — GLUCOSE, CAPILLARY
Glucose-Capillary: 148 mg/dL — ABNORMAL HIGH (ref 70–99)
Glucose-Capillary: 139 mg/dL — ABNORMAL HIGH (ref 70–99)
Glucose-Capillary: 287 mg/dL — ABNORMAL HIGH (ref 70–99)

## 2020-03-09 LAB — BASIC METABOLIC PANEL
Anion gap: 9 (ref 5–15)
BUN: 20 mg/dL (ref 8–23)
CO2: 24 mmol/L (ref 22–32)
Calcium: 8.3 mg/dL — ABNORMAL LOW (ref 8.9–10.3)
Chloride: 104 mmol/L (ref 98–111)
Creatinine, Ser: 0.72 mg/dL (ref 0.44–1.00)
GFR, Estimated: >60 mL/min (ref >60)
Glucose, Bld: 220 mg/dL — ABNORMAL HIGH (ref 70–99)
Potassium: 3.9 mmol/L (ref 3.5–5.1)
Sodium: 137 mmol/L (ref 135–145)

## 2020-03-09 LAB — SARS CORONAVIRUS 2 (TAT 6-24 HRS): SARS Coronavirus 2: NEGATIVE

## 2020-03-09 LAB — MAGNESIUM: Magnesium: 1.7 mg/dL (ref 1.7–2.4)

## 2020-03-09 MED ORDER — MAGNESIUM SULFATE 2 GM/50ML IV SOLN
2.0000 g | Freq: Once | INTRAVENOUS | Status: AC
  Administered 2020-03-09: 2 g via INTRAVENOUS
  Filled 2020-03-09: qty 50

## 2020-03-09 MED ORDER — IPRATROPIUM-ALBUTEROL 0.5-2.5 (3) MG/3ML IN SOLN
3.0000 mL | Freq: Two times a day (BID) | RESPIRATORY_TRACT | Status: DC
  Filled 2020-03-09 (×2): qty 3

## 2020-03-09 NOTE — Progress Notes (Signed)
PROGRESS NOTE    Shelia Black  AUQ:[id number] DOB: May 16, 1921 DOA: 02/28/2020 PCP: [name], PA-C    Chief Complaint  Patient presents with  . Urinary Retention  . Rectal Bleeding  . Abdominal Pain    Brief Narrative:  87 year old white female history of lumbar degenerative disc disease, COPD, reflux, IPF on chronic steroids, diverticular disease CKD 3 Prior ischemic colitis and other multiple admissions for diverticulitis 03/12/2018 with bleeding seen by [facility] gastroenterology Hypothyroidism  Represented 02/29/2020 lower abdominal pain bleeding per rectum increasing fatigue and blood in stool Work-up revealed T-max 100.2 blood pressure soft 87/32 lactic acid 1.5 CT abdomen pelvis sigmoid diverticulitis +3.1 cm abscess Started on Cipro Flagyl with normal saline General surgery consulted but not recommending any surgery and have been monitoring Is developed over the course of the past several days significant joint pains which are probably migratory in addition to difficulty swallowing and speech therapy recommended n.p.o. state   Assessment & Plan:   Active Problems:   Diverticulitis of colon with bleeding   Diabetes mellitus type 2 in obese (HCC)   Sepsis ([place])   ILD (interstitial lung disease) ([place])   Pulmonary fibrosis (HCC)   GERD (gastroesophageal reflux disease)   Sigmoid diverticulitis   Abscess   Acute diverticulitis   Diverticulitis of intestine with abscess   PNA (pneumonia)   Acute gout of knee  1 acute diverticulitis with diverticular abscess 3.1 cm Patient slowly improving clinically.  Seen in consultation by general surgery and I recommended evaluation by IR for percutaneous drainage.  Patient seen by IR and felt abscess not amenable to percutaneous drainage due to lack of percutaneous window.  Afebrile.  Leukocytosis trending down.  Seen by speech therapy and regular diet/soft diet with thin liquids recommended.  Received a dose of IV Cipro  on admission and was on IV Rocephin and IV Flagyl and subsequently transitioned to oral clindamycin.  Clindamycin discontinued and patient placed on oral Omnicef and oral Flagyl to complete a full course of antibiotic treatment.  General surgery was following but have signed off.  Supportive care.  Follow.   2.  Hypertension Blood pressure borderline with systolic blood pressure in the low 100s.  Discontinued Norvasc and likely will not resume on discharge.  Follow.  3.?  Aspiration pneumonia Chest x-ray from 03/06/2020 with increased opacities.  Patient also noted to have a leukocytosis.  Speech therapy reassess patient and patient currently on a soft diet with thin liquids.  Was on IV antibiotics transitioned to oral clindamycin.  Discontinued clindamycin and placed on oral Omnicef and Flagyl.  Patient also noted to have some expiratory wheezing which improved with nebulizer treatments.  Continue PPI.  Follow..   4.  Pain right knee, wrist/probable gout Patient noted to have some debilitating joint pain which was not responsive to patch.  Fentanyl patch discontinued and patient on low-dose Dilaudid.  Uric acid obtained at 4.4.  Daughter describing pain as exquisite, migratory, significant pain when bed sheets touching.  Description of patient's pain concerning for gout.  Rheumatoid factor negative.  double-stranded DNA antibody negative.  Improving clinically on short course of oral prednisone.  Supportive care.  Follow.    5.  COPD/IPF on chronic steroids uses 2 L nasal cannula at home baseline Continue O2.  Patient has been on steroids at baseline previously.  Continue current regimen of prednisone 20 mg daily times total of 5 days.  Continue Robinul as well as Tussionex.  Continue scheduled duo nebs and Pulmicort.  Check O2 on room air.  6.  Mild AKI superimposed on chronic kidney disease stage II Follow.  7.  Hypokalemia Repleted.  Potassium at 3.9.  Magnesium at 1.7.  We will give magnesium  sulfate 2 g IV x1.  Follow.   8.  Hypothyroidism Synthroid.  Outpatient follow-up.   9.  Well-controlled diabetes mellitus type 2 Hemoglobin A1c 6.3 (03/01/2020).  CBG 148 this morning.  Elevated CBG likely secondary to steroids.  Continue Lantus 5 units daily.  Sliding scale insulin.   10.  GERD Continue PPI.   DVT prophylaxis: Heparin Code Status: DNR Family Communication: Updated patient and daughter at bedside. Disposition:   Status is: Inpatient    Dispo: The patient is from: Home              Anticipated d/c is to: SNF with palliative care following              Anticipated d/c date is: 03/10/2020              Patient currently on antibiotics.  Not stable for discharge today.     Difficult to place patient: No       Consultants:   Palliative care: Dr. Rowe Pavy 03/07/2020  Interventional radiology: Dr. Shon Baton 03/01/2020  General surgery: Dr. Harlow Asa 02/29/2020  Procedures:   CT abdomen and pelvis 02/28/2020  Chest x-ray 03/06/2020, 02/28/2020  Plain films of the C-spine to 02/24/2020  Plain films of bilateral knees 03/04/2020  Antimicrobials:   Omnicef 03/08/2020>>>> 03/13/2020  Clindamycin 03/07/2020>>> 03/08/2020  Flagyl 22/2022>>> 03/13/2020  IV Rocephin 02/29/2020>>>> 03/07/2020  IV ciprofloxacin 124 2022x1 dose  IV Flagyl 1/25/ 2022>>>>> 03/07/2020   Subjective: Sitting up in bed.  States just not feeling well.  Patient seems to be frustrated admitting the hospital this long.  Denies any significant shortness of breath.  No significant chest pain.  Daughter at bedside.  Patient states some improvement with joint pain.  Objective: Vitals:   03/08/20 1420 03/08/20 1948 03/08/20 2136 03/09/20 0557  BP:   (!) 113/54 (!) 102/52  Pulse:   72 62  Resp:   18 18  Temp:   98 F (36.7 C) 98 F (36.7 C)  TempSrc:   Oral Oral  SpO2: 95% 96% 98% 98%  Weight:      Height:        Intake/Output Summary (Last 24 hours) at 03/09/2020 1205 Last data filed at 03/09/2020  1113 Gross per 24 hour  Intake 956.44 ml  Output 3350 ml  Net -2393.56 ml   Filed Weights   02/28/20 1544  Weight: 61.2 kg    Examination:  General exam: NAD Respiratory system: Fine diffuse crackles.  No wheezing noted.  No rhonchi.  Fair air movement.   Cardiovascular system: Regular rate rhythm no murmurs rubs or gallops.  No JVD.  No lower extremity edema. Gastrointestinal system: Abdomen is soft, nontender, nondistended, positive bowel sounds.  No rebound.  No guarding. Central nervous system: Alert and oriented. No focal neurological deficits. Extremities: Symmetric 5 x 5 power. Skin: No rashes, lesions or ulcers Psychiatry: Judgement and insight appear normal. Mood & affect appropriate.     Data Reviewed: I have personally reviewed following labs and imaging studies  CBC: Recent Labs  Lab 03/04/20 0629 03/05/20 0555 03/06/20 0610 03/07/20 0516 03/08/20 0542 03/09/20 0454  WBC 12.3* 13.0* 11.7* 12.6* 10.9* 11.1*  NEUTROABS 8.0*  --  7.4 9.4* 8.3* 7.2  HGB 10.4* 10.7* 10.0* 10.1* 9.9* 9.9*  HCT 32.3* 32.1* 31.0* 31.0* 30.3* 30.5*  MCV 104.5* 100.9* 102.6* 101.6* 101.7* 100.7*  PLT 414* 510* 444* 499* 500* 454*    Basic Metabolic Panel: Recent Labs  Lab 03/05/20 0555 03/06/20 0610 03/07/20 0516 03/08/20 0542 03/09/20 0454  NA 135 137 138 138 137  K 3.2* 3.4* 3.6 4.3 3.9  CL 99 103 104 104 104  CO2 25 25 24 24 24   GLUCOSE 126* 127* 177* 259* 220*  BUN 8 11 14 19 20   CREATININE 0.63 0.58 0.62 0.64 0.72  CALCIUM 8.2* 8.2* 8.5* 8.3* 8.3*  MG  --   --   --   --  1.7    GFR: Estimated Creatinine Clearance: 35.5 mL/min (by C-G formula based on SCr of 0.72 mg/dL).  Liver Function Tests: Recent Labs  Lab 03/03/20 0543 03/04/20 0629 03/06/20 0610 03/07/20 0516 03/08/20 0542  AST 21 17 13* 16 26  ALT 17 15 11 11 11   ALKPHOS 46 44 42 45 59  BILITOT 0.4 0.5 0.4 0.4 0.5  PROT 5.4* 5.7* 5.3* 5.5* 5.4*  ALBUMIN 2.4* 2.5* 2.3* 2.3* 2.3*     CBG: Recent Labs  Lab 03/08/20 1157 03/08/20 1626 03/08/20 2142 03/09/20 0737 03/09/20 1151  GLUCAP 180* 256* 287* 148* 139*     Recent Results (from the past 240 hour(s))  Culture, blood (routine x 2)     Status: None   Collection Time: 02/28/20  6:52 PM   Specimen: BLOOD  Result Value Ref Range Status   Specimen Description   Final    BLOOD RIGHT ANTECUBITAL Performed at [facility], [place] [address], [place], [place] [address]    Special Requests   Final    BOTTLES DRAWN AEROBIC AND ANAEROBIC Blood Culture adequate volume Performed at [place] [address] [place], [place] [address]    Culture   Final    NO GROWTH 5 DAYS Performed at [facility], [place] [address] [place], [place] [address]    Report Status 03/04/2020 FINAL  Final  Culture, blood (routine x 2)     Status: None   Collection Time: 02/28/20  7:05 PM   Specimen: BLOOD  Result Value Ref Range Status   Specimen Description   Final    BLOOD RIGHT ANTECUBITAL Performed at [facility] [address], [place], [place] [address]    Special Requests   Final    BOTTLES DRAWN AEROBIC AND ANAEROBIC Blood Culture adequate volume Performed at [facility] [address], [place], [place] [address]    Culture   Final    NO GROWTH 5 DAYS Performed at [facility], [place] [address], [place], [place] [address]    Report Status 03/04/2020 FINAL  Final  SARS Coronavirus 2 by RT PCR (hospital order, performed in [facility] hospital lab) Nasopharyngeal Nasopharyngeal Swab     Status: None   Collection Time: 02/28/20  7:16 PM   Specimen: Nasopharyngeal Swab  Result Value Ref Range Status   SARS Coronavirus 2 NEGATIVE NEGATIVE Final    Comment: (NOTE) SARS-CoV-2 target nucleic acids are NOT DETECTED.  The SARS-CoV-2 RNA is generally detectable in upper and lower respiratory specimens during the acute phase of  infection. The lowest concentration of SARS-CoV-2 viral copies this assay can detect is 250 copies / mL. A negative result does not preclude SARS-CoV-2 infection and should not be used as the sole basis for treatment or other patient  management decisions.  A negative result may occur with improper specimen collection / handling, submission of specimen other than nasopharyngeal swab, presence of viral mutation(s) within the areas targeted by this assay, and inadequate number of viral copies (<250 copies / mL). A negative result must be combined with clinical observations, patient history, and epidemiological information.  Fact Sheet for Patients:   StrictlyIdeas.no  Fact Sheet for Healthcare Providers: BankingDealers.co.za  This test is not yet approved or  cleared by the [place] FDA and has been authorized for detection and/or diagnosis of SARS-CoV-2 by FDA under an Emergency Use Authorization (EUA).  This EUA will remain in effect (meaning this test can be used) for the duration of the COVID-19 declaration under Section 564(b)(1) of the Act, 21 U.S.C. section 360bbb-3(b)(1), unless the authorization is terminated or revoked sooner.  Performed at [facility], [place] [address] [place], [place] [address]          Radiology Studies: DG Cervical Spine 2 or 3 views  Result Date: 03/07/2020 CLINICAL DATA:  Cervical spine pain, worsening progressively. Severe pain limiting evaluation. EXAM: CERVICAL SPINE - 2-3 VIEW COMPARISON:  CT of the cervical spine from January of 2020 FINDINGS: Very limited examination perhaps due to patient discomfort and inability to position the patient in order to obtain adequate C-spine evaluation. Cervical spine imaged only through the midportion of C6 with marked degenerative changes noted particularly at C4-5 and C5-6. Calcification of the nuchal ligament with similar appearance.  Visualized prevertebral soft tissues are unremarkable. Slight curvature to the RIGHT on frontal radiograph apparently patient unable to straighten neck for the evaluation. There are coarse interstitial markings noted at the lung apices also displayed on recent chest radiograph. Examination is severely limited by limitations related to patient positioning. In the setting of worsening cervical spine pain CT may be helpful as standard radiographs may not allow for complete coverage particularly in the lateral view. IMPRESSION: 1. Limited evaluation of the cervical spine perhaps due to patient discomfort and inability to position the patient in order to obtain adequate C-spine evaluation. CT may be helpful for further evaluation of the cervical spine. 2. Multilevel degenerative disease in visualized portions of the spine. 3. Coarse interstitial markings in the lung apices displayed on recent chest radiograph. Electronically Signed   By: Zetta Bills M.D.   On: 03/07/2020 15:54        Scheduled Meds: . budesonide (PULMICORT) nebulizer solution  0.5 mg Nebulization BID  . cefdinir  300 mg Oral Q12H  . Chlorhexidine Gluconate Cloth  6 each Topical Daily  . diclofenac  1 patch Transdermal BID  . feeding supplement  237 mL Oral BID BM  . glycopyrrolate  2 mg Oral q AM  . heparin  5,000 Units Subcutaneous Q8H  . insulin aspart  0-9 Units Subcutaneous TID WC  . insulin glargine  5 Units Subcutaneous Daily  . ipratropium-albuterol  3 mL Nebulization BID  . levothyroxine  75 mcg Oral Q0600  . metroNIDAZOLE  500 mg Oral Q8H  . multivitamin with minerals  1 tablet Oral Daily  . pantoprazole  40 mg Oral Daily  . polyethylene glycol  17 g Oral Daily  . potassium chloride  40 mEq Oral Daily  . predniSONE  20 mg Oral QAC breakfast  . senna-docusate  1 tablet Oral BID   Continuous Infusions:    LOS: 9 days    Time spent: 35 minutes    Irine Seal, MD [facility]  To contact the  attending provider between 7A-7P or the covering provider during after hours 7P-7A, please log into the web site [email or web address] and access using universal [place] password for that web site. If you do not have the password, please call the hospital operator.  03/09/2020, 12:05 PM

## 2020-03-09 NOTE — Progress Notes (Signed)
Daily Progress Note   Patient Name: Shelia Black       Date: 03/09/2020 DOB: 1921/07/15  Age: 87 y.o. MRN#: [id number] Attending Physician: Eugenie Filler, MD Primary Care Physician: [name], PA-C Admit Date: 02/28/2020  Reason for Consultation/Follow-up: Establishing goals of care  Subjective:  awake alert Sitting up in bed  Has mild cough, no distress, daughter at bedside.  Length of Stay: 9  Current Medications: Scheduled Meds:  . budesonide (PULMICORT) nebulizer solution  0.5 mg Nebulization BID  . cefdinir  300 mg Oral Q12H  . Chlorhexidine Gluconate Cloth  6 each Topical Daily  . diclofenac  1 patch Transdermal BID  . feeding supplement  237 mL Oral BID BM  . glycopyrrolate  2 mg Oral q AM  . heparin  5,000 Units Subcutaneous Q8H  . insulin aspart  0-9 Units Subcutaneous TID WC  . insulin glargine  5 Units Subcutaneous Daily  . ipratropium-albuterol  3 mL Nebulization BID  . levothyroxine  75 mcg Oral Q0600  . metroNIDAZOLE  500 mg Oral Q8H  . multivitamin with minerals  1 tablet Oral Daily  . pantoprazole  40 mg Oral Daily  . polyethylene glycol  17 g Oral Daily  . potassium chloride  40 mEq Oral Daily  . predniSONE  20 mg Oral QAC breakfast  . senna-docusate  1 tablet Oral BID    Continuous Infusions:   PRN Meds: acetaminophen **OR** acetaminophen, albuterol, albuterol, bisacodyl, chlorpheniramine-HYDROcodone, HYDROmorphone, naphazoline-glycerin, ondansetron **OR** ondansetron (ZOFRAN) IV  Physical Exam         Sitting up in chair No distress No edema Awake alert  Vital Signs: BP 119/67 (BP Location: Left Arm)   Pulse 66   Temp 98 F (36.7 C) (Oral)   Resp 16   Ht 5' 4"  (1.626 m)   Wt 69.6 kg   LMP  (LMP Unknown)   SpO2 95%   BMI  26.34 kg/m  SpO2: SpO2: 95 % O2 Device: O2 Device: Room Air O2 Flow Rate: O2 Flow Rate (L/min): 2 L/min  Intake/output summary:   Intake/Output Summary (Last 24 hours) at 03/09/2020 1527 Last data filed at 03/09/2020 1430 Gross per 24 hour  Intake 659.08 ml  Output 3950 ml  Net -3290.92 ml   LBM: Last BM Date: 03/08/20 Baseline Weight: Weight: 61.2  kg Most recent weight: Weight: 69.6 kg       Palliative Assessment/Data:      Patient Active Problem List   Diagnosis Date Noted  . Acute diverticulitis   . Diverticulitis of intestine with abscess   . PNA (pneumonia)   . Acute gout of knee   . Abscess 03/02/2020  . Sigmoid diverticulitis 02/29/2020  . Atypical chest pain 01/14/2019  . Hypoxia 10/05/2018  . Back pain, lumbosacral 10/05/2018  . Back pain 10/05/2018  . Ischemic colitis ([place]) 03/14/2018  . Colitis 03/13/2018  . Colitis with rectal bleeding 03/12/2018  . Sepsis ([place]) 05/23/2016  . Diverticulitis 05/23/2016  . GERD (gastroesophageal reflux disease) 05/23/2016  . Hypothyroidism 05/23/2016  . Depression 05/23/2016  . ILD (interstitial lung disease) ([place])   . Pulmonary fibrosis ([place])   . Acute UTI   . UTI (lower urinary tract infection)   . Hyperglycemia 02/20/2015  . Diabetes mellitus type 2 in obese ([place]) 02/20/2015  . CKD (chronic kidney disease) stage 3, GFR 30-59 ml/min ([facility]) 02/20/2015  . UTI (urinary tract infection) 04/29/2014  . Diverticulitis of colon with bleeding 04/29/2014  . Acute respiratory failure with hypoxia ([place]) 04/29/2014  . Acute renal failure ([place]) 04/29/2014  . Hyperkalemia 04/29/2014  . Nausea and vomiting 04/29/2014    Palliative Care Assessment & Plan   Patient Profile:    Assessment: 87 year old lady living at home with her daughter, past medical history of degenerative disc disease COPD idiopathic pulmonary fibrosis on chronic steroids admitted with diverticular disease, possible diverticular abscess.  Palliative care  consulted for goals of care discussions.  Recommendations/Plan:  Advance care directives were reviewed at the time of initial consultation.  Patient had completed healthcare power of attorney agent as well as living will paperwork to decades ago.  This was reviewed with the patient, she elected for DNR/DNI and she elected for her daughter to be her designated healthcare power of attorney agent.    recommend palliative care consultation at [place] upon discharge.  Continue current mode of care.  No palliative care specific recommendations at this time.    Code Status:    Code Status Orders  (From admission, onward)         Start     Ordered   03/07/20 1743  Do not attempt resuscitation (DNR)  Continuous       Question Answer Comment  In the event of cardiac or respiratory ARREST Do not call a "code blue"   In the event of cardiac or respiratory ARREST Do not perform Intubation, CPR, defibrillation or ACLS   In the event of cardiac or respiratory ARREST Use medication by any route, position, wound care, and other measures to relive pain and suffering. May use oxygen, suction and manual treatment of airway obstruction as needed for comfort.      03/07/20 1742        Code Status History    Date Active Date Inactive Code Status Order ID Comments User Context   02/29/2020 2127 03/07/2020 1742 Full Code [id number]  Mikki Harbor, MD ED   10/05/2018 0737 10/08/2018 1818 Full Code [id number]  Elwyn Reach, MD Inpatient   03/13/2018 0141 03/15/2018 1723 Full Code [id number]  Rise Patience, MD Inpatient   05/23/2016 0304 05/27/2016 2032 Full Code [id number]  Ivor Costa, MD ED   02/20/2015 2320 02/23/2015 1426 Full Code [id number]  Vianne Bulls, MD ED   04/29/2014 1157 05/01/2014 1700 Full Code [id number]  Robbie Lis, MD ED   Advance Care Planning Activity    Advance Directive Documentation   Flowsheet Row Most Recent Value  Type of Advance Directive  Healthcare Power of Attorney  Pre-existing out of facility DNR order (yellow form or pink MOST form) --  "MOST" Form in Place? --       Prognosis:   Unable to determine  Discharge Planning:  [place] for rehab with Palliative care service follow-up  Care plan was discussed with  Patient, daughter.   Thank you for allowing the Palliative Medicine Team to assist in the care of this patient.   Time In: 1500 Time Out: 1525 Total Time 25 Prolonged Time Billed No.       Greater than 50%  of this time was spent counseling and coordinating care related to the above assessment and plan.  Loistine Chance, MD  Please contact Palliative Medicine Team phone at [phone] for questions and concerns.

## 2020-03-09 NOTE — TOC Progression Note (Signed)
Transition of Care [facility]) - Progression Note    Patient Details  Name: Shelia Black MRN: [id number] Date of Birth: 1921/05/01  [facility]) CM/SW [place], LCSW Phone Number: 03/09/2020, 10:09 AM  Clinical Narrative:    Daughter provided with a list of SNF bed offers. She chose [facility]. Facility will have a bed 2/4 pending a negative covid test.   Expected Discharge Plan: [place] Barriers to Discharge: Continued Medical Work up  Expected Discharge Plan and Services Expected Discharge Plan: [place] In-house Referral: Clinical Social Work   Post Acute Care Choice: Durable Medical Equipment Living arrangements for the past 2 months: Single Family Home                                       Social Determinants of Health (SDOH) Interventions    Readmission Risk Interventions No flowsheet data found.

## 2020-03-09 NOTE — Progress Notes (Signed)
Nutrition Follow-up  INTERVENTION:   -Ensure Enlive po BID, each supplement provides 350 kcal and 20 grams of protein  -Multivitamin with minerals daily  NUTRITION DIAGNOSIS:   Increased nutrient needs related to acute illness as evidenced by estimated needs.  Ongoing.  GOAL:   Patient will meet greater than or equal to 90% of their needs  MONITOR:   PO intake,Supplement acceptance,Labs,Weight trends,I & O's  ASSESSMENT:   87 year old white female history of degenerative disc disease, COPD, reflux, IPF on chronic steroids, diverticular disease CKD 3  Prior ischemic colitis and other multiple admissions for diverticulitis 03/12/2018 with bleeding. Admitted for likely diverticular abscess.  Patient currently consuming 0-50% of meals. Pt has been drinking Ensure and some protein shakes that her daughter has brought.   Per chart review, plan is for pt to discharge 2/4 to SNF pending COVID test.  Admission weight: 135 lbs. No new weight since admission.  Medications: Multivitamin with minerals daily, Miralax, KLOR-CON, Senokot, IV Mg sulfate  Labs reviewed: CBGs: 139-148  Diet Order:   Diet Order            Diet regular Room service appropriate? Yes; Fluid consistency: Thin  Diet effective now                 EDUCATION NEEDS:   No education needs have been identified at this time  Skin:  Skin Assessment: Reviewed RN Assessment  Last BM:  1/27  Height:   Ht Readings from Last 1 Encounters:  02/28/20 5' 4"  (1.626 m)    Weight:   Wt Readings from Last 1 Encounters:  02/28/20 61.2 kg   BMI:  Body mass index is 23.17 kg/m.  Estimated Nutritional Needs:   Kcal:  1550-1750  Protein:  70-80g  Fluid:  1.5L/day  Clayton Bibles, MS, RD, LDN Inpatient Clinical Dietitian Contact information available via Amion

## 2020-03-10 ENCOUNTER — Other Ambulatory Visit: Payer: Self-pay | Admitting: Internal Medicine

## 2020-03-10 DIAGNOSIS — K5780 Diverticulitis of intestine, part unspecified, with perforation and abscess without bleeding: Secondary | ICD-10-CM

## 2020-03-10 DIAGNOSIS — J690 Pneumonitis due to inhalation of food and vomit: Secondary | ICD-10-CM

## 2020-03-10 LAB — BASIC METABOLIC PANEL
Anion gap: 11 (ref 5–15)
BUN: 16 mg/dL (ref 8–23)
CO2: 27 mmol/L (ref 22–32)
Calcium: 8.7 mg/dL — ABNORMAL LOW (ref 8.9–10.3)
Chloride: 101 mmol/L (ref 98–111)
Creatinine, Ser: 0.71 mg/dL (ref 0.44–1.00)
GFR, Estimated: >60 mL/min (ref >60)
Glucose, Bld: 149 mg/dL — ABNORMAL HIGH (ref 70–99)
Potassium: 4.9 mmol/L (ref 3.5–5.1)
Sodium: 139 mmol/L (ref 135–145)

## 2020-03-10 LAB — GLUCOSE, CAPILLARY
Glucose-Capillary: 119 mg/dL — ABNORMAL HIGH (ref 70–99)
Glucose-Capillary: 169 mg/dL — ABNORMAL HIGH (ref 70–99)
Glucose-Capillary: 280 mg/dL — ABNORMAL HIGH (ref 70–99)

## 2020-03-10 LAB — MAGNESIUM: Magnesium: 2.1 mg/dL (ref 1.7–2.4)

## 2020-03-10 MED ORDER — IPRATROPIUM-ALBUTEROL 0.5-2.5 (3) MG/3ML IN SOLN: 3.0000 mL | Freq: Two times a day (BID) | RESPIRATORY_TRACT | 0 refills | Status: DC

## 2020-03-10 MED ORDER — SENNOSIDES-DOCUSATE SODIUM 8.6-50 MG PO TABS: 1.0000 | ORAL_TABLET | Freq: Two times a day (BID) | ORAL | Status: AC

## 2020-03-10 MED ORDER — METRONIDAZOLE 500 MG PO TABS: 500.0000 mg | ORAL_TABLET | Freq: Three times a day (TID) | ORAL | 0 refills | Status: AC

## 2020-03-10 MED ORDER — HYDROMORPHONE HCL 2 MG PO TABS: 1.0000 mg | ORAL_TABLET | ORAL | 0 refills | Status: DC | PRN

## 2020-03-10 MED ORDER — DICLOFENAC EPOLAMINE 1.3 % EX PTCH: 1.0000 | MEDICATED_PATCH | Freq: Two times a day (BID) | CUTANEOUS | 0 refills | Status: DC

## 2020-03-10 MED ORDER — HYDROCOD POLST-CPM POLST ER 10-8 MG/5ML PO SUER: 5.0000 mL | Freq: Two times a day (BID) | ORAL | 0 refills | Status: DC | PRN

## 2020-03-10 MED ORDER — POLYETHYLENE GLYCOL 3350 17 G PO PACK: 17.0000 g | PACK | Freq: Every day | ORAL | 0 refills | Status: AC

## 2020-03-10 MED ORDER — DIPHENOXYLATE-ATROPINE 2.5-0.025 MG PO TABS: 2.0000 | ORAL_TABLET | Freq: Four times a day (QID) | ORAL | 0 refills | Status: AC | PRN

## 2020-03-10 MED ORDER — ADULT MULTIVITAMIN W/MINERALS CH: 1.0000 | ORAL_TABLET | Freq: Every day | ORAL | Status: DC

## 2020-03-10 MED ORDER — ENSURE ENLIVE PO LIQD: 237.0000 mL | Freq: Two times a day (BID) | ORAL | 0 refills | Status: DC

## 2020-03-10 MED ORDER — NAPHAZOLINE-GLYCERIN 0.012-0.2 % OP SOLN: 1.0000 [drp] | Freq: Four times a day (QID) | OPHTHALMIC | 0 refills | Status: DC | PRN

## 2020-03-10 MED ORDER — LEVOTHYROXINE SODIUM 75 MCG PO TABS: 75.0000 ug | ORAL_TABLET | Freq: Every day | ORAL | 0 refills | Status: DC

## 2020-03-10 MED ORDER — CEFDINIR 300 MG PO CAPS: 300.0000 mg | ORAL_CAPSULE | Freq: Two times a day (BID) | ORAL | 0 refills | Status: AC

## 2020-03-10 NOTE — Discharge Summary (Signed)
Physician Discharge Summary  Shelia Black SKA:[id number] DOB: 02-08-1921 DOA: 02/28/2020  PCP: Shelia Stalker, PA-C  Admit date: 02/28/2020 Discharge date: 03/10/2020  Time spent: 55 minutes  Recommendations for Outpatient Follow-up:  1. Follow-up with MD at skilled nursing facility.  Patient will need a basic metabolic profile, magnesium level checked in 1 week to follow-up on electrolytes and renal function. 2. Follow-up with palliative care at skilled nursing facility.   Discharge Diagnoses:  Active Problems:   Diverticulitis of colon with bleeding   Diabetes mellitus type 2 in obese (HCC)   Sepsis ([place])   ILD (interstitial lung disease) ([place])   Pulmonary fibrosis (HCC)   GERD (gastroesophageal reflux disease)   Sigmoid diverticulitis   Abscess   Acute diverticulitis   Diverticulitis of intestine with abscess   PNA (pneumonia)   Acute gout of knee   Discharge Condition: Stable and improved.  Diet recommendation: Regular diet with thin liquids.  Filed Weights   02/28/20 1544 03/09/20 1439  Weight: 61.2 kg 69.6 kg    History of present illness:  HPI per Dr.Mujtaba Shelia Black is a 87 y.o. female DM, GERD, COPD, pulmonary fibrosis not on home O2 presented with abdominal pain, difficulty urinating and noticing some blood in her stool earlier on day of admission.   Daughter reported not doing well for the past 1 week with increasing fatigue. Started complaining of lower abdominal pain yesterday.  Daughter thought was likely a urinary tract infection and went to PCPs office for urine sample however, patient was unable to urinate there.  Also her blood pressures were noted to be in 90s therefore PCP decided to send patient to the ED for further evaluation.  Denied any headache or chest pain. No reported fevers or cough. No vomiting or diarrhea noted. Family states she has a history of diverticulosis in the past and is concerned this may be  recurrent   ED Course:  T-max 100.2 Blood pressure 87/32, pulse 71, RR 30, SPO2 97% room air -Sodium 133, potassium 5.1, bicarb 26, serum glucose 181 -BUN 21, creatinine 1.03 -Albumin 3.2.  LFTs and lipase normal -WBC 11, Hb 11, platelets 331 -Lactic acid 1.5  -UA negative for UTI -CT abdomen sigmoid diverticulitis with 3.1 cm abscess.  Honeycomb changes in the lung.  ED meds: -Ciprofloxacin and Flagyl along with 100 cc of normal saline.  Review of Systems: As per HPI otherwise 10 point review of systems negative.   Hospital Course:  1 acute diverticulitis with diverticular abscess 3.1 cm Patient had presented with abdominal pain, difficulty urinating.  Work-up in the ED with CT abdomen and pelvis which was done was concerning for sigmoid diverticulitis with 3.1 cm abscess.  Patient was admitted placed empirically on IV antibiotics and placed on bowel rest.  Seen in consultation by general surgery and recommended evaluation by IR for percutaneous drainage.  Patient seen by IR and felt abscess not amenable to percutaneous drainage due to lack of percutaneous window.    Patient remained afebrile.  Leukocytosis trended down.  Seen by speech therapy and regular diet/soft diet with thin liquids recommended.  Received a dose of IV Cipro on admission and was on IV Rocephin and IV Flagyl and subsequently transitioned to oral clindamycin.  Clindamycin discontinued and patient placed on oral Omnicef and oral Flagyl to complete a full course of antibiotic treatment.  Patient improved clinically and patient be discharged on 3 more days of oral antibiotics to complete a course of treatment.  General surgery was  following but signed off.  Patient be discharged to skilled nursing facility in stable and improved condition.  2.  Hypertension Blood pressure borderline with systolic blood pressure in the low 100s.  Patient initially was placed on Norvasc which was subsequently discontinued.  Blood  pressure remained well controlled off antihypertensive medications.  Outpatient follow-up.   3.?  Aspiration pneumonia Chest x-ray from 03/06/2020 with increased opacities.  Patient also noted to have a leukocytosis.  Speech therapy reassessed patient and patient placed on a regular/soft diet with thin liquids.  Patient initially was on IV antibiotics transitioned to oral clindamycin.  Discontinued clindamycin and placed on oral Omnicef and Flagyl.  Patient also noted to have some expiratory wheezing which improved with nebulizer treatments.  Patient was discharged on 3 more days of oral antibiotics to complete a course of treatment.  Patient also maintained on a PPI.  Outpatient follow-up.  4.  Pain right knee, wrist/probable gout Patient noted to have some debilitating joint pain which was not responsive to patch.  Fentanyl patch discontinued and patient on low-dose Dilaudid.  Uric acid obtained at 4.4.  Daughter describing pain as exquisite, migratory, significant pain when bed sheets touching.  Description of patient's pain concerning for gout.  Rheumatoid factor negative.  double-stranded DNA antibody negative.  Improving clinically on short course of oral prednisone.  Supportive care.  Follow.    5.  COPD/IPF on chronic steroids Patient has been on steroids at baseline previously.    Patient was placed on a short regimen of prednisone 20 mg daily x5 days.  Patient also placed on Robinul as well as Tussionex.  Patient also placed on scheduled duo nebs and Pulmicort due to some wheezing noted.  It was noted early on during the hospitalization, that patient uses 2 L nasal cannula at home baseline however per daughter patient not on home O2.  O2 sats were checked on day of discharge and patient was satting 95% on room air.  Outpatient follow-up.    6.  Mild AKI superimposed on chronic kidney disease stage II Improved and had resolved by day of discharge.    7.  Hypokalemia Repleted with daily  potassium supplementation.  Magnesium also repleted and was 2.1 by day of discharge.  Potassium was 4.9 by day of discharge.  Outpatient follow-up.  8.  Hypothyroidism Synthroid dose was increased to 75 MCG's daily.  Patient will need repeat thyroid function studies done in about 4 to 6 weeks. Outpatient follow-up.   9.  Well-controlled diabetes mellitus type 2 Hemoglobin A1c 6.3 (03/01/2020).    Patient noted to have some elevated CBGs are secondary to steroids.  Patient was started on Lantus 5 units daily and maintained on sliding scale insulin during the hospitalization.  Oral hypoglycemic agents were held and will be resumed on discharge.    10.  GERD Patient maintained on PPI.  Outpatient follow-up.    Procedures:  CT abdomen and pelvis 02/28/2020  Chest x-ray 03/06/2020, 02/28/2020  Plain films of the C-spine to 02/24/2020  Plain films of bilateral knees 03/04/2020  Plain films right humerus 03/09/2020  Consultations:  Palliative care: Dr. Rowe Pavy 03/07/2020  Interventional radiology: Dr. Shon Baton 03/01/2020  General surgery: Dr. Harlow Asa 02/29/2020   Discharge Exam: Vitals:   03/10/20 0504 03/10/20 1341  BP: 111/83 (!) 114/57  Pulse: (!) 58 64  Resp: 19   Temp: 97.8 F (36.6 C) 97.9 F (36.6 C)  SpO2: 97% 94%    General: NAD Cardiovascular: RRR Respiratory: Fine  coarse bibasilar crackles.  No wheezing.  No rhonchi.  Fair air movement.  Speaking in full sentences.  Discharge Instructions   Discharge Instructions    Diet general   Complete by: As directed    Increase activity slowly   Complete by: As directed      Allergies as of 03/10/2020      Reactions   Oxycodone-acetaminophen Nausea Only   Penicillins Hives, Rash   Has patient had a PCN reaction causing immediate rash, facial/tongue/throat swelling, SOB or lightheadedness with hypotension: no Has patient had a PCN reaction causing severe rash involving mucus membranes or skin necrosis: unknown Has patient  had a PCN reaction that required hospitalization : unknown Has patient had a PCN reaction occurring within the last 10 years: no If all of the above answers are "NO", then may proceed with Cephalosporin use.   Codeine    Nausea and vomiting   Percocet [oxycodone-acetaminophen] Nausea And Vomiting   Tramadol Nausea And Vomiting      Medication List    TAKE these medications   acetaminophen 500 MG tablet Commonly known as: TYLENOL Take 500-1,000 mg by mouth in the morning and at bedtime.   albuterol 108 (90 Base) MCG/ACT inhaler Commonly known as: VENTOLIN HFA Inhale 1 puff into the lungs every 6 (six) hours as needed for wheezing or shortness of breath.   ammonium lactate 12 % lotion Commonly known as: LAC-HYDRIN Apply 1 application topically daily as needed for dry skin.   cefdinir 300 MG capsule Commonly known as: OMNICEF Take 1 capsule (300 mg total) by mouth every 12 (twelve) hours for 3 days.   chlorpheniramine-HYDROcodone 10-8 MG/5ML Suer Commonly known as: TUSSIONEX Take 5 mLs by mouth every 12 (twelve) hours as needed for cough.   diclofenac 1.3 % Ptch Commonly known as: FLECTOR Place 1 patch onto the skin 2 (two) times daily.   diphenoxylate-atropine 2.5-0.025 MG tablet Commonly known as: LOMOTIL Take 2 tablets by mouth 4 (four) times daily as needed for diarrhea or loose stools. diarrhea   feeding supplement Liqd Take 237 mLs by mouth 2 (two) times daily between meals. Start taking on: March 11, 2020   glycopyrrolate 2 MG tablet Commonly known as: ROBINUL Take 1 tablet (2 mg total) by mouth in the morning.   HYDROmorphone 2 MG tablet Commonly known as: DILAUDID Take 0.5 tablets (1 mg total) by mouth every 3 (three) hours as needed for moderate pain.   ipratropium-albuterol 0.5-2.5 (3) MG/3ML Soln Commonly known as: DUONEB Take 3 mLs by nebulization 2 (two) times daily for 5 days.   levothyroxine 75 MCG tablet Commonly known as: SYNTHROID Take 1  tablet (75 mcg total) by mouth daily before breakfast. What changed:   medication strength  how much to take   metFORMIN 500 MG tablet Commonly known as: GLUCOPHAGE Take 1 tablet by mouth daily.   metroNIDAZOLE 500 MG tablet Commonly known as: FLAGYL Take 1 tablet (500 mg total) by mouth every 8 (eight) hours for 3 days.   multivitamin with minerals Tabs tablet Take 1 tablet by mouth daily. Start taking on: March 11, 2020   naphazoline-glycerin 0.012-0.2 % Soln Commonly known as: CLEAR EYES REDNESS Place 1-2 drops into the right eye 4 (four) times daily as needed for eye irritation.   omeprazole 40 MG capsule Commonly known as: PRILOSEC Take 1 capsule (40 mg total) by mouth daily.   polyethylene glycol 17 g packet Commonly known as: MIRALAX / GLYCOLAX Take 17 g by  mouth daily. Start taking on: March 11, 2020   senna-docusate 8.6-50 MG tablet Commonly known as: Senokot-S Take 1 tablet by mouth 2 (two) times daily.   traZODone 50 MG tablet Commonly known as: DESYREL Take 25 mg by mouth at bedtime as needed for sleep.   vitamin C 1000 MG tablet Take 1,000 mg by mouth daily.   Vitamin D-3 25 MCG (1000 UT) Caps Take 2,000 Units by mouth daily.   Zinc 50 MG Caps Take 50 mg by mouth daily.            Durable Medical Equipment  (From admission, onward)         Start     Ordered   03/08/20 2009  For home use only DME 3 n 1  Once        03/08/20 2008         Allergies  Allergen Reactions  . Oxycodone-Acetaminophen Nausea Only  . Penicillins Hives and Rash    Has patient had a PCN reaction causing immediate rash, facial/tongue/throat swelling, SOB or lightheadedness with hypotension: no Has patient had a PCN reaction causing severe rash involving mucus membranes or skin necrosis: unknown Has patient had a PCN reaction that required hospitalization : unknown Has patient had a PCN reaction occurring within the last 10 years: no If all of the above  answers are "NO", then may proceed with Cephalosporin use.   . Codeine     Nausea and vomiting  . Percocet [Oxycodone-Acetaminophen] Nausea And Vomiting  . Tramadol Nausea And Vomiting    Contact information for follow-up providers    Palliative Care Follow up.        MD AT SNF Follow up.            Contact information for after-discharge care    Destination    [facility] Preferred SNF .   Service: Skilled Nursing Contact information: [address] [place] [phone]                   The results of significant diagnostics from this hospitalization (including imaging, microbiology, ancillary and laboratory) are listed below for reference.    Significant Diagnostic Studies: DG Chest 1 View  Result Date: 03/06/2020 CLINICAL DATA:  Pneumonia EXAM: CHEST  1 VIEW COMPARISON:  Chest x-ray 02/28/2020 FINDINGS: The heart size and mediastinal contours are unchanged. Aortic arch calcification. Interval increase in retrocardiac opacity. Likely trace bilateral pleural effusion. Redemonstration of bilateral lower lung zone patchy airspace opacity. Redemonstration of coarsened and increased interstitial markings. Biapical pleural/pulmonary scarring. No pneumothorax. No acute osseous abnormality. IMPRESSION: 1. Multifocal pneumonia with worsened retrocardiac opacity. 2. Likely bilateral trace pleural effusions. Electronically Signed   By: Iven Finn M.D.   On: 03/06/2020 06:00   DG Cervical Spine 2 or 3 views  Result Date: 03/07/2020 CLINICAL DATA:  Cervical spine pain, worsening progressively. Severe pain limiting evaluation. EXAM: CERVICAL SPINE - 2-3 VIEW COMPARISON:  CT of the cervical spine from January of 2020 FINDINGS: Very limited examination perhaps due to patient discomfort and inability to position the patient in order to obtain adequate C-spine evaluation. Cervical spine imaged only through the midportion of C6 with marked  degenerative changes noted particularly at C4-5 and C5-6. Calcification of the nuchal ligament with similar appearance. Visualized prevertebral soft tissues are unremarkable. Slight curvature to the RIGHT on frontal radiograph apparently patient unable to straighten neck for the evaluation. There are coarse interstitial markings noted  at the lung apices also displayed on recent chest radiograph. Examination is severely limited by limitations related to patient positioning. In the setting of worsening cervical spine pain CT may be helpful as standard radiographs may not allow for complete coverage particularly in the lateral view. IMPRESSION: 1. Limited evaluation of the cervical spine perhaps due to patient discomfort and inability to position the patient in order to obtain adequate C-spine evaluation. CT may be helpful for further evaluation of the cervical spine. 2. Multilevel degenerative disease in visualized portions of the spine. 3. Coarse interstitial markings in the lung apices displayed on recent chest radiograph. Electronically Signed   By: Zetta Bills M.D.   On: 03/07/2020 15:54   DG Knee 1-2 Views Right  Result Date: 03/04/2020 CLINICAL DATA:  RIGHT knee pain post fall EXAM: RIGHT KNEE - 1-2 VIEW COMPARISON:  Portable exam 1405 hours without priors for comparison FINDINGS: Osseous demineralization. Components of a RIGHT knee prosthesis are identified. No acute fracture, dislocation, bone destruction or periprosthetic lucency. Small knee joint effusion. IMPRESSION: RIGHT knee prosthesis with small joint effusion. No acute osseous abnormalities. Electronically Signed   By: Lavonia Dana M.D.   On: 03/04/2020 14:17   CT Abdomen Pelvis W Contrast  Result Date: 02/28/2020 CLINICAL DATA:  Acute abdominal pain. Difficulty urinating. Small amount of blood in the rectum. EXAM: CT ABDOMEN AND PELVIS WITH CONTRAST TECHNIQUE: Multidetector CT imaging of the abdomen and pelvis was performed using the  standard protocol following bolus administration of intravenous contrast. CONTRAST:  15m OMNIPAQUE IOHEXOL 300 MG/ML  SOLN COMPARISON:  12/29/2019 FINDINGS: Lower chest: Mild cardiac enlargement. Coronary artery calcifications. Fibrosis and honeycomb changes in the lung bases. Small esophageal hiatal hernia. Hepatobiliary: No focal liver abnormality is seen. No gallstones, gallbladder wall thickening, or biliary dilatation. Pancreas: Unremarkable. No pancreatic ductal dilatation or surrounding inflammatory changes. Spleen: Normal in size without focal abnormality. Adrenals/Urinary Tract: Adrenal glands are unremarkable. Kidneys are normal, without renal calculi, focal lesion, or hydronephrosis. Bladder is decompressed with a Foley catheter. Stomach/Bowel: Diverticulum off of the cardiac region of the stomach. Stomach is decompressed without wall thickening. Small bowel are mostly decompressed with scattered fluid. No wall thickening. Scattered stool throughout the colon without colonic distention. Diverticulosis of the sigmoid colon. The rectosigmoid junction demonstrates wall thickening and edema with adjacent stranding. 3.1 cm diameter cystic collection suggested along the lateral wall of the rectosigmoid junction. The appearance suggests focal acute diverticulitis with an abscess. An underlying mass lesion could also have this appearance and should be excluded in follow-up. Vascular/Lymphatic: Aortic atherosclerosis. No enlarged abdominal or pelvic lymph nodes. Reproductive: Uterus and bilateral adnexa are unremarkable. Other: No abdominal wall hernia or abnormality. No abdominopelvic ascites. Musculoskeletal: Degenerative changes in the spine and hips. IMPRESSION: 1. Diverticulosis of the sigmoid colon with wall thickening, edema and adjacent stranding. 3.1 cm diameter cystic collection suggested along the lateral wall of the rectosigmoid junction. The appearance suggests focal acute diverticulitis with an  abscess. An underlying mass lesion could also have this appearance and should be excluded in follow-up. 2. Small esophageal hiatal hernia. 3. Fibrosis and honeycomb changes in the lung bases. 4. Aortic atherosclerosis. 5. Foley catheter in the bladder. 6. Diverticulum off of the cardiac region of the stomach. 7. Degenerative changes in the spine and hips. Aortic Atherosclerosis (ICD10-I70.0). Electronically Signed   By: WLucienne CapersM.D.   On: 02/28/2020 21:50   DG Chest Port 1 View  Result Date: 02/28/2020 CLINICAL DATA:  Oxygen desaturation EXAM:  PORTABLE CHEST 1 VIEW COMPARISON:  05/24/2016 FINDINGS: Diffuse coarse reticular interstitial opacities suspicious for fibrosis. No consolidation or effusion. Stable cardiomediastinal silhouette with aortic atherosclerosis. No pneumothorax IMPRESSION: Diffuse coarse reticular interstitial opacities suspicious for pulmonary fibrosis. No definite acute airspace disease. Electronically Signed   By: Donavan Foil M.D.   On: 02/28/2020 23:18   DG Humerus Right  Result Date: 03/09/2020 CLINICAL DATA:  Palpable abnormality of the right upper arm. No known recent injury. EXAM: RIGHT HUMERUS - 2+ VIEW COMPARISON:  X-ray 05/01/2004 FINDINGS: Previously seen displaced right humeral diaphyseal fracture is well healed. No acute fracture or dislocation. Degenerative changes of the shoulder and elbow. Soft tissues appear within normal limits. IMPRESSION: Well healed remote right humeral fracture. This may correspond to reported palpable abnormality. No acute osseous findings. Electronically Signed   By: Davina Poke D.O.   On: 03/09/2020 13:25    Microbiology: Recent Results (from the past 240 hour(s))  SARS CORONAVIRUS 2 (TAT 6-24 HRS) Nasopharyngeal Nasopharyngeal Swab     Status: None   Collection Time: 03/09/20  4:00 PM   Specimen: Nasopharyngeal Swab  Result Value Ref Range Status   SARS Coronavirus 2 NEGATIVE NEGATIVE Final    Comment: (NOTE) SARS-CoV-2  target nucleic acids are NOT DETECTED.  The SARS-CoV-2 RNA is generally detectable in upper and lower respiratory specimens during the acute phase of infection. Negative results do not preclude SARS-CoV-2 infection, do not rule out co-infections with other pathogens, and should not be used as the sole basis for treatment or other patient management decisions. Negative results must be combined with clinical observations, patient history, and epidemiological information. The expected result is Negative.  Fact Sheet for Patients: SugarRoll.be  Fact Sheet for Healthcare Providers: https://www.woods-mathews.com/  This test is not yet approved or cleared by the [place] FDA and  has been authorized for detection and/or diagnosis of SARS-CoV-2 by FDA under an Emergency Use Authorization (EUA). This EUA will remain  in effect (meaning this test can be used) for the duration of the COVID-19 declaration under Se ction 564(b)(1) of the Act, 21 U.S.C. section 360bbb-3(b)(1), unless the authorization is terminated or revoked sooner.  Performed at [facility], [place] [address], [place], [place] [address]      Labs: Basic Metabolic Panel: Recent Labs  Lab 03/06/20 0610 03/07/20 0516 03/08/20 0542 03/09/20 0454 03/10/20 0449  NA 137 138 138 137 139  K 3.4* 3.6 4.3 3.9 4.9  CL 103 104 104 104 101  CO2 25 24 24 24 27   GLUCOSE 127* 177* 259* 220* 149*  BUN 11 14 19 20 16   CREATININE 0.58 0.62 0.64 0.72 0.71  CALCIUM 8.2* 8.5* 8.3* 8.3* 8.7*  MG  --   --   --  1.7 2.1   Liver Function Tests: Recent Labs  Lab 03/04/20 0629 03/06/20 0610 03/07/20 0516 03/08/20 0542  AST 17 13* 16 26  ALT 15 11 11 11   ALKPHOS 44 42 45 59  BILITOT 0.5 0.4 0.4 0.5  PROT 5.7* 5.3* 5.5* 5.4*  ALBUMIN 2.5* 2.3* 2.3* 2.3*   No results for input(s): LIPASE, AMYLASE in the last 168 hours. No results for input(s): AMMONIA in the last 168  hours. CBC: Recent Labs  Lab 03/04/20 0629 03/05/20 0555 03/06/20 0610 03/07/20 0516 03/08/20 0542 03/09/20 0454  WBC 12.3* 13.0* 11.7* 12.6* 10.9* 11.1*  NEUTROABS 8.0*  --  7.4 9.4* 8.3* 7.2  HGB 10.4* 10.7* 10.0* 10.1* 9.9* 9.9*  HCT 32.3*  32.1* 31.0* 31.0* 30.3* 30.5*  MCV 104.5* 100.9* 102.6* 101.6* 101.7* 100.7*  PLT 414* 510* 444* 499* 500* 454*   Cardiac Enzymes: No results for input(s): CKTOTAL, CKMB, CKMBINDEX, TROPONINI in the last 168 hours. BNP: BNP (last 3 results) Recent Labs    03/01/20 0408  BNP 625.9*    ProBNP (last 3 results) No results for input(s): PROBNP in the last 8760 hours.  CBG: Recent Labs  Lab 03/09/20 0737 03/09/20 1151 03/09/20 1653 03/10/20 0733 03/10/20 1138  GLUCAP 148* 139* 287* 119* 169*       Signed:  Irine Seal MD.  [facility] 03/10/2020, 2:22 PM

## 2020-03-10 NOTE — Addendum Note (Signed)
Addended by: Gayland Curry on: 03/10/2020 04:54 PM   Modules accepted: Orders

## 2020-03-10 NOTE — TOC Transition Note (Signed)
Transition of Care [facility]) - CM/SW Discharge Note   Patient Details  Name: Shelia Black MRN: [id number] Date of Birth: 1921/08/30  [facility] CM/SW Contact:  Lia Hopping, [place] Phone Number: 03/10/2020, 10:13 AM   Clinical Narrative:    CSW confirm [facility] ready to accept the patient  Nurse call report to: [phone] [facility] to transport.  Daughter completing consent at SNF.   Final next level of care: Skilled Nursing Facility Barriers to Discharge: Barriers Resolved   Patient Goals and CMS Choice Patient states their goals for this hospitalization and ongoing recovery are:: Per daughter, "I wish I could take her home but there is no way I can care for her like this." CMS Medicare.gov Compare Post Acute Care list provided to:: Other (Comment Required) (Daughter [name] [phone]) Choice offered to / list presented to : Adult Children  Discharge Placement   Existing PASRR number confirmed : 03/06/20          Patient chooses bed at: [facility] Patient to be transferred to facility by: [facility] Name of family member notified: Daughter-[name] Patient and family notified of of transfer: 03/10/20  Discharge Plan and Services In-house Referral: Clinical Social Work   Post Acute Care Choice: Durable Medical Equipment                               Social Determinants of Health (SDOH) Interventions     Readmission Risk Interventions No flowsheet data found.

## 2020-03-10 NOTE — Progress Notes (Signed)
Pt d/c from room 1314 via [facility] to SNF. Daughter was present at bedside during d/c. VSS, patient denies in distress. IV removed from LFA. No personal items left in the room. Told by prior RN Darl Pikes) report was already called to SNF.

## 2020-03-13 ENCOUNTER — Non-Acute Institutional Stay (SKILLED_NURSING_FACILITY): Payer: Medicare Other | Admitting: Orthopedic Surgery

## 2020-03-13 ENCOUNTER — Encounter: Payer: Self-pay | Admitting: Orthopedic Surgery

## 2020-03-13 DIAGNOSIS — K5720 Diverticulitis of large intestine with perforation and abscess without bleeding: Secondary | ICD-10-CM | POA: Diagnosis not present

## 2020-03-13 DIAGNOSIS — N182 Chronic kidney disease, stage 2 (mild): Secondary | ICD-10-CM

## 2020-03-13 DIAGNOSIS — R413 Other amnesia: Secondary | ICD-10-CM

## 2020-03-13 DIAGNOSIS — K219 Gastro-esophageal reflux disease without esophagitis: Secondary | ICD-10-CM

## 2020-03-13 DIAGNOSIS — M1A0310 Idiopathic chronic gout, right wrist, without tophus (tophi): Secondary | ICD-10-CM

## 2020-03-13 DIAGNOSIS — J8410 Pulmonary fibrosis, unspecified: Secondary | ICD-10-CM

## 2020-03-13 DIAGNOSIS — I10 Essential (primary) hypertension: Secondary | ICD-10-CM

## 2020-03-13 DIAGNOSIS — Z794 Long term (current) use of insulin: Secondary | ICD-10-CM

## 2020-03-13 DIAGNOSIS — E039 Hypothyroidism, unspecified: Secondary | ICD-10-CM

## 2020-03-13 DIAGNOSIS — E118 Type 2 diabetes mellitus with unspecified complications: Secondary | ICD-10-CM

## 2020-03-13 DIAGNOSIS — Z978 Presence of other specified devices: Secondary | ICD-10-CM

## 2020-03-13 NOTE — Progress Notes (Signed)
Location:    [facility] Room Number: 110/P Place of Service:  SNF [phone]) Provider: Windell Moulding NP   [name], PA-C  Patient Care Team: [name], PA-C as PCP - General (Family Medicine) System, Provider Not In  Extended Emergency Contact Information Primary Emergency Contact: [name],[name] Address: [place], [place] [address] [name] of [place] Phone: [phone] Mobile Phone: [phone] Relation: Daughter Secondary Emergency Contact: [name] of [place] Phone: [phone] Relation: Son  Code Status: Full Code Goals of care: Advanced Directive information Advanced Directives 03/13/2020  Does Patient Have a Medical Advance Directive? No  Type of Advance Directive -  Does patient want to make changes to medical advance directive? No - Patient declined  Copy of [place] in Chart? -  Would patient like information on creating a medical advance directive? -     Chief Complaint  Patient presents with  . Hospitalization Follow-up    Hospitalization Follow Up     HPI:  Pt is a 87 y.o. female seen today for a hospital f/u s/p admission from [facility] 01/24- 02/04.   She currently resides at [facility]. PMH includes: diabetes, GERD, COPD, pulmonary fibrosis, hypothyroidism, chronic kidney disease stage III, back pain and depression.   Prior to hospitalization, daughter reported increased fatigue. She presented to PCP with abdominal pain and difficulty urinating. PCP could not obtain urine sample, in addition, her bp was in the 90's. She was sent to ED for further evaluation. In the ED, bp was 87/32, HR 71, RR 30, O2 sat 97 %. Labs: WBC 11. HBG 11.0Platelets 331. NA 133. K 5.1. Bicarb 26. Glucose 181. BUN 21. Creat 1.03. albumin 3.2. Lactic acid 1.5. LFT and lipase normal. UA negative. CT abdomen revealed sigmoid  diverticulitis with 3.1 cm abscess. CT also revealed honeycomb changes in lung. CXR diffuse coarse reticular interstitial opacities suspicious for fibrosis. She was admitted for acute sigmoid diverticulitis with abscess, sepsis. Given IV abx and placed on bowel rest. General surgery consulted. Advised CT guided drain by IR. IR felt abscess was not amenable to percutaneous drainage due to lack of drainage. Since she remained afebrile and leukocytosis trended down no drain was placed. Eventually transitioned to po clindamycin, and then oral omnicef and oral flagyl. Given IVF for hypotension. Norvasc discontinued. Aspiration pneumonia questioned during stay. ST placed her on regular/soft diet and thin liquids. Some expiratory wheezing noted during stay, but improved with neb treatments. Advised to remain on oral abx and PPI. COPD treated with short dose prednisone 20 mg x 5 days. Also given robinul and tussionex. Right knee pain treated with low dose dilaudid, fentanyl patch d/c. Wrist gout exacerbated with bed sheets touching. Uric acid 4.4. Rheumatoid factor negative. She improved with oral prednisone. AKI and hypokalemia improved during stay. Blood sugars controlled with Lantus 5 units daily and sliding scale. PT recommended snf due to weakness.   Today, she is laying in bed. Easily aroused, alert to self and familiar faces only. Does not follow all commands. Can express some needs. She denies abdominal pain. Foley present on admission. Appetite fair. Remains on regular diet with thin liquids. Dependent on caregivers for ADL's. 02/6 she was found by staff sitting on the floor. No apparent injuries, vitals and neuro checks stable.   Past Medical History:  Diagnosis Date  . Arthritis   . Cataract   .  Chronic cough   . COPD (chronic obstructive pulmonary disease) ([place])   . Diabetes mellitus without complication ([place])   . Diverticulitis   . Diverticulosis   . GERD (gastroesophageal reflux disease)   .  Hepatic steatosis   . Hiatal hernia   . Hypothyroidism   . ILD (interstitial lung disease) ([place])   . Pulmonary fibrosis ([place])   . Thyroid disease   . Vertigo    Past Surgical History:  Procedure Laterality Date  . APPENDECTOMY    . INTRAOCULAR LENS INSERTION  1997  . REPLACEMENT TOTAL KNEE BILATERAL    . SHOULDER SURGERY Right    tendonitis    Allergies  Allergen Reactions  . Oxycodone-Acetaminophen Nausea Only  . Penicillins Hives and Rash    Has patient had a PCN reaction causing immediate rash, facial/tongue/throat swelling, SOB or lightheadedness with hypotension: no Has patient had a PCN reaction causing severe rash involving mucus membranes or skin necrosis: unknown Has patient had a PCN reaction that required hospitalization : unknown Has patient had a PCN reaction occurring within the last 10 years: no If all of the above answers are "NO", then may proceed with Cephalosporin use.   . Codeine     Nausea and vomiting  . Percocet [Oxycodone-Acetaminophen] Nausea And Vomiting  . Tramadol Nausea And Vomiting    Allergies as of 03/13/2020      Reactions   Oxycodone-acetaminophen Nausea Only   Penicillins Hives, Rash   Has patient had a PCN reaction causing immediate rash, facial/tongue/throat swelling, SOB or lightheadedness with hypotension: no Has patient had a PCN reaction causing severe rash involving mucus membranes or skin necrosis: unknown Has patient had a PCN reaction that required hospitalization : unknown Has patient had a PCN reaction occurring within the last 10 years: no If all of the above answers are "NO", then may proceed with Cephalosporin use.   Codeine    Nausea and vomiting   Percocet [oxycodone-acetaminophen] Nausea And Vomiting   Tramadol Nausea And Vomiting      Medication List       Accurate as of March 13, 2020  9:46 AM. If you have any questions, ask your nurse or doctor.        acetaminophen 500 MG tablet Commonly known as:  TYLENOL Take 500-1,000 mg by mouth in the morning and at bedtime.   albuterol 108 (90 Base) MCG/ACT inhaler Commonly known as: VENTOLIN HFA Inhale 1 puff into the lungs every 6 (six) hours as needed for wheezing or shortness of breath.   ammonium lactate 12 % lotion Commonly known as: LAC-HYDRIN Apply 1 application topically daily as needed for dry skin.   cefdinir 300 MG capsule Commonly known as: OMNICEF Take 1 capsule (300 mg total) by mouth every 12 (twelve) hours for 3 days.   chlorpheniramine-HYDROcodone 10-8 MG/5ML Suer Commonly known as: TUSSIONEX Take 5 mLs by mouth every 12 (twelve) hours as needed for cough.   CLEAR EYES REDNESS RELIEF OP Apply 2 drops to eye 4 (four) times daily as needed. What changed: Another medication with the same name was removed. Continue taking this medication, and follow the directions you see here. Changed by: Yvonna Alanis, NP   diclofenac 1.3 % Ptch Commonly known as: FLECTOR Place 1 patch onto the skin 2 (two) times daily. Right wrist/hand twice daily for pain What changed: Another medication with the same name was removed. Continue taking this medication, and follow the directions you see here. Changed by: Mervyn Gay  Cleophas Dunker, NP   diphenoxylate-atropine 2.5-0.025 MG tablet Commonly known as: LOMOTIL Take 2 tablets by mouth 4 (four) times daily as needed for diarrhea or loose stools. diarrhea   feeding supplement Liqd Take 237 mLs by mouth 2 (two) times daily between meals.   glycopyrrolate 2 MG tablet Commonly known as: ROBINUL Take 1 tablet (2 mg total) by mouth in the morning.   HYDROmorphone 2 MG tablet Commonly known as: DILAUDID Take 0.5 tablets (1 mg total) by mouth every 3 (three) hours as needed for moderate pain.   ipratropium-albuterol 0.5-2.5 (3) MG/3ML Soln Commonly known as: DUONEB Take 3 mLs by nebulization 2 (two) times daily for 5 days.   levothyroxine 75 MCG tablet Commonly known as: SYNTHROID Take 1 tablet (75 mcg  total) by mouth daily before breakfast.   metFORMIN 500 MG tablet Commonly known as: GLUCOPHAGE Take 1 tablet by mouth daily.   metroNIDAZOLE 500 MG tablet Commonly known as: FLAGYL Take 1 tablet (500 mg total) by mouth every 8 (eight) hours for 3 days.   multivitamin with minerals Tabs tablet Take 1 tablet by mouth daily.   omeprazole 40 MG capsule Commonly known as: PRILOSEC Take 1 capsule (40 mg total) by mouth daily.   ondansetron 4 MG tablet Commonly known as: ZOFRAN Take 4 mg by mouth every 6 (six) hours as needed for nausea or vomiting.   polyethylene glycol 17 g packet Commonly known as: MIRALAX / GLYCOLAX Take 17 g by mouth daily.   senna-docusate 8.6-50 MG tablet Commonly known as: Senokot-S Take 1 tablet by mouth 2 (two) times daily.   traZODone 50 MG tablet Commonly known as: DESYREL Take 25 mg by mouth at bedtime as needed for sleep.   vitamin C 1000 MG tablet Take 1,000 mg by mouth daily.   Vitamin D-3 25 MCG (1000 UT) Caps Take 2,000 Units by mouth daily.   Zinc 50 MG Caps Take 50 mg by mouth daily.       Review of Systems  Unable to perform ROS: Dementia    Immunization History  Administered Date(s) Administered  . DT (Pediatric) 02/04/1978, 02/05/1988  . Influenza Split 11/03/2018  . Influenza Whole 12/09/2006  . Influenza, High Dose Seasonal PF 01/24/2017, 01/08/2018  . Influenza, Seasonal, Injecte, Preservative Fre 11/07/2009, 11/30/2010, 11/25/2011  . Influenza,inj,Quad PF,6+ Mos 12/22/2012, 01/20/2014, 11/19/2019  . Influenza-Unspecified 11/13/2015  . PFIZER(Purple Top)SARS-COV-2 Vaccination 03/31/2019, 04/28/2019, 11/15/2019  . Pneumococcal Conjugate-13 06/24/2013  . Pneumococcal Polysaccharide-23 08/08/2017, 10/27/2018  . Pneumococcal-Unspecified 11/25/2000   Pertinent  Health Maintenance Due  Topic Date Due  . FOOT EXAM  Never done  . OPHTHALMOLOGY EXAM  Never done  . URINE MICROALBUMIN  Never done  . DEXA SCAN  Never done   . HEMOGLOBIN A1C  08/29/2020  . INFLUENZA VACCINE  Completed  . PNA vac Low Risk Adult  Completed   No flowsheet data found. Functional Status Survey:    Vitals:   03/13/20 0944  BP: 118/70  Pulse: 72  Resp: 18  Temp: (!) 97.1 F (36.2 C)  SpO2: 95%  Weight: 153 lb 7 oz (69.6 kg)  Height: 5' 4"  (1.626 m)   Body mass index is 26.34 kg/m. Physical Exam Vitals reviewed.  Constitutional:      General: She is not in acute distress. HENT:     Head: Normocephalic.     Right Ear: There is no impacted cerumen.     Left Ear: There is no impacted cerumen.     Nose:  Nose normal.     Mouth/Throat:     Mouth: Mucous membranes are moist.  Eyes:     General:        Right eye: No discharge.        Left eye: No discharge.  Cardiovascular:     Rate and Rhythm: Normal rate and regular rhythm.     Pulses: Normal pulses.     Heart sounds: Normal heart sounds. No murmur heard.   Pulmonary:     Effort: Pulmonary effort is normal. No respiratory distress.     Breath sounds: Normal breath sounds. No wheezing.  Abdominal:     General: Abdomen is flat. There is no distension.     Palpations: Abdomen is soft.     Tenderness: There is no abdominal tenderness. There is no guarding.     Comments: Hypoactive bowel sounds x 4  Musculoskeletal:     Cervical back: Normal range of motion.     Right lower leg: No edema.     Left lower leg: No edema.  Lymphadenopathy:     Cervical: No cervical adenopathy.  Skin:    General: Skin is warm and dry.     Capillary Refill: Capillary refill takes less than 2 seconds.     Comments: Small skin tear to buttocks, no drainage, covered with foam dressing. Healing bruise to right upper arm.   Neurological:     General: No focal deficit present.     Mental Status: She is alert. Mental status is at baseline.     Motor: Weakness present.     Gait: Gait abnormal.  Psychiatric:        Mood and Affect: Mood normal. Affect is flat.        Behavior: Behavior  normal.        Cognition and Memory: Memory is impaired.     Labs reviewed: Recent Labs    03/08/20 0542 03/09/20 0454 03/10/20 0449  NA 138 137 139  K 4.3 3.9 4.9  CL 104 104 101  CO2 24 24 27   GLUCOSE 259* 220* 149*  BUN 19 20 16   CREATININE 0.64 0.72 0.71  CALCIUM 8.3* 8.3* 8.7*  MG  --  1.7 2.1   Recent Labs    03/06/20 0610 03/07/20 0516 03/08/20 0542  AST 13* 16 26  ALT 11 11 11   ALKPHOS 42 45 59  BILITOT 0.4 0.4 0.5  PROT 5.3* 5.5* 5.4*  ALBUMIN 2.3* 2.3* 2.3*   Recent Labs    03/07/20 0516 03/08/20 0542 03/09/20 0454  WBC 12.6* 10.9* 11.1*  NEUTROABS 9.4* 8.3* 7.2  HGB 10.1* 9.9* 9.9*  HCT 31.0* 30.3* 30.5*  MCV 101.6* 101.7* 100.7*  PLT 499* 500* 454*   Lab Results  Component Value Date   TSH 5.492 (H) 03/01/2020   Lab Results  Component Value Date   HGBA1C 6.3 (H) 03/01/2020   No results found for: CHOL, HDL, LDLCALC, LDLDIRECT, TRIG, CHOLHDL  Significant Diagnostic Results in last 30 days:  CT Abdomen Pelvis W Contrast  Result Date: 02/28/2020 CLINICAL DATA:  Acute abdominal pain. Difficulty urinating. Small amount of blood in the rectum. EXAM: CT ABDOMEN AND PELVIS WITH CONTRAST TECHNIQUE: Multidetector CT imaging of the abdomen and pelvis was performed using the standard protocol following bolus administration of intravenous contrast. CONTRAST:  129m OMNIPAQUE IOHEXOL 300 MG/ML  SOLN COMPARISON:  12/29/2019 FINDINGS: Lower chest: Mild cardiac enlargement. Coronary artery calcifications. Fibrosis and honeycomb changes in the lung bases. Small esophageal hiatal  hernia. Hepatobiliary: No focal liver abnormality is seen. No gallstones, gallbladder wall thickening, or biliary dilatation. Pancreas: Unremarkable. No pancreatic ductal dilatation or surrounding inflammatory changes. Spleen: Normal in size without focal abnormality. Adrenals/Urinary Tract: Adrenal glands are unremarkable. Kidneys are normal, without renal calculi, focal lesion, or  hydronephrosis. Bladder is decompressed with a Foley catheter. Stomach/Bowel: Diverticulum off of the cardiac region of the stomach. Stomach is decompressed without wall thickening. Small bowel are mostly decompressed with scattered fluid. No wall thickening. Scattered stool throughout the colon without colonic distention. Diverticulosis of the sigmoid colon. The rectosigmoid junction demonstrates wall thickening and edema with adjacent stranding. 3.1 cm diameter cystic collection suggested along the lateral wall of the rectosigmoid junction. The appearance suggests focal acute diverticulitis with an abscess. An underlying mass lesion could also have this appearance and should be excluded in follow-up. Vascular/Lymphatic: Aortic atherosclerosis. No enlarged abdominal or pelvic lymph nodes. Reproductive: Uterus and bilateral adnexa are unremarkable. Other: No abdominal wall hernia or abnormality. No abdominopelvic ascites. Musculoskeletal: Degenerative changes in the spine and hips. IMPRESSION: 1. Diverticulosis of the sigmoid colon with wall thickening, edema and adjacent stranding. 3.1 cm diameter cystic collection suggested along the lateral wall of the rectosigmoid junction. The appearance suggests focal acute diverticulitis with an abscess. An underlying mass lesion could also have this appearance and should be excluded in follow-up. 2. Small esophageal hiatal hernia. 3. Fibrosis and honeycomb changes in the lung bases. 4. Aortic atherosclerosis. 5. Foley catheter in the bladder. 6. Diverticulum off of the cardiac region of the stomach. 7. Degenerative changes in the spine and hips. Aortic Atherosclerosis (ICD10-I70.0). Electronically Signed   By: Lucienne Capers M.D.   On: 02/28/2020 21:50   DG Chest Port 1 View  Result Date: 02/28/2020 CLINICAL DATA:  Oxygen desaturation EXAM: PORTABLE CHEST 1 VIEW COMPARISON:  05/24/2016 FINDINGS: Diffuse coarse reticular interstitial opacities suspicious for fibrosis.  No consolidation or effusion. Stable cardiomediastinal silhouette with aortic atherosclerosis. No pneumothorax IMPRESSION: Diffuse coarse reticular interstitial opacities suspicious for pulmonary fibrosis. No definite acute airspace disease. Electronically Signed   By: Donavan Foil M.D.   On: 02/28/2020 23:18    Assessment/Plan 1. Diverticulitis of large intestine with abscess, unspecified bleeding status - remains afebrile, bowels hypoactive, no tenderness with palpation - oral omnicef and flagyl completed today - cbc/diff in 1 week - mag level in 1 week  2. Essential hypertension - bp < 150/90 without medication - continue to limit sodium from diet - cbc/diff in 1 week - bmp in 1 week  3. Gastroesophageal reflux disease without esophagitis - stable with prilosec  4. Pulmonary fibrosis ([place]) - not requiring oxygen at this time - cont tussionex, robinul, and albuterol prn  5. Chronic gout of right wrist, unspecified cause - stable, minimal pain, completed prednisone in hospital  6. Memory impairment - oriented to self and familiar faces - fall 02/06 - continue neuro checks and fall safety plan  7. Chronic kidney disease, stage II (mild) -continue to avoid nephrotoxic drugs like NSAIDS, dose adjust medications to be renally excreted - encourage hydration - bmp in 1 week  8. Acquired hypothyroidism - stable with levothyroxine - TSH in 4 to 6 weeks  9. Controlled type 2 diabetes mellitus with complication, with long-term current use of insulin (HCC) - stable with metformin, sliding scale and lantus  10. Indwelling Foley catheter present - placed during hospitalization, discharge recommendations absent in discharge summary - urine yellow and cloudy - will f/u with palliative  Family/ staff Communication: Plan discussed with facility nurse  Labs/tests ordered:  Cbc/diff, bmp, mag

## 2020-03-14 ENCOUNTER — Non-Acute Institutional Stay (SKILLED_NURSING_FACILITY): Payer: Medicare Other | Admitting: Internal Medicine

## 2020-03-14 ENCOUNTER — Other Ambulatory Visit: Payer: Self-pay | Admitting: Orthopedic Surgery

## 2020-03-14 ENCOUNTER — Encounter: Payer: Self-pay | Admitting: Internal Medicine

## 2020-03-14 DIAGNOSIS — N182 Chronic kidney disease, stage 2 (mild): Secondary | ICD-10-CM

## 2020-03-14 DIAGNOSIS — Z978 Presence of other specified devices: Secondary | ICD-10-CM

## 2020-03-14 DIAGNOSIS — I10 Essential (primary) hypertension: Secondary | ICD-10-CM

## 2020-03-14 DIAGNOSIS — K5732 Diverticulitis of large intestine without perforation or abscess without bleeding: Secondary | ICD-10-CM | POA: Diagnosis not present

## 2020-03-14 DIAGNOSIS — E118 Type 2 diabetes mellitus with unspecified complications: Secondary | ICD-10-CM

## 2020-03-14 DIAGNOSIS — K651 Peritoneal abscess: Secondary | ICD-10-CM | POA: Diagnosis not present

## 2020-03-14 DIAGNOSIS — N179 Acute kidney failure, unspecified: Secondary | ICD-10-CM

## 2020-03-14 DIAGNOSIS — J8410 Pulmonary fibrosis, unspecified: Secondary | ICD-10-CM

## 2020-03-14 DIAGNOSIS — M11239 Other chondrocalcinosis, unspecified wrist: Secondary | ICD-10-CM

## 2020-03-14 DIAGNOSIS — F0390 Unspecified dementia without behavioral disturbance: Secondary | ICD-10-CM

## 2020-03-14 DIAGNOSIS — Z794 Long term (current) use of insulin: Secondary | ICD-10-CM

## 2020-03-14 DIAGNOSIS — K219 Gastro-esophageal reflux disease without esophagitis: Secondary | ICD-10-CM

## 2020-03-14 MED ORDER — HYDROMORPHONE HCL 2 MG PO TABS: 1.0000 mg | ORAL_TABLET | Freq: Four times a day (QID) | ORAL | 0 refills | Status: DC | PRN

## 2020-03-14 NOTE — Progress Notes (Signed)
Provider:  Rexene Edison. Mariea Clonts, D.O., C.M.D. Location:  [place] Room Number: 110 Place of Service:  SNF (31)  PCP: [name], PA-C Patient Care Team: [name], PA-C as PCP - General (Family Medicine) System, Provider Not In  Extended Emergency Contact Information Primary Emergency Contact: [name],[name] Address: [place], [place] [address] [name] of [place] Phone: [phone] Mobile Phone: [phone] Relation: Daughter Secondary Emergency Contact: [name] Mobile Phone: [phone] Relation: Son  Code Status: FULL Goals of Care: Advanced Directive information Advanced Directives 03/15/2020  Does Patient Have a Medical Advance Directive? Yes  Type of Advance Directive Living will  Does patient want to make changes to medical advance directive? No - Patient declined  Copy of [place] in Chart? -  Would patient like information on creating a medical advance directive? -   Chief Complaint  Patient presents with  . New Admit To SNF    New Admit to [facility]    HPI: Patient is a 87 y.o. female seen today for admission to [facility] rehab status post hospitalization from January 24 to February 4 at [facility].  Has a past medical history significant for type 2 diabetes, gastroesophageal reflux disease, COPD, pulmonary fibrosis, hypothyroidism, chronic kidney disease stage III, back pain and depression.  Prior to her admission her daughter had noted that she had increased fatigue and she had been to visit her primary care physician due to abdominal pain and difficulty urinating.  A urine sample could not be obtained in the office and her blood pressure was in the 90s.  She was sent to the emergency department for further evaluation.  There she was hypotensive at 87/32, heart rate 71, respirations of 30, sats of 97% on room air.  Labs  revealed white count of 11 hemoglobin 11 and platelets of 331.  Her sodium was 133 potassium 5.1.  BUN was 21 creatinine 1.03.  Mag was low at 3.2.  Take acid was 1.5 LFTs and lipase were within normal range and urinalysis was negative.  CT of the abdomen revealed sigmoid diverticulitis with a 3.1 cm abscess.  As well as honeycomb changes in her lungs.  Chest x-ray showed diffuse coarse reticular interstitial opacities suspicious for fibrosis.    She was admitted for the acute sigmoid diverticulitis with abscess and sepsis, given IV antibiotics and placed on bowel rest.  General surgery was consulted and advised CT-guided drain by interventional radiology.  They felt her abscess was not amenable to percutaneous drainage.  She remained afebrile and WBC had trended down so no drain was placed.  Eventually transitioned to p.o. clindamycin and then oral Omnicef and Flagyl.    She was rehydrated with IV fluids for her hypotension.  Her Norvasc was discontinued.  Her stay was complicated by aspiration pneumonia.  ST evaluated her and placed her on a regular soft diet with thin liquids.  She did have some expiratory wheezing that improved with nebulizer treatments.  Oral antibiotics and proton pump inhibitor were recommended to be continued.  She was also treated with a short course of prednisone 20 mg for 5 days for COPD exacerbation, given Robinul and Tussionex for cough.    She had right knee pain treated with low-dose Dilaudid and fentanyl that were discontinued--she had gout of her wrist (question pseudogout) uric acid of 4.4 and negative rheumatoid  factor.  This also improved with prednisone.    Her acute kidney injury and hypokalemia also improved with her rehydration and treatment of her infections.    CBGs were controlled with Lantus 5 units and a sliding scale.    PT evaluated her and recommended she come to skilled nursing facility due to weakness.  When seen yesterday by our NP she was alert to  self and year only not place and did not follow commands.  She was able to express some of her needs.  She was denying abdominal pain.  She has a Foley present on admission.  Previously her appetite was fair and she remained on a regular diet with thin liquids.  She was dependent on caregivers for her daily activities.  She actually sustained a fall on February 6 where she was found sitting on the floor.  She had no apparent injuries and vitals and neuro checks remained stable.    When I saw her today, she opened her eyes for a moment and closed them.  She did deny abdominal pain, but expressed she was very tired.  She said she wanted to sleep.    Past Medical History:  Diagnosis Date  . Arthritis   . Cataract   . Chronic cough   . COPD (chronic obstructive pulmonary disease) ([place])   . Diabetes mellitus without complication ([place])   . Diverticulitis   . Diverticulosis   . GERD (gastroesophageal reflux disease)   . Hepatic steatosis   . Hiatal hernia   . Hypothyroidism   . ILD (interstitial lung disease) ([place])   . Pulmonary fibrosis ([place])   . Thyroid disease   . Vertigo    Past Surgical History:  Procedure Laterality Date  . APPENDECTOMY    . INTRAOCULAR LENS INSERTION  1997  . REPLACEMENT TOTAL KNEE BILATERAL    . SHOULDER SURGERY Right    tendonitis    Social History   Socioeconomic History  . Marital status: Widowed    Spouse name: Not on file  . Number of children: 2  . Years of education: Not on file  . Highest education level: Not on file  Occupational History  . Occupation: retired  Tobacco Use  . Smoking status: Never Smoker  . Smokeless tobacco: Never Used  Vaping Use  . Vaping Use: Never used  Substance and Sexual Activity  . Alcohol use: No  . Drug use: No  . Sexual activity: Not Currently    Birth control/protection: None  Other Topics Concern  . Not on file  Social History Narrative  . Not on file   Social Determinants of Health   Financial  Resource Strain: Not on file  Food Insecurity: Not on file  Transportation Needs: Not on file  Physical Activity: Not on file  Stress: Not on file  Social Connections: Not on file    reports that she has never smoked. She has never used smokeless tobacco. She reports that she does not drink alcohol and does not use drugs.  Functional Status Survey:    Family History  Problem Relation Age of Onset  . Diabetes Mother   . Kidney cancer Mother   . Hypertension Father   . Supraventricular tachycardia Father   . Skin cancer Father   . Diabetes Sister     Health Maintenance  Topic Date Due  . FOOT EXAM  Never done  . OPHTHALMOLOGY EXAM  Never done  . URINE MICROALBUMIN  Never done  . TETANUS/TDAP  Never done  . DEXA SCAN  Never done  . HEMOGLOBIN A1C  08/29/2020  . INFLUENZA VACCINE  Completed  . COVID-19 Vaccine  Completed  . PNA vac Low Risk Adult  Completed    Allergies  Allergen Reactions  . Oxycodone-Acetaminophen Nausea Only  . Penicillins Hives and Rash    Has patient had a PCN reaction causing immediate rash, facial/tongue/throat swelling, SOB or lightheadedness with hypotension: no Has patient had a PCN reaction causing severe rash involving mucus membranes or skin necrosis: unknown Has patient had a PCN reaction that required hospitalization : unknown Has patient had a PCN reaction occurring within the last 10 years: no If all of the above answers are "NO", then may proceed with Cephalosporin use.   . Codeine     Nausea and vomiting  . Percocet [Oxycodone-Acetaminophen] Nausea And Vomiting  . Tramadol Nausea And Vomiting    Outpatient Encounter Medications as of 03/14/2020  Medication Sig  . acetaminophen (TYLENOL) 500 MG tablet Take 1,000 mg by mouth in the morning and at bedtime.  Marland Kitchen albuterol (PROVENTIL HFA;VENTOLIN HFA) 108 (90 Base) MCG/ACT inhaler Inhale 1 puff into the lungs every 6 (six) hours as needed for wheezing or shortness of breath.  Marland Kitchen ammonium  lactate (LAC-HYDRIN) 12 % lotion Apply 1 application topically daily as needed for dry skin.   . Ascorbic Acid (VITAMIN C) 1000 MG tablet Take 1,000 mg by mouth daily.  . Cholecalciferol (VITAMIN D-3) 25 MCG (1000 UT) CAPS Take 2,000 Units by mouth daily.  . diclofenac (FLECTOR) 1.3 % PTCH Place 1 patch onto the skin 2 (two) times daily. Right wrist/hand twice daily for pain  . diphenoxylate-atropine (LOMOTIL) 2.5-0.025 MG tablet Take 2 tablets by mouth 4 (four) times daily as needed for diarrhea or loose stools. diarrhea  . feeding supplement (ENSURE ENLIVE / ENSURE PLUS) LIQD Take 237 mLs by mouth 2 (two) times daily between meals.  Marland Kitchen glycopyrrolate (ROBINUL) 2 MG tablet Take 1 tablet (2 mg total) by mouth in the morning.  Marland Kitchen levothyroxine (SYNTHROID) 75 MCG tablet Take 1 tablet (75 mcg total) by mouth daily before breakfast.  . metFORMIN (GLUCOPHAGE) 500 MG tablet Take 1 tablet by mouth daily.  . Multiple Vitamin (MULTIVITAMIN WITH MINERALS) TABS tablet Take 1 tablet by mouth daily.  . Naphazoline-Glycerin (CLEAR EYES REDNESS RELIEF OP) Place 2 drops into the right eye 4 (four) times daily as needed. For Eye Irritation  . omeprazole (PRILOSEC) 40 MG capsule Take 1 capsule (40 mg total) by mouth daily.  . ondansetron (ZOFRAN) 4 MG tablet Take 4 mg by mouth every 6 (six) hours as needed for nausea or vomiting.  . polyethylene glycol (MIRALAX / GLYCOLAX) 17 g packet Take 17 g by mouth daily.  Marland Kitchen senna-docusate (SENOKOT-S) 8.6-50 MG tablet Take 1 tablet by mouth 2 (two) times daily.  . traZODone (DESYREL) 50 MG tablet Take 25 mg by mouth at bedtime as needed for sleep.  Marland Kitchen Zinc 50 MG CAPS Take 50 mg by mouth daily.  . [DISCONTINUED] chlorpheniramine-HYDROcodone (TUSSIONEX) 10-8 MG/5ML SUER Take 5 mLs by mouth every 12 (twelve) hours as needed for cough.  . [DISCONTINUED] HYDROmorphone (DILAUDID) 2 MG tablet Take 0.5 tablets (1 mg total) by mouth every 6 (six) hours as needed for moderate pain or severe  pain.  . [DISCONTINUED] ipratropium-albuterol (DUONEB) 0.5-2.5 (3) MG/3ML SOLN Take 3 mLs by nebulization 2 (two) times daily for 5 days.  . [DISCONTINUED] HYDROmorphone (DILAUDID) 2 MG tablet Take 0.5 tablets (  1 mg total) by mouth every 3 (three) hours as needed for moderate pain.   No facility-administered encounter medications on file as of 03/14/2020.    Review of Systems  Unable to perform ROS: Other (pt not participating in visit fully--wanting to sleep)    Vitals:   03/14/20 1156  BP: (!) 106/58  Pulse: 74  Temp: 97.7 F (36.5 C)  Weight: 153 lb 7 oz (69.6 kg)  Height: 5' 4"  (1.626 m)   Body mass index is 26.34 kg/m. Physical Exam Vitals reviewed.  Constitutional:      General: She is not in acute distress.    Appearance: She is ill-appearing.  HENT:     Head: Normocephalic and atraumatic.     Right Ear: External ear normal.     Left Ear: External ear normal.     Nose: Nose normal.     Mouth/Throat:     Pharynx: Oropharynx is clear.  Eyes:     Conjunctiva/sclera: Conjunctivae normal.     Pupils: Pupils are equal, round, and reactive to light.  Cardiovascular:     Rate and Rhythm: Normal rate and regular rhythm.     Pulses: Normal pulses.     Heart sounds: Normal heart sounds.  Pulmonary:     Effort: Pulmonary effort is normal.     Breath sounds: Normal breath sounds. No wheezing, rhonchi or rales.  Abdominal:     General: Bowel sounds are normal. There is no distension.     Tenderness: There is no abdominal tenderness. There is no guarding or rebound.  Genitourinary:    Comments: Foley with dark yellow urine Musculoskeletal:     Right lower leg: No edema.     Left lower leg: No edema.     Comments: No current wrist pain or swelling  Lymphadenopathy:     Cervical: No cervical adenopathy.  Neurological:     Motor: Weakness present.     Comments: Oriented to person  Psychiatric:     Comments: Could not really assess--wanted to be left alone to rest      Labs reviewed: Basic Metabolic Panel: Recent Labs    03/08/20 0542 03/09/20 0454 03/10/20 0449  NA 138 137 139  K 4.3 3.9 4.9  CL 104 104 101  CO2 24 24 27   GLUCOSE 259* 220* 149*  BUN 19 20 16   CREATININE 0.64 0.72 0.71  CALCIUM 8.3* 8.3* 8.7*  MG  --  1.7 2.1   Liver Function Tests: Recent Labs    03/06/20 0610 03/07/20 0516 03/08/20 0542  AST 13* 16 26  ALT 11 11 11   ALKPHOS 42 45 59  BILITOT 0.4 0.4 0.5  PROT 5.3* 5.5* 5.4*  ALBUMIN 2.3* 2.3* 2.3*   Recent Labs    02/28/20 1543  LIPASE 17   No results for input(s): AMMONIA in the last 8760 hours. CBC: Recent Labs    03/07/20 0516 03/08/20 0542 03/09/20 0454  WBC 12.6* 10.9* 11.1*  NEUTROABS 9.4* 8.3* 7.2  HGB 10.1* 9.9* 9.9*  HCT 31.0* 30.3* 30.5*  MCV 101.6* 101.7* 100.7*  PLT 499* 500* 454*   Cardiac Enzymes: No results for input(s): CKTOTAL, CKMB, CKMBINDEX, TROPONINI in the last 8760 hours. BNP: Invalid input(s): POCBNP Lab Results  Component Value Date   HGBA1C 6.3 (H) 03/01/2020   Lab Results  Component Value Date   TSH 5.492 (H) 03/01/2020   No results found for: VITAMINB12 No results found for: FOLATE No results found for: IRON, TIBC, FERRITIN  Imaging and Procedures obtained prior to SNF admission: CT Abdomen Pelvis W Contrast  Result Date: 02/28/2020 CLINICAL DATA:  Acute abdominal pain. Difficulty urinating. Small amount of blood in the rectum. EXAM: CT ABDOMEN AND PELVIS WITH CONTRAST TECHNIQUE: Multidetector CT imaging of the abdomen and pelvis was performed using the standard protocol following bolus administration of intravenous contrast. CONTRAST:  166m OMNIPAQUE IOHEXOL 300 MG/ML  SOLN COMPARISON:  12/29/2019 FINDINGS: Lower chest: Mild cardiac enlargement. Coronary artery calcifications. Fibrosis and honeycomb changes in the lung bases. Small esophageal hiatal hernia. Hepatobiliary: No focal liver abnormality is seen. No gallstones, gallbladder wall thickening, or  biliary dilatation. Pancreas: Unremarkable. No pancreatic ductal dilatation or surrounding inflammatory changes. Spleen: Normal in size without focal abnormality. Adrenals/Urinary Tract: Adrenal glands are unremarkable. Kidneys are normal, without renal calculi, focal lesion, or hydronephrosis. Bladder is decompressed with a Foley catheter. Stomach/Bowel: Diverticulum off of the cardiac region of the stomach. Stomach is decompressed without wall thickening. Small bowel are mostly decompressed with scattered fluid. No wall thickening. Scattered stool throughout the colon without colonic distention. Diverticulosis of the sigmoid colon. The rectosigmoid junction demonstrates wall thickening and edema with adjacent stranding. 3.1 cm diameter cystic collection suggested along the lateral wall of the rectosigmoid junction. The appearance suggests focal acute diverticulitis with an abscess. An underlying mass lesion could also have this appearance and should be excluded in follow-up. Vascular/Lymphatic: Aortic atherosclerosis. No enlarged abdominal or pelvic lymph nodes. Reproductive: Uterus and bilateral adnexa are unremarkable. Other: No abdominal wall hernia or abnormality. No abdominopelvic ascites. Musculoskeletal: Degenerative changes in the spine and hips. IMPRESSION: 1. Diverticulosis of the sigmoid colon with wall thickening, edema and adjacent stranding. 3.1 cm diameter cystic collection suggested along the lateral wall of the rectosigmoid junction. The appearance suggests focal acute diverticulitis with an abscess. An underlying mass lesion could also have this appearance and should be excluded in follow-up. 2. Small esophageal hiatal hernia. 3. Fibrosis and honeycomb changes in the lung bases. 4. Aortic atherosclerosis. 5. Foley catheter in the bladder. 6. Diverticulum off of the cardiac region of the stomach. 7. Degenerative changes in the spine and hips. Aortic Atherosclerosis (ICD10-I70.0). Electronically  Signed   By: WLucienne CapersM.D.   On: 02/28/2020 21:50   DG Chest Port 1 View  Result Date: 02/28/2020 CLINICAL DATA:  Oxygen desaturation EXAM: PORTABLE CHEST 1 VIEW COMPARISON:  05/24/2016 FINDINGS: Diffuse coarse reticular interstitial opacities suspicious for fibrosis. No consolidation or effusion. Stable cardiomediastinal silhouette with aortic atherosclerosis. No pneumothorax IMPRESSION: Diffuse coarse reticular interstitial opacities suspicious for pulmonary fibrosis. No definite acute airspace disease. Electronically Signed   By: KDonavan FoilM.D.   On: 02/28/2020 23:18    Assessment/Plan 1. Sigmoid diverticulitis -treated with abx and fluids  2. Intra-abdominal abscess ([place] -not amenable to percutaneous drain per IR so treated with fluids and abx, not having abdominal pain at this time and labs and vitals improved from then though she overall is not doing well  3. AKI (acute kidney injury) ([place] -improved with rehydration, monitor bmp  4. Pseudogout of wrist, unspecified laterality -s/p prednisone with improvement -should be weaned off pain meds and may be more alert  5. Controlled type 2 diabetes mellitus with complication, with long-term current use of insulin ([place] -back on home metformin, monitor bmp, if intake poor, may need to stop metformin long-term at her age and with her comorbidities Lab Results  Component Value Date   HGBA1C 6.3 (H) 03/01/2020    6. Pulmonary fibrosis ([place] -  noted on imaging, breathing appears to be doing fine at present s/p aspiration pneumonia  7. Gastroesophageal reflux disease without esophagitis -cont current regimen and monitor  8. Essential hypertension -bp at goal, in fact soft and off bp meds  9. Chronic kidney disease, stage II (mild) -Avoid nephrotoxic agents like nsaids, dose adjust renally excreted meds, hydrate.  10. Indwelling Foley catheter present -unclear from record why this got placed -will need voiding trial to  see if she can urinate on her own -if unable to d/c, may need urology f/u if she's having clinical improvement with therapy  11. Dementia without behavioral disturbance, unspecified dementia type ([place]) -seems she was having cognitive losses per history from family -seems she has had a dramatic decline now with her acute illness -palliative care to follow--monitor weight and function to see if she improves or may need hospice care   Family/ staff Communication: d/w snf nurse  Labs/tests ordered:  Cbc, bmp at one week, palliative care  [place]. Tavaughn Silguero, D.O. [facility] [address], [place] [address] Cell Phone (Mon-Fri 8am-5pm):  [phone] On Call:  [phone] & follow prompts after 5pm & weekends Office Phone:  [phone] Office Fax:  [phone]

## 2020-03-15 ENCOUNTER — Encounter: Payer: Self-pay | Admitting: Orthopedic Surgery

## 2020-03-15 ENCOUNTER — Non-Acute Institutional Stay (SKILLED_NURSING_FACILITY): Payer: Medicare Other | Admitting: Orthopedic Surgery

## 2020-03-15 DIAGNOSIS — R2231 Localized swelling, mass and lump, right upper limb: Secondary | ICD-10-CM

## 2020-03-15 NOTE — Progress Notes (Signed)
Location:    [facility] Room Number: 110/P Place of Service:  SNF [phone]) Provider: Windell Moulding NP   [name], PA-C  Patient Care Team: [name], PA-C as PCP - General (Family Medicine) System, Provider Not In  Extended Emergency Contact Information Primary Emergency Contact: [name],[name] Address: [place], [place] [address] [name] of [place] Phone: [phone] Mobile Phone: [phone] Relation: Daughter Secondary Emergency Contact: [name] Phone: [phone] Relation: Son  Code Status:  Full Code Goals of care: Advanced Directive information Advanced Directives 03/15/2020  Does Patient Have a Medical Advance Directive? Yes  Type of Advance Directive Living will  Does patient want to make changes to medical advance directive? No - Patient declined  Copy of [place] in Chart? -  Would patient like information on creating a medical advance directive? -     Chief Complaint  Patient presents with  . Acute Visit    Right Hand Edema    HPI:  Pt is a 87 y.o. female seen today for an acute visit for    Past Medical History:  Diagnosis Date  . Arthritis   . Cataract   . Chronic cough   . COPD (chronic obstructive pulmonary disease) ([place])   . Diabetes mellitus without complication ([place])   . Diverticulitis   . Diverticulosis   . GERD (gastroesophageal reflux disease)   . Hepatic steatosis   . Hiatal hernia   . Hypothyroidism   . ILD (interstitial lung disease) ([place])   . Pulmonary fibrosis ([place])   . Thyroid disease   . Vertigo    Past Surgical History:  Procedure Laterality Date  . APPENDECTOMY    . INTRAOCULAR LENS INSERTION  1997  . REPLACEMENT TOTAL KNEE BILATERAL    . SHOULDER SURGERY Right    tendonitis    Allergies  Allergen Reactions  . Oxycodone-Acetaminophen Nausea Only  . Penicillins Hives and Rash    Has  patient had a PCN reaction causing immediate rash, facial/tongue/throat swelling, SOB or lightheadedness with hypotension: no Has patient had a PCN reaction causing severe rash involving mucus membranes or skin necrosis: unknown Has patient had a PCN reaction that required hospitalization : unknown Has patient had a PCN reaction occurring within the last 10 years: no If all of the above answers are "NO", then may proceed with Cephalosporin use.   . Codeine     Nausea and vomiting  . Percocet [Oxycodone-Acetaminophen] Nausea And Vomiting  . Tramadol Nausea And Vomiting    Allergies as of 03/15/2020      Reactions   Oxycodone-acetaminophen Nausea Only   Penicillins Hives, Rash   Has patient had a PCN reaction causing immediate rash, facial/tongue/throat swelling, SOB or lightheadedness with hypotension: no Has patient had a PCN reaction causing severe rash involving mucus membranes or skin necrosis: unknown Has patient had a PCN reaction that required hospitalization : unknown Has patient had a PCN reaction occurring within the last 10 years: no If all of the above answers are "NO", then may proceed with Cephalosporin use.   Codeine    Nausea and vomiting   Percocet [oxycodone-acetaminophen] Nausea And Vomiting   Tramadol Nausea And Vomiting      Medication List       Accurate as of March 15, 2020 12:37 PM. If you have any questions, ask your nurse or doctor.  STOP taking these medications   chlorpheniramine-HYDROcodone 10-8 MG/5ML Suer Commonly known as: TUSSIONEX Stopped by: Yvonna Alanis, NP   ipratropium-albuterol 0.5-2.5 (3) MG/3ML Soln Commonly known as: DUONEB Stopped by: Yvonna Alanis, NP     TAKE these medications   acetaminophen 500 MG tablet Commonly known as: TYLENOL Take 1,000 mg by mouth in the morning and at bedtime.   albuterol 108 (90 Base) MCG/ACT inhaler Commonly known as: VENTOLIN HFA Inhale 1 puff into the lungs every 6 (six) hours as needed for  wheezing or shortness of breath.   ammonium lactate 12 % lotion Commonly known as: LAC-HYDRIN Apply 1 application topically daily as needed for dry skin.   CLEAR EYES REDNESS RELIEF OP Place 2 drops into the right eye 4 (four) times daily as needed. For Eye Irritation   diclofenac 1.3 % Ptch Commonly known as: FLECTOR Place 1 patch onto the skin 2 (two) times daily. Right wrist/hand twice daily for pain   diphenoxylate-atropine 2.5-0.025 MG tablet Commonly known as: LOMOTIL Take 2 tablets by mouth 4 (four) times daily as needed for diarrhea or loose stools. diarrhea   feeding supplement Liqd Take 237 mLs by mouth 2 (two) times daily between meals.   glycopyrrolate 2 MG tablet Commonly known as: ROBINUL Take 1 tablet (2 mg total) by mouth in the morning.   HYDROCODONE-CHLORPHENIRAMINE PO Take 5 mLs by mouth every 12 (twelve) hours as needed. For Cough for 14 days   HYDROmorphone 2 MG tablet Commonly known as: DILAUDID Take 1 mg by mouth every 3 (three) hours as needed for severe pain. For 7 days What changed: Another medication with the same name was removed. Continue taking this medication, and follow the directions you see here. Changed by: Yvonna Alanis, NP   levothyroxine 75 MCG tablet Commonly known as: SYNTHROID Take 1 tablet (75 mcg total) by mouth daily before breakfast.   metFORMIN 500 MG tablet Commonly known as: GLUCOPHAGE Take 1 tablet by mouth daily.   multivitamin with minerals Tabs tablet Take 1 tablet by mouth daily.   omeprazole 40 MG capsule Commonly known as: PRILOSEC Take 1 capsule (40 mg total) by mouth daily.   ondansetron 4 MG tablet Commonly known as: ZOFRAN Take 4 mg by mouth every 6 (six) hours as needed for nausea or vomiting.   OXYGEN Inhale 2 L into the lungs as needed. To maintain O2 saturation of 90%  ( Shortness of Breath)   polyethylene glycol 17 g packet Commonly known as: MIRALAX / GLYCOLAX Take 17 g by mouth daily.    senna-docusate 8.6-50 MG tablet Commonly known as: Senokot-S Take 1 tablet by mouth 2 (two) times daily.   traZODone 50 MG tablet Commonly known as: DESYREL Take 25 mg by mouth at bedtime as needed for sleep.   vitamin C 1000 MG tablet Take 1,000 mg by mouth daily.   Vitamin D-3 25 MCG (1000 UT) Caps Take 2,000 Units by mouth daily.   Zinc 50 MG Caps Take 50 mg by mouth daily.       Review of Systems  Immunization History  Administered Date(s) Administered  . DT (Pediatric) 02/04/1978, 02/05/1988  . Influenza Split 11/03/2018  . Influenza Whole 12/09/2006  . Influenza, High Dose Seasonal PF 01/24/2017, 01/08/2018  . Influenza, Seasonal, Injecte, Preservative Fre 11/07/2009, 11/30/2010, 11/25/2011  . Influenza,inj,Quad PF,6+ Mos 12/22/2012, 01/20/2014, 11/19/2019  . Influenza-Unspecified 11/13/2015  . PFIZER(Purple Top)SARS-COV-2 Vaccination 03/31/2019, 04/28/2019, 11/15/2019  . Pneumococcal Conjugate-13 06/24/2013  . Pneumococcal  Polysaccharide-23 08/08/2017, 10/27/2018  . Pneumococcal-Unspecified 11/25/2000   Pertinent  Health Maintenance Due  Topic Date Due  . FOOT EXAM  Never done  . OPHTHALMOLOGY EXAM  Never done  . URINE MICROALBUMIN  Never done  . DEXA SCAN  Never done  . HEMOGLOBIN A1C  08/29/2020  . INFLUENZA VACCINE  Completed  . PNA vac Low Risk Adult  Completed   No flowsheet data found. Functional Status Survey:    Vitals:   03/15/20 1211  BP: 126/75  Pulse: 68  Resp: 18  Temp: 98.1 F (36.7 C)  SpO2: 94%  Weight: 153 lb 7 oz (69.6 kg)  Height: 5' 4"  (1.626 m)   Body mass index is 26.34 kg/m. Physical Exam  Labs reviewed: Recent Labs    03/08/20 0542 03/09/20 0454 03/10/20 0449  NA 138 137 139  K 4.3 3.9 4.9  CL 104 104 101  CO2 24 24 27   GLUCOSE 259* 220* 149*  BUN 19 20 16   CREATININE 0.64 0.72 0.71  CALCIUM 8.3* 8.3* 8.7*  MG  --  1.7 2.1   Recent Labs    03/06/20 0610 03/07/20 0516 03/08/20 0542  AST 13* 16 26   ALT 11 11 11   ALKPHOS 42 45 59  BILITOT 0.4 0.4 0.5  PROT 5.3* 5.5* 5.4*  ALBUMIN 2.3* 2.3* 2.3*   Recent Labs    03/07/20 0516 03/08/20 0542 03/09/20 0454  WBC 12.6* 10.9* 11.1*  NEUTROABS 9.4* 8.3* 7.2  HGB 10.1* 9.9* 9.9*  HCT 31.0* 30.3* 30.5*  MCV 101.6* 101.7* 100.7*  PLT 499* 500* 454*   Lab Results  Component Value Date   TSH 5.492 (H) 03/01/2020   Lab Results  Component Value Date   HGBA1C 6.3 (H) 03/01/2020   No results found for: CHOL, HDL, LDLCALC, LDLDIRECT, TRIG, CHOLHDL  Significant Diagnostic Results in last 30 days:  DG Chest 1 View  Result Date: 03/06/2020 CLINICAL DATA:  Pneumonia EXAM: CHEST  1 VIEW COMPARISON:  Chest x-ray 02/28/2020 FINDINGS: The heart size and mediastinal contours are unchanged. Aortic arch calcification. Interval increase in retrocardiac opacity. Likely trace bilateral pleural effusion. Redemonstration of bilateral lower lung zone patchy airspace opacity. Redemonstration of coarsened and increased interstitial markings. Biapical pleural/pulmonary scarring. No pneumothorax. No acute osseous abnormality. IMPRESSION: 1. Multifocal pneumonia with worsened retrocardiac opacity. 2. Likely bilateral trace pleural effusions. Electronically Signed   By: Iven Finn M.D.   On: 03/06/2020 06:00   DG Cervical Spine 2 or 3 views  Result Date: 03/07/2020 CLINICAL DATA:  Cervical spine pain, worsening progressively. Severe pain limiting evaluation. EXAM: CERVICAL SPINE - 2-3 VIEW COMPARISON:  CT of the cervical spine from January of 2020 FINDINGS: Very limited examination perhaps due to patient discomfort and inability to position the patient in order to obtain adequate C-spine evaluation. Cervical spine imaged only through the midportion of C6 with marked degenerative changes noted particularly at C4-5 and C5-6. Calcification of the nuchal ligament with similar appearance. Visualized prevertebral soft tissues are unremarkable. Slight curvature to the  RIGHT on frontal radiograph apparently patient unable to straighten neck for the evaluation. There are coarse interstitial markings noted at the lung apices also displayed on recent chest radiograph. Examination is severely limited by limitations related to patient positioning. In the setting of worsening cervical spine pain CT may be helpful as standard radiographs may not allow for complete coverage particularly in the lateral view. IMPRESSION: 1. Limited evaluation of the cervical spine perhaps due to patient discomfort  and inability to position the patient in order to obtain adequate C-spine evaluation. CT may be helpful for further evaluation of the cervical spine. 2. Multilevel degenerative disease in visualized portions of the spine. 3. Coarse interstitial markings in the lung apices displayed on recent chest radiograph. Electronically Signed   By: Zetta Bills M.D.   On: 03/07/2020 15:54   DG Knee 1-2 Views Right  Result Date: 03/04/2020 CLINICAL DATA:  RIGHT knee pain post fall EXAM: RIGHT KNEE - 1-2 VIEW COMPARISON:  Portable exam 1405 hours without priors for comparison FINDINGS: Osseous demineralization. Components of a RIGHT knee prosthesis are identified. No acute fracture, dislocation, bone destruction or periprosthetic lucency. Small knee joint effusion. IMPRESSION: RIGHT knee prosthesis with small joint effusion. No acute osseous abnormalities. Electronically Signed   By: Lavonia Dana M.D.   On: 03/04/2020 14:17   CT Abdomen Pelvis W Contrast  Result Date: 02/28/2020 CLINICAL DATA:  Acute abdominal pain. Difficulty urinating. Small amount of blood in the rectum. EXAM: CT ABDOMEN AND PELVIS WITH CONTRAST TECHNIQUE: Multidetector CT imaging of the abdomen and pelvis was performed using the standard protocol following bolus administration of intravenous contrast. CONTRAST:  143m OMNIPAQUE IOHEXOL 300 MG/ML  SOLN COMPARISON:  12/29/2019 FINDINGS: Lower chest: Mild cardiac enlargement. Coronary  artery calcifications. Fibrosis and honeycomb changes in the lung bases. Small esophageal hiatal hernia. Hepatobiliary: No focal liver abnormality is seen. No gallstones, gallbladder wall thickening, or biliary dilatation. Pancreas: Unremarkable. No pancreatic ductal dilatation or surrounding inflammatory changes. Spleen: Normal in size without focal abnormality. Adrenals/Urinary Tract: Adrenal glands are unremarkable. Kidneys are normal, without renal calculi, focal lesion, or hydronephrosis. Bladder is decompressed with a Foley catheter. Stomach/Bowel: Diverticulum off of the cardiac region of the stomach. Stomach is decompressed without wall thickening. Small bowel are mostly decompressed with scattered fluid. No wall thickening. Scattered stool throughout the colon without colonic distention. Diverticulosis of the sigmoid colon. The rectosigmoid junction demonstrates wall thickening and edema with adjacent stranding. 3.1 cm diameter cystic collection suggested along the lateral wall of the rectosigmoid junction. The appearance suggests focal acute diverticulitis with an abscess. An underlying mass lesion could also have this appearance and should be excluded in follow-up. Vascular/Lymphatic: Aortic atherosclerosis. No enlarged abdominal or pelvic lymph nodes. Reproductive: Uterus and bilateral adnexa are unremarkable. Other: No abdominal wall hernia or abnormality. No abdominopelvic ascites. Musculoskeletal: Degenerative changes in the spine and hips. IMPRESSION: 1. Diverticulosis of the sigmoid colon with wall thickening, edema and adjacent stranding. 3.1 cm diameter cystic collection suggested along the lateral wall of the rectosigmoid junction. The appearance suggests focal acute diverticulitis with an abscess. An underlying mass lesion could also have this appearance and should be excluded in follow-up. 2. Small esophageal hiatal hernia. 3. Fibrosis and honeycomb changes in the lung bases. 4. Aortic  atherosclerosis. 5. Foley catheter in the bladder. 6. Diverticulum off of the cardiac region of the stomach. 7. Degenerative changes in the spine and hips. Aortic Atherosclerosis (ICD10-I70.0). Electronically Signed   By: WLucienne CapersM.D.   On: 02/28/2020 21:50   DG Chest Port 1 View  Result Date: 02/28/2020 CLINICAL DATA:  Oxygen desaturation EXAM: PORTABLE CHEST 1 VIEW COMPARISON:  05/24/2016 FINDINGS: Diffuse coarse reticular interstitial opacities suspicious for fibrosis. No consolidation or effusion. Stable cardiomediastinal silhouette with aortic atherosclerosis. No pneumothorax IMPRESSION: Diffuse coarse reticular interstitial opacities suspicious for pulmonary fibrosis. No definite acute airspace disease. Electronically Signed   By: KDonavan FoilM.D.   On: 02/28/2020 23:18  DG Humerus Right  Result Date: 03/09/2020 CLINICAL DATA:  Palpable abnormality of the right upper arm. No known recent injury. EXAM: RIGHT HUMERUS - 2+ VIEW COMPARISON:  X-ray 05/01/2004 FINDINGS: Previously seen displaced right humeral diaphyseal fracture is well healed. No acute fracture or dislocation. Degenerative changes of the shoulder and elbow. Soft tissues appear within normal limits. IMPRESSION: Well healed remote right humeral fracture. This may correspond to reported palpable abnormality. No acute osseous findings. Electronically Signed   By: Davina Poke D.O.   On: 03/09/2020 13:25    Assessment/Plan There are no diagnoses linked to this encounter.   Family/ staff Communication:   Labs/tests ordered:

## 2020-03-15 NOTE — Progress Notes (Signed)
Location:  [facility] Room Number: 110/P Place of Service:  SNF [phone]) Provider:  Windell Moulding, AGNP-C  [name], PA-C  Patient Care Team: [name], PA-C as PCP - General (Family Medicine) System, Provider Not In  Extended Emergency Contact Information Primary Emergency Contact: [name],[name] Address: [place], [place] [address] [name] of [place] Phone: [phone] Mobile Phone: [phone] Relation: Daughter Secondary Emergency Contact: [name] Mobile Phone: [phone] Relation: Son  Goals of care: Advanced Directive information Advanced Directives 03/14/2020  Does Patient Have a Medical Advance Directive? No  Type of Advance Directive Healthcare Power of Attorney  Does patient want to make changes to medical advance directive? No - Patient declined  Copy of [place] in Chart? Yes - validated most recent copy scanned in chart (See row information)  Would patient like information on creating a medical advance directive? -     Chief Complaint  Patient presents with  . Acute Visit    Right Hand Edema    HPI:  Pt is a 87 y.o. female seen today for an acute visit for right arm edema.   Facility nurse reports right wrist and dorsal hand appear more swollen today. She was recently hospitalized at [facility] for diverticulitis. During her stay, her right wrist was also noted to be painful, though to be pseudogout. Uric acid level 4.4. She was given a prednisone dose pack for her COPD, and her wrist pain and swelling resolved. Last dose of 10 mg given 02/07.   Today, she remains nonverbal. She guards right wrist when I try to move her hand.   Facility nurse does not report any other concerns.   Past Medical History:  Diagnosis Date  . Arthritis   . Cataract   . Chronic cough   . COPD (chronic obstructive pulmonary disease) ([place])   . Diabetes  mellitus without complication ([place])   . Diverticulitis   . Diverticulosis   . GERD (gastroesophageal reflux disease)   . Hepatic steatosis   . Hiatal hernia   . Hypothyroidism   . ILD (interstitial lung disease) ([place])   . Pulmonary fibrosis ([place])   . Thyroid disease   . Vertigo    Past Surgical History:  Procedure Laterality Date  . APPENDECTOMY    . INTRAOCULAR LENS INSERTION  1997  . REPLACEMENT TOTAL KNEE BILATERAL    . SHOULDER SURGERY Right    tendonitis    Allergies  Allergen Reactions  . Oxycodone-Acetaminophen Nausea Only  . Penicillins Hives and Rash    Has patient had a PCN reaction causing immediate rash, facial/tongue/throat swelling, SOB or lightheadedness with hypotension: no Has patient had a PCN reaction causing severe rash involving mucus membranes or skin necrosis: unknown Has patient had a PCN reaction that required hospitalization : unknown Has patient had a PCN reaction occurring within the last 10 years: no If all of the above answers are "NO", then may proceed with Cephalosporin use.   . Codeine     Nausea and vomiting  . Percocet [Oxycodone-Acetaminophen] Nausea And Vomiting  . Tramadol Nausea And Vomiting    Outpatient Encounter Medications as of 03/15/2020  Medication Sig  . acetaminophen (TYLENOL) 500 MG tablet Take 500-1,000 mg by mouth in the morning and at bedtime.  Marland Kitchen albuterol (PROVENTIL HFA;VENTOLIN HFA) 108 (90 Base) MCG/ACT inhaler Inhale 1 puff into the lungs every 6 (  six) hours as needed for wheezing or shortness of breath.  Marland Kitchen ammonium lactate (LAC-HYDRIN) 12 % lotion Apply 1 application topically daily as needed for dry skin.   . Ascorbic Acid (VITAMIN C) 1000 MG tablet Take 1,000 mg by mouth daily.  . chlorpheniramine-HYDROcodone (TUSSIONEX) 10-8 MG/5ML SUER Take 5 mLs by mouth every 12 (twelve) hours as needed for cough.  . Cholecalciferol (VITAMIN D-3) 25 MCG (1000 UT) CAPS Take 2,000 Units by mouth daily.  . diclofenac (FLECTOR) 1.3  % PTCH Place 1 patch onto the skin 2 (two) times daily. Right wrist/hand twice daily for pain  . diphenoxylate-atropine (LOMOTIL) 2.5-0.025 MG tablet Take 2 tablets by mouth 4 (four) times daily as needed for diarrhea or loose stools. diarrhea  . feeding supplement (ENSURE ENLIVE / ENSURE PLUS) LIQD Take 237 mLs by mouth 2 (two) times daily between meals.  Marland Kitchen glycopyrrolate (ROBINUL) 2 MG tablet Take 1 tablet (2 mg total) by mouth in the morning.  Marland Kitchen ipratropium-albuterol (DUONEB) 0.5-2.5 (3) MG/3ML SOLN Take 3 mLs by nebulization 2 (two) times daily for 5 days.  Marland Kitchen levothyroxine (SYNTHROID) 75 MCG tablet Take 1 tablet (75 mcg total) by mouth daily before breakfast.  . metFORMIN (GLUCOPHAGE) 500 MG tablet Take 1 tablet by mouth daily.  . Multiple Vitamin (MULTIVITAMIN WITH MINERALS) TABS tablet Take 1 tablet by mouth daily.  . Naphazoline-Glycerin (CLEAR EYES REDNESS RELIEF OP) Apply 2 drops to eye 4 (four) times daily as needed.  Marland Kitchen omeprazole (PRILOSEC) 40 MG capsule Take 1 capsule (40 mg total) by mouth daily.  . ondansetron (ZOFRAN) 4 MG tablet Take 4 mg by mouth every 6 (six) hours as needed for nausea or vomiting.  . polyethylene glycol (MIRALAX / GLYCOLAX) 17 g packet Take 17 g by mouth daily.  Marland Kitchen senna-docusate (SENOKOT-S) 8.6-50 MG tablet Take 1 tablet by mouth 2 (two) times daily.  . traZODone (DESYREL) 50 MG tablet Take 25 mg by mouth at bedtime as needed for sleep.  Marland Kitchen Zinc 50 MG CAPS Take 50 mg by mouth daily.  . [DISCONTINUED] HYDROmorphone (DILAUDID) 2 MG tablet Take 0.5 tablets (1 mg total) by mouth every 6 (six) hours as needed for moderate pain or severe pain.   No facility-administered encounter medications on file as of 03/15/2020.    Review of Systems  Unable to perform ROS: Patient nonverbal    Immunization History  Administered Date(s) Administered  . DT (Pediatric) 02/04/1978, 02/05/1988  . Influenza Split 11/03/2018  . Influenza Whole 12/09/2006  . Influenza, High Dose  Seasonal PF 01/24/2017, 01/08/2018  . Influenza, Seasonal, Injecte, Preservative Fre 11/07/2009, 11/30/2010, 11/25/2011  . Influenza,inj,Quad PF,6+ Mos 12/22/2012, 01/20/2014, 11/19/2019  . Influenza-Unspecified 11/13/2015  . PFIZER(Purple Top)SARS-COV-2 Vaccination 03/31/2019, 04/28/2019, 11/15/2019  . Pneumococcal Conjugate-13 06/24/2013  . Pneumococcal Polysaccharide-23 08/08/2017, 10/27/2018  . Pneumococcal-Unspecified 11/25/2000   Pertinent  Health Maintenance Due  Topic Date Due  . FOOT EXAM  Never done  . OPHTHALMOLOGY EXAM  Never done  . URINE MICROALBUMIN  Never done  . DEXA SCAN  Never done  . HEMOGLOBIN A1C  08/29/2020  . INFLUENZA VACCINE  Completed  . PNA vac Low Risk Adult  Completed   No flowsheet data found. Functional Status Survey:    Vitals:   03/15/20 1211  BP: 126/75  Pulse: 68  Resp: 18  Temp: 98.1 F (36.7 C)  SpO2: 94%  Weight: 153 lb 7 oz (69.6 kg)  Height: 5' 4"  (1.626 m)   Body mass index is 26.34  kg/m. Physical Exam Vitals reviewed.  Constitutional:      General: She is not in acute distress. Cardiovascular:     Rate and Rhythm: Normal rate and regular rhythm.     Pulses: Normal pulses.     Heart sounds: Normal heart sounds. No murmur heard.   Pulmonary:     Effort: Pulmonary effort is normal. No respiratory distress.     Breath sounds: Normal breath sounds. No wheezing.  Musculoskeletal:     Comments: Right dorsal hand and wrist swollen, tender to touch, no erythema or warmth. No skin breakdown. Patient guarding arm during exam.   Skin:    General: Skin is warm and dry.     Capillary Refill: Capillary refill takes less than 2 seconds.  Neurological:     General: No focal deficit present.     Mental Status: She is alert. Mental status is at baseline.     Motor: Weakness present.     Gait: Gait abnormal.     Comments: wheelchair  Psychiatric:        Attention and Perception: Attention normal.        Mood and Affect: Affect is  flat.        Cognition and Memory: Memory is impaired.     Labs reviewed: Recent Labs    03/08/20 0542 03/09/20 0454 03/10/20 0449  NA 138 137 139  K 4.3 3.9 4.9  CL 104 104 101  CO2 24 24 27   GLUCOSE 259* 220* 149*  BUN 19 20 16   CREATININE 0.64 0.72 0.71  CALCIUM 8.3* 8.3* 8.7*  MG  --  1.7 2.1   Recent Labs    03/06/20 0610 03/07/20 0516 03/08/20 0542  AST 13* 16 26  ALT 11 11 11   ALKPHOS 42 45 59  BILITOT 0.4 0.4 0.5  PROT 5.3* 5.5* 5.4*  ALBUMIN 2.3* 2.3* 2.3*   Recent Labs    03/07/20 0516 03/08/20 0542 03/09/20 0454  WBC 12.6* 10.9* 11.1*  NEUTROABS 9.4* 8.3* 7.2  HGB 10.1* 9.9* 9.9*  HCT 31.0* 30.3* 30.5*  MCV 101.6* 101.7* 100.7*  PLT 499* 500* 454*   Lab Results  Component Value Date   TSH 5.492 (H) 03/01/2020   Lab Results  Component Value Date   HGBA1C 6.3 (H) 03/01/2020   No results found for: CHOL, HDL, LDLCALC, LDLDIRECT, TRIG, CHOLHDL  Significant Diagnostic Results in last 30 days:  DG Chest 1 View  Result Date: 03/06/2020 CLINICAL DATA:  Pneumonia EXAM: CHEST  1 VIEW COMPARISON:  Chest x-ray 02/28/2020 FINDINGS: The heart size and mediastinal contours are unchanged. Aortic arch calcification. Interval increase in retrocardiac opacity. Likely trace bilateral pleural effusion. Redemonstration of bilateral lower lung zone patchy airspace opacity. Redemonstration of coarsened and increased interstitial markings. Biapical pleural/pulmonary scarring. No pneumothorax. No acute osseous abnormality. IMPRESSION: 1. Multifocal pneumonia with worsened retrocardiac opacity. 2. Likely bilateral trace pleural effusions. Electronically Signed   By: Iven Finn M.D.   On: 03/06/2020 06:00   DG Cervical Spine 2 or 3 views  Result Date: 03/07/2020 CLINICAL DATA:  Cervical spine pain, worsening progressively. Severe pain limiting evaluation. EXAM: CERVICAL SPINE - 2-3 VIEW COMPARISON:  CT of the cervical spine from January of 2020 FINDINGS: Very limited  examination perhaps due to patient discomfort and inability to position the patient in order to obtain adequate C-spine evaluation. Cervical spine imaged only through the midportion of C6 with marked degenerative changes noted particularly at C4-5 and C5-6. Calcification of the nuchal  ligament with similar appearance. Visualized prevertebral soft tissues are unremarkable. Slight curvature to the RIGHT on frontal radiograph apparently patient unable to straighten neck for the evaluation. There are coarse interstitial markings noted at the lung apices also displayed on recent chest radiograph. Examination is severely limited by limitations related to patient positioning. In the setting of worsening cervical spine pain CT may be helpful as standard radiographs may not allow for complete coverage particularly in the lateral view. IMPRESSION: 1. Limited evaluation of the cervical spine perhaps due to patient discomfort and inability to position the patient in order to obtain adequate C-spine evaluation. CT may be helpful for further evaluation of the cervical spine. 2. Multilevel degenerative disease in visualized portions of the spine. 3. Coarse interstitial markings in the lung apices displayed on recent chest radiograph. Electronically Signed   By: Zetta Bills M.D.   On: 03/07/2020 15:54   DG Knee 1-2 Views Right  Result Date: 03/04/2020 CLINICAL DATA:  RIGHT knee pain post fall EXAM: RIGHT KNEE - 1-2 VIEW COMPARISON:  Portable exam 1405 hours without priors for comparison FINDINGS: Osseous demineralization. Components of a RIGHT knee prosthesis are identified. No acute fracture, dislocation, bone destruction or periprosthetic lucency. Small knee joint effusion. IMPRESSION: RIGHT knee prosthesis with small joint effusion. No acute osseous abnormalities. Electronically Signed   By: Lavonia Dana M.D.   On: 03/04/2020 14:17   CT Abdomen Pelvis W Contrast  Result Date: 02/28/2020 CLINICAL DATA:  Acute abdominal  pain. Difficulty urinating. Small amount of blood in the rectum. EXAM: CT ABDOMEN AND PELVIS WITH CONTRAST TECHNIQUE: Multidetector CT imaging of the abdomen and pelvis was performed using the standard protocol following bolus administration of intravenous contrast. CONTRAST:  125m OMNIPAQUE IOHEXOL 300 MG/ML  SOLN COMPARISON:  12/29/2019 FINDINGS: Lower chest: Mild cardiac enlargement. Coronary artery calcifications. Fibrosis and honeycomb changes in the lung bases. Small esophageal hiatal hernia. Hepatobiliary: No focal liver abnormality is seen. No gallstones, gallbladder wall thickening, or biliary dilatation. Pancreas: Unremarkable. No pancreatic ductal dilatation or surrounding inflammatory changes. Spleen: Normal in size without focal abnormality. Adrenals/Urinary Tract: Adrenal glands are unremarkable. Kidneys are normal, without renal calculi, focal lesion, or hydronephrosis. Bladder is decompressed with a Foley catheter. Stomach/Bowel: Diverticulum off of the cardiac region of the stomach. Stomach is decompressed without wall thickening. Small bowel are mostly decompressed with scattered fluid. No wall thickening. Scattered stool throughout the colon without colonic distention. Diverticulosis of the sigmoid colon. The rectosigmoid junction demonstrates wall thickening and edema with adjacent stranding. 3.1 cm diameter cystic collection suggested along the lateral wall of the rectosigmoid junction. The appearance suggests focal acute diverticulitis with an abscess. An underlying mass lesion could also have this appearance and should be excluded in follow-up. Vascular/Lymphatic: Aortic atherosclerosis. No enlarged abdominal or pelvic lymph nodes. Reproductive: Uterus and bilateral adnexa are unremarkable. Other: No abdominal wall hernia or abnormality. No abdominopelvic ascites. Musculoskeletal: Degenerative changes in the spine and hips. IMPRESSION: 1. Diverticulosis of the sigmoid colon with wall  thickening, edema and adjacent stranding. 3.1 cm diameter cystic collection suggested along the lateral wall of the rectosigmoid junction. The appearance suggests focal acute diverticulitis with an abscess. An underlying mass lesion could also have this appearance and should be excluded in follow-up. 2. Small esophageal hiatal hernia. 3. Fibrosis and honeycomb changes in the lung bases. 4. Aortic atherosclerosis. 5. Foley catheter in the bladder. 6. Diverticulum off of the cardiac region of the stomach. 7. Degenerative changes in the spine and hips. Aortic  Atherosclerosis (ICD10-I70.0). Electronically Signed   By: Lucienne Capers M.D.   On: 02/28/2020 21:50   DG Chest Port 1 View  Result Date: 02/28/2020 CLINICAL DATA:  Oxygen desaturation EXAM: PORTABLE CHEST 1 VIEW COMPARISON:  05/24/2016 FINDINGS: Diffuse coarse reticular interstitial opacities suspicious for fibrosis. No consolidation or effusion. Stable cardiomediastinal silhouette with aortic atherosclerosis. No pneumothorax IMPRESSION: Diffuse coarse reticular interstitial opacities suspicious for pulmonary fibrosis. No definite acute airspace disease. Electronically Signed   By: Donavan Foil M.D.   On: 02/28/2020 23:18   DG Humerus Right  Result Date: 03/09/2020 CLINICAL DATA:  Palpable abnormality of the right upper arm. No known recent injury. EXAM: RIGHT HUMERUS - 2+ VIEW COMPARISON:  X-ray 05/01/2004 FINDINGS: Previously seen displaced right humeral diaphyseal fracture is well healed. No acute fracture or dislocation. Degenerative changes of the shoulder and elbow. Soft tissues appear within normal limits. IMPRESSION: Well healed remote right humeral fracture. This may correspond to reported palpable abnormality. No acute osseous findings. Electronically Signed   By: Davina Poke D.O.   On: 03/09/2020 13:25    Assessment/Plan 1. Localized swelling on right hand - right dorsal hand swollen and tender- new since I last saw her 2/7 -  right wrist and hand pain noted past hospitalization, thought pseudogout - last uric acid 4.4 - will start prednisone 5 mg po daily x 5 days to help with swelling and pain - continue scheduled tylenol  - continue diclofenac patch to right hand/wrist   Family/ staff Communication: plan discussed with patient, daughter and facility nurse  Labs/tests ordered:  none

## 2020-03-24 ENCOUNTER — Non-Acute Institutional Stay: Payer: Self-pay | Admitting: Student

## 2020-03-24 ENCOUNTER — Other Ambulatory Visit: Payer: Self-pay

## 2020-03-24 DIAGNOSIS — Z515 Encounter for palliative care: Secondary | ICD-10-CM

## 2020-03-24 NOTE — Progress Notes (Signed)
[facility] Consult Note Telephone: [phone]  Fax: [phone]  PATIENT NAME: Shelia Black [address] [place] [address] [phone] (home)  DOB: 20-May-1921 MRN: [id number]  PRIMARY CARE PROVIDER:    Lenetta Quaker  [place] [address] [phone]  REFERRING PROVIDER:   [name], PA-C [place],  [place] [address] [phone]  RESPONSIBLE PARTY:   Extended Emergency Contact Information Primary Emergency Contact: Shelia Black Address: [place], [place] [address] [name] of [place] Phone: [phone] Mobile Phone: [phone] Relation: Daughter Secondary Emergency Contact: [name] [place] of [place] Phone: [phone] Relation: Son  I met face to face with patient and family in home/facility. Patient present but unable to substantively engage in process.   ASSESSMENT AND RECOMMENDATIONS:   Advance Care Planning: Visit at the request of Dr. Mariea Clonts for palliative consult. Visit consisted of building trust and discussions on Palliative care medicine as specialized medical care for people living with serious illness, aimed at facilitating improved quality of life through symptoms relief, assisting with advance care planning and establishing goals of care. Education provided on Palliative Medicine vs. Hospice services. Palliative Medicine will perform ongoing assessment to see if patient will improve with therapy, current plan of care. Discussed transitioning to Hospice services if she continues to decline. Palliative care will continue to provide support to patient, family and the medical team.  Goal of care: For patient to continue therapy and evaluate how she does. Family feels patient will need higher level of care and unable to return home.   Directives: Living Will, HCPOA, MOST form reviewed today.    Symptom Management:   Pain- Patient with c/o right wrist hand/pain-continue scheduled acetaminophen 1024m BID, diclofenac patch to right wrist/hand BID. Neck pain-continue acetaminophen as scheduled, recommend heat pack for up to 20 minutes BID prn, neck or cervical pillow for positioning while in bed.   Appetite/mood-patient with poor appetite. Recommend mirtazapine 7.555mPO daily.   Cognitive / Functional decline: Patient requires assist with all adl's. She is currently receiving therapy. Continue therapy as directed.   Follow up Palliative Care Visit: Palliative care will continue to follow for complex decision making and symptom management. Return in 1 week or prn.  Family /Caregiver/Community Supports: Palliative Medicine will continue to provide support to patient and family.    I spent 60 minutes providing this consultation, from 3:00pm to 4:00pm. Time includes time spent with patient/family, chart review, provider coordination, and documentation. More than 50% of the time in this consultation was spent counseling and coordinating communication.   CHIEF COMPLAINT: Palliative Medicine initial consult.  History obtained from review of EMR, discussion with primary team, and  interview with family. Records reviewed and summarized below.  HISTORY OF PRESENT ILLNESS:  Shelia Black a [age].o. year old female with multiple medical problems including COPD, interstitial lung disease, pulmonary fibrosis,  arthritis, diabetes, diverticulosis, diverticulitis, GERD, thyroid disease. Patient recently hospitalised 1/24-03/10/2020 due to diverticulitis, sepsis. Hospital stay complicated by aspiration pneumonia. Palliative Care was asked to follow this patient by consultation request of Dr. ReMariea Clontso help address advance care planning and goals of care. This is an initial visit.  [name] living [facility], not  oxygen dependent. Staff report patient participating in therapy. She will take her medications; poor appetite endorsed. Family  report patient "up and down." Some days she does better than others. Family also endorses a poor appetite and feel that patient is depressed, has little will to continue. She is ambulating short distances per family and staff. She has a foley catheter in place; she failed a voiding trial. She has a stage III sacral wound. She is sleeping more. No recent falls or injury. She recently completed prednisone course due to swelling and pain of right hand. Diclofenac patch being applied to right hand/wrist with relief. She also receives scheduled acetaminophen. Daughter Shelia Black reports neck pain, worse with positioning.  CODE STATUS: DNR  PPS: 40%  HOSPICE ELIGIBILITY/DIAGNOSIS: TBD  PHYSICAL EXAM / ROS:  Pulse 78, respirations 20, b/p 100/50, sats 98% on room air  Weight 128.2 pounds on 03/16/20. General: NAD, frail appearing, thin Cardiovascular: RRR, no chest pain reported, no edema  Pulmonary: LCTA,  no cough, no increased WOB, room air GI: Bowel sounds normoactive x 4; appetite poor GU: foley catheter in place, continent of urine MSK: neck pain, right hand/wrist pain Skin: stage III sacral wound reported Neurological: Weakness, but otherwise nonfocal Psych: non anxious affect  PAST MEDICAL HISTORY:  Past Medical History:  Diagnosis Date  . Arthritis   . Cataract   . Chronic cough   . COPD (chronic obstructive pulmonary disease) ([facility])   . Diabetes mellitus without complication ([facility])   . Diverticulitis   . Diverticulosis   . GERD (gastroesophageal reflux disease)   . Hepatic steatosis   . Hiatal hernia   . Hypothyroidism   . ILD (interstitial lung disease) ([facility])   . Pulmonary fibrosis ([facility])   . Thyroid disease   . Vertigo     SOCIAL HX:  Social History   Tobacco Use  . Smoking status: Never Smoker  . Smokeless tobacco: Never Used  Substance Use Topics   . Alcohol use: No   FAMILY HX:  Family History  Problem Relation Age of Onset  . Diabetes Mother   . Kidney cancer Mother   . Hypertension Father   . Supraventricular tachycardia Father   . Skin cancer Father   . Diabetes Sister     ALLERGIES:  Allergies  Allergen Reactions  . Oxycodone-Acetaminophen Nausea Only  . Penicillins Hives and Rash    Has patient had a PCN reaction causing immediate rash, facial/tongue/throat swelling, SOB or lightheadedness with hypotension: no Has patient had a PCN reaction causing severe rash involving mucus membranes or skin necrosis: unknown Has patient had a PCN reaction that required hospitalization : unknown Has patient had a PCN reaction occurring within the last 10 years: no If all of the above answers are "NO", then may proceed with Cephalosporin use.   . Codeine     Nausea and vomiting  . Percocet [Oxycodone-Acetaminophen] Nausea And Vomiting  . Tramadol Nausea And Vomiting     PERTINENT MEDICATIONS:  Outpatient Encounter Medications as of 03/24/2020  Medication Sig  . acetaminophen (TYLENOL) 500 MG tablet Take 1,000 mg by mouth in the morning and at bedtime.  Marland Kitchen albuterol (PROVENTIL HFA;VENTOLIN HFA) 108 (90 Base) MCG/ACT inhaler Inhale 1 puff into the lungs every 6 (six) hours as needed for wheezing or shortness of breath.  Marland Kitchen ammonium lactate (LAC-HYDRIN) 12 % lotion Apply 1 application topically daily as needed for dry skin.   . Ascorbic Acid (VITAMIN C) 1000 MG tablet Take 1,000 mg by mouth daily.  . Cholecalciferol (VITAMIN D-3) 25 MCG (1000 UT) CAPS Take 2,000 Units by mouth daily.  Marland Kitchen  diclofenac (FLECTOR) 1.3 % PTCH Place 1 patch onto the skin 2 (two) times daily. Right wrist/hand twice daily for pain  . diphenoxylate-atropine (LOMOTIL) 2.5-0.025 MG tablet Take 2 tablets by mouth 4 (four) times daily as needed for diarrhea or loose stools. diarrhea  . feeding supplement (ENSURE ENLIVE / ENSURE PLUS) LIQD Take 237 mLs by mouth 2 (two)  times daily between meals.  Marland Kitchen glycopyrrolate (ROBINUL) 2 MG tablet Take 1 tablet (2 mg total) by mouth in the morning.  Marland Kitchen HYDROCODONE-CHLORPHENIRAMINE PO Take 5 mLs by mouth every 12 (twelve) hours as needed. For Cough for 14 days  . HYDROmorphone (DILAUDID) 2 MG tablet Take 1 mg by mouth every 3 (three) hours as needed for severe pain. For 7 days  . levothyroxine (SYNTHROID) 75 MCG tablet Take 1 tablet (75 mcg total) by mouth daily before breakfast.  . metFORMIN (GLUCOPHAGE) 500 MG tablet Take 1 tablet by mouth daily.  . Multiple Vitamin (MULTIVITAMIN WITH MINERALS) TABS tablet Take 1 tablet by mouth daily.  . Naphazoline-Glycerin (CLEAR EYES REDNESS RELIEF OP) Place 2 drops into the right eye 4 (four) times daily as needed. For Eye Irritation  . omeprazole (PRILOSEC) 40 MG capsule Take 1 capsule (40 mg total) by mouth daily.  . ondansetron (ZOFRAN) 4 MG tablet Take 4 mg by mouth every 6 (six) hours as needed for nausea or vomiting.  . OXYGEN Inhale 2 L into the lungs as needed. To maintain O2 saturation of 90%  ( Shortness of Breath)  . polyethylene glycol (MIRALAX / GLYCOLAX) 17 g packet Take 17 g by mouth daily.  Marland Kitchen senna-docusate (SENOKOT-S) 8.6-50 MG tablet Take 1 tablet by mouth 2 (two) times daily.  . traZODone (DESYREL) 50 MG tablet Take 25 mg by mouth at bedtime as needed for sleep.  Marland Kitchen Zinc 50 MG CAPS Take 50 mg by mouth daily.   No facility-administered encounter medications on file as of 03/24/2020.     Thank you for the opportunity to participate in the care of Ms. Iganszewski. The palliative care team will continue to follow. Please call our office at [phone] if we can be of additional assistance.  Ezekiel Slocumb, NP, AGPCNP-C

## 2020-03-30 ENCOUNTER — Other Ambulatory Visit: Payer: Self-pay

## 2020-03-30 ENCOUNTER — Non-Acute Institutional Stay: Payer: Self-pay | Admitting: Student

## 2020-03-30 DIAGNOSIS — Z515 Encounter for palliative care: Secondary | ICD-10-CM

## 2020-03-30 NOTE — Progress Notes (Signed)
[facility] Consult Note Telephone: [phone]  Fax: [phone]  PATIENT NAME: Shelia Black [address] [place] [address] [phone] (home)  DOB: 1921/03/16 MRN: [id number]  PRIMARY CARE PROVIDER:    Lenetta Black  [place] [address] [phone]  REFERRING PROVIDER:   [name], PA-C [place],  [place] [address] [phone]  RESPONSIBLE PARTY:   Extended Emergency Contact Information Primary Emergency Contact: Shelia Black Address: [place],  [address] Shelia Black of [place] Phone: [phone] Mobile Phone: [phone] Relation: Daughter Secondary Emergency Contact: Shelia Black States of [place] Phone: [phone] Relation: Son  I met face to face with patient and family in facility. Patient present but unable to substantively engage in process.   ASSESSMENT AND RECOMMENDATIONS:   Advance Care Planning: Visit at the request of Dr. Mariea Black for palliative consult. Visit consisted of building trust and discussions on Palliative care medicine as specialized medical care for people living with serious illness, aimed at facilitating improved quality of life through symptoms relief, assisting with advance care planning and establishing goals of care. Ongoing education and support on Palliative and Hospice services. Palliative care will continue to provide support to patient, family and the medical team.  Daughter Shelia Black is debating on if patient will stay long term in facility or return home. 24/7 care recommended if patient returns home. She is advised that Palliative will continue to follow patient in either location and hospice can also be provided wherever home is for patient.   Goal of care: To continue therapy; patient to be comfortable.   Directives: MOST form filled out, do not attempt CPR, limited  interventions, antibiotics/IV fluids as indicated, no feeding tube.    Symptom Management:   PainPain- Patient with c/o right wrist hand/pain-continue scheduled acetaminophen 1026m BID, diclofenac patch to right wrist/hand BID. Neck pain-continue acetaminophen as scheduled, recommend heat pack for up to 20 minutes BID prn, neck or cervical pillow for positioning while in bed.   Appetite/mood-patient continues with poor appetite. Continue mirtazapine 7.531mdaily; encourage foods patient enjoys.   Weakness-patient with generalized weakness. She requires assist with all adl's. Continue to participate in occupational and physical therapy as she is able to tolerate.   Follow up Palliative Care Visit: Palliative care will continue to follow for complex decision making and symptom management. Return in 2 weeks or prn.  Family /Caregiver/Community Supports: Palliative Medicine will continue to provide support to patient and family.     I spent 60 minutes providing this consultation, from 11:00am to 12:00pm. Time includes time spent with patient/family, chart review, provider coordination, and documentation. More than 50% of the time in this consultation was spent counseling and coordinating communication.   CHIEF COMPLAINT: Palliative Medicine follow up visit.    History obtained from review of EMR, discussion with primary team, and  interview with family. Records reviewed and summarized below.  HISTORY OF PRESENT ILLNESS:  DoMalayjah Black a [age].o. year old female with multiple medical problems including COPD, interstitial lung disease, pulmonary fibrosis,  arthritis, diabetes, diverticulosis, diverticulitis, GERD, thyroid disease. Patient recently hospitalized 1/24-03/10/2020 due to diverticulitis, sepsis. Palliative Care was asked to follow this patient by consultation request of Dr. ReMariea Clontso help address advance care planning and goals of care. This is a follow up visit.  [facility] living at home, not oxygen dependent. Staff report patient participating in therapy. She will take her medications; poor appetite endorsed. Shelia reports continued decline, sleeping more, less engaging. She is ambulating short distances per family and staff. She has a foley catheter in place; she failed a voiding trial. She has a stage III sacral wound.No recent falls or injury. She recently completed prednisone course due to swelling and pain of right hand. Diclofenac patch being applied to right hand/wrist with relief. She also receives scheduled acetaminophen. Daughter Shelia Black reports neck pain, worse with positioning.  CODE STATUS: DNR  PPS: 40%  HOSPICE ELIGIBILITY/DIAGNOSIS: TBD  ROS   General: NAD ENMT: denies dysphagia Cardiovascular: denies chest pain Pulmonary: denies cough, denies increased SOB Abdomen: poor appetite, stomach cramps GU: denies dysuria MSK: ambulates with assistance, no falls reported Skin: sacral wound Neurological: endorses weakness Psych: depressed mood Heme/lymph/immuno: denies bruises, abnormal bleeding   Physical Exam: Constitutional: NAD; alert, oriented to person, familiars, sleeps throughout most of visit General: frail appearing, thin ENMT: Hard of hearing CV:  no LE edema Pulmonary: no increased work of breathing, no cough, no audible wheezes, room air Abdomen: Bowel sounds normoactive x 4 GU: deferred MSK: sarcopenia Skin: warm and dry, sacral wound not visualized Neuro: Generalized weakness Psych: non anxious mood Hem/lymph/immuno: no widespread bruising   PAST MEDICAL HISTORY:  Past Medical History:  Diagnosis Date  . Arthritis   . Cataract   . Chronic cough   . COPD (chronic obstructive pulmonary disease) ([facility])   . Diabetes mellitus without complication ([facility])   . Diverticulitis   . Diverticulosis   . GERD (gastroesophageal reflux disease)   .  Hepatic steatosis   . Hiatal hernia   . Hypothyroidism   . ILD (interstitial lung disease) ([facility])   . Pulmonary fibrosis ([facility])   . Thyroid disease   . Vertigo     SOCIAL HX:  Social History   Tobacco Use  . Smoking status: Never Smoker  . Smokeless tobacco: Never Used  Substance Use Topics  . Alcohol use: No   FAMILY HX:  Family History  Problem Relation Age of Onset  . Diabetes Mother   . Kidney cancer Mother   . Hypertension Father   . Supraventricular tachycardia Father   . Skin cancer Father   . Diabetes Sister     ALLERGIES:  Allergies  Allergen Reactions  . Oxycodone-Acetaminophen Nausea Only  . Penicillins Hives and Rash    Has patient had a PCN reaction causing immediate rash, facial/tongue/throat swelling, SOB or lightheadedness with hypotension: no Has patient had a PCN reaction causing severe rash involving mucus membranes or skin necrosis: unknown Has patient had a PCN reaction that required hospitalization : unknown Has patient had a PCN reaction occurring within the last 10 years: no If all of the above answers are "NO", then may proceed with Cephalosporin use.   . Codeine     Nausea and vomiting  . Percocet [Oxycodone-Acetaminophen] Nausea And Vomiting  . Tramadol Nausea And Vomiting     PERTINENT MEDICATIONS:  Outpatient Encounter Medications as of 03/30/2020  Medication Sig  . acetaminophen (TYLENOL) 500 MG tablet Take 1,000 mg by mouth in the morning and at bedtime.  Marland Kitchen albuterol (PROVENTIL HFA;VENTOLIN HFA) 108 (90 Base) MCG/ACT inhaler Inhale 1 puff into the lungs every 6 (six) hours as needed for wheezing or shortness of breath.  Marland Kitchen ammonium lactate (LAC-HYDRIN) 12 % lotion Apply 1 application topically daily as needed for dry  skin.   . Ascorbic Acid (VITAMIN C) 1000 MG tablet Take 1,000 mg by mouth daily.  . Cholecalciferol (VITAMIN D-3) 25 MCG (1000 UT) CAPS Take 2,000 Units by mouth daily.  . diclofenac (FLECTOR) 1.3 % PTCH Place 1 patch onto  the skin 2 (two) times daily. Right wrist/hand twice daily for pain  . diphenoxylate-atropine (LOMOTIL) 2.5-0.025 MG tablet Take 2 tablets by mouth 4 (four) times daily as needed for diarrhea or loose stools. diarrhea  . feeding supplement (ENSURE ENLIVE / ENSURE PLUS) LIQD Take 237 mLs by mouth 2 (two) times daily between meals.  Marland Kitchen glycopyrrolate (ROBINUL) 2 MG tablet Take 1 tablet (2 mg total) by mouth in the morning.  Marland Kitchen HYDROCODONE-CHLORPHENIRAMINE PO Take 5 mLs by mouth every 12 (twelve) hours as needed. For Cough for 14 days  . HYDROmorphone (DILAUDID) 2 MG tablet Take 1 mg by mouth every 3 (three) hours as needed for severe pain. For 7 days  . levothyroxine (SYNTHROID) 75 MCG tablet Take 1 tablet (75 mcg total) by mouth daily before breakfast.  . metFORMIN (GLUCOPHAGE) 500 MG tablet Take 1 tablet by mouth daily.  . Multiple Vitamin (MULTIVITAMIN WITH MINERALS) TABS tablet Take 1 tablet by mouth daily.  . Naphazoline-Glycerin (CLEAR EYES REDNESS RELIEF OP) Place 2 drops into the right eye 4 (four) times daily as needed. For Eye Irritation  . omeprazole (PRILOSEC) 40 MG capsule Take 1 capsule (40 mg total) by mouth daily.  . ondansetron (ZOFRAN) 4 MG tablet Take 4 mg by mouth every 6 (six) hours as needed for nausea or vomiting.  . OXYGEN Inhale 2 L into the lungs as needed. To maintain O2 saturation of 90%  ( Shortness of Breath)  . polyethylene glycol (MIRALAX / GLYCOLAX) 17 g packet Take 17 g by mouth daily.  Marland Kitchen senna-docusate (SENOKOT-S) 8.6-50 MG tablet Take 1 tablet by mouth 2 (two) times daily.  . traZODone (DESYREL) 50 MG tablet Take 25 mg by mouth at bedtime as needed for sleep.  Marland Kitchen Zinc 50 MG CAPS Take 50 mg by mouth daily.   No facility-administered encounter medications on file as of 03/30/2020.     Thank you for the opportunity to participate in the care of Ms. Dalesandro. The palliative care team will continue to follow. Please call our office at [phone] if we can be of  additional assistance.  Ezekiel Slocumb, NP, AGPCNP-C

## 2020-04-11 ENCOUNTER — Telehealth: Payer: Self-pay | Admitting: Student

## 2020-04-11 NOTE — Telephone Encounter (Signed)
Palliative NP returned call to patient's daughter Eunice Blase. Patient has moved into another room. [name] reports patient is eating better, more alert, has completed therapy and no further declines. Palliative Medicine will continue to follow patient.

## 2020-04-14 ENCOUNTER — Non-Acute Institutional Stay (SKILLED_NURSING_FACILITY): Payer: Medicare Other | Admitting: Internal Medicine

## 2020-04-14 ENCOUNTER — Encounter: Payer: Self-pay | Admitting: Internal Medicine

## 2020-04-14 DIAGNOSIS — Z978 Presence of other specified devices: Secondary | ICD-10-CM

## 2020-04-14 DIAGNOSIS — Z794 Long term (current) use of insulin: Secondary | ICD-10-CM

## 2020-04-14 DIAGNOSIS — M542 Cervicalgia: Secondary | ICD-10-CM

## 2020-04-14 DIAGNOSIS — K651 Peritoneal abscess: Secondary | ICD-10-CM | POA: Diagnosis not present

## 2020-04-14 DIAGNOSIS — Z66 Do not resuscitate: Secondary | ICD-10-CM

## 2020-04-14 DIAGNOSIS — K5732 Diverticulitis of large intestine without perforation or abscess without bleeding: Secondary | ICD-10-CM | POA: Diagnosis not present

## 2020-04-14 DIAGNOSIS — R54 Age-related physical debility: Secondary | ICD-10-CM

## 2020-04-14 DIAGNOSIS — M11239 Other chondrocalcinosis, unspecified wrist: Secondary | ICD-10-CM | POA: Diagnosis not present

## 2020-04-14 DIAGNOSIS — J690 Pneumonitis due to inhalation of food and vomit: Secondary | ICD-10-CM

## 2020-04-14 DIAGNOSIS — G8929 Other chronic pain: Secondary | ICD-10-CM

## 2020-04-14 DIAGNOSIS — E118 Type 2 diabetes mellitus with unspecified complications: Secondary | ICD-10-CM

## 2020-04-14 DIAGNOSIS — Z7189 Other specified counseling: Secondary | ICD-10-CM

## 2020-04-14 MED ORDER — ADULT MULTIVITAMIN W/MINERALS CH: 1.0000 | ORAL_TABLET | Freq: Every day | ORAL | Status: AC

## 2020-04-14 MED ORDER — BOOST BREEZE PO LIQD: 1.0000 | Freq: Three times a day (TID) | ORAL | 11 refills | Status: DC

## 2020-04-14 MED ORDER — ALBUTEROL SULFATE HFA 108 (90 BASE) MCG/ACT IN AERS: 2.0000 | INHALATION_SPRAY | Freq: Four times a day (QID) | RESPIRATORY_TRACT | 0 refills | Status: AC | PRN

## 2020-04-14 NOTE — Progress Notes (Signed)
Location:  [place] of Service:  SNF [phone]) Provider:  Mico Spark L. Mariea Clonts, D.O., C.M.D.  [name], PA-C  Patient Care Team: [name], PA-C as PCP - General (Family Medicine) System, Provider Not In  Extended Emergency Contact Information Primary Emergency Contact: Shelia Black Address: [place], [place] [address] [name] of [place] Phone: [phone] Mobile Phone: [phone] Relation: Daughter Secondary Emergency Contact: [name] of [place] Mobile Phone: [phone] Relation: Son  Code Status:  DNR, MOST in place Goals of care: Advanced Directive information Advanced Directives 03/15/2020  Does Patient Have a Medical Advance Directive? Yes  Type of Advance Directive Living will  Does patient want to make changes to medical advance directive? No - Patient declined  Copy of [place] in Chart? -  Would patient like information on creating a medical advance directive? -   Chief Complaint  Patient presents with  . Acute Visit    Per daughter's request to assess progress    HPI:  Pt is a 87 y.o. female seen today for an acute visit at the request of her daughter, Shelia Black, to reassess her mother's progress in rehab.  Shelia Black was admitted to [facility] 2/4 after a 2 wk hospitalization at [facility].  She has a past medical history significant for type 2 diabetes, gastroesophageal reflux disease, COPD, pulmonary fibrosis, hypothyroidism, chronic kidney disease stage III, chronic neck pain, and depression.  He had been admitted to the hospital due to significant hypotension at her physician's office.  UA could not be obtained in the office and blood pressure also could not be obtained.  She had been having increased fatigue, abdominal pain and difficulty urinating.  At admission her WBC count was 11.  CT of the abdomen revealed sigmoid diverticulitis with a 3.1 cm abscess.   With involvement of interventional radiology and surgery was decided that she would be treated conservatively with antibiotics with Omnicef and Flagyl and IV fluid rehydration rather than pursuing surgical intervention and the location of her abscess was not amenable to drainage.  BP meds were stopped.  She was also treated for aspiration pneumonia and COPD exacerbation.  She had considerable right knee pain managed with high-strength opioids in the hospital.  When she came here she had difficulty also with wrist pain was felt to have pseudogout that was treated with another course of prednisone.  He also had difficulty with acute kidney injury and hypokalemia related to poor hydration.  She had been on insulin and a sliding scale at the hospital back on her home Metformin now.  CBGs have been running from 120s to 1 maximum of 174.  She had a Foley catheter that remains in place as she failed a voiding trial here.    She has been receiving physical therapy and the therapy team determined that she had reached her maximum potential which involves standing and pivoting for transfers taking just a few steps with her walker.  She remains dependent in all activities of daily living except to feed herself after her tray set up.  She did have a sacral wound that has been healed with careful wound care here.  She does remain at risk for this recurring as she spends a good bit of time in bed and finds her 1 side to be the most comfortable due to her chronic neck pain.  She has been evaluated by  palliative care and a MOST form has been completed to address her goals of care.  One of her goals is to return home.  Attempts have been made to help increase her appetite including initially mirtazapine 7.5 mg has been increased to 15 mg since February 23 with little change in Shelia Black's appetite.  She remains on breeze supplements though she does not finish them entirely.  She is on a mechanical soft diet with thin liquids  and eats less than 5% of her meals.  She will also take candy.  Another challenge that she is very hard of hearing so her orientation was difficult to assess today.  She heard about 50% of my questions clearly despite speaking directly into her ear at times.    Her daughter had requested my evaluation upon receiving skilled nursing facility notice of noncoverage.  Further coverage was being denied as the "patient does not require SNF care on a daily basis"; however, her daughter points out that she does require help with all of her activities of daily living and she would like for her to continue to receive some therapy.  Shelia Black has appealed the insurance denial.  She notes that the resident does have her Foley which requires nursing attendance, twice daily CBGs and full ADL assistance except to feed herself after tray set up.  [name] notes that initially when she started receiving therapy she remains lethargic and was having considerable pain related to her wrist pseudogout that was treated with the prednisone and she had ongoing use of Dilaudid for her neck pain.  Fortunately this has been weaned and she is much more alert than previously.  Shelia Black is currently unable to handle her mother at home mentally and physically though that is a long-term goal.  We also discussed her arthritis of her hands and knees and neck.  She is currently on the Flector patch for her neck pain and Tylenol as needed for pain.  Her daughter was requesting an anti-inflammatory we reviewed the risks and benefits of this and she opted to hold off as resident did not complain of any pain today when she was seen by me.  There is also some question about obesity diagnosis listed in facility diagnoses which is not listed in epic medical record.  Past Medical History:  Diagnosis Date  . Arthritis   . Cataract   . Chronic cough   . COPD (chronic obstructive pulmonary disease) ([place])   . Diabetes mellitus without complication ([place])    . Diverticulitis   . Diverticulosis   . GERD (gastroesophageal reflux disease)   . Hepatic steatosis   . Hiatal hernia   . Hypothyroidism   . ILD (interstitial lung disease) ([place])   . Pulmonary fibrosis ([place])   . Thyroid disease   . Vertigo    Past Surgical History:  Procedure Laterality Date  . APPENDECTOMY    . INTRAOCULAR LENS INSERTION  1997  . REPLACEMENT TOTAL KNEE BILATERAL    . SHOULDER SURGERY Right    tendonitis    Allergies  Allergen Reactions  . Oxycodone-Acetaminophen Nausea Only  . Penicillins Hives and Rash    Has patient had a PCN reaction causing immediate rash, facial/tongue/throat swelling, SOB or lightheadedness with hypotension: no Has patient had a PCN reaction causing severe rash involving mucus membranes or skin necrosis: unknown Has patient had a PCN reaction that required hospitalization : unknown Has patient had a PCN reaction occurring within the last 10 years: no If all of  the above answers are "NO", then may proceed with Cephalosporin use.   . Codeine     Nausea and vomiting  . Percocet [Oxycodone-Acetaminophen] Nausea And Vomiting  . Tramadol Nausea And Vomiting    Outpatient Encounter Medications as of 04/14/2020  Medication Sig  . acetaminophen (TYLENOL) 500 MG tablet Take 1,000 mg by mouth in the morning and at bedtime.  Marland Kitchen albuterol (PROVENTIL HFA;VENTOLIN HFA) 108 (90 Base) MCG/ACT inhaler Inhale 1 puff into the lungs every 6 (six) hours as needed for wheezing or shortness of breath.  Marland Kitchen ammonium lactate (LAC-HYDRIN) 12 % lotion Apply 1 application topically daily as needed for dry skin.   . Ascorbic Acid (VITAMIN C) 1000 MG tablet Take 1,000 mg by mouth daily.  . Cholecalciferol (VITAMIN D-3) 25 MCG (1000 UT) CAPS Take 2,000 Units by mouth daily.  . diclofenac (FLECTOR) 1.3 % PTCH Place 1 patch onto the skin 2 (two) times daily. Right wrist/hand twice daily for pain  . diphenoxylate-atropine (LOMOTIL) 2.5-0.025 MG tablet Take 2 tablets  by mouth 4 (four) times daily as needed for diarrhea or loose stools. diarrhea  . feeding supplement (ENSURE ENLIVE / ENSURE PLUS) LIQD Take 237 mLs by mouth 2 (two) times daily between meals.  Marland Kitchen glycopyrrolate (ROBINUL) 2 MG tablet Take 1 tablet (2 mg total) by mouth in the morning.  Marland Kitchen HYDROCODONE-CHLORPHENIRAMINE PO Take 5 mLs by mouth every 12 (twelve) hours as needed. For Cough for 14 days  . HYDROmorphone (DILAUDID) 2 MG tablet Take 1 mg by mouth every 3 (three) hours as needed for severe pain. For 7 days  . levothyroxine (SYNTHROID) 75 MCG tablet Take 1 tablet (75 mcg total) by mouth daily before breakfast.  . metFORMIN (GLUCOPHAGE) 500 MG tablet Take 1 tablet by mouth daily.  . Multiple Vitamin (MULTIVITAMIN WITH MINERALS) TABS tablet Take 1 tablet by mouth daily.  . Naphazoline-Glycerin (CLEAR EYES REDNESS RELIEF OP) Place 2 drops into the right eye 4 (four) times daily as needed. For Eye Irritation  . omeprazole (PRILOSEC) 40 MG capsule Take 1 capsule (40 mg total) by mouth daily.  . ondansetron (ZOFRAN) 4 MG tablet Take 4 mg by mouth every 6 (six) hours as needed for nausea or vomiting.  . OXYGEN Inhale 2 L into the lungs as needed. To maintain O2 saturation of 90%  ( Shortness of Breath)  . polyethylene glycol (MIRALAX / GLYCOLAX) 17 g packet Take 17 g by mouth daily.  Marland Kitchen senna-docusate (SENOKOT-S) 8.6-50 MG tablet Take 1 tablet by mouth 2 (two) times daily.  . traZODone (DESYREL) 50 MG tablet Take 25 mg by mouth at bedtime as needed for sleep.  Marland Kitchen Zinc 50 MG CAPS Take 50 mg by mouth daily.   No facility-administered encounter medications on file as of 04/14/2020.    Review of Systems  Constitutional: Positive for malaise/fatigue. Negative for chills and fever.  HENT: Positive for hearing loss. Negative for congestion and sore throat.   Eyes: Negative for blurred vision.  Respiratory: Negative for cough and shortness of breath.   Cardiovascular: Negative for chest pain, palpitations  and leg swelling.  Gastrointestinal: Negative for abdominal pain, blood in stool, constipation, diarrhea and melena.  Genitourinary: Negative for dysuria.  Musculoskeletal: Positive for joint pain (knees) and neck pain. Negative for falls.  Skin: Negative for itching and rash.       Sacral wound healed but at risk for recurrence  Neurological: Positive for weakness. Negative for dizziness and loss of consciousness.  Endo/Heme/Allergies:  Bruises/bleeds easily.  Psychiatric/Behavioral: Positive for depression. The patient has insomnia. The patient is not nervous/anxious.        Reports not sleeping well at night, but is resting much of the day in bed    Immunization History  Administered Date(s) Administered  . DT (Pediatric) 02/04/1978, 02/05/1988  . Influenza Split 11/03/2018  . Influenza Whole 12/09/2006  . Influenza, High Dose Seasonal PF 01/24/2017, 01/08/2018  . Influenza, Seasonal, Injecte, Preservative Fre 11/07/2009, 11/30/2010, 11/25/2011  . Influenza,inj,Quad PF,6+ Mos 12/22/2012, 01/20/2014, 11/19/2019  . Influenza-Unspecified 11/13/2015  . PFIZER(Purple Top)SARS-COV-2 Vaccination 03/31/2019, 04/28/2019, 11/15/2019  . Pneumococcal Conjugate-13 06/24/2013  . Pneumococcal Polysaccharide-23 08/08/2017, 10/27/2018  . Pneumococcal-Unspecified 11/25/2000   Pertinent  Health Maintenance Due  Topic Date Due  . FOOT EXAM  Never done  . OPHTHALMOLOGY EXAM  Never done  . URINE MICROALBUMIN  Never done  . DEXA SCAN  Never done  . HEMOGLOBIN A1C  08/29/2020  . INFLUENZA VACCINE  Completed  . PNA vac Low Risk Adult  Completed   No flowsheet data found. Functional Status Survey:    There were no vitals filed for this visit. There is no height or weight on file to calculate BMI. Physical Exam Vitals reviewed.  Constitutional:      General: She is not in acute distress.    Appearance: She is not toxic-appearing.  HENT:     Head: Normocephalic and atraumatic.     Ears:      Comments: Very HOH Cardiovascular:     Rate and Rhythm: Normal rate and regular rhythm.     Pulses: Normal pulses.     Heart sounds: Normal heart sounds.  Pulmonary:     Effort: Pulmonary effort is normal.     Breath sounds: Normal breath sounds. No wheezing, rhonchi or rales.  Abdominal:     General: Bowel sounds are normal.     Palpations: Abdomen is soft.     Tenderness: There is no abdominal tenderness.  Musculoskeletal:        General: No swelling or tenderness. Normal range of motion.     Right lower leg: No edema.     Left lower leg: No edema.     Comments: Sleeps on side to help neck pain  Skin:    General: Skin is warm and dry.  Neurological:     Mental Status: She is alert.     Gait: Gait abnormal.     Comments: Using walker for a few steps and stand-pivot turn for transfers  Psychiatric:        Mood and Affect: Mood normal.     Comments: Was pleasant when able to hear responses and answered correctly on orientation to place in general, year, but could not hear the rest of the orientation questions     Labs reviewed: Recent Labs    03/08/20 0542 03/09/20 0454 03/10/20 0449  NA 138 137 139  K 4.3 3.9 4.9  CL 104 104 101  CO2 24 24 27   GLUCOSE 259* 220* 149*  BUN 19 20 16   CREATININE 0.64 0.72 0.71  CALCIUM 8.3* 8.3* 8.7*  MG  --  1.7 2.1   Recent Labs    03/06/20 0610 03/07/20 0516 03/08/20 0542  AST 13* 16 26  ALT 11 11 11   ALKPHOS 42 45 59  BILITOT 0.4 0.4 0.5  PROT 5.3* 5.5* 5.4*  ALBUMIN 2.3* 2.3* 2.3*   Recent Labs    03/07/20 0516 03/08/20 0542 03/09/20 0454  WBC 12.6* 10.9* 11.1*  NEUTROABS 9.4* 8.3* 7.2  HGB 10.1* 9.9* 9.9*  HCT 31.0* 30.3* 30.5*  MCV 101.6* 101.7* 100.7*  PLT 499* 500* 454*   Lab Results  Component Value Date   TSH 5.492 (H) 03/01/2020   Lab Results  Component Value Date   HGBA1C 6.3 (H) 03/01/2020   No results found for: CHOL, HDL, LDLCALC, LDLDIRECT, TRIG, CHOLHDL  Significant Diagnostic Results in  last 30 days:  No results found.  Assessment/Plan 1. Sigmoid diverticulitis -seems this has resolve with abx  2. Intra-abdominal abscess ([place]) -no further issues with abdominal pain, fever, bowels -hopefully resolved with abx but would not recommend pursuing repeat imaging at this time considering how frail she is and challenges with transportation  3. Pseudogout of wrist, unspecified laterality -completed course of prednisone and improved  4. Controlled type 2 diabetes mellitus with complication, with long-term current use of insulin (HCC) -control has been excellent considering her age and debility here with metformin 540m daily alone--recommend trying to stop it and see if she improves with appetite  5. Aspiration pneumonia due to gastric secretions, unspecified laterality, unspecified part of lung ([place] -resolved with abx -on mechanical soft diet with thin liquids and receiving ST  6. Indwelling Foley catheter present -remains as she retained when voiding trial conducted here -mobility limited  7. Neck pain, chronic -cont flector patch, prn tylenol--might schedule it  -could add mobic 7.592mdaily with her boost if c/o pain when seen (did not today)  8. Frailty syndrome in geriatric patient -notably, her age is quite advanced and she is adl-dependent with limited mobility, recent wound, poor intake and her daughter's goal is to get her strong enough that she could safely bring her home where she will be happier  9. ACP (advance care planning) -MOST on file for DNR, limited, abx if indicated, fluids trial, no tube feeding -palliative care following  - spent 30 mins discussing care transitions, ACP, and comfort measures for Shelia Black today with [name]  10. DNR (do not resuscitate) - Do not attempt resuscitation (DNR)--order had not been entered into AHT after MOST completed so written out today   Family/ staff Communication: 30 minutes spent discussing care transitions/progress  with her daughter on the phone today, also goals of care with primary being to get back home and not to die at snf  Labs/tests ordered:  No new  Harlynn Kimbell L. Marguita Venning, D.O. [facility] [address] [address] Phone (Mon-Fri 8am-5pm):  [phone] Call:  [phone] follow prompts after 5pm & weekends Office Phone:  [phone] Fax:  [phone]

## 2020-04-15 LAB — TSH: TSH: 10.5 — AB (ref <=5.90)

## 2020-04-17 ENCOUNTER — Non-Acute Institutional Stay (SKILLED_NURSING_FACILITY): Payer: Medicare Other | Admitting: Orthopedic Surgery

## 2020-04-17 ENCOUNTER — Encounter: Payer: Self-pay | Admitting: Orthopedic Surgery

## 2020-04-17 DIAGNOSIS — E039 Hypothyroidism, unspecified: Secondary | ICD-10-CM

## 2020-04-17 MED ORDER — LEVOTHYROXINE SODIUM 88 MCG PO TABS: 88.0000 ug | ORAL_TABLET | Freq: Every day | ORAL | 2 refills | Status: AC

## 2020-04-17 NOTE — Progress Notes (Signed)
Location:    [facility] Room Number: 323/P Place of Service:  SNF [phone]) Provider:  Windell Moulding NP  [name], PA-C  Patient Care Team: [name], PA-C as PCP - General (Family Medicine) System, Provider Not In  Extended Emergency Contact Information Primary Emergency Contact: [name],[name] Address: [place], [place] [address] [name] of [place] Phone: [phone] Mobile Phone: [phone] Relation: Daughter Secondary Emergency Contact: [name] of [place] Phone: [phone] Relation: Son  Code Status:  Full Code Goals of care: Advanced Directive information Advanced Directives 04/17/2020  Does Patient Have a Medical Advance Directive? Yes  Type of Advance Directive Living will  Does patient want to make changes to medical advance directive? No - Patient declined  Copy of [place] in Chart? -  Would patient like information on creating a medical advance directive? -  Pre-existing out of facility DNR order (yellow form or pink MOST form) Pink MOST form placed in chart (order not valid for inpatient use)     Chief Complaint  Patient presents with  . Acute Visit    Elevated TSH Levels    HPI:  Pt is a 87 y.o. female seen today for an acute visit for elevated TS level.   She currently resides at [facility], seen today at bedside.   Facility nurse reports TSH elevated at 10.50. Last TSH during hospitalization was 5.49 on 01/26. Unclear if levothyroxine is given on empty stomach, but she is receiving medication daily. She denies fatigue. Moving bowels few times weekly.    Past Medical History:  Diagnosis Date  . Arthritis   . Cataract   . Chronic cough   . COPD (chronic obstructive pulmonary disease) ([place])   . Diabetes mellitus without complication ([place])   . Diverticulitis   . Diverticulosis   . GERD (gastroesophageal  reflux disease)   . Hepatic steatosis   . Hiatal hernia   . Hypothyroidism   . ILD (interstitial lung disease) ([place])   . Pulmonary fibrosis ([place])   . Thyroid disease   . Vertigo    Past Surgical History:  Procedure Laterality Date  . APPENDECTOMY    . INTRAOCULAR LENS INSERTION  1997  . REPLACEMENT TOTAL KNEE BILATERAL    . SHOULDER SURGERY Right    tendonitis    Allergies  Allergen Reactions  . Oxycodone-Acetaminophen Nausea Only  . Penicillins Hives and Rash    Has patient had a PCN reaction causing immediate rash, facial/tongue/throat swelling, SOB or lightheadedness with hypotension: no Has patient had a PCN reaction causing severe rash involving mucus membranes or skin necrosis: unknown Has patient had a PCN reaction that required hospitalization : unknown Has patient had a PCN reaction occurring within the last 10 years: no If all of the above answers are "NO", then may proceed with Cephalosporin use.   . Codeine     Nausea and vomiting  . Percocet [Oxycodone-Acetaminophen] Nausea And Vomiting  . Tramadol Nausea And Vomiting    Allergies as of 04/17/2020      Reactions   Oxycodone-acetaminophen Nausea Only   Penicillins Hives, Rash   Has patient had a PCN reaction causing immediate rash, facial/tongue/throat swelling, SOB or lightheadedness with hypotension: no Has patient had a PCN reaction causing severe rash involving mucus membranes or skin necrosis: unknown Has patient had a PCN reaction that required hospitalization : unknown  Has patient had a PCN reaction occurring within the last 10 years: no If all of the above answers are "NO", then may proceed with Cephalosporin use.   Codeine    Nausea and vomiting   Percocet [oxycodone-acetaminophen] Nausea And Vomiting   Tramadol Nausea And Vomiting      Medication List       Accurate as of April 17, 2020 10:40 AM. If you have any questions, ask your nurse or doctor.        acetaminophen 500 MG  tablet Commonly known as: TYLENOL Take 1,000 mg by mouth in the morning and at bedtime.   albuterol 108 (90 Base) MCG/ACT inhaler Commonly known as: VENTOLIN HFA Inhale 2 puffs into the lungs every 6 (six) hours as needed for wheezing or shortness of breath.   ammonium lactate 12 % lotion Commonly known as: LAC-HYDRIN Apply 1 application topically daily as needed for dry skin.   CLEAR EYES REDNESS RELIEF OP Place 2 drops into the right eye 4 (four) times daily as needed. For Eye Irritation   diclofenac 1.3 % Ptch Commonly known as: FLECTOR Place 1 patch onto the skin 2 (two) times daily. Right wrist and neck twice daily for pain   diphenoxylate-atropine 2.5-0.025 MG tablet Commonly known as: LOMOTIL Take 2 tablets by mouth 4 (four) times daily as needed for diarrhea or loose stools. diarrhea   feeding supplement Liqd Commonly known as: BOOST / RESOURCE BREEZE Take 1 Container by mouth 3 (three) times daily. To promote wound healing   glycopyrrolate 2 MG tablet Commonly known as: ROBINUL Take 1 tablet (2 mg total) by mouth in the morning.   levothyroxine 75 MCG tablet Commonly known as: SYNTHROID Take 1 tablet (75 mcg total) by mouth daily before breakfast.   metFORMIN 500 MG tablet Commonly known as: GLUCOPHAGE Take 1 tablet by mouth daily.   mirtazapine 15 MG tablet Commonly known as: REMERON Take 15 mg by mouth at bedtime. For appetite   multivitamin with minerals Tabs tablet Take 1 tablet by mouth daily.   omeprazole 40 MG capsule Commonly known as: PRILOSEC Take 1 capsule (40 mg total) by mouth daily.   ondansetron 4 MG tablet Commonly known as: ZOFRAN Take 4 mg by mouth every 6 (six) hours as needed for nausea or vomiting.   OXYGEN Inhale 2 L into the lungs as needed. To maintain O2 saturation of 90%  ( Shortness of Breath)   polyethylene glycol 17 g packet Commonly known as: MIRALAX / GLYCOLAX Take 17 g by mouth daily.   senna-docusate 8.6-50 MG  tablet Commonly known as: Senokot-S Take 1 tablet by mouth 2 (two) times daily.   vitamin C 1000 MG tablet Take 1,000 mg by mouth daily.   Vitamin D-3 25 MCG (1000 UT) Caps Take 2,000 Units by mouth daily.   Zinc 50 MG Caps Take 50 mg by mouth daily.       Review of Systems  Constitutional: Negative for activity change, appetite change and fatigue.  HENT: Positive for hearing loss.   Respiratory: Negative for cough, shortness of breath and wheezing.   Cardiovascular: Negative for chest pain and leg swelling.  Gastrointestinal: Negative for abdominal distention, abdominal pain and constipation.  Psychiatric/Behavioral: Negative for dysphoric mood. The patient is not nervous/anxious.     Immunization History  Administered Date(s) Administered  . DT (Pediatric) 02/04/1978, 02/05/1988  . Influenza Split 11/03/2018  . Influenza Whole 12/09/2006  . Influenza, High Dose Seasonal PF 01/24/2017, 01/08/2018  .  Influenza, Seasonal, Injecte, Preservative Fre 11/07/2009, 11/30/2010, 11/25/2011  . Influenza,inj,Quad PF,6+ Mos 12/22/2012, 01/20/2014, 11/19/2019  . Influenza-Unspecified 11/13/2015  . PFIZER(Purple Top)SARS-COV-2 Vaccination 03/31/2019, 04/28/2019, 11/15/2019  . Pneumococcal Conjugate-13 06/24/2013  . Pneumococcal Polysaccharide-23 08/08/2017, 10/27/2018  . Pneumococcal-Unspecified 11/25/2000   Pertinent  Health Maintenance Due  Topic Date Due  . FOOT EXAM  Never done  . OPHTHALMOLOGY EXAM  Never done  . URINE MICROALBUMIN  Never done  . DEXA SCAN  Never done  . HEMOGLOBIN A1C  08/29/2020  . INFLUENZA VACCINE  Completed  . PNA vac Low Risk Adult  Completed   No flowsheet data found. Functional Status Survey:    Vitals:   04/17/20 1037  BP: 129/63  Pulse: 79  Resp: 18  Temp: (!) 97.5 F (36.4 C)  SpO2: 94%  Weight: 124 lb (56.2 kg)  Height: 5' 4"  (1.626 m)   Body mass index is 21.28 kg/m. Physical Exam Vitals reviewed.  Constitutional:      General:  She is not in acute distress. HENT:     Head: Normocephalic.  Neck:     Thyroid: No thyroid mass or thyromegaly.  Cardiovascular:     Rate and Rhythm: Normal rate and regular rhythm.     Pulses: Normal pulses.     Heart sounds: Normal heart sounds. No murmur heard.   Pulmonary:     Effort: Pulmonary effort is normal. No respiratory distress.     Breath sounds: Normal breath sounds. No wheezing.  Abdominal:     General: Abdomen is flat. Bowel sounds are normal.     Palpations: Abdomen is soft.  Musculoskeletal:     Cervical back: Decreased range of motion.  Neurological:     General: No focal deficit present.     Mental Status: She is alert. Mental status is at baseline.  Psychiatric:        Mood and Affect: Mood normal.        Behavior: Behavior normal.     Labs reviewed: Recent Labs    03/08/20 0542 03/09/20 0454 03/10/20 0449  NA 138 137 139  K 4.3 3.9 4.9  CL 104 104 101  CO2 24 24 27   GLUCOSE 259* 220* 149*  BUN 19 20 16   CREATININE 0.64 0.72 0.71  CALCIUM 8.3* 8.3* 8.7*  MG  --  1.7 2.1   Recent Labs    03/06/20 0610 03/07/20 0516 03/08/20 0542  AST 13* 16 26  ALT 11 11 11   ALKPHOS 42 45 59  BILITOT 0.4 0.4 0.5  PROT 5.3* 5.5* 5.4*  ALBUMIN 2.3* 2.3* 2.3*   Recent Labs    03/07/20 0516 03/08/20 0542 03/09/20 0454  WBC 12.6* 10.9* 11.1*  NEUTROABS 9.4* 8.3* 7.2  HGB 10.1* 9.9* 9.9*  HCT 31.0* 30.3* 30.5*  MCV 101.6* 101.7* 100.7*  PLT 499* 500* 454*   Lab Results  Component Value Date   TSH 10.50 (A) 04/15/2020   Lab Results  Component Value Date   HGBA1C 6.3 (H) 03/01/2020   No results found for: CHOL, HDL, LDLCALC, LDLDIRECT, TRIG, CHOLHDL  Significant Diagnostic Results in last 30 days:  No results found.  Assessment/Plan 1. Hypothyroidism, unspecified type - TSH 10.50, asymptomatic - advise to give levothyroxine on empty stomach - will increase levothyroxine to 88 mcg po daily - recheck TSH in 4 weeks    Family/ staff  Communication:   Labs/tests ordered:

## 2020-04-20 ENCOUNTER — Telehealth: Payer: Self-pay

## 2020-04-20 NOTE — Telephone Encounter (Signed)
04/20/20 @4PM : Palliative care SW outreached patients daughter, [name], per NP request.   Daughter shared that she has decided to leave patient at [facility] and will have to do a spend down in order to qualify for Medicaid. Patient is currently in a semi-private room (302). Daughter and SW discussed 60 day Medicare wellness period and where she can go to read on this more. Daughter shared that patient had a fall and wants to make sure that palliative care NP is aware. Daughter had no other questions for SW at this time.

## 2020-04-25 ENCOUNTER — Encounter: Payer: Self-pay | Admitting: Orthopedic Surgery

## 2020-04-25 ENCOUNTER — Non-Acute Institutional Stay (SKILLED_NURSING_FACILITY): Payer: Medicare Other | Admitting: Orthopedic Surgery

## 2020-04-25 DIAGNOSIS — K5732 Diverticulitis of large intestine without perforation or abscess without bleeding: Secondary | ICD-10-CM | POA: Diagnosis not present

## 2020-04-25 DIAGNOSIS — R1031 Right lower quadrant pain: Secondary | ICD-10-CM

## 2020-04-25 NOTE — Progress Notes (Signed)
Location:    [facility] Room Number: 016/W Place of Service:  SNF [phone]) Provider:  Windell Moulding NP  [name], PA-C  Patient Care Team: [name], PA-C as PCP - General (Family Medicine) System, Provider Not In  Extended Emergency Contact Information Primary Emergency Contact: [name],[name] Address: [place], [place] [address] [name] of [place] Phone: [phone] Mobile Phone: [phone] Relation: Daughter Secondary Emergency Contact: [name] of [place] Mobile Phone: [phone] Relation: Son  Code Status:  Full Code Goals of care: Advanced Directive information Advanced Directives 04/25/2020  Does Patient Have a Medical Advance Directive? Yes  Type of Advance Directive Living will  Does patient want to make changes to medical advance directive? No - Patient declined  Copy of [place] in Chart? -  Would patient like information on creating a medical advance directive? -  Pre-existing out of facility DNR order (yellow form or pink MOST form) Pink MOST form placed in chart (order not valid for inpatient use)     Chief Complaint  Patient presents with  . Acute Visit    Abdominal Pain     HPI:  Pt is a 87 y.o. female seen today for an acute visit for abdominal pain.   Daughter present for encounter.   She is a resident of [facility], seen today at bedside. PMH includes type 2 diabetes, GERD, COPD, pulmonary fibrosis, hypothyroidism, CKD stage III, neck pain and depression.   Facility nurse reports increased abdominal pain. She was hospitalized earlier this year for sigmoid diverticulitis with 3.1 cm abscess. General surgery recommended conservative approach with IV antibiotics and bowel rest. IR consulted and did not recommend percutaneous drain. Her WBC began to trend down and she was eventually transitioned to oral clindamycin,  then Omnicef and flagyl.   Today, she is alert to voice. When asked about her stomach pain she states " yes." She cannot rate pain. Increased pain noted when palpating over lower quadrants. Her roommate states she was up most of the night due to stomach pain.   Daughter requesting repeat CT scan.   Facility nurse does not report any concerns, vitals stable.    Past Medical History:  Diagnosis Date  . Arthritis   . Cataract   . Chronic cough   . COPD (chronic obstructive pulmonary disease) ([facility])   . Diabetes mellitus without complication ([facility])   . Diverticulitis   . Diverticulosis   . GERD (gastroesophageal reflux disease)   . Hepatic steatosis   . Hiatal hernia   . Hypothyroidism   . ILD (interstitial lung disease) ([facility])   . Pulmonary fibrosis ([facility])   . Thyroid disease   . Vertigo    Past Surgical History:  Procedure Laterality Date  . APPENDECTOMY    . INTRAOCULAR LENS INSERTION  1997  . REPLACEMENT TOTAL KNEE BILATERAL    . SHOULDER SURGERY Right    tendonitis    Allergies  Allergen Reactions  . Oxycodone-Acetaminophen Nausea Only  . Penicillins Hives and Rash    Has patient had a PCN reaction causing immediate rash, facial/tongue/throat swelling, SOB or lightheadedness with hypotension: no Has patient had a PCN reaction causing severe rash involving mucus membranes or skin necrosis: unknown Has patient had a PCN reaction that required hospitalization : unknown Has patient had a PCN reaction occurring within the last 10 years: no If all of  the above answers are "NO", then may proceed with Cephalosporin use.   . Codeine     Nausea and vomiting  . Percocet [Oxycodone-Acetaminophen] Nausea And Vomiting  . Tramadol Nausea And Vomiting    Allergies as of 04/25/2020      Reactions   Oxycodone-acetaminophen Nausea Only   Penicillins Hives, Rash   Has patient had a PCN reaction causing immediate rash, facial/tongue/throat swelling, SOB or lightheadedness with  hypotension: no Has patient had a PCN reaction causing severe rash involving mucus membranes or skin necrosis: unknown Has patient had a PCN reaction that required hospitalization : unknown Has patient had a PCN reaction occurring within the last 10 years: no If all of the above answers are "NO", then may proceed with Cephalosporin use.   Codeine    Nausea and vomiting   Percocet [oxycodone-acetaminophen] Nausea And Vomiting   Tramadol Nausea And Vomiting      Medication List       Accurate as of April 25, 2020  4:27 PM. If you have any questions, ask your nurse or doctor.        acetaminophen 500 MG tablet Commonly known as: TYLENOL Take 1,000 mg by mouth in the morning and at bedtime.   albuterol 108 (90 Base) MCG/ACT inhaler Commonly known as: VENTOLIN HFA Inhale 2 puffs into the lungs every 6 (six) hours as needed for wheezing or shortness of breath.   ammonium lactate 12 % lotion Commonly known as: LAC-HYDRIN Apply 1 application topically daily as needed for dry skin.   CLEAR EYES REDNESS RELIEF OP Place 2 drops into the right eye 4 (four) times daily as needed. For Eye Irritation   diclofenac 1.3 % Ptch Commonly known as: FLECTOR Place 1 patch onto the skin 2 (two) times daily. Right wrist and neck twice daily for pain   diphenoxylate-atropine 2.5-0.025 MG tablet Commonly known as: LOMOTIL Take 2 tablets by mouth 4 (four) times daily as needed for diarrhea or loose stools. diarrhea   feeding supplement Liqd Commonly known as: BOOST / RESOURCE BREEZE Take 1 Container by mouth 3 (three) times daily. To promote wound healing   glycopyrrolate 2 MG tablet Commonly known as: ROBINUL Take 1 tablet (2 mg total) by mouth in the morning.   levothyroxine 88 MCG tablet Commonly known as: SYNTHROID Take 1 tablet (88 mcg total) by mouth daily before breakfast.   metFORMIN 500 MG tablet Commonly known as: GLUCOPHAGE Take 1 tablet by mouth daily.   mirtazapine 15 MG  tablet Commonly known as: REMERON Take 15 mg by mouth at bedtime. For appetite   multivitamin with minerals Tabs tablet Take 1 tablet by mouth daily.   omeprazole 40 MG capsule Commonly known as: PRILOSEC Take 1 capsule (40 mg total) by mouth daily.   ondansetron 4 MG tablet Commonly known as: ZOFRAN Take 4 mg by mouth every 6 (six) hours as needed for nausea or vomiting.   OXYGEN Inhale 2 L into the lungs as needed. To maintain O2 saturation of 90%  ( Shortness of Breath)   polyethylene glycol 17 g packet Commonly known as: MIRALAX / GLYCOLAX Take 17 g by mouth daily.   Preparation H 0.25-14-74.9 % rectal ointment Generic drug: phenylephrine-shark liver oil-mineral oil-petrolatum Place 1 application rectally 4 (four) times daily as needed for hemorrhoids.   senna-docusate 8.6-50 MG tablet Commonly known as: Senokot-S Take 1 tablet by mouth 2 (two) times daily.   vitamin C 1000 MG tablet Take 1,000 mg by mouth  daily.   Vitamin D-3 25 MCG (1000 UT) Caps Take 2,000 Units by mouth daily.   Zinc 50 MG Caps Take 50 mg by mouth daily.       Review of Systems  Constitutional: Positive for appetite change and fatigue. Negative for fever.       Poor appetite  HENT: Positive for hearing loss and trouble swallowing.   Respiratory: Negative for cough, shortness of breath and wheezing.   Cardiovascular: Negative for chest pain and leg swelling.  Gastrointestinal: Positive for abdominal pain and constipation. Negative for abdominal distention, diarrhea and nausea.  Psychiatric/Behavioral: Positive for sleep disturbance. Negative for confusion and dysphoric mood. The patient is not nervous/anxious.     Immunization History  Administered Date(s) Administered  . DT (Pediatric) 02/04/1978, 02/05/1988  . Influenza Split 11/03/2018  . Influenza Whole 12/09/2006  . Influenza, High Dose Seasonal PF 01/24/2017, 01/08/2018  . Influenza, Seasonal, Injecte, Preservative Fre  11/07/2009, 11/30/2010, 11/25/2011  . Influenza,inj,Quad PF,6+ Mos 12/22/2012, 01/20/2014, 11/19/2019  . Influenza-Unspecified 11/13/2015  . PFIZER(Purple Top)SARS-COV-2 Vaccination 03/31/2019, 04/28/2019, 11/15/2019  . Pneumococcal Conjugate-13 06/24/2013  . Pneumococcal Polysaccharide-23 08/08/2017, 10/27/2018  . Pneumococcal-Unspecified 11/25/2000   Pertinent  Health Maintenance Due  Topic Date Due  . FOOT EXAM  Never done  . OPHTHALMOLOGY EXAM  Never done  . URINE MICROALBUMIN  Never done  . DEXA SCAN  Never done  . HEMOGLOBIN A1C  08/29/2020  . INFLUENZA VACCINE  Completed  . PNA vac Low Risk Adult  Completed   No flowsheet data found. Functional Status Survey:    Vitals:   04/25/20 1618  BP: 133/76  Pulse: 78  Resp: 17  Temp: (!) 97 F (36.1 C)  SpO2: 94%  Weight: 124 lb (56.2 kg)  Height: 5' 4"  (1.626 m)   Body mass index is 21.28 kg/m. Physical Exam Vitals reviewed.  Cardiovascular:     Rate and Rhythm: Normal rate and regular rhythm.     Pulses: Normal pulses.     Heart sounds: Normal heart sounds.  Pulmonary:     Effort: Pulmonary effort is normal. No respiratory distress.     Breath sounds: Normal breath sounds. No wheezing.  Abdominal:     General: Bowel sounds are normal. There is no distension.     Palpations: Abdomen is soft. There is no mass.     Tenderness: There is abdominal tenderness in the right lower quadrant and left lower quadrant. There is guarding.     Hernia: No hernia is present.  Skin:    General: Skin is warm and dry.     Capillary Refill: Capillary refill takes less than 2 seconds.  Neurological:     General: No focal deficit present.     Mental Status: She is easily aroused. Mental status is at baseline.     Motor: Weakness present.  Psychiatric:        Mood and Affect: Mood normal.        Behavior: Behavior normal.     Labs reviewed: Recent Labs    03/08/20 0542 03/09/20 0454 03/10/20 0449  NA 138 137 139  K 4.3 3.9  4.9  CL 104 104 101  CO2 24 24 27   GLUCOSE 259* 220* 149*  BUN 19 20 16   CREATININE 0.64 0.72 0.71  CALCIUM 8.3* 8.3* 8.7*  MG  --  1.7 2.1   Recent Labs    03/06/20 0610 03/07/20 0516 03/08/20 0542  AST 13* 16 26  ALT 11 11 11  ALKPHOS 42 45 59  BILITOT 0.4 0.4 0.5  PROT 5.3* 5.5* 5.4*  ALBUMIN 2.3* 2.3* 2.3*   Recent Labs    03/07/20 0516 03/08/20 0542 03/09/20 0454  WBC 12.6* 10.9* 11.1*  NEUTROABS 9.4* 8.3* 7.2  HGB 10.1* 9.9* 9.9*  HCT 31.0* 30.3* 30.5*  MCV 101.6* 101.7* 100.7*  PLT 499* 500* 454*   Lab Results  Component Value Date   TSH 10.50 (A) 04/15/2020   Lab Results  Component Value Date   HGBA1C 6.3 (H) 03/01/2020   No results found for: CHOL, HDL, LDLCALC, LDLDIRECT, TRIG, CHOLHDL  Significant Diagnostic Results in last 30 days:  No results found.  Assessment/Plan 1. Right lower quadrant abdominal pain - tenderness with palpation over LLQ and RLQ, appetite poor - suspect her diverticulitis has returned - STAT CT scan of abdomen with contrast - STAT cbc/diff- WBC 11, was 7.9 in February - STAT bmp - start IV due to poor po intake - give NS @ 100cc/hr x 1 bag  2. Sigmoid diverticulitis - same as above - followed by palliative  - care goals discussed with daughter    Family/ staff Communication: Plan discussed with patient, daughter and facility nurse  Labs/tests ordered:  Stat cbc/diff, bmp

## 2020-04-26 ENCOUNTER — Other Ambulatory Visit: Payer: Self-pay | Admitting: Orthopedic Surgery

## 2020-04-26 ENCOUNTER — Other Ambulatory Visit: Payer: Self-pay | Admitting: Internal Medicine

## 2020-04-26 ENCOUNTER — Ambulatory Visit
Admission: RE | Admit: 2020-04-26 | Discharge: 2020-04-26 | Disposition: A | Discharge status: Home / Self Care | Payer: Medicare Other | Source: Ambulatory Visit | Attending: Internal Medicine | Admitting: Internal Medicine

## 2020-04-26 ENCOUNTER — Non-Acute Institutional Stay (SKILLED_NURSING_FACILITY): Payer: Medicare Other | Admitting: Orthopedic Surgery

## 2020-04-26 ENCOUNTER — Encounter: Payer: Self-pay | Admitting: Orthopedic Surgery

## 2020-04-26 DIAGNOSIS — K5732 Diverticulitis of large intestine without perforation or abscess without bleeding: Secondary | ICD-10-CM

## 2020-04-26 DIAGNOSIS — E876 Hypokalemia: Secondary | ICD-10-CM

## 2020-04-26 DIAGNOSIS — L0291 Cutaneous abscess, unspecified: Secondary | ICD-10-CM

## 2020-04-26 MED ORDER — IOPAMIDOL (ISOVUE-300) INJECTION 61%
100.0000 mL | Freq: Once | INTRAVENOUS | Status: AC | PRN
  Administered 2020-04-26: 100 mL via INTRAVENOUS

## 2020-04-26 NOTE — Progress Notes (Signed)
Location:  [facility] Room Number: 498/Y Place of Service:  SNF [phone]) Provider: Windell Moulding, AGNP-C  [name], PA-C  Patient Care Team: [name], PA-C as PCP - General (Family Medicine) System, Provider Not In  Extended Emergency Contact Information Primary Emergency Contact: [name],[name] Address: [place], [place] [address] [name] of [place] Phone: [phone] Mobile Phone: [phone] Relation: Daughter Secondary Emergency Contact: [name] of [place] Phone: [phone] Relation: Son  Code Status: DNR Goals of care: Advanced Directive information Advanced Directives 04/25/2020  Does Patient Have a Medical Advance Directive? Yes  Type of Advance Directive Living will  Does patient want to make changes to medical advance directive? No - Patient declined  Copy of [place] in Chart? -  Would patient like information on creating a medical advance directive? -  Pre-existing out of facility DNR order (yellow form or pink MOST form) Pink MOST form placed in chart (order not valid for inpatient use)     Chief Complaint  Patient presents with  . Acute Visit    hypokalemia    HPI:  Pt is a 87 y.o. female seen today for acute visit for low potassium.   Yesterday, she was seen for increased abdominal pain. Stat labs revealed elevated WBC and monocyte count. Potassium was also low at 3.3.   Today, she is alert and more responsive during our encounter. She was able to eat 1/2 her breakfast this morning. She does not rate abdominal pain but states "yes" when asked if she has abdominal pain.   CT abdomen scheduled this morning by facility coordinator.   Facility nurse does not report any concerns, vitals stable.    Past Medical History:  Diagnosis Date  . Arthritis   . Cataract   . Chronic cough   . COPD (chronic obstructive pulmonary disease)  ([facility])   . Diabetes mellitus without complication ([facility])   . Diverticulitis   . Diverticulosis   . GERD (gastroesophageal reflux disease)   . Hepatic steatosis   . Hiatal hernia   . Hypothyroidism   . ILD (interstitial lung disease) ([facility])   . Pulmonary fibrosis ([facility])   . Thyroid disease   . Vertigo    Past Surgical History:  Procedure Laterality Date  . APPENDECTOMY    . INTRAOCULAR LENS INSERTION  1997  . REPLACEMENT TOTAL KNEE BILATERAL    . SHOULDER SURGERY Right    tendonitis    Allergies  Allergen Reactions  . Oxycodone-Acetaminophen Nausea Only  . Penicillins Hives and Rash    Has patient had a PCN reaction causing immediate rash, facial/tongue/throat swelling, SOB or lightheadedness with hypotension: no Has patient had a PCN reaction causing severe rash involving mucus membranes or skin necrosis: unknown Has patient had a PCN reaction that required hospitalization : unknown Has patient had a PCN reaction occurring within the last 10 years: no If all of the above answers are "NO", then may proceed with Cephalosporin use.   . Codeine     Nausea and vomiting  . Percocet [Oxycodone-Acetaminophen] Nausea And Vomiting  . Tramadol Nausea And Vomiting    Outpatient Encounter Medications as of 04/26/2020  Medication Sig  . acetaminophen (TYLENOL) 500 MG tablet Take 1,000 mg by mouth in the morning and at bedtime.  Marland Kitchen albuterol (VENTOLIN HFA) 108 (90 Base) MCG/ACT inhaler Inhale 2 puffs into the lungs every 6 (six)  hours as needed for wheezing or shortness of breath.  Marland Kitchen ammonium lactate (LAC-HYDRIN) 12 % lotion Apply 1 application topically daily as needed for dry skin.   . Ascorbic Acid (VITAMIN C) 1000 MG tablet Take 1,000 mg by mouth daily.  . Cholecalciferol (VITAMIN D-3) 25 MCG (1000 UT) CAPS Take 2,000 Units by mouth daily.  . diclofenac (FLECTOR) 1.3 % PTCH Place 1 patch onto the skin 2 (two) times daily. Right wrist and neck twice daily for pain  .  diphenoxylate-atropine (LOMOTIL) 2.5-0.025 MG tablet Take 2 tablets by mouth 4 (four) times daily as needed for diarrhea or loose stools. diarrhea  . feeding supplement (BOOST / RESOURCE BREEZE) LIQD Take 1 Container by mouth 3 (three) times daily. To promote wound healing  . glycopyrrolate (ROBINUL) 2 MG tablet Take 1 tablet (2 mg total) by mouth in the morning.  Marland Kitchen levothyroxine (SYNTHROID) 88 MCG tablet Take 1 tablet (88 mcg total) by mouth daily before breakfast.  . metFORMIN (GLUCOPHAGE) 500 MG tablet Take 1 tablet by mouth daily.  . mirtazapine (REMERON) 15 MG tablet Take 15 mg by mouth at bedtime. For appetite  . Multiple Vitamin (MULTIVITAMIN WITH MINERALS) TABS tablet Take 1 tablet by mouth daily.  . Naphazoline-Glycerin (CLEAR EYES REDNESS RELIEF OP) Place 2 drops into the right eye 4 (four) times daily as needed. For Eye Irritation  . omeprazole (PRILOSEC) 40 MG capsule Take 1 capsule (40 mg total) by mouth daily.  . ondansetron (ZOFRAN) 4 MG tablet Take 4 mg by mouth every 6 (six) hours as needed for nausea or vomiting.  . OXYGEN Inhale 2 L into the lungs as needed. To maintain O2 saturation of 90%  ( Shortness of Breath)  . phenylephrine-shark liver oil-mineral oil-petrolatum (PREPARATION H) 0.25-14-74.9 % rectal ointment Place 1 application rectally 4 (four) times daily as needed for hemorrhoids.  . polyethylene glycol (MIRALAX / GLYCOLAX) 17 g packet Take 17 g by mouth daily.  Marland Kitchen senna-docusate (SENOKOT-S) 8.6-50 MG tablet Take 1 tablet by mouth 2 (two) times daily.  . Zinc 50 MG CAPS Take 50 mg by mouth daily.   No facility-administered encounter medications on file as of 04/26/2020.    Review of Systems  Constitutional: Positive for appetite change. Negative for activity change, fatigue and fever.  HENT: Positive for hearing loss.   Respiratory: Negative for cough, shortness of breath and wheezing.   Cardiovascular: Negative for chest pain and leg swelling.  Gastrointestinal:  Positive for abdominal pain. Negative for abdominal distention, constipation, diarrhea and nausea.  Psychiatric/Behavioral: Negative for confusion and dysphoric mood. The patient is not nervous/anxious.     Immunization History  Administered Date(s) Administered  . DT (Pediatric) 02/04/1978, 02/05/1988  . Influenza Split 11/03/2018  . Influenza Whole 12/09/2006  . Influenza, High Dose Seasonal PF 01/24/2017, 01/08/2018  . Influenza, Seasonal, Injecte, Preservative Fre 11/07/2009, 11/30/2010, 11/25/2011  . Influenza,inj,Quad PF,6+ Mos 12/22/2012, 01/20/2014, 11/19/2019  . Influenza-Unspecified 11/13/2015  . PFIZER(Purple Top)SARS-COV-2 Vaccination 03/31/2019, 04/28/2019, 11/15/2019  . Pneumococcal Conjugate-13 06/24/2013  . Pneumococcal Polysaccharide-23 08/08/2017, 10/27/2018  . Pneumococcal-Unspecified 11/25/2000   Pertinent  Health Maintenance Due  Topic Date Due  . FOOT EXAM  Never done  . OPHTHALMOLOGY EXAM  Never done  . URINE MICROALBUMIN  Never done  . DEXA SCAN  Never done  . HEMOGLOBIN A1C  08/29/2020  . INFLUENZA VACCINE  Completed  . PNA vac Low Risk Adult  Completed   No flowsheet data found. Functional Status Survey:  Vitals:   04/26/20 1019  BP: (!) 122/49  Pulse: 69  Resp: 18  Temp: (!) 97.5 F (36.4 C)  Weight: 124 lb (56.2 kg)   Body mass index is 21.28 kg/m. Physical Exam Vitals reviewed.  Cardiovascular:     Rate and Rhythm: Normal rate and regular rhythm.     Pulses: Normal pulses.     Heart sounds: Normal heart sounds. No murmur heard.   Pulmonary:     Effort: Pulmonary effort is normal. No respiratory distress.     Breath sounds: Normal breath sounds. No wheezing.  Abdominal:     General: Bowel sounds are normal.     Palpations: Abdomen is soft. There is no mass.     Tenderness: There is abdominal tenderness in the right lower quadrant and left lower quadrant.     Hernia: No hernia is present.  Skin:    General: Skin is warm and dry.      Capillary Refill: Capillary refill takes less than 2 seconds.  Neurological:     General: No focal deficit present.     Mental Status: She is alert. Mental status is at baseline.     Motor: Weakness present.     Gait: Gait abnormal.  Psychiatric:        Mood and Affect: Mood normal.        Behavior: Behavior normal.     Labs reviewed: Recent Labs    03/08/20 0542 03/09/20 0454 03/10/20 0449  NA 138 137 139  K 4.3 3.9 4.9  CL 104 104 101  CO2 24 24 27   GLUCOSE 259* 220* 149*  BUN 19 20 16   CREATININE 0.64 0.72 0.71  CALCIUM 8.3* 8.3* 8.7*  MG  --  1.7 2.1   Recent Labs    03/06/20 0610 03/07/20 0516 03/08/20 0542  AST 13* 16 26  ALT 11 11 11   ALKPHOS 42 45 59  BILITOT 0.4 0.4 0.5  PROT 5.3* 5.5* 5.4*  ALBUMIN 2.3* 2.3* 2.3*   Recent Labs    03/07/20 0516 03/08/20 0542 03/09/20 0454  WBC 12.6* 10.9* 11.1*  NEUTROABS 9.4* 8.3* 7.2  HGB 10.1* 9.9* 9.9*  HCT 31.0* 30.3* 30.5*  MCV 101.6* 101.7* 100.7*  PLT 499* 500* 454*   Lab Results  Component Value Date   TSH 10.50 (A) 04/15/2020   Lab Results  Component Value Date   HGBA1C 6.3 (H) 03/01/2020   No results found for: CHOL, HDL, LDLCALC, LDLDIRECT, TRIG, CHOLHDL  Significant Diagnostic Results in last 30 days:  No results found.  Assessment/Plan 1. Hypokalemia - K 3.3 - will start potassium chloride 40 meq po daily x 3 days  - repeat bmp 03/28  2. Sigmoid diverticulitis - WBC elevated to 11, was 7.9 - monocytes elevated to 1.3 - suspect diverticulitis has returned - will notify palliative to discuss long term care options - start cefdinir 300 mg po bid x 14 days - repeat cbc/diff 03/28    Family/ staff Communication: plan discussed with daughter and facility nurse   Labs/tests ordered: cbc/diff, bmp

## 2020-04-27 ENCOUNTER — Non-Acute Institutional Stay (SKILLED_NURSING_FACILITY): Payer: Medicare Other | Admitting: Orthopedic Surgery

## 2020-04-27 ENCOUNTER — Encounter: Payer: Self-pay | Admitting: Orthopedic Surgery

## 2020-04-27 DIAGNOSIS — K651 Peritoneal abscess: Secondary | ICD-10-CM | POA: Diagnosis not present

## 2020-04-27 DIAGNOSIS — K5732 Diverticulitis of large intestine without perforation or abscess without bleeding: Secondary | ICD-10-CM | POA: Diagnosis not present

## 2020-04-27 NOTE — Progress Notes (Signed)
Location:    [facility] Room Number: 283-T Place of Service:  SNF [phone]) Provider: Windell Moulding NP   [name], PA-C  Patient Care Team: [name], PA-C as PCP - General (Family Medicine) System, Provider Not In  Extended Emergency Contact Information Primary Emergency Contact: [name],[name] Address: [place], [place] [address] [name] of [place] Phone: [phone] Mobile Phone: [phone] Relation: Daughter Secondary Emergency Contact: [name] of [place] Mobile Phone: [phone] Relation: Son  Code Status: Full Code  Goals of care: Advanced Directive information Advanced Directives 04/27/2020  Does Patient Have a Medical Advance Directive? Yes  Type of Advance Directive Living will  Does patient want to make changes to medical advance directive? No - Patient declined  Copy of [place] in Chart? -  Would patient like information on creating a medical advance directive? -  Pre-existing out of facility DNR order (yellow form or pink MOST form) Pink MOST form placed in chart (order not valid for inpatient use)     Chief Complaint  Patient presents with  . Acute Visit    Abdominal Pain / Fall    HPI:  Pt is a 87 y.o. female seen today for an acute visit for abdominal pain.   She has been seen the last two days for unresolved abdominal pain. CT abdomen/pelvis revealed return of diverticular abscess, 5.5 x 7.0 cm in size. Oral Cefdinir and flagyl restarted. Treatment options discussed with daughter.   Facility nurse reports fall last night. She was trying to use walker to bathroom. No injuries occurred.   Today, she is alert during encounter. Poor hearing limits interaction. She continues to say "yes" to abdominal pain. She drank 1 glass of water and part of muffin.   Vitals stable, remains afebrile.    Past Medical History:  Diagnosis Date  . Arthritis    . Cataract   . Chronic cough   . COPD (chronic obstructive pulmonary disease) ([facility])   . Diabetes mellitus without complication ([facility])   . Diverticulitis   . Diverticulosis   . GERD (gastroesophageal reflux disease)   . Hepatic steatosis   . Hiatal hernia   . Hypothyroidism   . ILD (interstitial lung disease) ([facility])   . Pulmonary fibrosis ([facility])   . Thyroid disease   . Vertigo    Past Surgical History:  Procedure Laterality Date  . APPENDECTOMY    . INTRAOCULAR LENS INSERTION  1997  . REPLACEMENT TOTAL KNEE BILATERAL    . SHOULDER SURGERY Right    tendonitis    Allergies  Allergen Reactions  . Oxycodone-Acetaminophen Nausea Only  . Penicillins Hives and Rash    Has patient had a PCN reaction causing immediate rash, facial/tongue/throat swelling, SOB or lightheadedness with hypotension: no Has patient had a PCN reaction causing severe rash involving mucus membranes or skin necrosis: unknown Has patient had a PCN reaction that required hospitalization : unknown Has patient had a PCN reaction occurring within the last 10 years: no If all of the above answers are "NO", then may proceed with Cephalosporin use.   . Codeine     Nausea and vomiting  . Percocet [Oxycodone-Acetaminophen] Nausea And Vomiting  . Tramadol Nausea And Vomiting    Allergies as of 04/27/2020      Reactions   Oxycodone-acetaminophen Nausea Only   Penicillins Hives, Rash   Has patient had a PCN reaction  causing immediate rash, facial/tongue/throat swelling, SOB or lightheadedness with hypotension: no Has patient had a PCN reaction causing severe rash involving mucus membranes or skin necrosis: unknown Has patient had a PCN reaction that required hospitalization : unknown Has patient had a PCN reaction occurring within the last 10 years: no If all of the above answers are "NO", then may proceed with Cephalosporin use.   Codeine    Nausea and vomiting   Percocet [oxycodone-acetaminophen] Nausea And  Vomiting   Tramadol Nausea And Vomiting      Medication List       Accurate as of April 27, 2020  9:15 AM. If you have any questions, ask your nurse or doctor.        acetaminophen 500 MG tablet Commonly known as: TYLENOL Take 1,000 mg by mouth in the morning and at bedtime.   albuterol 108 (90 Base) MCG/ACT inhaler Commonly known as: VENTOLIN HFA Inhale 2 puffs into the lungs every 6 (six) hours as needed for wheezing or shortness of breath.   ammonium lactate 12 % lotion Commonly known as: LAC-HYDRIN Apply 1 application topically daily as needed for dry skin.   cefdinir 300 MG capsule Commonly known as: OMNICEF Take 300 mg by mouth 2 (two) times daily. For 14 days   CLEAR EYES REDNESS RELIEF OP Place 2 drops into the right eye 4 (four) times daily as needed. For Eye Irritation   diclofenac 1.3 % Ptch Commonly known as: FLECTOR Place 1 patch onto the skin 2 (two) times daily. Right wrist and neck twice daily for pain   diphenoxylate-atropine 2.5-0.025 MG tablet Commonly known as: LOMOTIL Take 2 tablets by mouth 4 (four) times daily as needed for diarrhea or loose stools. diarrhea   feeding supplement Liqd Commonly known as: BOOST / RESOURCE BREEZE Take 1 Container by mouth 3 (three) times daily. To promote wound healing   glycopyrrolate 2 MG tablet Commonly known as: ROBINUL Take 1 tablet (2 mg total) by mouth in the morning.   levothyroxine 88 MCG tablet Commonly known as: SYNTHROID Take 1 tablet (88 mcg total) by mouth daily before breakfast.   metFORMIN 500 MG tablet Commonly known as: GLUCOPHAGE Take 1 tablet by mouth daily.   mirtazapine 15 MG tablet Commonly known as: REMERON Take 15 mg by mouth at bedtime. For appetite   multivitamin with minerals Tabs tablet Take 1 tablet by mouth daily.   omeprazole 40 MG capsule Commonly known as: PRILOSEC Take 1 capsule (40 mg total) by mouth daily.   ondansetron 4 MG tablet Commonly known as: ZOFRAN Take  4 mg by mouth every 6 (six) hours as needed for nausea or vomiting.   OXYGEN Inhale 2 L into the lungs as needed. To maintain O2 saturation of 90%  ( Shortness of Breath)   polyethylene glycol 17 g packet Commonly known as: MIRALAX / GLYCOLAX Take 17 g by mouth daily.   potassium chloride SA 20 MEQ tablet Commonly known as: KLOR-CON Take 40 mEq by mouth daily. For 3 days   Preparation H 0.25-14-74.9 % rectal ointment Generic drug: phenylephrine-shark liver oil-mineral oil-petrolatum Place 1 application rectally 4 (four) times daily as needed for hemorrhoids.   senna-docusate 8.6-50 MG tablet Commonly known as: Senokot-S Take 1 tablet by mouth 2 (two) times daily.   sodium chloride 0.9 % Soln Give one liters NS @ 100 ml/hr   vitamin C 1000 MG tablet Take 1,000 mg by mouth daily.   Vitamin D-3 25 MCG (1000 UT)  Caps Take 2,000 Units by mouth daily.   Zinc 50 MG Caps Take 50 mg by mouth daily.       Review of Systems  Constitutional: Positive for fatigue. Negative for activity change, appetite change and fever.  HENT: Positive for hearing loss.   Respiratory: Negative for cough, choking and wheezing.   Cardiovascular: Negative for chest pain and leg swelling.  Gastrointestinal: Positive for abdominal pain. Negative for abdominal distention, constipation, diarrhea and nausea.  Musculoskeletal: Positive for gait problem.       Fall  Psychiatric/Behavioral: Negative for confusion and dysphoric mood. The patient is not nervous/anxious.     Immunization History  Administered Date(s) Administered  . DT (Pediatric) 02/04/1978, 02/05/1988  . Influenza Split 11/03/2018  . Influenza Whole 12/09/2006  . Influenza, High Dose Seasonal PF 01/24/2017, 01/08/2018  . Influenza, Seasonal, Injecte, Preservative Fre 11/07/2009, 11/30/2010, 11/25/2011  . Influenza,inj,Quad PF,6+ Mos 12/22/2012, 01/20/2014, 11/19/2019  . Influenza-Unspecified 11/13/2015  . PFIZER(Purple Top)SARS-COV-2  Vaccination 03/31/2019, 04/28/2019, 11/15/2019  . Pneumococcal Conjugate-13 06/24/2013  . Pneumococcal Polysaccharide-23 08/08/2017, 10/27/2018  . Pneumococcal-Unspecified 11/25/2000   Pertinent  Health Maintenance Due  Topic Date Due  . FOOT EXAM  Never done  . OPHTHALMOLOGY EXAM  Never done  . URINE MICROALBUMIN  Never done  . DEXA SCAN  Never done  . HEMOGLOBIN A1C  08/29/2020  . INFLUENZA VACCINE  Completed  . PNA vac Low Risk Adult  Completed   No flowsheet data found. Functional Status Survey:    Vitals:   04/27/20 0906  BP: 110/68  Pulse: 62  Resp: 18  Temp: 97.7 F (36.5 C)  SpO2: 94%  Weight: 124 lb (56.2 kg)  Height: 5' 4"  (1.626 m)   Body mass index is 21.28 kg/m. Physical Exam Vitals reviewed.  Constitutional:      General: She is not in acute distress. Cardiovascular:     Rate and Rhythm: Normal rate and regular rhythm.     Pulses: Normal pulses.     Heart sounds: Normal heart sounds. No murmur heard.   Pulmonary:     Effort: Pulmonary effort is normal. No respiratory distress.     Breath sounds: Normal breath sounds. No wheezing.  Abdominal:     General: Abdomen is flat. Bowel sounds are normal. There is no distension.     Palpations: Abdomen is soft.     Tenderness: There is abdominal tenderness.  Genitourinary:    Comments: Foley- dark urine Musculoskeletal:     Right lower leg: No edema.     Left lower leg: No edema.  Skin:    General: Skin is warm and dry.     Capillary Refill: Capillary refill takes less than 2 seconds.  Neurological:     General: No focal deficit present.     Mental Status: She is alert and oriented to person, place, and time.     Motor: Weakness present.     Gait: Gait abnormal.  Psychiatric:        Mood and Affect: Mood normal.        Behavior: Behavior normal.     Labs reviewed: Recent Labs    03/08/20 0542 03/09/20 0454 03/10/20 0449  NA 138 137 139  K 4.3 3.9 4.9  CL 104 104 101  CO2 24 24 27    GLUCOSE 259* 220* 149*  BUN 19 20 16   CREATININE 0.64 0.72 0.71  CALCIUM 8.3* 8.3* 8.7*  MG  --  1.7 2.1   Recent Labs  03/06/20 0610 03/07/20 0516 03/08/20 0542  AST 13* 16 26  ALT 11 11 11   ALKPHOS 42 45 59  BILITOT 0.4 0.4 0.5  PROT 5.3* 5.5* 5.4*  ALBUMIN 2.3* 2.3* 2.3*   Recent Labs    03/07/20 0516 03/08/20 0542 03/09/20 0454  WBC 12.6* 10.9* 11.1*  NEUTROABS 9.4* 8.3* 7.2  HGB 10.1* 9.9* 9.9*  HCT 31.0* 30.3* 30.5*  MCV 101.6* 101.7* 100.7*  PLT 499* 500* 454*   Lab Results  Component Value Date   TSH 10.50 (A) 04/15/2020   Lab Results  Component Value Date   HGBA1C 6.3 (H) 03/01/2020   No results found for: CHOL, HDL, LDLCALC, LDLDIRECT, TRIG, CHOLHDL  Significant Diagnostic Results in last 30 days:  CT Abdomen Pelvis W Contrast  Result Date: 04/27/2020 CLINICAL DATA:  87 year old female with abdominal pain. EXAM: CT ABDOMEN AND PELVIS WITH CONTRAST TECHNIQUE: Multidetector CT imaging of the abdomen and pelvis was performed using the standard protocol following bolus administration of intravenous contrast. CONTRAST:  171m ISOVUE-300 IOPAMIDOL (ISOVUE-300) INJECTION 61% COMPARISON:  CT abdomen pelvis dated 02/28/2020. FINDINGS: Lower chest: Emphysematous changes and chronic interstitial coarsening of the visualized lung bases. There is coronary vascular calcification. No intra-abdominal free air or free fluid. Hepatobiliary: Subcentimeter right hepatic hypodense focus is too small to characterize. Probable mild fatty liver. No intrahepatic biliary ductal dilatation. The gallbladder is unremarkable. Pancreas: The pancreas is atrophic.  No active inflammatory changes. Spleen: Normal in size without focal abnormality. Adrenals/Urinary Tract: The adrenal glands unremarkable. There is no hydronephrosis on either side. There is symmetric enhancement and excretion of contrast by both kidneys. The visualized ureters appear unremarkable. The urinary bladder is  decompressed around a Foley catheter. Stomach/Bowel: There is sigmoid diverticulosis with muscular hypertrophy. There is a complex collection in the posterior pelvis containing air and fluid which appear contiguous with the sigmoid colon most consistent with diverticular abscess. This collection measures approximately 5.5 x 7.0 cm in greatest axial dimensions (68/2). Overall there has been interval increase in the size of this collection compared to the prior CT. This collection abuts the posterior uterus and extends posteriorly to the presacral space. There is no bowel obstruction.  Appendectomy. Vascular/Lymphatic: Advanced aortoiliac atherosclerotic disease. The IVC is unremarkable. No portal venous gas. There is no adenopathy. Reproductive: The uterus is grossly unremarkable. Other: None Musculoskeletal: Osteopenia with degenerative changes of the spine. No acute osseous pathology. IMPRESSION: 1. Interval increase in the size of the pelvic diverticular abscess. 2. Aortic Atherosclerosis (ICD10-I70.0). Electronically Signed   By: AAnner CreteM.D.   On: 04/27/2020 01:25    Assessment/Plan 1. Sigmoid diverticulitis -CT abdomen/pelvis reveals abscess 5.5 x 7.0 cm - she continues to have lower abdominal pain - Cefdinir 300 po bid x 14 days started 03/23 - will add flagyl 500 mg po tid x 14 days - recommend bowel rest- clear liquid diet x 2 days then advance to soft/ low fiber diet   2. Intra-abdominal abscess ([place] -same as above - followed by palliative - I reached out to IR this morning about drain options, at this time they do not believe she is a candidate, recommend surgical consult - daughter does not wish to put mother through surgery - message left for Latoya with Palliative to discuss treatment options    Family/ staff Communication: plan discussed with daughter and facility nurse  Labs/tests ordered:  Cbc/diff, bmp scheduled for 05/01/20

## 2020-04-28 ENCOUNTER — Telehealth: Payer: Self-pay | Admitting: Student

## 2020-04-28 NOTE — Telephone Encounter (Signed)
Palliative NP spoke with daughter Eunice Blase regarding abscess/abdominal pain. Patient receiving antibiotics; follow up lab work scheduled on Monday. Palliative NP will see patient Tuesday. Will discuss goals of care going forward. Eunice Blase is encouraged to call with any questions or concerns.

## 2020-04-28 NOTE — Telephone Encounter (Signed)
Late entry:  IR was contacted yesterday 04/27/20 by medical staff at [facility] regarding patient's abdominal pain and recent CT imaging demonstrating an enlarging pelvic diverticular abscess. Dr. Serafina Royals reviewed the CT and his assessment is that the abscess identified is partly a walled up colonic rupture and what is measured on the CT includes the abscess plus normal sigmoid colon. It's difficult to determine what is abscess and what is normal colon; no definitive target for an abscess drain. IR recommends surgery consult. This information was shared with Shelia Moulding, NP.  IR consulted previously on 03/01/20 for diverticular abscess. No procedure was performed due to lack of safe percutaneous window.   Shelia Black, [place] [phone] 04/28/2020, 8:48 AM

## 2020-05-02 ENCOUNTER — Other Ambulatory Visit: Payer: Self-pay

## 2020-05-02 ENCOUNTER — Non-Acute Institutional Stay: Payer: Medicare Other | Admitting: Student

## 2020-05-02 DIAGNOSIS — Z515 Encounter for palliative care: Secondary | ICD-10-CM

## 2020-05-02 NOTE — Progress Notes (Signed)
[facility] Consult Note Telephone: [phone]  Fax: [phone]  PATIENT NAME: Shelia Black [address] [place] [address] [phone] (home)  DOB: 1921-07-08 MRN: [id number]  PRIMARY CARE PROVIDER:    Lenetta Black  [place] [address] [phone]  REFERRING PROVIDER:   [name], PA-C [place],  [place] [address] [phone]  RESPONSIBLE PARTY:   Extended Emergency Contact Information Primary Emergency Contact: Shelia Black Address: [place], [place] [address] [name] of [place] Phone: [phone] Mobile Phone: [phone] Relation: Daughter Secondary Emergency Contact: Shelia Black States of [place] Phone: [phone] Relation: Son  I met face to face with patient and family in home/facility. Patient present but unable to substantively engage in process.   ASSESSMENT AND RECOMMENDATIONS:   Advance Care Planning: Visit at the request of Dr. Keenan Bachelor for palliative consult. Visit consisted of building trust and discussions on Palliative care medicine as specialized medical care for people living with serious illness, aimed at facilitating improved quality of life through symptoms relief, assisting with advance care planning and establishing goals of care. Education provided on Palliative vs. Hospice services. Palliative care will continue to provide support to patient, family and the medical team.  Patient continues with physical decline, poor appetite. She has recurrent diverticular abscess; not a surgical candidate. Discussed with daughter Shelia Black; she expresses comfort for patient. She is agreeable to hospice services. Discussed with hospice physician and facility NP [facility]. Patient being referred for Hospice evaluation.  Goal of care: For patient to be comfortable.   Directives: MOST form; reviewed,  no changes.   Symptom Management:   Recurrent diverticular abscess-patient with recurrent diverticular abscess and c/o abdominal pain. Patient is not a surgical candidate. She is currently on antibiotics; continue cefdinir and flagyl x 14 days as directed. Progressed for liquid diet to soft diet as tolerated. Pain-continue acetaminophen 1030m BID; assess for further intervention.  Weakness-patient with generalized weakness; staff to continue to provide assistance with all adl's.   Appetite-poor appetite continues; continue mirtazapine 7.582mdaily. Offer foods that patient enjoys.   Follow up Palliative Care Visit: Palliative care will continue to follow for complex decision making and symptom management. Return prn.  Family /Caregiver/Community Supports: Palliative Medicine will continue to follow patient through admission to hospice services.   I spent 35 minutes providing this consultation, time includes time spent with patient/family, chart review, provider coordination, and documentation. More than 50% of the time in this consultation was spent counseling and coordinating communication.   CHIEF COMPLAINT: Palliative Medicine follow up visit.   History obtained from review of EMR, discussion with primary team, and  interview with family. Records reviewed and summarized below.  HISTORY OF PRESENT ILLNESS:  Shelia Black a [age].o. year old female with multiple medical problems including COPD,interstitial lung disease, pulmonary fibrosis,arthritis, diabetes, diverticulosis,diverticulitis, GERD,thyroid disease. Patient recently hospitalized 1/24-03/10/2020 due to diverticulitis. Palliative Care was asked to follow this patient by consultation request of Dr. ReMariea Clontsto help address advance care planning and goals of care. This is a follow up visit.  Patient had CT scan last week due to unresolved abdominal pain. CT scan revealed recurrent diverticular abscess, measuring 5.5.cm x 7.0cm.  patient was started on PO cefdinir and flagyl. She is not a surgical candidate. Patient has completed therapy at facility. Her appetite has declined again; she is eating 0- 25%  of meals. She has a reopened stage III wound to sacrum; wound care per facility staff. She had been ambulating, now with LE weakness, primarily in bed. Fall last week per staff where she attempted to ambulate to bathroom unassisted. Patient denies pain at present. She is sleeping throughout day/night.   CODE STATUS: DNR  PPS: 30%   ROS Patient unable to substantially contribute to ROS.    Physical Exam:  Weight 124 pounds on 04/12/20; 128 pounds 03/16/20 Pulse 76, resp 16, b/p 110/58, sats 96% on room air Constitutional: NAD, sleeps through most of visit, answers direct questions General: frail appearing, thin EYES: anicteric sclera, lids intact, no discharge  ENMT: hard of hearing,oral mucous membranes moist CV: RRR, no LE edema Pulmonary: LCTA; no increased work of breathing, no cough Abdomen: bowel sounds normoactive x 4 GU: foley catheter; amber urine in drainage bag MSK: sarcopenia, decreased ROM in all extremities, non ambulatory Skin: warm and dry, no rashes or wounds on visible skin; sacral wound not visualized Neuro: Generalized weakness Hem/lymph/immuno: no widespread bruising   PAST MEDICAL HISTORY:  Past Medical History:  Diagnosis Date  . Arthritis   . Cataract   . Chronic cough   . COPD (chronic obstructive pulmonary disease) ([place])   . Diabetes mellitus without complication ([place])   . Diverticulitis   . Diverticulosis   . GERD (gastroesophageal reflux disease)   . Hepatic steatosis   . Hiatal hernia   . Hypothyroidism   . ILD (interstitial lung disease) ([place])   . Pulmonary fibrosis ([place])   . Thyroid disease   . Vertigo     SOCIAL HX:  Social History   Tobacco Use  . Smoking status: Never Smoker  . Smokeless tobacco: Never Used  Substance Use Topics  . Alcohol use: No   FAMILY  HX:  Family History  Problem Relation Age of Onset  . Diabetes Mother   . Kidney cancer Mother   . Hypertension Father   . Supraventricular tachycardia Father   . Skin cancer Father   . Diabetes Sister     ALLERGIES:  Allergies  Allergen Reactions  . Oxycodone-Acetaminophen Nausea Only  . Penicillins Hives and Rash    Has patient had a PCN reaction causing immediate rash, facial/tongue/throat swelling, SOB or lightheadedness with hypotension: no Has patient had a PCN reaction causing severe rash involving mucus membranes or skin necrosis: unknown Has patient had a PCN reaction that required hospitalization : unknown Has patient had a PCN reaction occurring within the last 10 years: no If all of the above answers are "NO", then may proceed with Cephalosporin use.   . Codeine     Nausea and vomiting  . Percocet [Oxycodone-Acetaminophen] Nausea And Vomiting  . Tramadol Nausea And Vomiting     PERTINENT MEDICATIONS:  Outpatient Encounter Medications as of 05/02/2020  Medication Sig  . acetaminophen (TYLENOL) 500 MG tablet Take 1,000 mg by mouth in the morning and at bedtime.  Marland Kitchen albuterol (VENTOLIN HFA) 108 (90 Base) MCG/ACT inhaler Inhale 2 puffs into the lungs every 6 (six) hours as needed for wheezing or shortness of breath.  Marland Kitchen ammonium lactate (LAC-HYDRIN) 12 % lotion Apply 1 application topically daily as needed for dry skin.   . Ascorbic Acid (VITAMIN C) 1000 MG tablet Take 1,000 mg by mouth daily.  . cefdinir (OMNICEF) 300 MG capsule Take 300 mg by mouth 2 (two) times daily. For 14 days  . Cholecalciferol (VITAMIN D-3) 25 MCG (1000 UT) CAPS Take 2,000  Units by mouth daily.  . diclofenac (FLECTOR) 1.3 % PTCH Place 1 patch onto the skin 2 (two) times daily. Right wrist and neck twice daily for pain  . diphenoxylate-atropine (LOMOTIL) 2.5-0.025 MG tablet Take 2 tablets by mouth 4 (four) times daily as needed for diarrhea or loose stools. diarrhea  . feeding supplement (BOOST /  RESOURCE BREEZE) LIQD Take 1 Container by mouth 3 (three) times daily. To promote wound healing  . glycopyrrolate (ROBINUL) 2 MG tablet Take 1 tablet (2 mg total) by mouth in the morning.  Marland Kitchen levothyroxine (SYNTHROID) 88 MCG tablet Take 1 tablet (88 mcg total) by mouth daily before breakfast.  . metFORMIN (GLUCOPHAGE) 500 MG tablet Take 1 tablet by mouth daily.  . mirtazapine (REMERON) 15 MG tablet Take 15 mg by mouth at bedtime. For appetite  . Multiple Vitamin (MULTIVITAMIN WITH MINERALS) TABS tablet Take 1 tablet by mouth daily.  . Naphazoline-Glycerin (CLEAR EYES REDNESS RELIEF OP) Place 2 drops into the right eye 4 (four) times daily as needed. For Eye Irritation  . omeprazole (PRILOSEC) 40 MG capsule Take 1 capsule (40 mg total) by mouth daily.  . ondansetron (ZOFRAN) 4 MG tablet Take 4 mg by mouth every 6 (six) hours as needed for nausea or vomiting.  . OXYGEN Inhale 2 L into the lungs as needed. To maintain O2 saturation of 90%  ( Shortness of Breath)  . phenylephrine-shark liver oil-mineral oil-petrolatum (PREPARATION H) 0.25-14-74.9 % rectal ointment Place 1 application rectally 4 (four) times daily as needed for hemorrhoids.  . polyethylene glycol (MIRALAX / GLYCOLAX) 17 g packet Take 17 g by mouth daily.  . potassium chloride SA (KLOR-CON) 20 MEQ tablet Take 40 mEq by mouth daily. For 3 days  . senna-docusate (SENOKOT-S) 8.6-50 MG tablet Take 1 tablet by mouth 2 (two) times daily.  . sodium chloride 0.9 % SOLN Give one liters NS @ 100 ml/hr  . Zinc 50 MG CAPS Take 50 mg by mouth daily.   No facility-administered encounter medications on file as of 05/02/2020.     Thank you for the opportunity to participate in the care of Ms. Winegardner. The palliative care team will continue to follow. Please call our office at [phone] if we can be of additional assistance.  Ezekiel Slocumb, NP

## 2020-05-04 ENCOUNTER — Other Ambulatory Visit: Payer: Self-pay | Admitting: Orthopedic Surgery

## 2020-05-04 ENCOUNTER — Encounter: Payer: Self-pay | Admitting: Orthopedic Surgery

## 2020-05-04 ENCOUNTER — Non-Acute Institutional Stay (SKILLED_NURSING_FACILITY): Payer: Medicare Other | Admitting: Orthopedic Surgery

## 2020-05-04 DIAGNOSIS — R197 Diarrhea, unspecified: Secondary | ICD-10-CM | POA: Diagnosis not present

## 2020-05-04 DIAGNOSIS — K651 Peritoneal abscess: Secondary | ICD-10-CM | POA: Diagnosis not present

## 2020-05-04 DIAGNOSIS — R1032 Left lower quadrant pain: Secondary | ICD-10-CM

## 2020-05-04 MED ORDER — MORPHINE SULFATE (CONCENTRATE) 20 MG/ML PO SOLN: 5.0000 mg | ORAL | 0 refills | Status: AC | PRN

## 2020-05-04 NOTE — Progress Notes (Signed)
Location:  [facility] Room Number: 741/U Place of Service:  SNF [phone]) Provider: Neil Crouch, PA-C  Patient Care Team: [name], PA-C as PCP - General (Family Medicine) System, Provider Not In  Extended Emergency Contact Information Primary Emergency Contact: [name],[name] Address: [place], [place] [address] [name] of [place] Phone: [phone] Mobile Phone: [phone] Relation: Daughter Secondary Emergency Contact: [name] States of [place] Phone: [phone] Relation: Son  Code Status:  DNR Goals of care: Advanced Directive information Advanced Directives 05/04/2020  Does Patient Have a Medical Advance Directive? Yes  Type of Advance Directive Living will  Does patient want to make changes to medical advance directive? No - Patient declined  Copy of [place] in Chart? -  Would patient like information on creating a medical advance directive? -  Pre-existing out of facility DNR order (yellow form or pink MOST form) Pink MOST form placed in chart (order not valid for inpatient use)     Chief Complaint  Patient presents with  . Acute Visit    Diarrhea     HPI:  Pt is a 87 y.o. female seen today for acute visit for diarrhea.   Facility nurse reports increased diarrhea within last 24 hours. She is currently on antibiotics for abdominal abscess and diverticulitis. Episodes of diarrhea unknown. Stool described with pungent odor at times.   Daughter has spoken with [facility], orders for hospice made 03/29.   Today, daughter requesting IV antibiotics given to mother. She states" I want to do everything I can for mom." Goals of care discusses with daughter on phone. I have advised her and her brother to contact palliative nurse to discuss future goals of care.   Facility nurse does not report any concerns, vitals stable.    Past Medical  History:  Diagnosis Date  . Arthritis   . Cataract   . Chronic cough   . COPD (chronic obstructive pulmonary disease) ([place])   . Diabetes mellitus without complication ([place])   . Diverticulitis   . Diverticulosis   . GERD (gastroesophageal reflux disease)   . Hepatic steatosis   . Hiatal hernia   . Hypothyroidism   . ILD (interstitial lung disease) ([place])   . Pulmonary fibrosis ([place])   . Thyroid disease   . Vertigo    Past Surgical History:  Procedure Laterality Date  . APPENDECTOMY    . INTRAOCULAR LENS INSERTION  1997  . REPLACEMENT TOTAL KNEE BILATERAL    . SHOULDER SURGERY Right    tendonitis    Allergies  Allergen Reactions  . Oxycodone-Acetaminophen Nausea Only  . Penicillins Hives and Rash    Has patient had a PCN reaction causing immediate rash, facial/tongue/throat swelling, SOB or lightheadedness with hypotension: no Has patient had a PCN reaction causing severe rash involving mucus membranes or skin necrosis: unknown Has patient had a PCN reaction that required hospitalization : unknown Has patient had a PCN reaction occurring within the last 10 years: no If all of the above answers are "NO", then may proceed with Cephalosporin use.   . Codeine     Nausea and vomiting  . Percocet [Oxycodone-Acetaminophen] Nausea And Vomiting  . Tramadol Nausea And Vomiting    Outpatient Encounter Medications as of 05/04/2020  Medication Sig  . acetaminophen (TYLENOL) 500 MG tablet Take 1,000 mg by mouth in the morning and at bedtime.  Marland Kitchen  albuterol (VENTOLIN HFA) 108 (90 Base) MCG/ACT inhaler Inhale 2 puffs into the lungs every 6 (six) hours as needed for wheezing or shortness of breath.  Marland Kitchen ammonium lactate (LAC-HYDRIN) 12 % lotion Apply 1 application topically daily as needed for dry skin.   . cefdinir (OMNICEF) 300 MG capsule Take 300 mg by mouth 2 (two) times daily. For 14 days  . diclofenac (FLECTOR) 1.3 % PTCH Place 1 patch onto the skin 2 (two) times daily. Right wrist and  neck twice daily for pain  . diphenoxylate-atropine (LOMOTIL) 2.5-0.025 MG tablet Take 2 tablets by mouth 4 (four) times daily as needed for diarrhea or loose stools. diarrhea  . glycopyrrolate (ROBINUL) 2 MG tablet Take 1 tablet (2 mg total) by mouth in the morning.  . metFORMIN (GLUCOPHAGE) 500 MG tablet Take 1 tablet by mouth daily.  . metroNIDAZOLE (FLAGYL) 500 MG tablet Take 500 mg by mouth 3 (three) times daily. For 14 days  . Naphazoline-Glycerin (CLEAR EYES REDNESS RELIEF OP) Place 2 drops into the right eye 4 (four) times daily as needed. For Eye Irritation  . saccharomyces boulardii (FLORASTOR) 250 MG capsule Take 250 mg by mouth daily in the afternoon.  . Ascorbic Acid (VITAMIN C) 1000 MG tablet Take 1,000 mg by mouth daily.  . Cholecalciferol (VITAMIN D-3) 25 MCG (1000 UT) CAPS Take 2,000 Units by mouth daily.  . feeding supplement (BOOST / RESOURCE BREEZE) LIQD Take 1 Container by mouth 3 (three) times daily. To promote wound healing  . levothyroxine (SYNTHROID) 88 MCG tablet Take 1 tablet (88 mcg total) by mouth daily before breakfast.  . mirtazapine (REMERON) 15 MG tablet Take 15 mg by mouth at bedtime. For appetite  . morphine (ROXANOL) 20 MG/ML concentrated solution Take 0.25 mLs (5 mg total) by mouth every 4 (four) hours as needed for severe pain.  . Multiple Vitamin (MULTIVITAMIN WITH MINERALS) TABS tablet Take 1 tablet by mouth daily.  Marland Kitchen omeprazole (PRILOSEC) 40 MG capsule Take 1 capsule (40 mg total) by mouth daily.  . ondansetron (ZOFRAN) 4 MG tablet Take 4 mg by mouth every 6 (six) hours as needed for nausea or vomiting.  . OXYGEN Inhale 2 L into the lungs as needed. To maintain O2 saturation of 90%  ( Shortness of Breath)  . phenylephrine-shark liver oil-mineral oil-petrolatum (PREPARATION H) 0.25-14-74.9 % rectal ointment Place 1 application rectally 4 (four) times daily as needed for hemorrhoids.  . polyethylene glycol (MIRALAX / GLYCOLAX) 17 g packet Take 17 g by mouth  daily.  . potassium chloride SA (KLOR-CON) 20 MEQ tablet Take 40 mEq by mouth daily. For 3 days  . senna-docusate (SENOKOT-S) 8.6-50 MG tablet Take 1 tablet by mouth 2 (two) times daily.  . sodium chloride 0.9 % SOLN Give one liters NS @ 100 ml/hr  . Zinc 50 MG CAPS Take 50 mg by mouth daily.   No facility-administered encounter medications on file as of 05/04/2020.    Review of Systems  Unable to perform ROS: Patient nonverbal    Immunization History  Administered Date(s) Administered  . DT (Pediatric) 02/04/1978, 02/05/1988  . Influenza Split 11/03/2018  . Influenza Whole 12/09/2006  . Influenza, High Dose Seasonal PF 01/24/2017, 01/08/2018  . Influenza, Seasonal, Injecte, Preservative Fre 11/07/2009, 11/30/2010, 11/25/2011  . Influenza,inj,Quad PF,6+ Mos 12/22/2012, 01/20/2014, 11/19/2019  . Influenza-Unspecified 11/13/2015  . PFIZER(Purple Top)SARS-COV-2 Vaccination 03/31/2019, 04/28/2019, 11/15/2019  . Pneumococcal Conjugate-13 06/24/2013  . Pneumococcal Polysaccharide-23 08/08/2017, 10/27/2018  . Pneumococcal-Unspecified 11/25/2000   Pertinent  Health Maintenance Due  Topic Date Due  . FOOT EXAM  Never done  . OPHTHALMOLOGY EXAM  Never done  . URINE MICROALBUMIN  Never done  . DEXA SCAN  Never done  . HEMOGLOBIN A1C  08/29/2020  . INFLUENZA VACCINE  Completed  . PNA vac Low Risk Adult  Completed   No flowsheet data found. Functional Status Survey:    Vitals:   05/04/20 1406  BP: (!) 112/57  Pulse: 87  Resp: 18  Temp: (!) 97.1 F (36.2 C)  SpO2: 94%  Weight: 118 lb (53.5 kg)  Height: 5' 4"  (1.626 m)   Body mass index is 20.25 kg/m. Physical Exam Vitals reviewed.  Constitutional:      Appearance: She is ill-appearing.  Cardiovascular:     Rate and Rhythm: Normal rate and regular rhythm.     Pulses: Normal pulses.     Heart sounds: Normal heart sounds. No murmur heard.   Pulmonary:     Effort: Pulmonary effort is normal. No respiratory distress.      Breath sounds: Normal breath sounds. No wheezing.  Abdominal:     General: Bowel sounds are normal.     Palpations: Abdomen is soft.     Tenderness: There is abdominal tenderness. There is guarding.     Comments: LLQ pain  Musculoskeletal:     Right lower leg: No edema.     Left lower leg: No edema.  Skin:    General: Skin is warm and dry.     Capillary Refill: Capillary refill takes less than 2 seconds.  Neurological:     General: No focal deficit present.     Mental Status: She is easily aroused. Mental status is at baseline.     Motor: Weakness present.     Gait: Gait abnormal.  Psychiatric:        Mood and Affect: Mood normal.        Behavior: Behavior normal.     Labs reviewed: Recent Labs    03/08/20 0542 03/09/20 0454 03/10/20 0449  NA 138 137 139  K 4.3 3.9 4.9  CL 104 104 101  CO2 24 24 27   GLUCOSE 259* 220* 149*  BUN 19 20 16   CREATININE 0.64 0.72 0.71  CALCIUM 8.3* 8.3* 8.7*  MG  --  1.7 2.1   Recent Labs    03/06/20 0610 03/07/20 0516 03/08/20 0542  AST 13* 16 26  ALT 11 11 11   ALKPHOS 42 45 59  BILITOT 0.4 0.4 0.5  PROT 5.3* 5.5* 5.4*  ALBUMIN 2.3* 2.3* 2.3*   Recent Labs    03/07/20 0516 03/08/20 0542 03/09/20 0454  WBC 12.6* 10.9* 11.1*  NEUTROABS 9.4* 8.3* 7.2  HGB 10.1* 9.9* 9.9*  HCT 31.0* 30.3* 30.5*  MCV 101.6* 101.7* 100.7*  PLT 499* 500* 454*   Lab Results  Component Value Date   TSH 10.50 (A) 04/15/2020   Lab Results  Component Value Date   HGBA1C 6.3 (H) 03/01/2020   No results found for: CHOL, HDL, LDLCALC, LDLDIRECT, TRIG, CHOLHDL  Significant Diagnostic Results in last 30 days:  CT Abdomen Pelvis W Contrast  Result Date: 04/27/2020 CLINICAL DATA:  87 year old female with abdominal pain. EXAM: CT ABDOMEN AND PELVIS WITH CONTRAST TECHNIQUE: Multidetector CT imaging of the abdomen and pelvis was performed using the standard protocol following bolus administration of intravenous contrast. CONTRAST:  135m ISOVUE-300  IOPAMIDOL (ISOVUE-300) INJECTION 61% COMPARISON:  CT abdomen pelvis dated 02/28/2020. FINDINGS: Lower chest: Emphysematous changes and  chronic interstitial coarsening of the visualized lung bases. There is coronary vascular calcification. No intra-abdominal free air or free fluid. Hepatobiliary: Subcentimeter right hepatic hypodense focus is too small to characterize. Probable mild fatty liver. No intrahepatic biliary ductal dilatation. The gallbladder is unremarkable. Pancreas: The pancreas is atrophic.  No active inflammatory changes. Spleen: Normal in size without focal abnormality. Adrenals/Urinary Tract: The adrenal glands unremarkable. There is no hydronephrosis on either side. There is symmetric enhancement and excretion of contrast by both kidneys. The visualized ureters appear unremarkable. The urinary bladder is decompressed around a Foley catheter. Stomach/Bowel: There is sigmoid diverticulosis with muscular hypertrophy. There is a complex collection in the posterior pelvis containing air and fluid which appear contiguous with the sigmoid colon most consistent with diverticular abscess. This collection measures approximately 5.5 x 7.0 cm in greatest axial dimensions (68/2). Overall there has been interval increase in the size of this collection compared to the prior CT. This collection abuts the posterior uterus and extends posteriorly to the presacral space. There is no bowel obstruction.  Appendectomy. Vascular/Lymphatic: Advanced aortoiliac atherosclerotic disease. The IVC is unremarkable. No portal venous gas. There is no adenopathy. Reproductive: The uterus is grossly unremarkable. Other: None Musculoskeletal: Osteopenia with degenerative changes of the spine. No acute osseous pathology. IMPRESSION: 1. Interval increase in the size of the pelvic diverticular abscess. 2. Aortic Atherosclerosis (ICD10-I70.0). Electronically Signed   By: Anner Crete M.D.   On: 04/27/2020 01:25     Assessment/Plan 1. Diarrhea, unspecified type - suspect antibiotics causing diarrhea - suggest adding probiotic po daily - currently on Flagyl for c-diff coverage - stool sample to r/o cdiff  2. Intra-abdominal abscess ([place]) - followed by palliative/hospice referral made 03/29 - taking po omnicef and flagyl - recent WBC 12 - IR does not recommend drain placement at this time - remains poor surgical candidate - do not suggest further antibiotic treatment, but recommend ID consult if we proceed further - advise daughter and son contact hospice to discuss future goals of care    Family/ staff Communication: plan discussed with daughter and facility nurse  Labs/tests ordered:  Stool sample to r/o c-diff

## 2020-05-04 NOTE — Progress Notes (Signed)
Location:    [facility] Room Number: 163/W Place of Service:  SNF [phone]) Provider: Windell Moulding NP   [name], PA-C  Patient Care Team: [name], PA-C as PCP - General (Family Medicine) System, Provider Not In  Extended Emergency Contact Information Primary Emergency Contact: [name],[name] Address: [place], [place] [address] [name] of [place] Phone: [phone] Mobile Phone: [phone] Relation: Daughter Secondary Emergency Contact: [name] of [place] Mobile Phone: [phone] Relation: Son  Code Status: Full Code  Goals of care: Advanced Directive information Advanced Directives 05/04/2020  Does Patient Have a Medical Advance Directive? Yes  Type of Advance Directive Living will  Does patient want to make changes to medical advance directive? No - Patient declined  Copy of [place] in Chart? -  Would patient like information on creating a medical advance directive? -  Pre-existing out of facility DNR order (yellow form or pink MOST form) Pink MOST form placed in chart (order not valid for inpatient use)     Chief Complaint  Patient presents with  . Acute Visit    Diarrhea     HPI:  Pt is a 87 y.o. female seen today for an acute visit for    Past Medical History:  Diagnosis Date  . Arthritis   . Cataract   . Chronic cough   . COPD (chronic obstructive pulmonary disease) ([place])   . Diabetes mellitus without complication ([place])   . Diverticulitis   . Diverticulosis   . GERD (gastroesophageal reflux disease)   . Hepatic steatosis   . Hiatal hernia   . Hypothyroidism   . ILD (interstitial lung disease) ([place])   . Pulmonary fibrosis ([place])   . Thyroid disease   . Vertigo    Past Surgical History:  Procedure Laterality Date  . APPENDECTOMY    . INTRAOCULAR LENS INSERTION  1997  . REPLACEMENT TOTAL KNEE BILATERAL    . SHOULDER SURGERY Right     tendonitis    Allergies  Allergen Reactions  . Oxycodone-Acetaminophen Nausea Only  . Penicillins Hives and Rash    Has patient had a PCN reaction causing immediate rash, facial/tongue/throat swelling, SOB or lightheadedness with hypotension: no Has patient had a PCN reaction causing severe rash involving mucus membranes or skin necrosis: unknown Has patient had a PCN reaction that required hospitalization : unknown Has patient had a PCN reaction occurring within the last 10 years: no If all of the above answers are "NO", then may proceed with Cephalosporin use.   . Codeine     Nausea and vomiting  . Percocet [Oxycodone-Acetaminophen] Nausea And Vomiting  . Tramadol Nausea And Vomiting    Allergies as of 05/04/2020      Reactions   Oxycodone-acetaminophen Nausea Only   Penicillins Hives, Rash   Has patient had a PCN reaction causing immediate rash, facial/tongue/throat swelling, SOB or lightheadedness with hypotension: no Has patient had a PCN reaction causing severe rash involving mucus membranes or skin necrosis: unknown Has patient had a PCN reaction that required hospitalization : unknown Has patient had a PCN reaction occurring within the last 10 years: no If all of the above answers are "NO", then may proceed with Cephalosporin use.   Codeine    Nausea and vomiting   Percocet [oxycodone-acetaminophen] Nausea And Vomiting   Tramadol Nausea And Vomiting      Medication List  Accurate as of May 04, 2020  2:53 PM. If you have any questions, ask your nurse or doctor.        STOP taking these medications   potassium chloride SA 20 MEQ tablet Commonly known as: KLOR-CON Stopped by: Yvonna Alanis, NP   sodium chloride 0.9 % Soln Stopped by: Yvonna Alanis, NP     TAKE these medications   acetaminophen 500 MG tablet Commonly known as: TYLENOL Take 1,000 mg by mouth in the morning and at bedtime.   albuterol 108 (90 Base) MCG/ACT inhaler Commonly known as:  VENTOLIN HFA Inhale 2 puffs into the lungs every 6 (six) hours as needed for wheezing or shortness of breath.   ammonium lactate 12 % lotion Commonly known as: LAC-HYDRIN Apply 1 application topically daily as needed for dry skin.   cefdinir 300 MG capsule Commonly known as: OMNICEF Take 300 mg by mouth 2 (two) times daily. For 14 days   CLEAR EYES REDNESS RELIEF OP Place 2 drops into the right eye 4 (four) times daily as needed. For Eye Irritation   diclofenac 1.3 % Ptch Commonly known as: FLECTOR Place 1 patch onto the skin 2 (two) times daily. Right wrist and neck twice daily for pain   diphenoxylate-atropine 2.5-0.025 MG tablet Commonly known as: LOMOTIL Take 2 tablets by mouth 4 (four) times daily as needed for diarrhea or loose stools. diarrhea   feeding supplement Liqd Commonly known as: BOOST / RESOURCE BREEZE Take 1 Container by mouth 3 (three) times daily. To promote wound healing   glycopyrrolate 2 MG tablet Commonly known as: ROBINUL Take 1 tablet (2 mg total) by mouth in the morning.   levothyroxine 88 MCG tablet Commonly known as: SYNTHROID Take 1 tablet (88 mcg total) by mouth daily before breakfast.   metFORMIN 500 MG tablet Commonly known as: GLUCOPHAGE Take 1 tablet by mouth daily.   metroNIDAZOLE 500 MG tablet Commonly known as: FLAGYL Take 500 mg by mouth 3 (three) times daily. For 14 days   mirtazapine 15 MG tablet Commonly known as: REMERON Take 15 mg by mouth at bedtime. For appetite   morphine 20 MG/ML concentrated solution Commonly known as: ROXANOL Take 0.25 mLs (5 mg total) by mouth every 4 (four) hours as needed for severe pain. Started by: Yvonna Alanis, NP   multivitamin with minerals Tabs tablet Take 1 tablet by mouth daily.   omeprazole 40 MG capsule Commonly known as: PRILOSEC Take 1 capsule (40 mg total) by mouth daily.   ondansetron 4 MG tablet Commonly known as: ZOFRAN Take 4 mg by mouth every 6 (six) hours as needed for  nausea or vomiting.   OXYGEN Inhale 2 L into the lungs as needed. To maintain O2 saturation of 90%  ( Shortness of Breath)   polyethylene glycol 17 g packet Commonly known as: MIRALAX / GLYCOLAX Take 17 g by mouth daily.   Preparation H 0.25-14-74.9 % rectal ointment Generic drug: phenylephrine-shark liver oil-mineral oil-petrolatum Place 1 application rectally 4 (four) times daily as needed for hemorrhoids.   saccharomyces boulardii 250 MG capsule Commonly known as: FLORASTOR Take 250 mg by mouth daily in the afternoon.   senna-docusate 8.6-50 MG tablet Commonly known as: Senokot-S Take 1 tablet by mouth 2 (two) times daily.   vitamin C 1000 MG tablet Take 1,000 mg by mouth daily.   Vitamin D-3 25 MCG (1000 UT) Caps Take 2,000 Units by mouth daily.   Zinc 50 MG Caps Take 50  mg by mouth daily.       Review of Systems  Immunization History  Administered Date(s) Administered  . DT (Pediatric) 02/04/1978, 02/05/1988  . Influenza Split 11/03/2018  . Influenza Whole 12/09/2006  . Influenza, High Dose Seasonal PF 01/24/2017, 01/08/2018  . Influenza, Seasonal, Injecte, Preservative Fre 11/07/2009, 11/30/2010, 11/25/2011  . Influenza,inj,Quad PF,6+ Mos 12/22/2012, 01/20/2014, 11/19/2019  . Influenza-Unspecified 11/13/2015  . PFIZER(Purple Top)SARS-COV-2 Vaccination 03/31/2019, 04/28/2019, 11/15/2019  . Pneumococcal Conjugate-13 06/24/2013  . Pneumococcal Polysaccharide-23 08/08/2017, 10/27/2018  . Pneumococcal-Unspecified 11/25/2000   Pertinent  Health Maintenance Due  Topic Date Due  . FOOT EXAM  Never done  . OPHTHALMOLOGY EXAM  Never done  . URINE MICROALBUMIN  Never done  . DEXA SCAN  Never done  . HEMOGLOBIN A1C  08/29/2020  . INFLUENZA VACCINE  Completed  . PNA vac Low Risk Adult  Completed   No flowsheet data found. Functional Status Survey:    Vitals:   05/04/20 1406  BP: (!) 112/57  Pulse: 87  Resp: 18  Temp: (!) 97.1 F (36.2 C)  SpO2: 94%   Weight: 118 lb (53.5 kg)  Height: 5' 4"  (1.626 m)   Body mass index is 20.25 kg/m. Physical Exam  Labs reviewed: Recent Labs    03/08/20 0542 03/09/20 0454 03/10/20 0449  NA 138 137 139  K 4.3 3.9 4.9  CL 104 104 101  CO2 24 24 27   GLUCOSE 259* 220* 149*  BUN 19 20 16   CREATININE 0.64 0.72 0.71  CALCIUM 8.3* 8.3* 8.7*  MG  --  1.7 2.1   Recent Labs    03/06/20 0610 03/07/20 0516 03/08/20 0542  AST 13* 16 26  ALT 11 11 11   ALKPHOS 42 45 59  BILITOT 0.4 0.4 0.5  PROT 5.3* 5.5* 5.4*  ALBUMIN 2.3* 2.3* 2.3*   Recent Labs    03/07/20 0516 03/08/20 0542 03/09/20 0454  WBC 12.6* 10.9* 11.1*  NEUTROABS 9.4* 8.3* 7.2  HGB 10.1* 9.9* 9.9*  HCT 31.0* 30.3* 30.5*  MCV 101.6* 101.7* 100.7*  PLT 499* 500* 454*   Lab Results  Component Value Date   TSH 10.50 (A) 04/15/2020   Lab Results  Component Value Date   HGBA1C 6.3 (H) 03/01/2020   No results found for: CHOL, HDL, LDLCALC, LDLDIRECT, TRIG, CHOLHDL  Significant Diagnostic Results in last 30 days:  CT Abdomen Pelvis W Contrast  Result Date: 04/27/2020 CLINICAL DATA:  87 year old female with abdominal pain. EXAM: CT ABDOMEN AND PELVIS WITH CONTRAST TECHNIQUE: Multidetector CT imaging of the abdomen and pelvis was performed using the standard protocol following bolus administration of intravenous contrast. CONTRAST:  166m ISOVUE-300 IOPAMIDOL (ISOVUE-300) INJECTION 61% COMPARISON:  CT abdomen pelvis dated 02/28/2020. FINDINGS: Lower chest: Emphysematous changes and chronic interstitial coarsening of the visualized lung bases. There is coronary vascular calcification. No intra-abdominal free air or free fluid. Hepatobiliary: Subcentimeter right hepatic hypodense focus is too small to characterize. Probable mild fatty liver. No intrahepatic biliary ductal dilatation. The gallbladder is unremarkable. Pancreas: The pancreas is atrophic.  No active inflammatory changes. Spleen: Normal in size without focal abnormality.  Adrenals/Urinary Tract: The adrenal glands unremarkable. There is no hydronephrosis on either side. There is symmetric enhancement and excretion of contrast by both kidneys. The visualized ureters appear unremarkable. The urinary bladder is decompressed around a Foley catheter. Stomach/Bowel: There is sigmoid diverticulosis with muscular hypertrophy. There is a complex collection in the posterior pelvis containing air and fluid which appear contiguous with the sigmoid colon  most consistent with diverticular abscess. This collection measures approximately 5.5 x 7.0 cm in greatest axial dimensions (68/2). Overall there has been interval increase in the size of this collection compared to the prior CT. This collection abuts the posterior uterus and extends posteriorly to the presacral space. There is no bowel obstruction.  Appendectomy. Vascular/Lymphatic: Advanced aortoiliac atherosclerotic disease. The IVC is unremarkable. No portal venous gas. There is no adenopathy. Reproductive: The uterus is grossly unremarkable. Other: None Musculoskeletal: Osteopenia with degenerative changes of the spine. No acute osseous pathology. IMPRESSION: 1. Interval increase in the size of the pelvic diverticular abscess. 2. Aortic Atherosclerosis (ICD10-I70.0). Electronically Signed   By: Anner Crete M.D.   On: 04/27/2020 01:25    Assessment/Plan 1. Diarrhea, unspecified type   2. Intra-abdominal abscess [facility])     Family/ staff Communication:   Labs/tests ordered:

## 2020-08-04 DEATH — deceased

## 2021-01-24 IMAGING — CT CT ABD-PELV W/ CM
2 of 5 series · 16 of 46 positions shown, 18 images · IV contrast (omnipaque)
Comparison: 03/31/2018

CLINICAL DATA: Diarrhea.  Blood in stool.  Low-grade fever.

EXAM:
CT ABDOMEN AND PELVIS WITH CONTRAST
TECHNIQUE: Multidetector CT imaging of the abdomen and pelvis was performed
using the standard protocol following bolus administration of
intravenous contrast.
CONTRAST:  75mL OMNIPAQUE IOHEXOL 300 MG/ML  SOLN

[Series 2: axial st · axial · 0.69mm/px · z∈[-497,-112]mm · 13 of 89 slices shown, 15 images]
[im 6/89  soft-tissue]
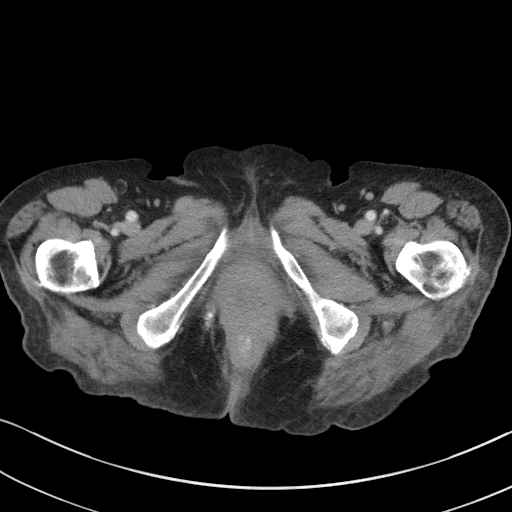
[im 6/89  bone]
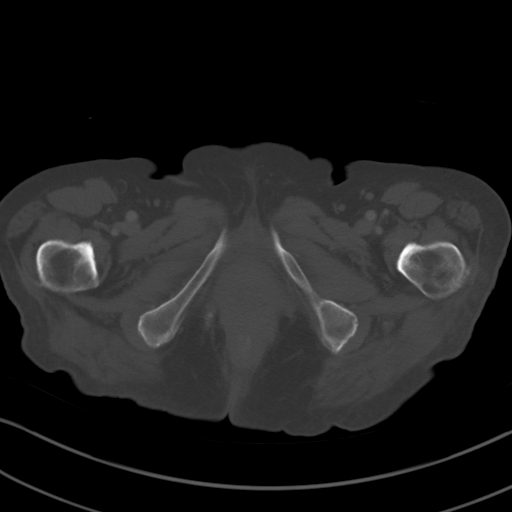
[im 12/89  soft-tissue]
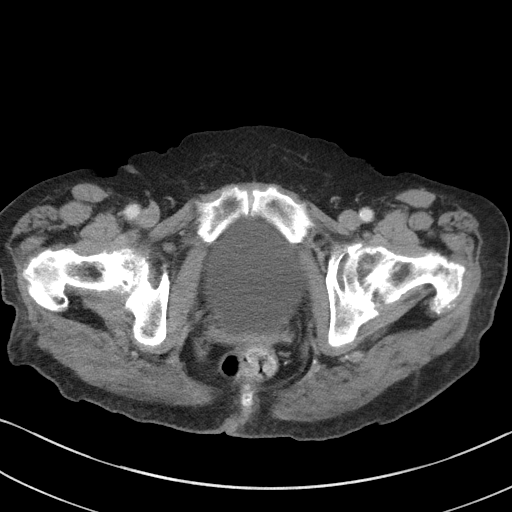
[im 17/89  soft-tissue]
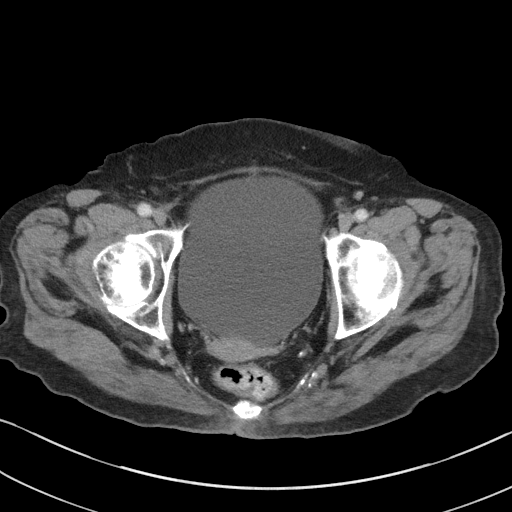
[im 28/89  soft-tissue]
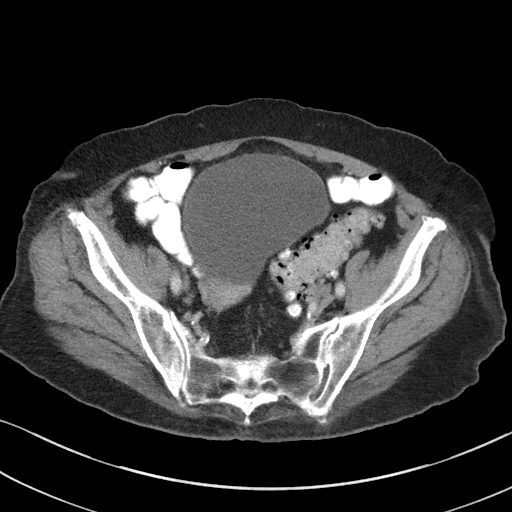
[im 34/89  soft-tissue]
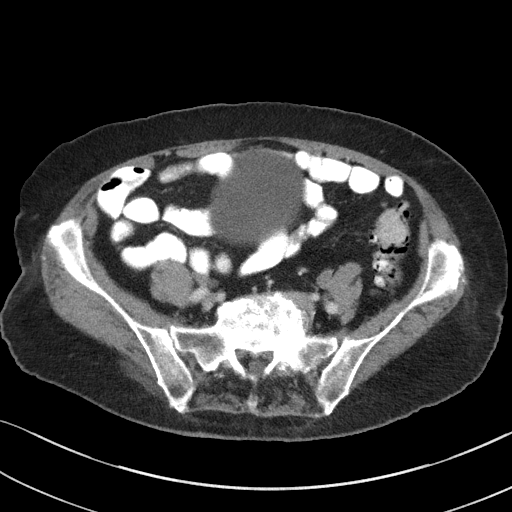
[im 39/89  soft-tissue]
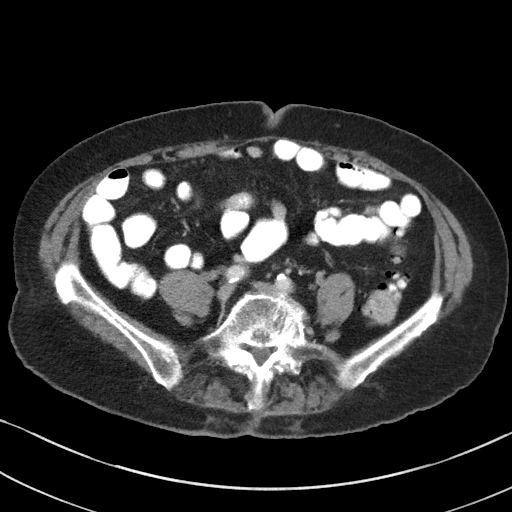
[im 45/89  soft-tissue]
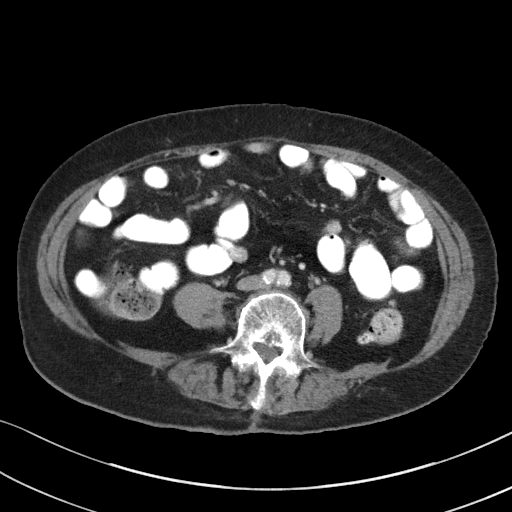
[im 50/89  soft-tissue]
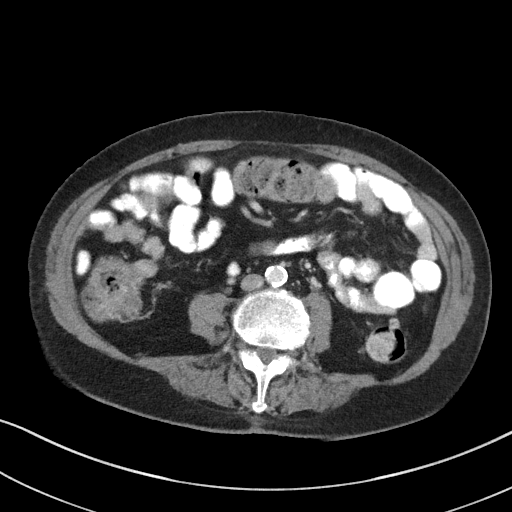
[im 56/89  soft-tissue]
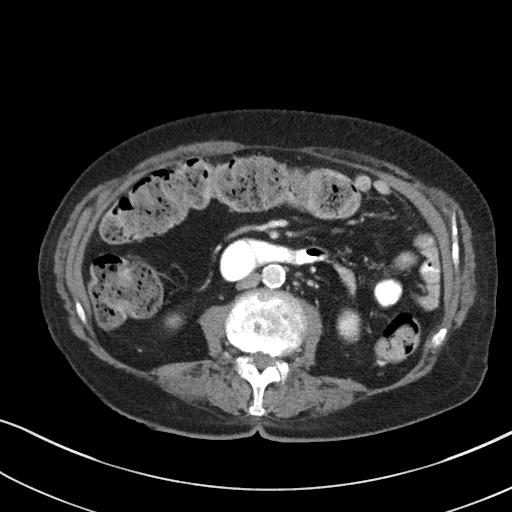
[im 56/89  bone]
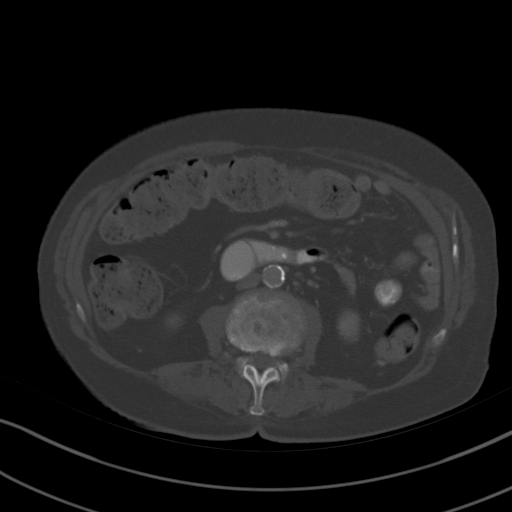
[im 61/89  soft-tissue]
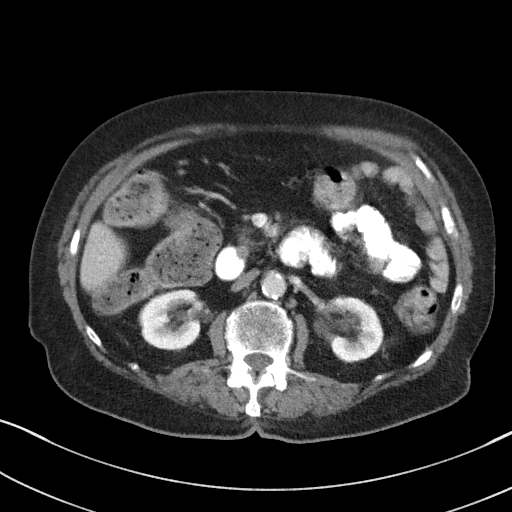
[im 72/89  soft-tissue]
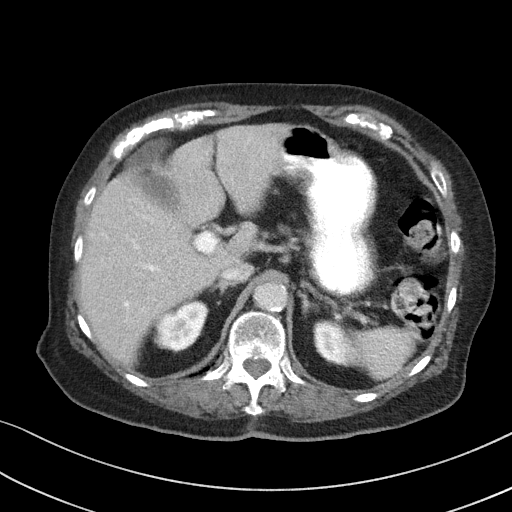
[im 78/89  soft-tissue]
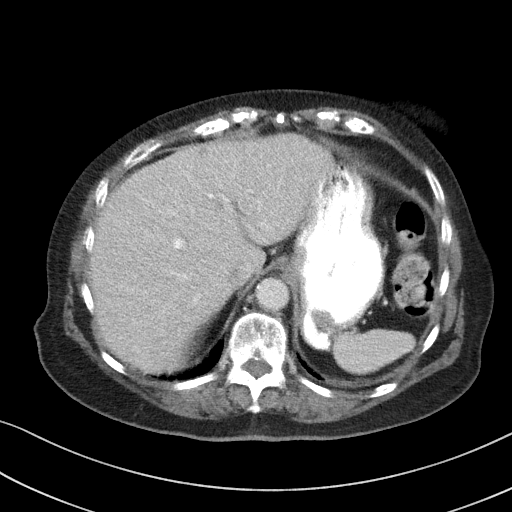
[im 83/89  soft-tissue]
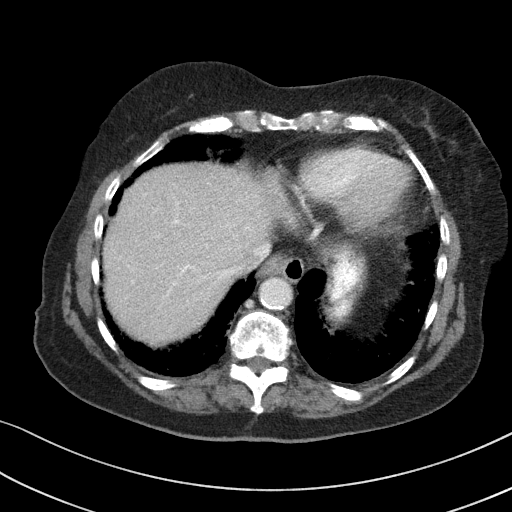

[Series 4: coronal st · coronal · 0.73mm/px · 3 of 81 slices shown]
[im 27/81  soft-tissue]
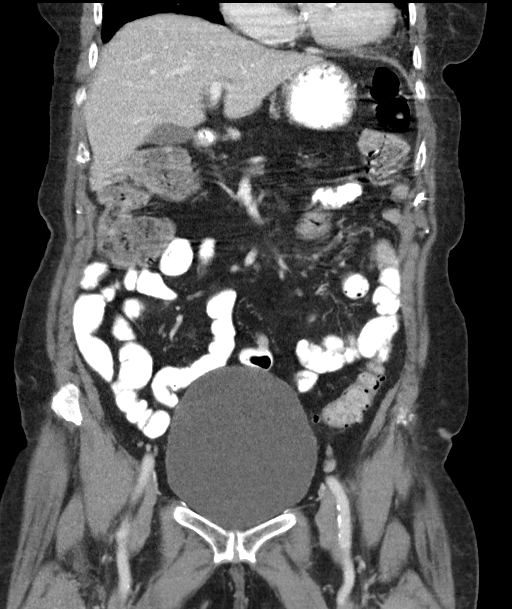
[im 36/81  soft-tissue]
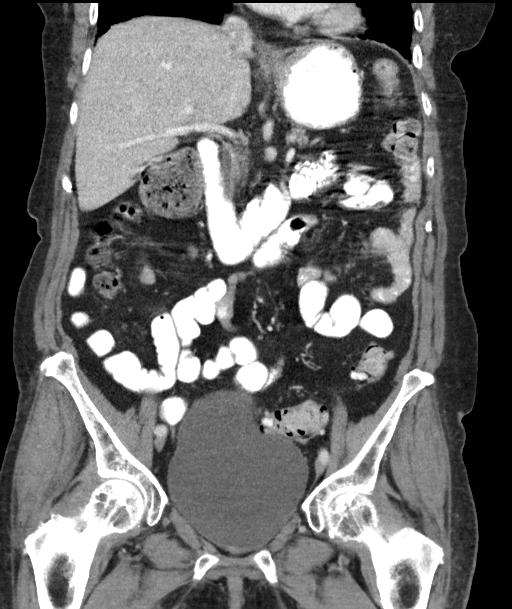
[im 45/81  soft-tissue]
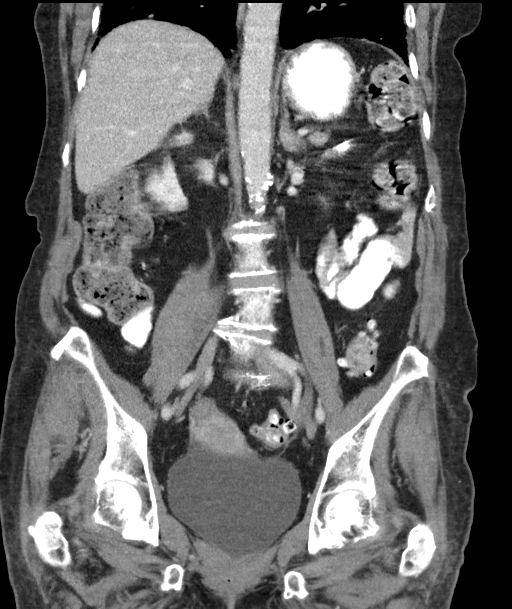

[16 of 46 positions shown; findings below may reference images not displayed]

FINDINGS: Lower chest: Chronic interstitial changes with subpleural
reticulation in the lung bases suggesting component of underlying
pulmonary fibrosis.

Hepatobiliary: No suspicious focal abnormality within the liver
parenchyma. Tiny hypodensity posterior right liver is stable, likely
benign. There is no evidence for gallstones, gallbladder wall
thickening, or pericholecystic fluid. No intrahepatic or
extrahepatic biliary dilation.

Pancreas: No focal mass lesion. No dilatation of the main duct. No
intraparenchymal cyst. No peripancreatic edema.

Spleen: No splenomegaly. No focal mass lesion.

Adrenals/Urinary Tract: No adrenal nodule or mass. Right kidney
unremarkable. 2 cm anterior cyst upper pole left kidney was 2.2 cm
previously. No suspicious enhancing mass lesion in the left kidney.
No evidence for hydroureter. Marked bladder distention evident.

Stomach/Bowel: Stomach is unremarkable. No gastric wall thickening.
No evidence of outlet obstruction. Duodenum is normally positioned
as is the ligament of Treitz. No small bowel wall thickening. No
small bowel dilatation. Diverticular changes are noted in the left
colon without evidence of diverticulitis.

Vascular/Lymphatic: There is abdominal aortic atherosclerosis
without aneurysm. There is no gastrohepatic or hepatoduodenal
ligament lymphadenopathy. No retroperitoneal or mesenteric
lymphadenopathy. No pelvic sidewall lymphadenopathy.

Reproductive: The uterus is unremarkable.  There is no adnexal mass.

Other: No intraperitoneal free fluid.

Musculoskeletal: Pelvic floor laxity evident. Bones are diffusely
demineralized. No worrisome lytic or sclerotic osseous abnormality.
IMPRESSION: 1. No acute findings in the abdomen or pelvis. Specifically, no
findings to explain the patient's history of diarrhea.
2. Marked bladder distention. Underlying bladder dysfunction or
outlet obstruction not excluded.
3. Left colonic diverticulosis without diverticulitis.
4. Aortic Atherosclerosis ([id number]).

## 2021-03-26 IMAGING — CT CT ABD-PELV W/ CM
2 of 5 series · 15 of 46 positions shown, 17 images · IV contrast (omnipaque)
Comparison: 12/29/2019

CLINICAL DATA: Acute abdominal pain. Difficulty urinating. Small
amount of blood in the rectum.

EXAM:
CT ABDOMEN AND PELVIS WITH CONTRAST
TECHNIQUE: Multidetector CT imaging of the abdomen and pelvis was performed
using the standard protocol following bolus administration of
intravenous contrast.
CONTRAST:  100mL OMNIPAQUE IOHEXOL 300 MG/ML  SOLN

[Series 2: axial st · axial · 0.85mm/px · z∈[+893,+1338]mm · 12 of 101 slices shown, 14 images]
[im 6/101  soft-tissue]
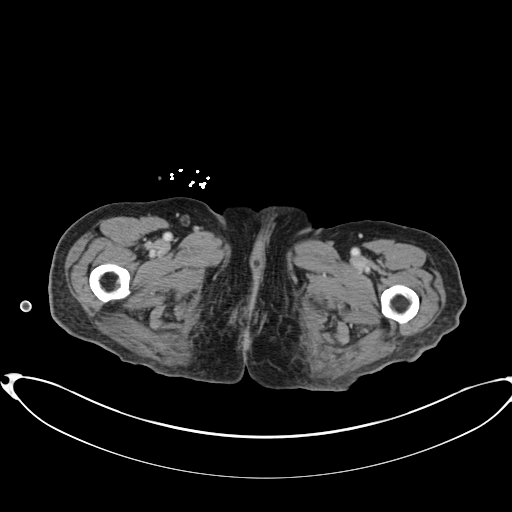
[im 6/101  bone]
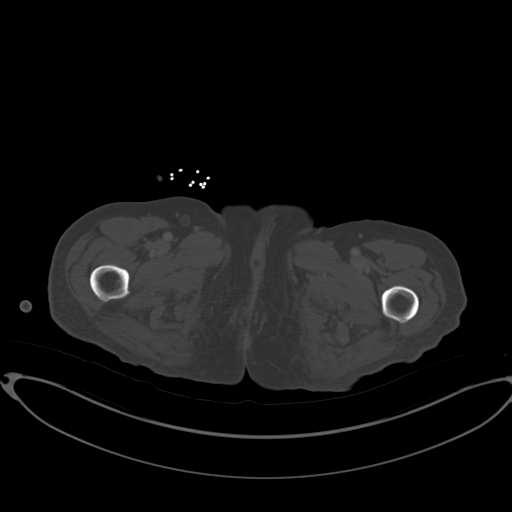
[im 16/101  soft-tissue]
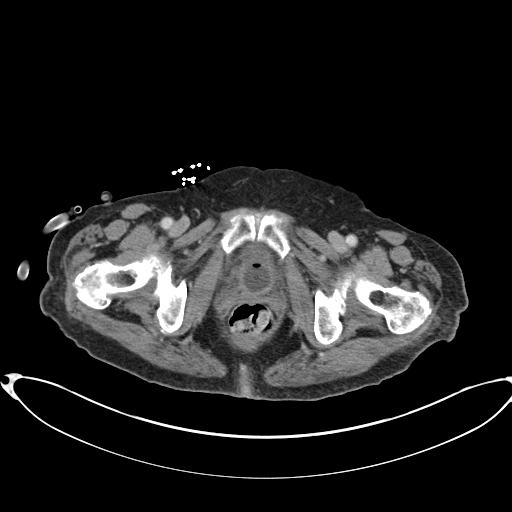
[im 22/101  soft-tissue]
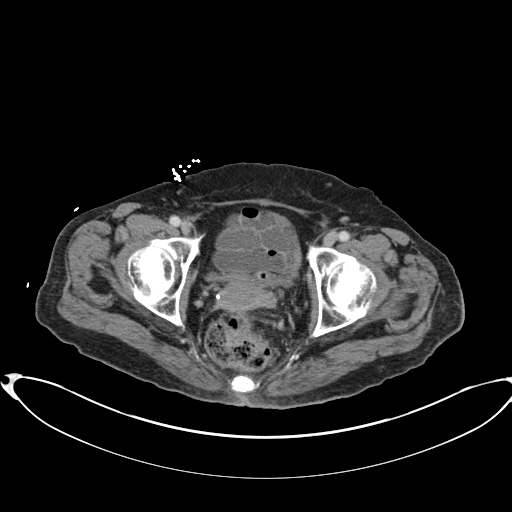
[im 32/101  soft-tissue]
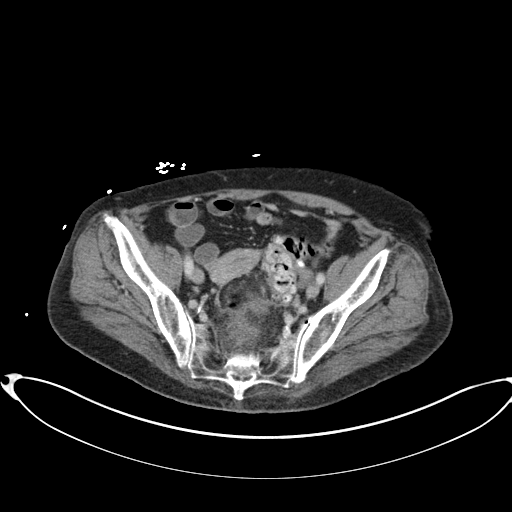
[im 37/101  soft-tissue]
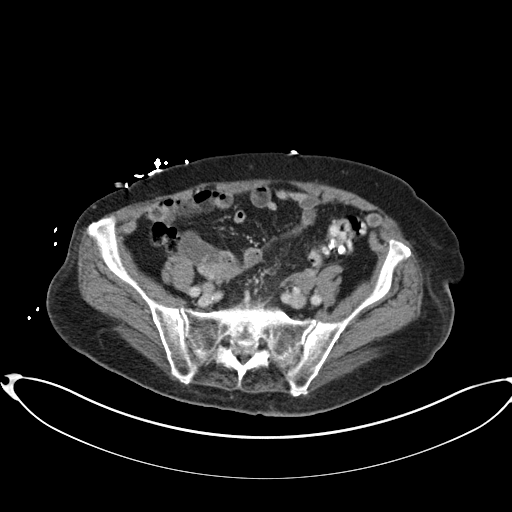
[im 48/101  soft-tissue]
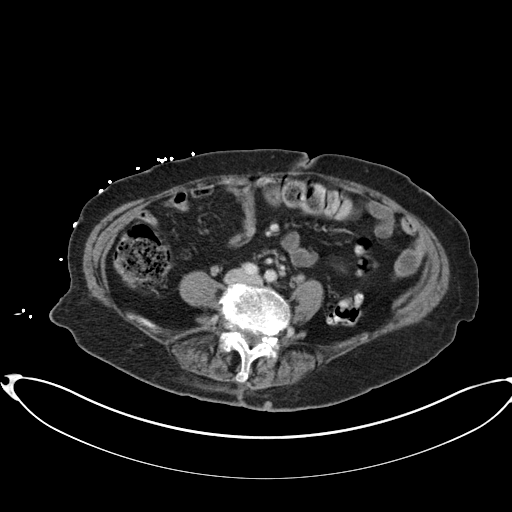
[im 53/101  soft-tissue]
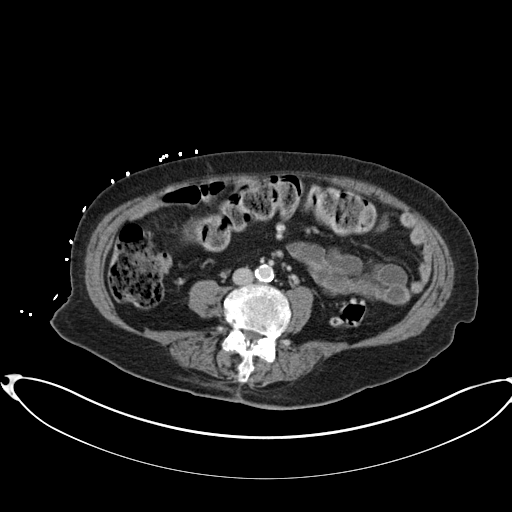
[im 64/101  soft-tissue]
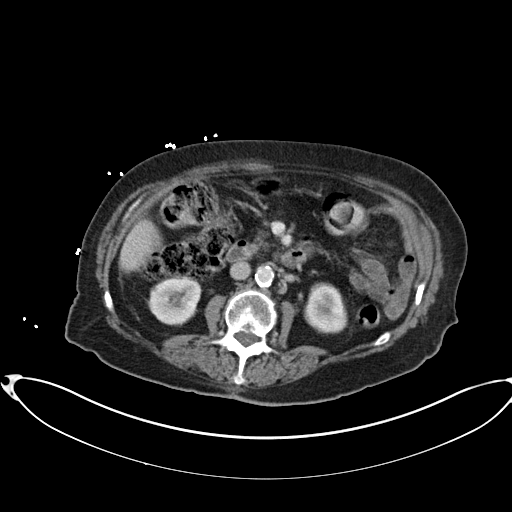
[im 69/101  soft-tissue]
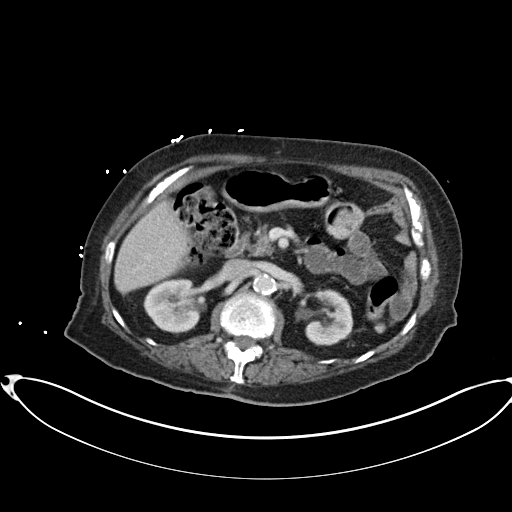
[im 69/101  bone]
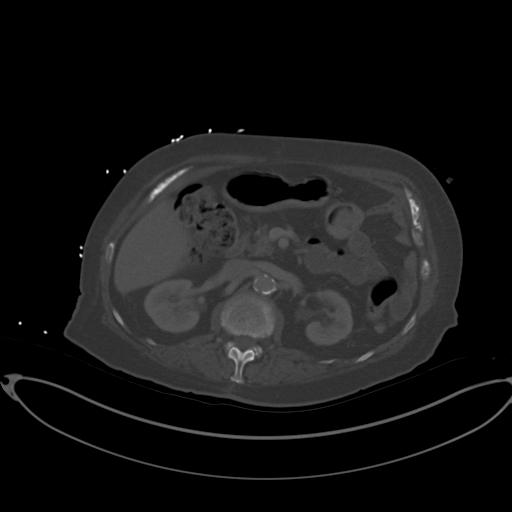
[im 79/101  soft-tissue]
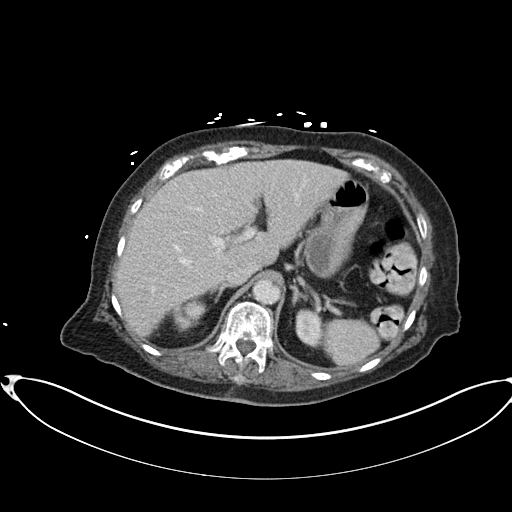
[im 85/101  soft-tissue]
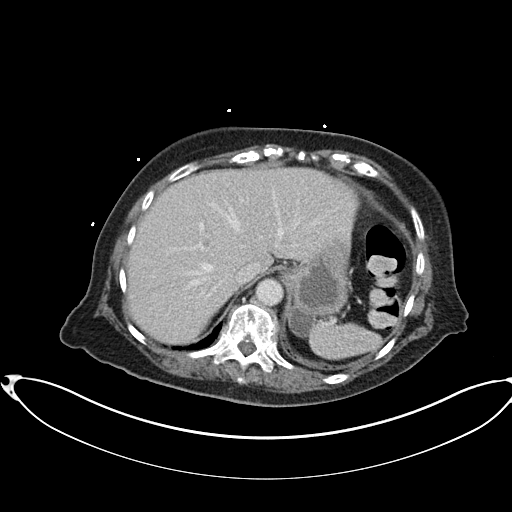
[im 95/101  soft-tissue]
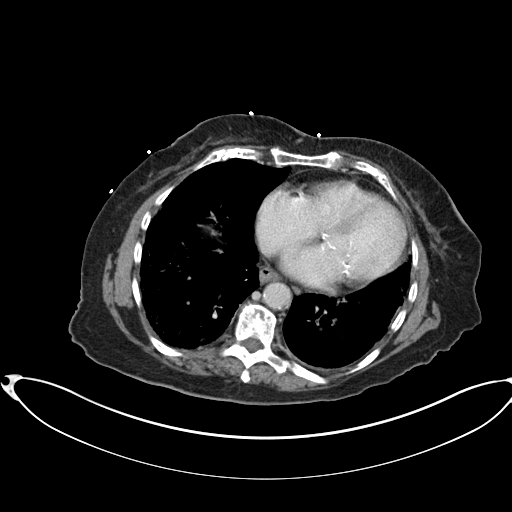

[Series 5: coronal st · coronal · 0.77mm/px · 3 of 55 slices shown]
[im 19/55  soft-tissue]
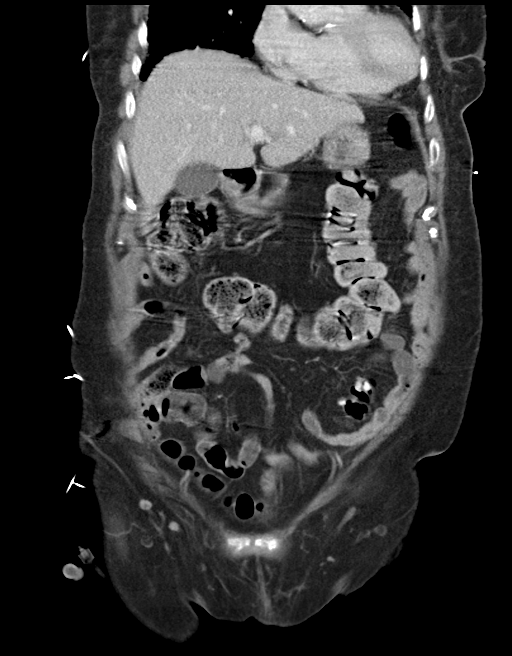
[im 25/55  soft-tissue]
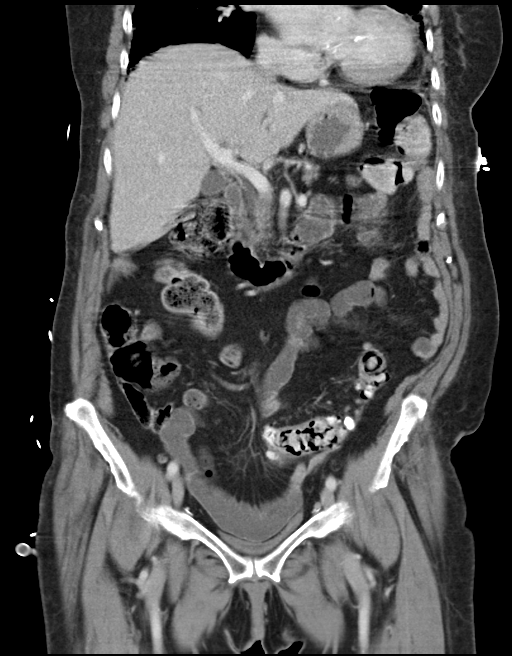
[im 31/55  soft-tissue]
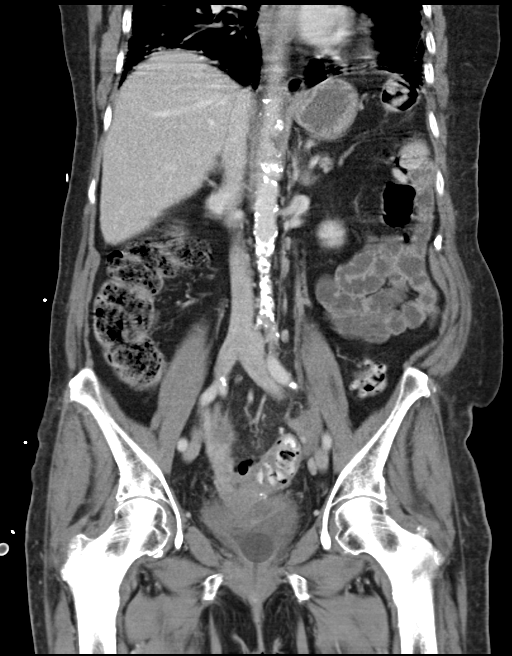

[15 of 46 positions shown; findings below may reference images not displayed]

FINDINGS: Lower chest: Mild cardiac enlargement. Coronary artery
calcifications. Fibrosis and honeycomb changes in the lung bases.
Small esophageal hiatal hernia.

Hepatobiliary: No focal liver abnormality is seen. No gallstones,
gallbladder wall thickening, or biliary dilatation.

Pancreas: Unremarkable. No pancreatic ductal dilatation or
surrounding inflammatory changes.

Spleen: Normal in size without focal abnormality.

Adrenals/Urinary Tract: Adrenal glands are unremarkable. Kidneys are
normal, without renal calculi, focal lesion, or hydronephrosis.
Bladder is decompressed with a Foley catheter.

Stomach/Bowel: Diverticulum off of the cardiac region of the
stomach. Stomach is decompressed without wall thickening. Small
bowel are mostly decompressed with scattered fluid. No wall
thickening. Scattered stool throughout the colon without colonic
distention. Diverticulosis of the sigmoid colon. The rectosigmoid
junction demonstrates wall thickening and edema with adjacent
stranding. 3.1 cm diameter cystic collection suggested along the
lateral wall of the rectosigmoid junction. The appearance suggests
focal acute diverticulitis with an abscess. An underlying mass
lesion could also have this appearance and should be excluded in
follow-up.

Vascular/Lymphatic: Aortic atherosclerosis. No enlarged abdominal or
pelvic lymph nodes.

Reproductive: Uterus and bilateral adnexa are unremarkable.

Other: No abdominal wall hernia or abnormality. No abdominopelvic
ascites.

Musculoskeletal: Degenerative changes in the spine and hips.
IMPRESSION: 1. Diverticulosis of the sigmoid colon with wall thickening, edema
and adjacent stranding. 3.1 cm diameter cystic collection suggested
along the lateral wall of the rectosigmoid junction. The appearance
suggests focal acute diverticulitis with an abscess. An underlying
mass lesion could also have this appearance and should be excluded
in follow-up.
2. Small esophageal hiatal hernia.
3. Fibrosis and honeycomb changes in the lung bases.
4. Aortic atherosclerosis.
5. Foley catheter in the bladder.
6. Diverticulum off of the cardiac region of the stomach.
7. Degenerative changes in the spine and hips.

Aortic Atherosclerosis ([id number].K).

## 2021-03-31 IMAGING — DX DG KNEE 1-2V*R*
2 series · 2 of 2 positions shown · non-contrast
Comparison: Portable exam 7179 hours without priors for comparison

CLINICAL DATA: RIGHT knee pain post fall

EXAM:
RIGHT KNEE - 1-2 VIEW

[knee ap]
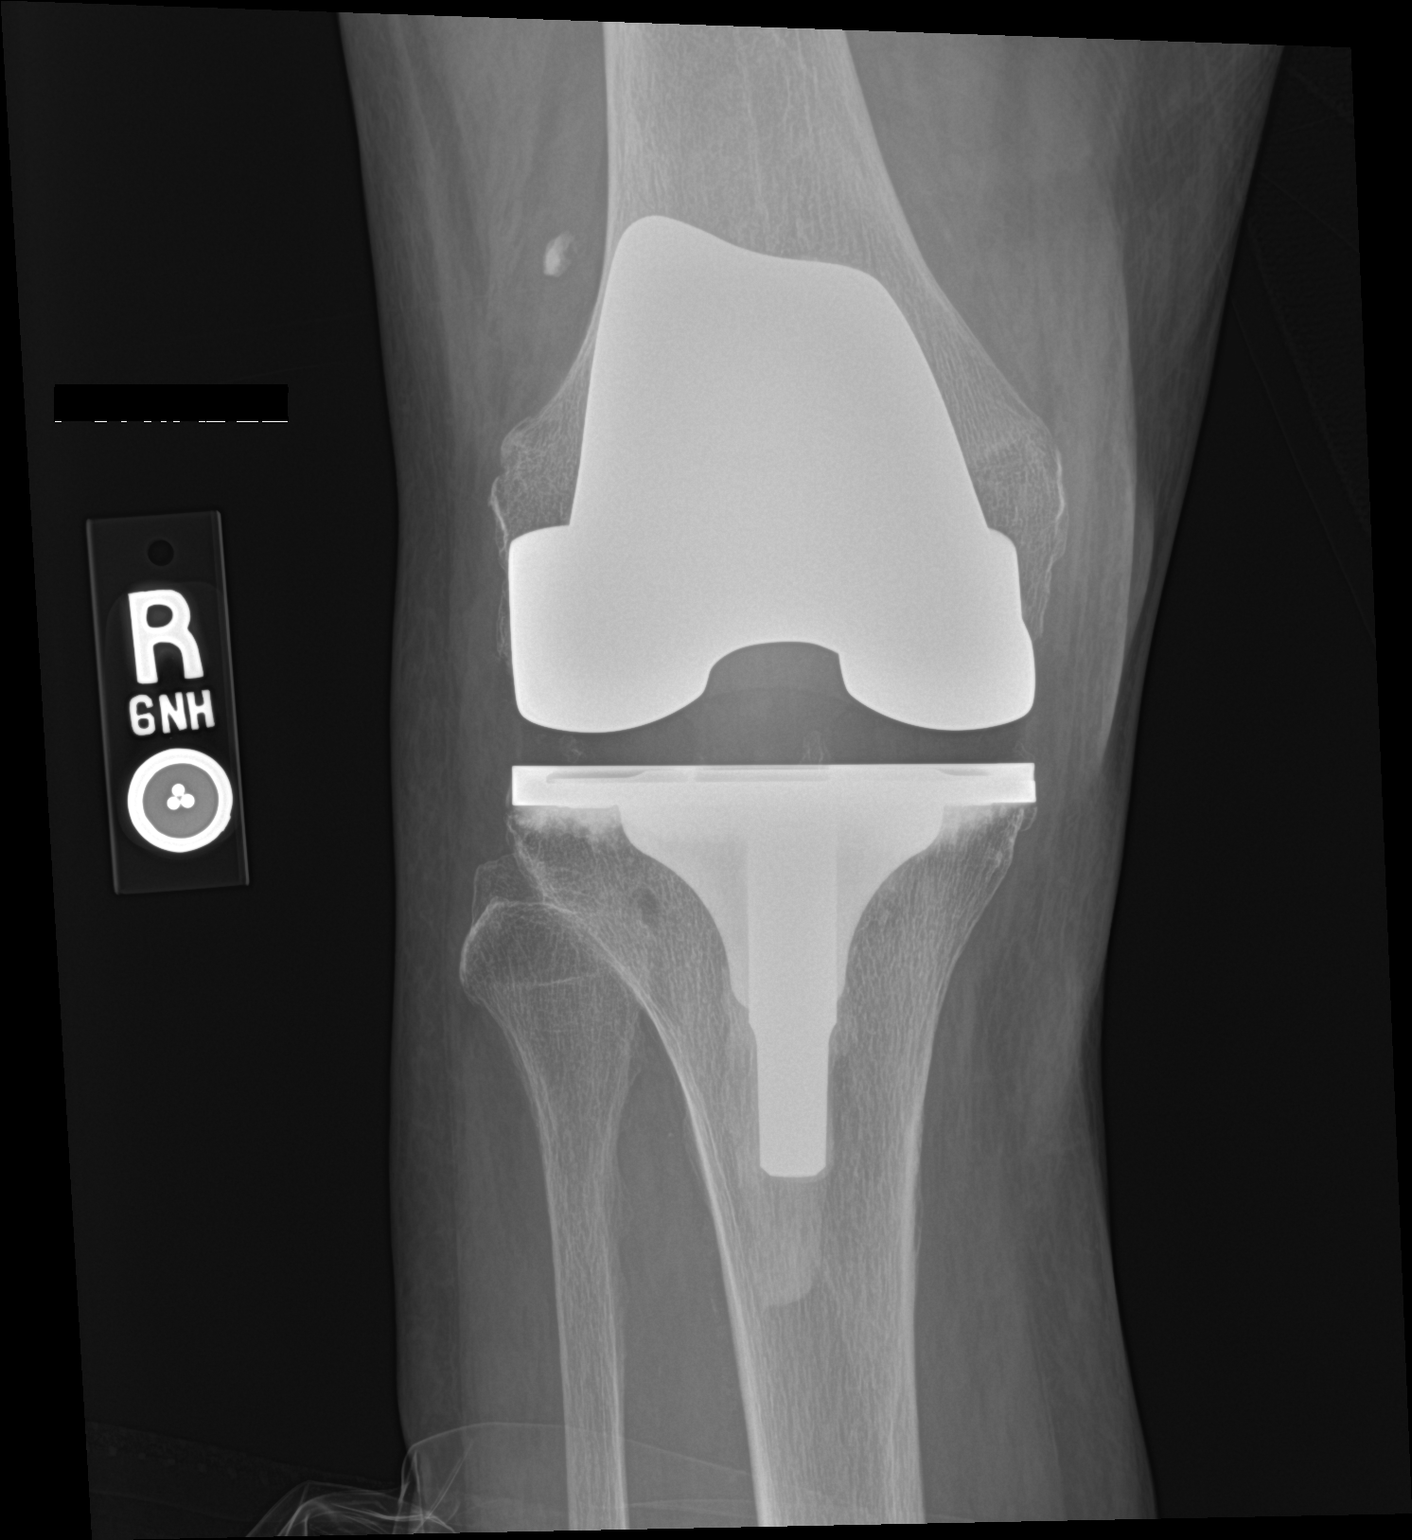

[knee lat]
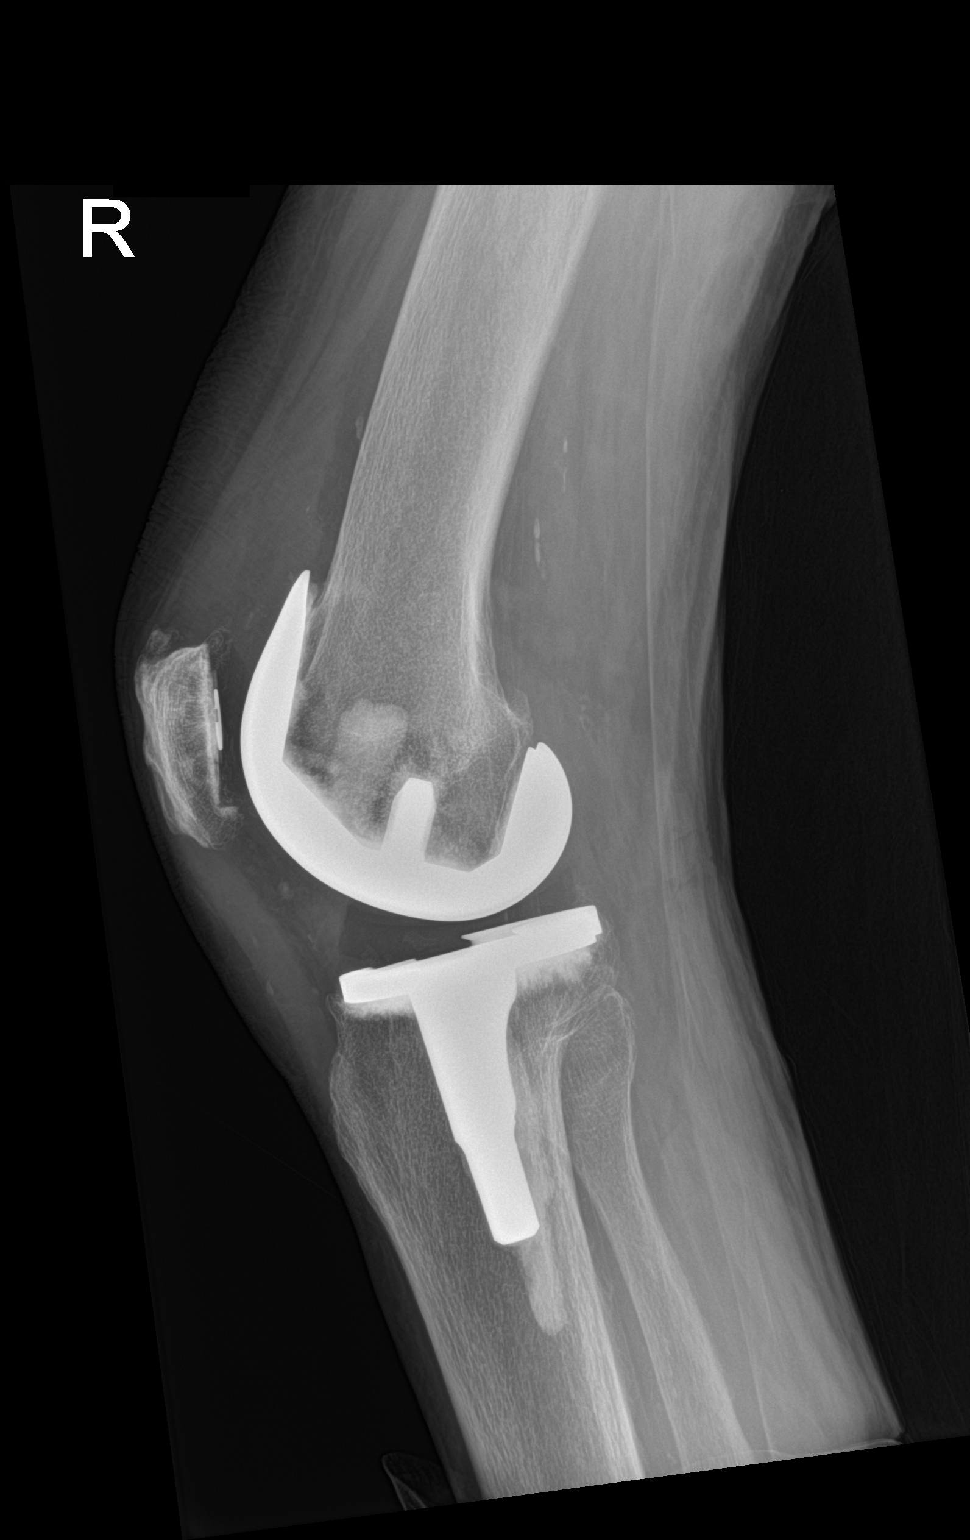

[2 of 2 positions shown; findings below may reference images not displayed]

FINDINGS: Osseous demineralization.

Components of a RIGHT knee prosthesis are identified.

No acute fracture, dislocation, bone destruction or periprosthetic
lucency.

Small knee joint effusion.
IMPRESSION: RIGHT knee prosthesis with small joint effusion.

No acute osseous abnormalities.

## 2021-04-02 IMAGING — DX DG CHEST 1V
1 series · 1 of 1 positions shown · non-contrast
Comparison: Chest x-ray 02/28/2020

CLINICAL DATA: Pneumonia

EXAM:
CHEST  1 VIEW

[chest ap]
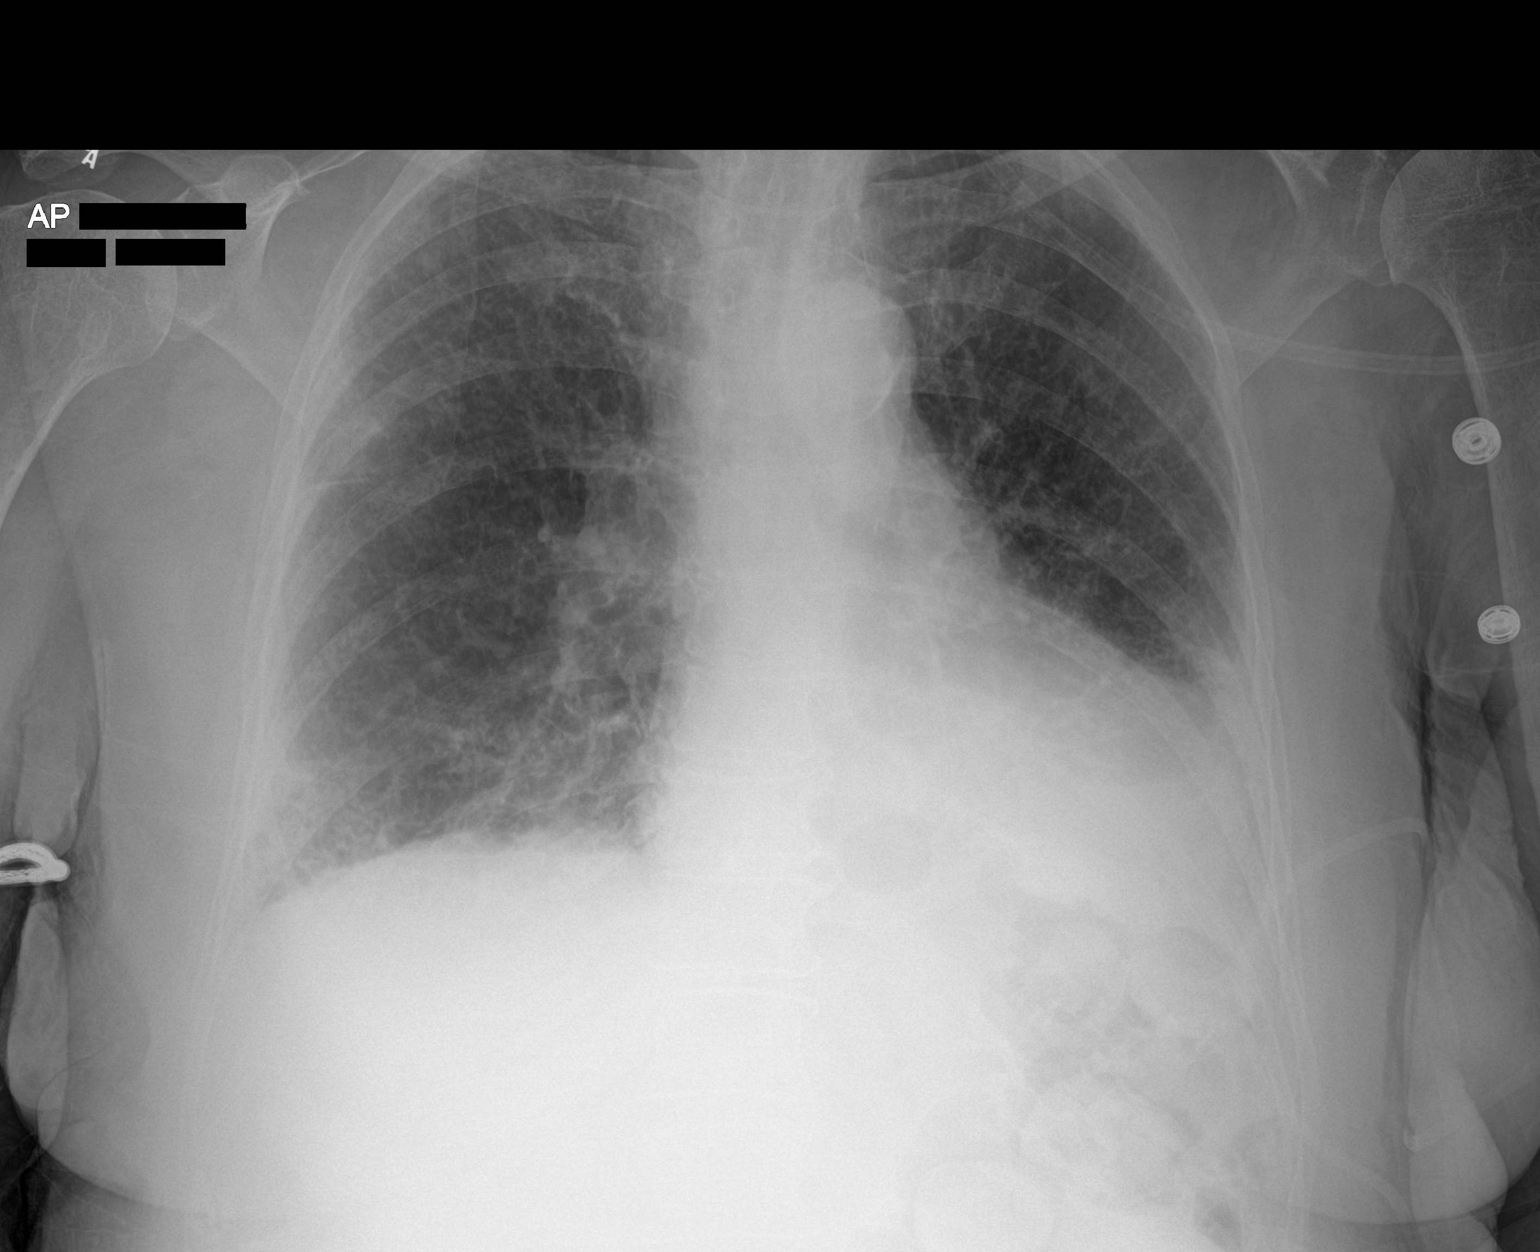

[1 of 1 positions shown; findings below may reference images not displayed]

FINDINGS: The heart size and mediastinal contours are unchanged. Aortic arch
calcification.

Interval increase in retrocardiac opacity. Likely trace bilateral
pleural effusion. Redemonstration of bilateral lower lung zone
patchy airspace opacity. Redemonstration of coarsened and increased
interstitial markings. Biapical pleural/pulmonary scarring. No
pneumothorax.

No acute osseous abnormality.
IMPRESSION: 1. Multifocal pneumonia with worsened retrocardiac opacity.
2. Likely bilateral trace pleural effusions.

## 2021-04-03 IMAGING — DX DG CERVICAL SPINE 2 OR 3 VIEWS
3 series · 3 of 3 positions shown · non-contrast
Comparison: CT of the cervical spine from Sunday February, 2018

CLINICAL DATA: Cervical spine pain, worsening progressively. Severe
pain limiting evaluation.

EXAM:
CERVICAL SPINE - 2-3 VIEW

[c-spine lat]
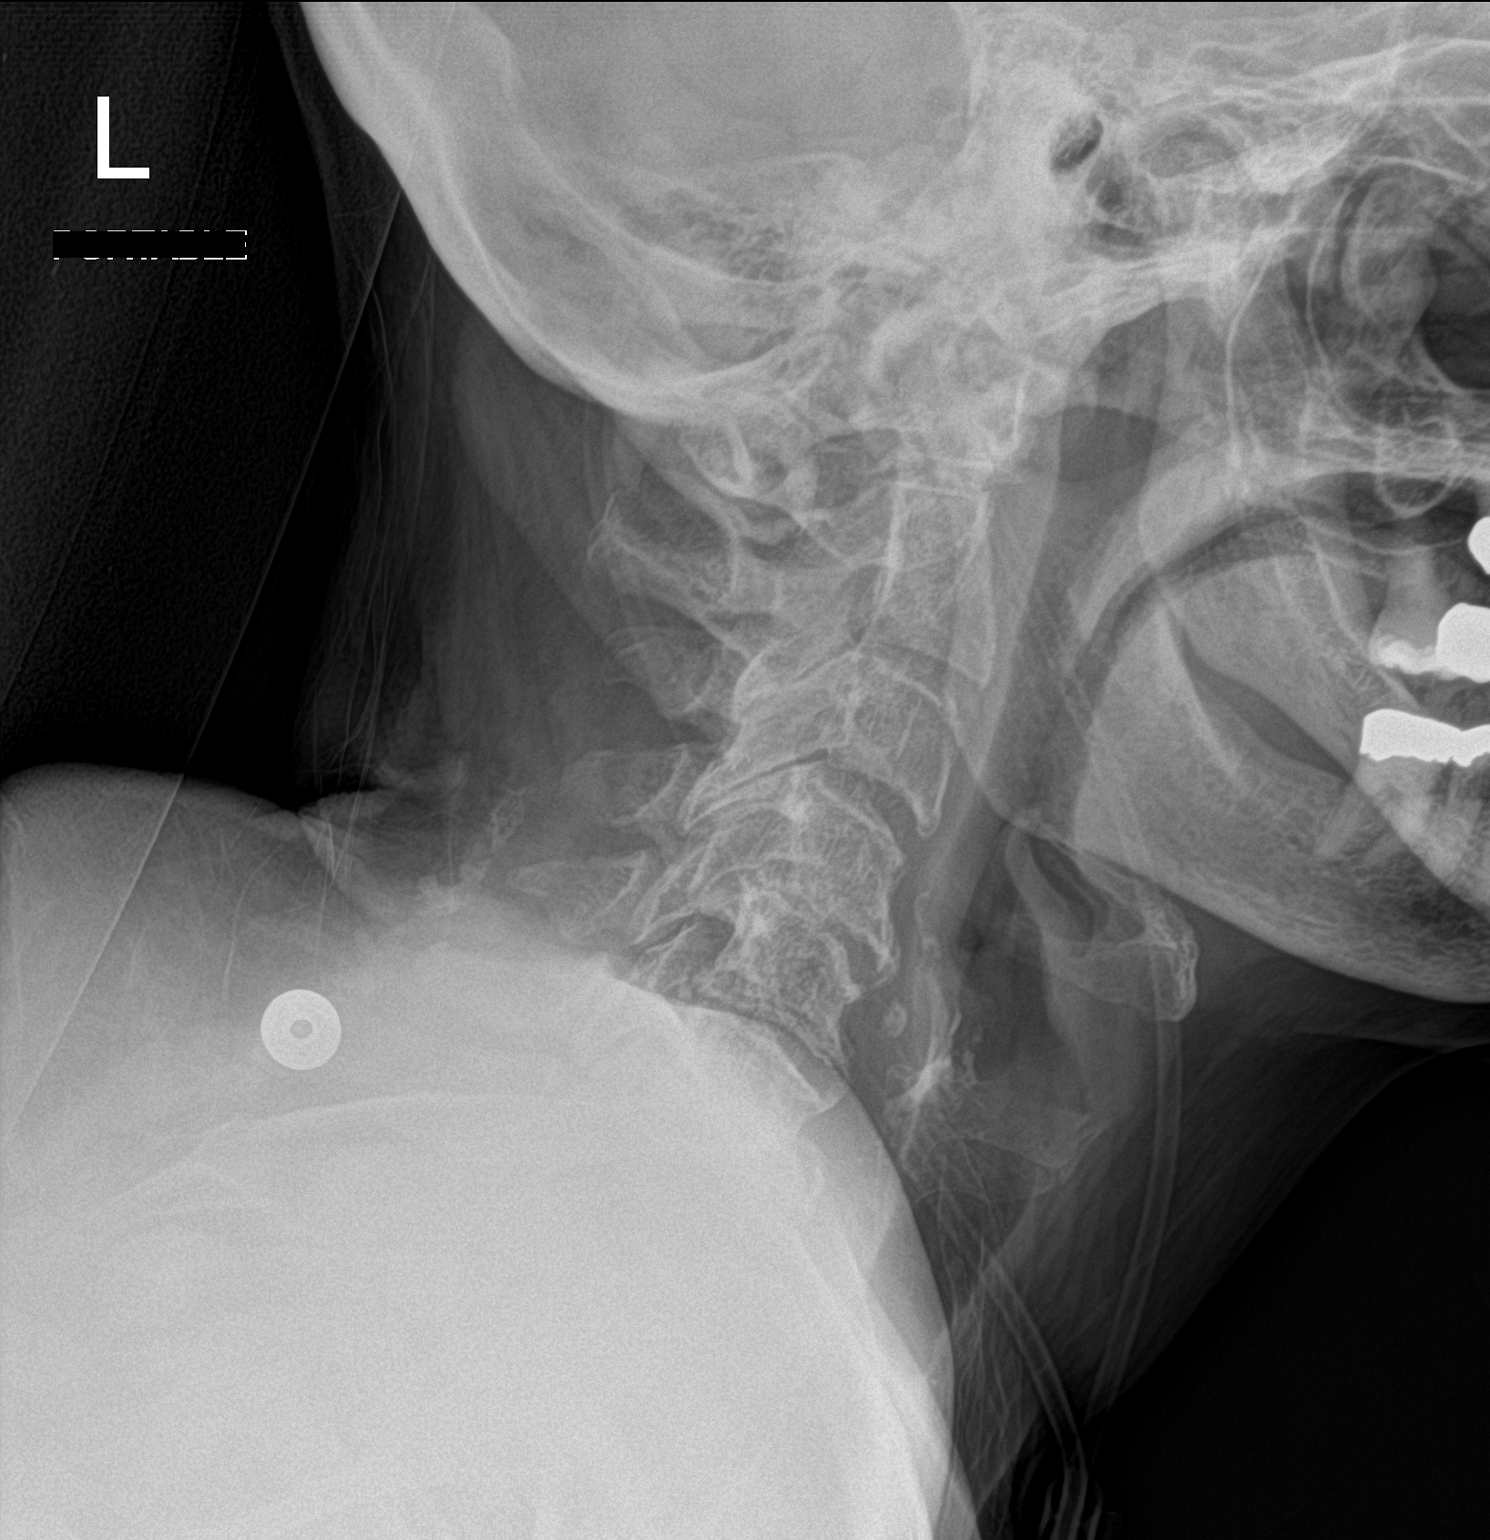

[c-spine ap]
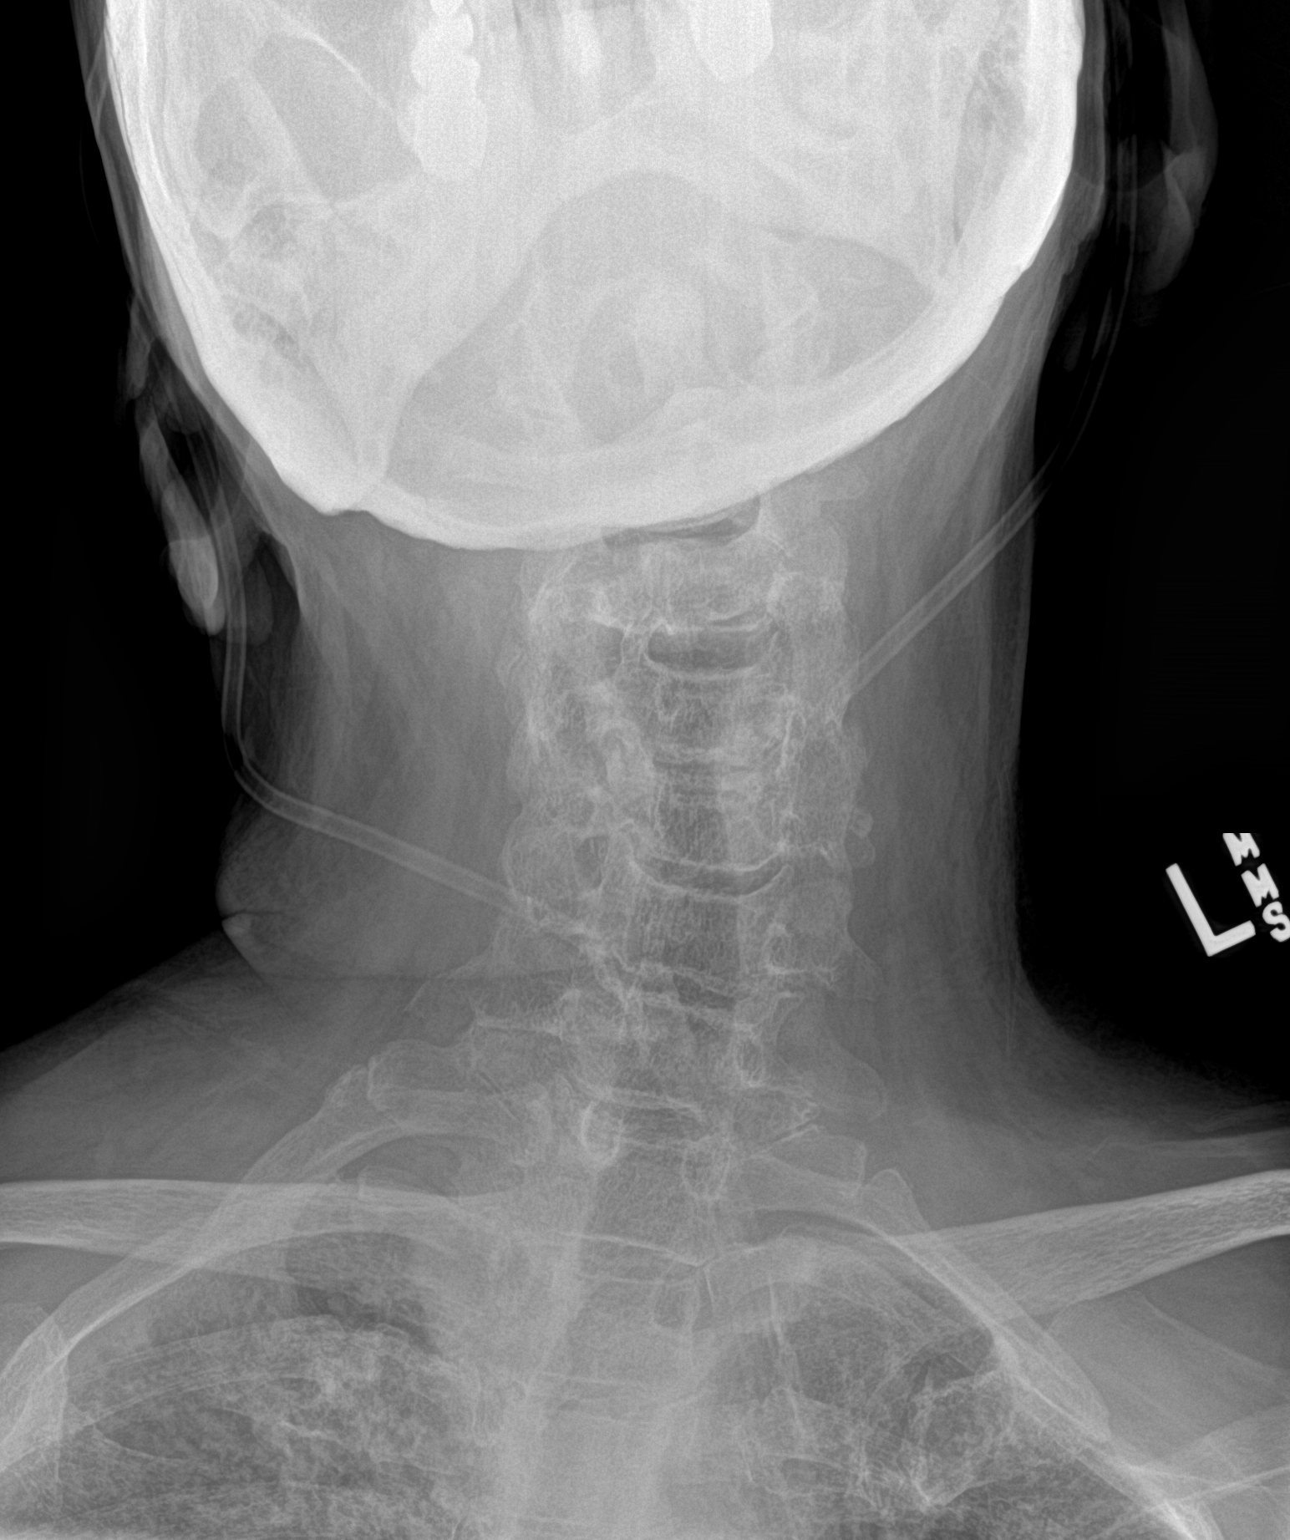

[c-spine open mouth]
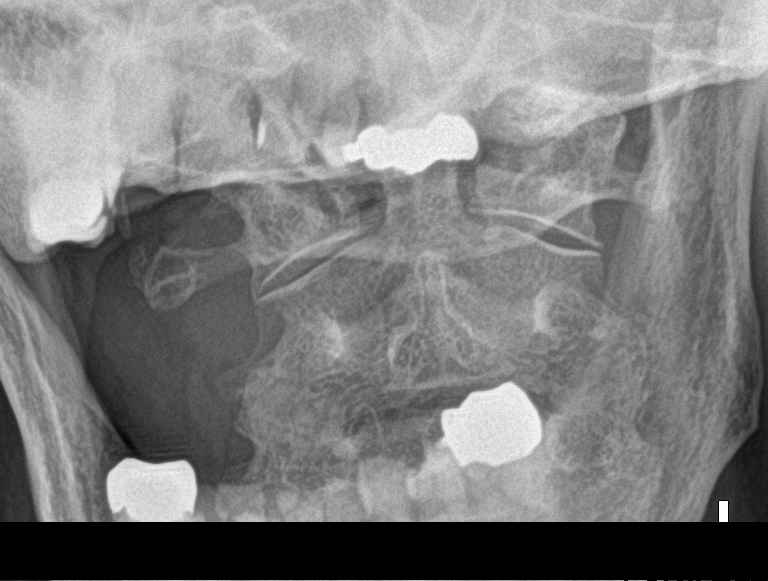

[3 of 3 positions shown; findings below may reference images not displayed]

FINDINGS: Very limited examination perhaps due to patient discomfort and
inability to position the patient in order to obtain adequate
C-spine evaluation.

Cervical spine imaged only through the midportion of C6 with marked
degenerative changes noted particularly at C4-5 and C5-6.

Calcification of the nuchal ligament with similar appearance.

Visualized prevertebral soft tissues are unremarkable.

Slight curvature to the RIGHT on frontal radiograph apparently
patient unable to straighten neck for the evaluation. There are
coarse interstitial markings noted at the lung apices also displayed
on recent chest radiograph. Examination is severely limited by
limitations related to patient positioning. In the setting of
worsening cervical spine pain CT may be helpful as standard
radiographs may not allow for complete coverage particularly in the
lateral view.
IMPRESSION: 1. Limited evaluation of the cervical spine perhaps due to patient
discomfort and inability to position the patient in order to obtain
adequate C-spine evaluation. CT may be helpful for further
evaluation of the cervical spine.
2. Multilevel degenerative disease in visualized portions of the
spine.
3. Coarse interstitial markings in the lung apices displayed on
recent chest radiograph.

## 2021-04-05 IMAGING — DX DG HUMERUS 2V *R*
2 series · 2 of 2 positions shown · non-contrast
Comparison: X-ray 05/01/2004

CLINICAL DATA: Palpable abnormality of the right upper arm. No
known recent injury.

EXAM:
RIGHT HUMERUS - 2+ VIEW

[humerus ap]
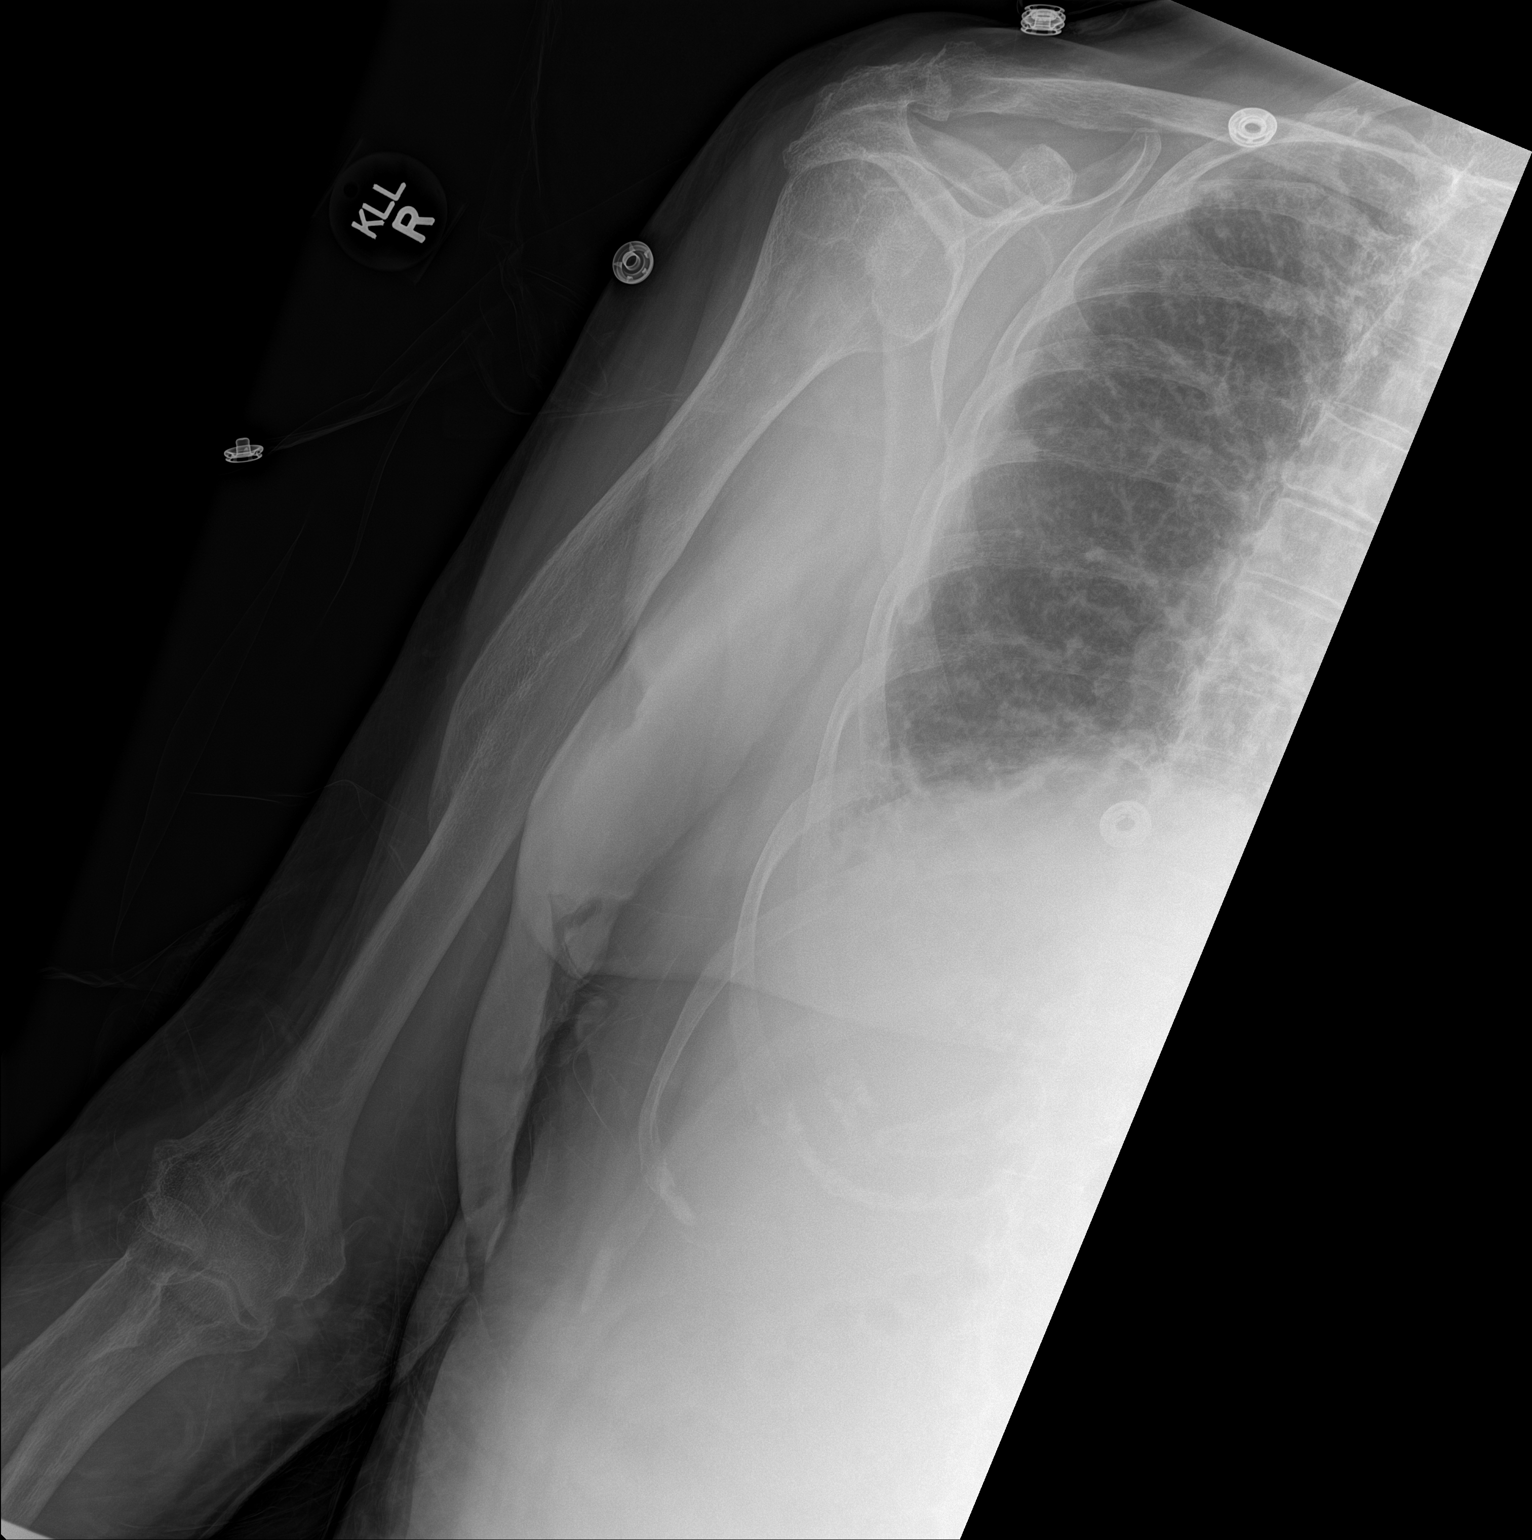

[humerus lat]
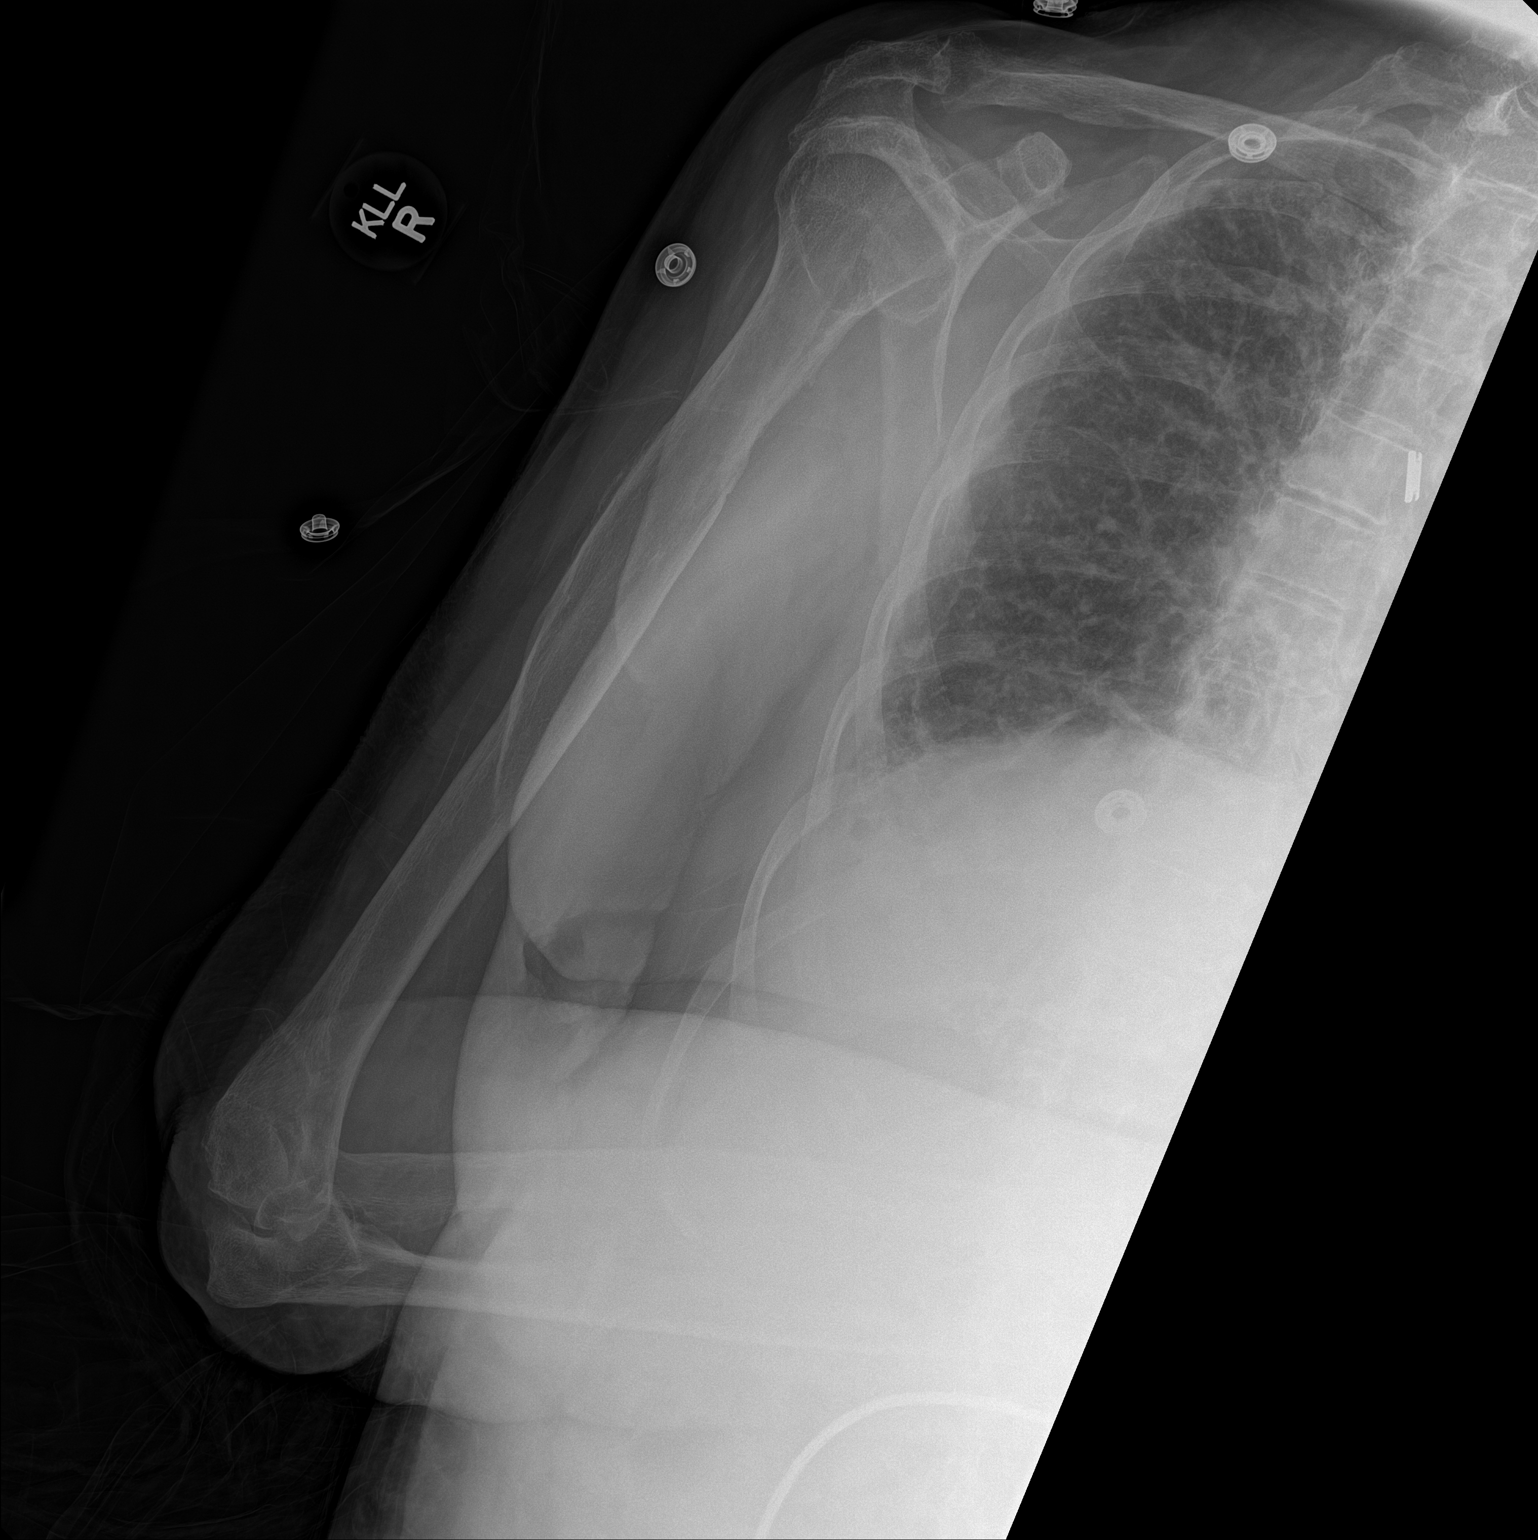

[2 of 2 positions shown; findings below may reference images not displayed]

FINDINGS: Previously seen displaced right humeral diaphyseal fracture is well
healed. No acute fracture or dislocation. Degenerative changes of
the shoulder and elbow. Soft tissues appear within normal limits.
IMPRESSION: Well healed remote right humeral fracture. This may correspond to
reported palpable abnormality. No acute osseous findings.

## 2021-05-23 IMAGING — CT CT ABD-PELV W/ CM
2 of 5 series · 11 of 46 positions shown, 12 images · IV contrast (iopamidol)
Comparison: CT abdomen pelvis dated 02/28/2020.

CLINICAL DATA: [AGE] female with abdominal pain.

EXAM:
CT ABDOMEN AND PELVIS WITH CONTRAST
TECHNIQUE: Multidetector CT imaging of the abdomen and pelvis was performed
using the standard protocol following bolus administration of
intravenous contrast.
CONTRAST:  100mL [id number] IOPAMIDOL (DPKOGN-J88) INJECTION 61%

[Series 2: abd pelvis 5.00 br40 s3 axial · axial · 0.50mm/px · z∈[+1250,+1645]mm · 8 of 99 slices shown, 9 images]
[im 10/99  soft-tissue]
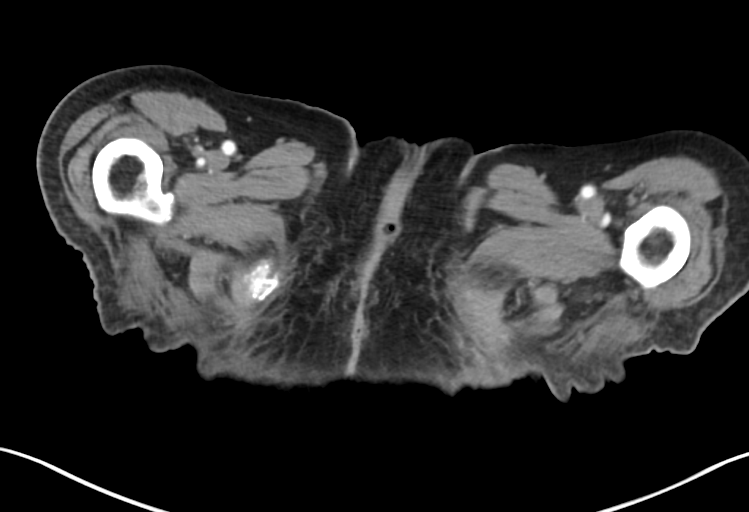
[im 10/99  bone]
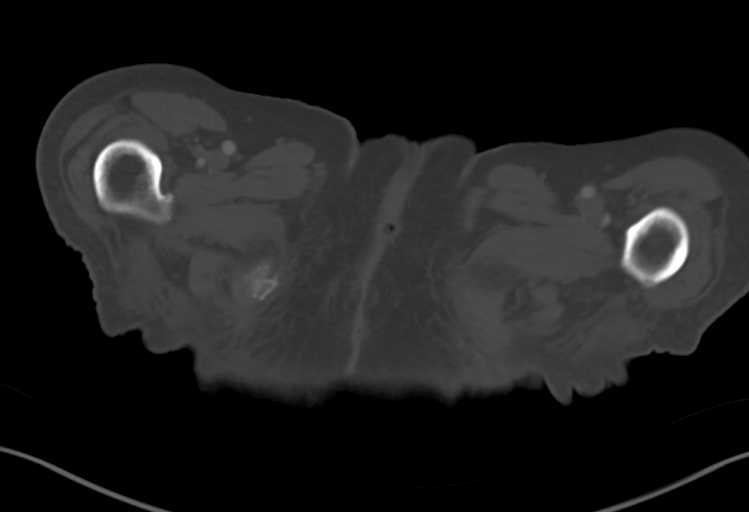
[im 20/99  soft-tissue]
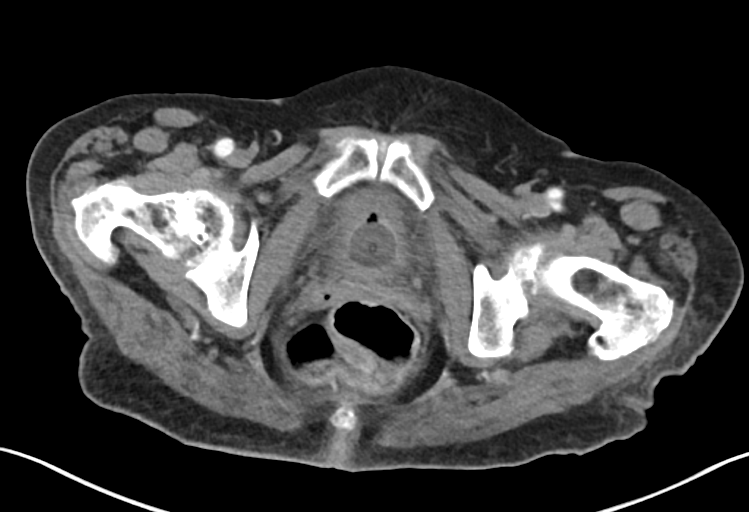
[im 30/99  soft-tissue]
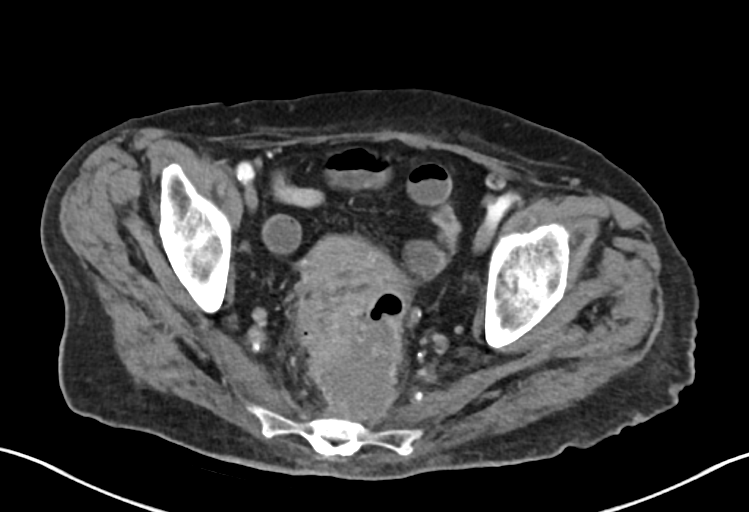
[im 45/99  soft-tissue]
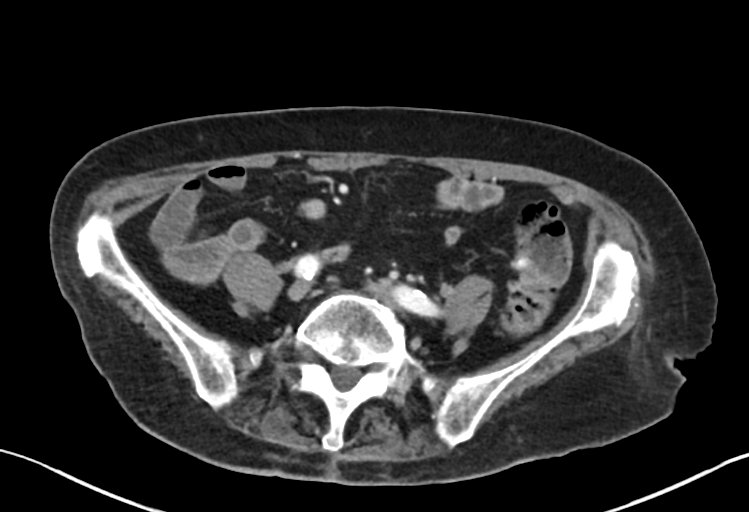
[im 54/99  soft-tissue]
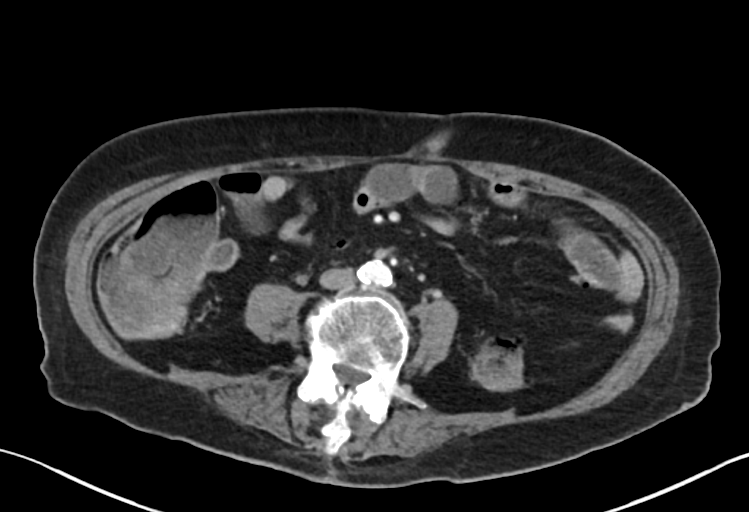
[im 69/99  soft-tissue]
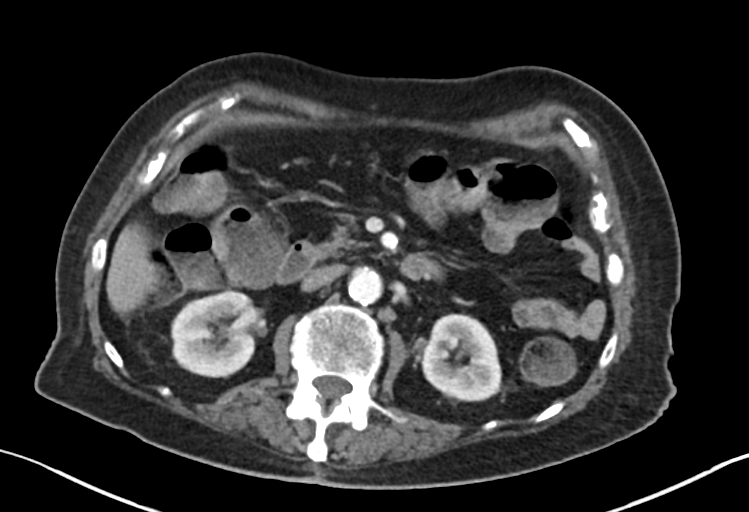
[im 79/99  soft-tissue]
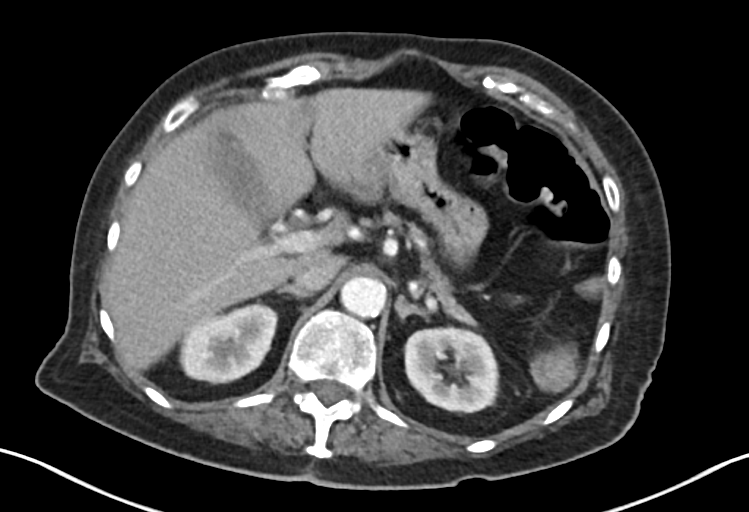
[im 89/99  soft-tissue]
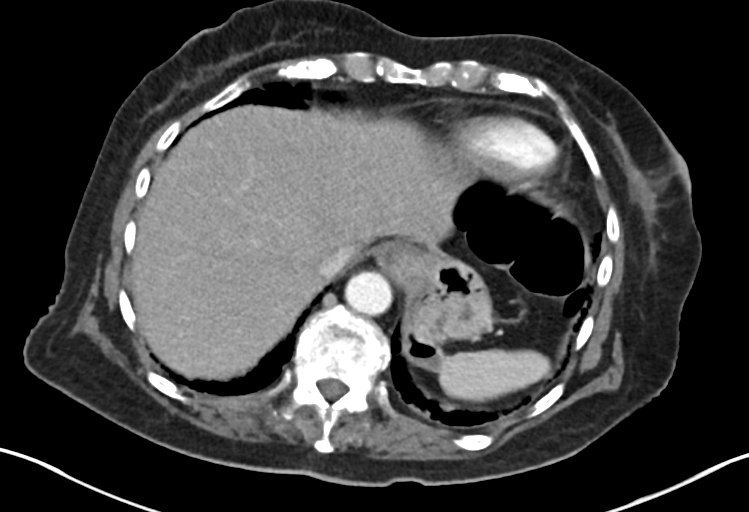

[Series 6: abd pelvis 2.00 br40 s3 cor · coronal · 0.73mm/px · 3 of 123 slices shown]
[im 41/123  soft-tissue]
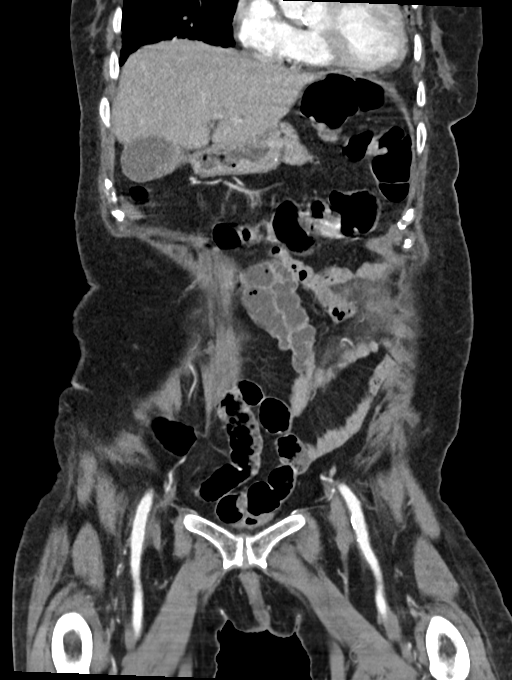
[im 55/123  soft-tissue]
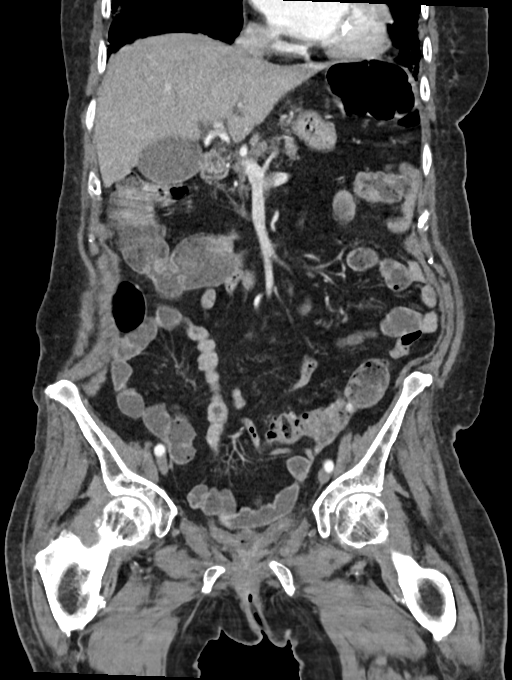
[im 68/123  soft-tissue]
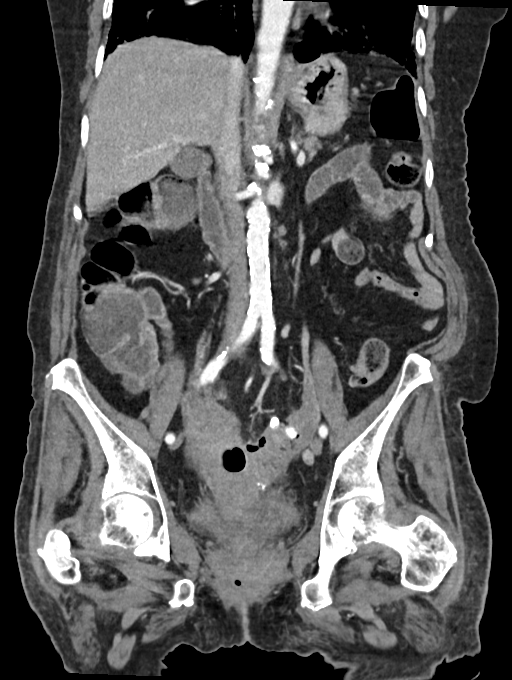

[11 of 46 positions shown; findings below may reference images not displayed]

FINDINGS: Lower chest: Emphysematous changes and chronic interstitial
coarsening of the visualized lung bases. There is coronary vascular
calcification.

No intra-abdominal free air or free fluid.

Hepatobiliary: Subcentimeter right hepatic hypodense focus is too
small to characterize. Probable mild fatty liver. No intrahepatic
biliary ductal dilatation. The gallbladder is unremarkable.

Pancreas: The pancreas is atrophic.  No active inflammatory changes.

Spleen: Normal in size without focal abnormality.

Adrenals/Urinary Tract: The adrenal glands unremarkable. There is no
hydronephrosis on either side. There is symmetric enhancement and
excretion of contrast by both kidneys. The visualized ureters appear
unremarkable. The urinary bladder is decompressed around a Foley
catheter.

Stomach/Bowel: There is sigmoid diverticulosis with muscular
hypertrophy. There is a complex collection in the posterior pelvis
containing air and fluid which appear contiguous with the sigmoid
colon most consistent with diverticular abscess. This collection
measures approximately 5.5 x 7.0 cm in greatest axial dimensions
(68/2). Overall there has been interval increase in the size of this
collection compared to the prior CT. This collection abuts the
posterior uterus and extends posteriorly to the presacral space.

There is no bowel obstruction.  Appendectomy.

Vascular/Lymphatic: Advanced aortoiliac atherosclerotic disease. The
IVC is unremarkable. No portal venous gas. There is no adenopathy.

Reproductive: The uterus is grossly unremarkable.

Other: None

Musculoskeletal: Osteopenia with degenerative changes of the spine.
No acute osseous pathology.
IMPRESSION: 1. Interval increase in the size of the pelvic diverticular abscess.
2. Aortic Atherosclerosis ([id number]-418.8).
